# Patient Record
Sex: Female | Born: 1945 | Race: White | Hispanic: No | Smoking: Former smoker
Health system: Southern US, Community
[De-identification: ages and names within clinical notes are randomized; demographics above are authoritative.]

## PROBLEM LIST (undated history)

## (undated) DIAGNOSIS — E785 Hyperlipidemia, unspecified: Secondary | ICD-10-CM

## (undated) DIAGNOSIS — K5732 Diverticulitis of large intestine without perforation or abscess without bleeding: Secondary | ICD-10-CM

## (undated) DIAGNOSIS — G8929 Other chronic pain: Secondary | ICD-10-CM

## (undated) DIAGNOSIS — Z955 Presence of coronary angioplasty implant and graft: Secondary | ICD-10-CM

## (undated) DIAGNOSIS — Z8719 Personal history of other diseases of the digestive system: Secondary | ICD-10-CM

## (undated) DIAGNOSIS — F32A Depression, unspecified: Secondary | ICD-10-CM

## (undated) DIAGNOSIS — R9439 Abnormal result of other cardiovascular function study: Secondary | ICD-10-CM

## (undated) DIAGNOSIS — M51369 Other intervertebral disc degeneration, lumbar region without mention of lumbar back pain or lower extremity pain: Secondary | ICD-10-CM

## (undated) DIAGNOSIS — K649 Unspecified hemorrhoids: Secondary | ICD-10-CM

## (undated) DIAGNOSIS — K4090 Unilateral inguinal hernia, without obstruction or gangrene, not specified as recurrent: Secondary | ICD-10-CM

## (undated) DIAGNOSIS — F329 Major depressive disorder, single episode, unspecified: Secondary | ICD-10-CM

## (undated) DIAGNOSIS — D519 Vitamin B12 deficiency anemia, unspecified: Secondary | ICD-10-CM

## (undated) DIAGNOSIS — G4730 Sleep apnea, unspecified: Secondary | ICD-10-CM

## (undated) DIAGNOSIS — J40 Bronchitis, not specified as acute or chronic: Secondary | ICD-10-CM

## (undated) DIAGNOSIS — E039 Hypothyroidism, unspecified: Secondary | ICD-10-CM

## (undated) DIAGNOSIS — M659 Synovitis and tenosynovitis, unspecified: Secondary | ICD-10-CM

## (undated) DIAGNOSIS — M5030 Other cervical disc degeneration, unspecified cervical region: Secondary | ICD-10-CM

## (undated) DIAGNOSIS — M792 Neuralgia and neuritis, unspecified: Secondary | ICD-10-CM

## (undated) DIAGNOSIS — J42 Unspecified chronic bronchitis: Secondary | ICD-10-CM

## (undated) DIAGNOSIS — F419 Anxiety disorder, unspecified: Secondary | ICD-10-CM

## (undated) DIAGNOSIS — M542 Cervicalgia: Secondary | ICD-10-CM

## (undated) DIAGNOSIS — I10 Essential (primary) hypertension: Secondary | ICD-10-CM

## (undated) DIAGNOSIS — J189 Pneumonia, unspecified organism: Secondary | ICD-10-CM

## (undated) DIAGNOSIS — M549 Dorsalgia, unspecified: Secondary | ICD-10-CM

## (undated) DIAGNOSIS — K219 Gastro-esophageal reflux disease without esophagitis: Secondary | ICD-10-CM

## (undated) DIAGNOSIS — Z87440 Personal history of urinary (tract) infections: Secondary | ICD-10-CM

## (undated) DIAGNOSIS — M1990 Unspecified osteoarthritis, unspecified site: Secondary | ICD-10-CM

## (undated) DIAGNOSIS — M65969 Unspecified synovitis and tenosynovitis, unspecified lower leg: Secondary | ICD-10-CM

## (undated) DIAGNOSIS — M5136 Other intervertebral disc degeneration, lumbar region: Secondary | ICD-10-CM

## (undated) DIAGNOSIS — I2510 Atherosclerotic heart disease of native coronary artery without angina pectoris: Secondary | ICD-10-CM

## (undated) DIAGNOSIS — R32 Unspecified urinary incontinence: Secondary | ICD-10-CM

## (undated) HISTORY — PX: JOINT REPLACEMENT: SHX530

## (undated) HISTORY — PX: EYE SURGERY: SHX253

## (undated) HISTORY — PX: TOTAL KNEE ARTHROPLASTY: SHX125

## (undated) HISTORY — PX: COLON SURGERY: SHX602

## (undated) HISTORY — PX: KNEE ARTHROSCOPY: SUR90

## (undated) HISTORY — PX: KNEE CLOSED REDUCTION: SHX995

## (undated) HISTORY — PX: TOTAL KNEE REVISION: SHX996

---

## 1988-07-25 HISTORY — PX: OTHER SURGICAL HISTORY: SHX169

## 1991-07-26 HISTORY — PX: APPENDECTOMY: SHX54

## 2000-07-25 HISTORY — PX: HYSTEROSCOPY WITH D & C: SHX1775

## 2000-10-05 ENCOUNTER — Encounter: Admission: RE | Admit: 2000-10-05 | Discharge: 2000-10-05 | Payer: Self-pay | Admitting: Family Medicine

## 2000-10-05 ENCOUNTER — Encounter: Payer: Self-pay | Admitting: Family Medicine

## 2000-12-06 ENCOUNTER — Encounter: Admission: RE | Admit: 2000-12-06 | Discharge: 2000-12-19 | Payer: Self-pay | Admitting: Neurological Surgery

## 2001-02-13 ENCOUNTER — Ambulatory Visit (HOSPITAL_COMMUNITY): Admission: RE | Admit: 2001-02-13 | Discharge: 2001-02-13 | Payer: Self-pay | Admitting: Obstetrics and Gynecology

## 2001-02-13 ENCOUNTER — Encounter (INDEPENDENT_AMBULATORY_CARE_PROVIDER_SITE_OTHER): Payer: Self-pay | Admitting: Specialist

## 2002-07-05 ENCOUNTER — Encounter: Payer: Self-pay | Admitting: Family Medicine

## 2002-07-05 ENCOUNTER — Ambulatory Visit (HOSPITAL_COMMUNITY): Admission: RE | Admit: 2002-07-05 | Discharge: 2002-07-05 | Payer: Self-pay | Admitting: Family Medicine

## 2002-07-25 HISTORY — PX: CORONARY ANGIOPLASTY: SHX604

## 2002-08-28 ENCOUNTER — Encounter: Admission: RE | Admit: 2002-08-28 | Discharge: 2002-09-24 | Payer: Self-pay | Admitting: Orthopedic Surgery

## 2003-04-21 ENCOUNTER — Encounter: Payer: Self-pay | Admitting: Cardiology

## 2003-04-21 ENCOUNTER — Ambulatory Visit (HOSPITAL_COMMUNITY): Admission: RE | Admit: 2003-04-21 | Discharge: 2003-04-22 | Payer: Self-pay | Admitting: Cardiology

## 2003-06-11 ENCOUNTER — Ambulatory Visit (HOSPITAL_COMMUNITY): Admission: RE | Admit: 2003-06-11 | Discharge: 2003-06-11 | Payer: Self-pay | Admitting: Family Medicine

## 2003-07-26 DIAGNOSIS — I2510 Atherosclerotic heart disease of native coronary artery without angina pectoris: Secondary | ICD-10-CM

## 2003-07-26 HISTORY — PX: OTHER SURGICAL HISTORY: SHX169

## 2003-07-26 HISTORY — DX: Atherosclerotic heart disease of native coronary artery without angina pectoris: I25.10

## 2003-11-26 ENCOUNTER — Encounter: Admission: RE | Admit: 2003-11-26 | Discharge: 2003-12-23 | Payer: Self-pay | Admitting: Orthopedic Surgery

## 2004-05-25 ENCOUNTER — Inpatient Hospital Stay (HOSPITAL_COMMUNITY): Admission: RE | Admit: 2004-05-25 | Discharge: 2004-05-28 | Payer: Self-pay | Admitting: Orthopedic Surgery

## 2004-06-08 ENCOUNTER — Encounter: Admission: RE | Admit: 2004-06-08 | Discharge: 2004-08-17 | Payer: Self-pay | Admitting: Orthopedic Surgery

## 2004-11-29 ENCOUNTER — Ambulatory Visit: Payer: Self-pay | Admitting: Family Medicine

## 2004-12-06 ENCOUNTER — Encounter: Admission: RE | Admit: 2004-12-06 | Discharge: 2005-01-27 | Payer: Self-pay | Admitting: Orthopedic Surgery

## 2004-12-29 ENCOUNTER — Ambulatory Visit: Payer: Self-pay | Admitting: Family Medicine

## 2005-02-08 ENCOUNTER — Encounter: Admission: RE | Admit: 2005-02-08 | Discharge: 2005-05-09 | Payer: Self-pay | Admitting: Orthopedic Surgery

## 2005-03-23 ENCOUNTER — Ambulatory Visit: Payer: Self-pay | Admitting: Cardiology

## 2005-04-01 ENCOUNTER — Ambulatory Visit: Payer: Self-pay

## 2005-04-06 ENCOUNTER — Ambulatory Visit: Payer: Self-pay | Admitting: Family Medicine

## 2005-05-09 ENCOUNTER — Ambulatory Visit: Payer: Self-pay | Admitting: Family Medicine

## 2005-06-22 ENCOUNTER — Ambulatory Visit: Payer: Self-pay | Admitting: Family Medicine

## 2005-08-24 ENCOUNTER — Ambulatory Visit: Payer: Self-pay | Admitting: Family Medicine

## 2005-11-28 ENCOUNTER — Ambulatory Visit: Payer: Self-pay | Admitting: Family Medicine

## 2006-01-09 ENCOUNTER — Ambulatory Visit: Payer: Self-pay | Admitting: Family Medicine

## 2006-03-13 ENCOUNTER — Ambulatory Visit: Payer: Self-pay | Admitting: Family Medicine

## 2006-05-22 ENCOUNTER — Ambulatory Visit: Payer: Self-pay | Admitting: Family Medicine

## 2006-10-09 ENCOUNTER — Ambulatory Visit: Payer: Self-pay | Admitting: Family Medicine

## 2007-01-15 ENCOUNTER — Encounter: Admission: RE | Admit: 2007-01-15 | Discharge: 2007-02-16 | Payer: Self-pay | Admitting: Orthopedic Surgery

## 2007-05-09 ENCOUNTER — Ambulatory Visit: Payer: Self-pay | Admitting: Cardiology

## 2007-05-15 ENCOUNTER — Ambulatory Visit (HOSPITAL_COMMUNITY): Admission: RE | Admit: 2007-05-15 | Discharge: 2007-05-15 | Payer: Self-pay | Admitting: Surgery

## 2007-05-17 ENCOUNTER — Ambulatory Visit: Payer: Self-pay

## 2007-05-18 ENCOUNTER — Ambulatory Visit: Payer: Self-pay

## 2007-06-26 ENCOUNTER — Encounter: Admission: RE | Admit: 2007-06-26 | Discharge: 2007-06-26 | Payer: Self-pay | Admitting: Surgery

## 2007-06-28 ENCOUNTER — Ambulatory Visit (HOSPITAL_COMMUNITY): Admission: RE | Admit: 2007-06-28 | Discharge: 2007-06-28 | Payer: Self-pay | Admitting: Surgery

## 2009-01-06 DIAGNOSIS — Z8719 Personal history of other diseases of the digestive system: Secondary | ICD-10-CM | POA: Insufficient documentation

## 2009-01-06 DIAGNOSIS — E039 Hypothyroidism, unspecified: Secondary | ICD-10-CM | POA: Insufficient documentation

## 2009-01-06 DIAGNOSIS — K219 Gastro-esophageal reflux disease without esophagitis: Secondary | ICD-10-CM | POA: Insufficient documentation

## 2009-01-06 DIAGNOSIS — I2510 Atherosclerotic heart disease of native coronary artery without angina pectoris: Secondary | ICD-10-CM | POA: Insufficient documentation

## 2009-01-06 DIAGNOSIS — E785 Hyperlipidemia, unspecified: Secondary | ICD-10-CM | POA: Insufficient documentation

## 2009-01-06 DIAGNOSIS — R0602 Shortness of breath: Secondary | ICD-10-CM | POA: Insufficient documentation

## 2009-01-06 DIAGNOSIS — I10 Essential (primary) hypertension: Secondary | ICD-10-CM | POA: Insufficient documentation

## 2009-01-06 DIAGNOSIS — G4730 Sleep apnea, unspecified: Secondary | ICD-10-CM | POA: Insufficient documentation

## 2009-01-09 ENCOUNTER — Ambulatory Visit: Payer: Self-pay | Admitting: Internal Medicine

## 2009-01-09 ENCOUNTER — Encounter: Payer: Self-pay | Admitting: Nurse Practitioner

## 2009-01-09 DIAGNOSIS — M1990 Unspecified osteoarthritis, unspecified site: Secondary | ICD-10-CM | POA: Insufficient documentation

## 2009-01-21 ENCOUNTER — Telehealth (INDEPENDENT_AMBULATORY_CARE_PROVIDER_SITE_OTHER): Payer: Self-pay | Admitting: *Deleted

## 2009-01-22 ENCOUNTER — Encounter: Payer: Self-pay | Admitting: Cardiology

## 2009-01-22 ENCOUNTER — Ambulatory Visit: Payer: Self-pay

## 2009-01-22 DIAGNOSIS — R9439 Abnormal result of other cardiovascular function study: Secondary | ICD-10-CM

## 2009-01-22 HISTORY — DX: Abnormal result of other cardiovascular function study: R94.39

## 2009-01-27 ENCOUNTER — Ambulatory Visit: Payer: Self-pay

## 2009-02-04 ENCOUNTER — Telehealth: Payer: Self-pay | Admitting: Cardiology

## 2009-03-05 ENCOUNTER — Encounter: Admission: RE | Admit: 2009-03-05 | Discharge: 2009-03-05 | Payer: Self-pay | Admitting: Neurological Surgery

## 2009-03-09 ENCOUNTER — Telehealth (INDEPENDENT_AMBULATORY_CARE_PROVIDER_SITE_OTHER): Payer: Self-pay | Admitting: *Deleted

## 2009-03-10 ENCOUNTER — Telehealth (INDEPENDENT_AMBULATORY_CARE_PROVIDER_SITE_OTHER): Payer: Self-pay | Admitting: *Deleted

## 2009-03-13 ENCOUNTER — Inpatient Hospital Stay (HOSPITAL_COMMUNITY): Admission: RE | Admit: 2009-03-13 | Discharge: 2009-03-16 | Payer: Self-pay | Admitting: Orthopedic Surgery

## 2009-04-06 ENCOUNTER — Encounter: Admission: RE | Admit: 2009-04-06 | Discharge: 2009-07-05 | Payer: Self-pay | Admitting: Orthopedic Surgery

## 2009-04-28 ENCOUNTER — Encounter: Admission: RE | Admit: 2009-04-28 | Discharge: 2009-04-28 | Payer: Self-pay | Admitting: Orthopedic Surgery

## 2009-05-04 ENCOUNTER — Inpatient Hospital Stay (HOSPITAL_COMMUNITY): Admission: RE | Admit: 2009-05-04 | Discharge: 2009-05-05 | Payer: Self-pay | Admitting: Orthopedic Surgery

## 2009-05-04 HISTORY — PX: OTHER SURGICAL HISTORY: SHX169

## 2009-05-07 ENCOUNTER — Inpatient Hospital Stay (HOSPITAL_COMMUNITY): Admission: RE | Admit: 2009-05-07 | Discharge: 2009-05-12 | Payer: Self-pay | Admitting: Orthopedic Surgery

## 2009-09-30 ENCOUNTER — Inpatient Hospital Stay (HOSPITAL_COMMUNITY): Admission: RE | Admit: 2009-09-30 | Discharge: 2009-10-04 | Payer: Self-pay | Admitting: Orthopedic Surgery

## 2009-11-25 ENCOUNTER — Inpatient Hospital Stay (HOSPITAL_COMMUNITY): Admission: RE | Admit: 2009-11-25 | Discharge: 2009-11-28 | Payer: Self-pay | Admitting: Orthopedic Surgery

## 2009-12-16 ENCOUNTER — Encounter: Admission: RE | Admit: 2009-12-16 | Discharge: 2010-03-16 | Payer: Self-pay | Admitting: Orthopedic Surgery

## 2010-01-17 ENCOUNTER — Emergency Department (HOSPITAL_COMMUNITY): Admission: EM | Admit: 2010-01-17 | Discharge: 2010-01-17 | Payer: Self-pay | Admitting: Emergency Medicine

## 2010-01-22 ENCOUNTER — Inpatient Hospital Stay (HOSPITAL_COMMUNITY): Admission: AD | Admit: 2010-01-22 | Discharge: 2010-01-26 | Payer: Self-pay | Admitting: Orthopedic Surgery

## 2010-01-22 HISTORY — PX: KNEE ARTHROSCOPY W/ DEBRIDEMENT: SHX1867

## 2010-02-04 ENCOUNTER — Inpatient Hospital Stay (HOSPITAL_COMMUNITY): Admission: RE | Admit: 2010-02-04 | Discharge: 2010-02-08 | Payer: Self-pay | Admitting: Orthopedic Surgery

## 2010-02-04 HISTORY — PX: OTHER SURGICAL HISTORY: SHX169

## 2010-08-15 ENCOUNTER — Encounter: Payer: Self-pay | Admitting: Orthopedic Surgery

## 2010-10-09 LAB — CBC
HCT: 28.9 % — ABNORMAL LOW (ref 36.0–46.0)
MCH: 24.7 pg — ABNORMAL LOW (ref 26.0–34.0)
MCV: 74.7 fL — ABNORMAL LOW (ref 78.0–100.0)
Platelets: 586 10*3/uL — ABNORMAL HIGH (ref 150–400)
RDW: 16.7 % — ABNORMAL HIGH (ref 11.5–15.5)
WBC: 8.6 10*3/uL (ref 4.0–10.5)

## 2010-10-09 LAB — BASIC METABOLIC PANEL
BUN: 8 mg/dL (ref 6–23)
BUN: 9 mg/dL (ref 6–23)
Chloride: 102 mEq/L (ref 96–112)
Chloride: 103 mEq/L (ref 96–112)
Creatinine, Ser: 0.72 mg/dL (ref 0.4–1.2)
GFR calc Af Amer: >60 mL/min (ref >60)
GFR calc Af Amer: >60 mL/min (ref >60)
Glucose, Bld: 131 mg/dL — ABNORMAL HIGH (ref 70–99)
Potassium: 3.8 mEq/L (ref 3.5–5.1)
Potassium: 4.1 mEq/L (ref 3.5–5.1)
Sodium: 136 mEq/L (ref 135–145)
Sodium: 136 mEq/L (ref 135–145)

## 2010-10-10 LAB — URINALYSIS, ROUTINE W REFLEX MICROSCOPIC
Bilirubin Urine: NEGATIVE
Glucose, UA: NEGATIVE mg/dL
Hgb urine dipstick: NEGATIVE
Nitrite: NEGATIVE
Protein, ur: 30 mg/dL — AB
Specific Gravity, Urine: 1.021 (ref 1.005–1.030)
Specific Gravity, Urine: 1.041 — ABNORMAL HIGH (ref 1.005–1.030)
Urobilinogen, UA: 1 mg/dL (ref 0.0–1.0)
pH: 5.5 (ref 5.0–8.0)
pH: 6 (ref 5.0–8.0)

## 2010-10-10 LAB — BASIC METABOLIC PANEL
BUN: 13 mg/dL (ref 6–23)
BUN: 5 mg/dL — ABNORMAL LOW (ref 6–23)
CO2: 24 mEq/L (ref 19–32)
CO2: 25 mEq/L (ref 19–32)
CO2: 25 mEq/L (ref 19–32)
Calcium: 9.1 mg/dL (ref 8.4–10.5)
Calcium: 9.3 mg/dL (ref 8.4–10.5)
Chloride: 107 mEq/L (ref 96–112)
Chloride: 108 mEq/L (ref 96–112)
Creatinine, Ser: 0.73 mg/dL (ref 0.4–1.2)
GFR calc Af Amer: >60 mL/min (ref >60)
GFR calc Af Amer: >60 mL/min (ref >60)
GFR calc non Af Amer: >60 mL/min (ref >60)
Glucose, Bld: 116 mg/dL — ABNORMAL HIGH (ref 70–99)
Glucose, Bld: 119 mg/dL — ABNORMAL HIGH (ref 70–99)
Glucose, Bld: 114 mg/dL — ABNORMAL HIGH (ref 70–99)
Potassium: 3.7 mEq/L (ref 3.5–5.1)
Potassium: 4 mEq/L (ref 3.5–5.1)
Potassium: 4.1 mEq/L (ref 3.5–5.1)
Potassium: 4.2 mEq/L (ref 3.5–5.1)
Sodium: 137 mEq/L (ref 135–145)

## 2010-10-10 LAB — COMPREHENSIVE METABOLIC PANEL
ALT: 19 U/L (ref 0–35)
AST: 16 U/L (ref 0–37)
CO2: 26 mEq/L (ref 19–32)
Calcium: 9.2 mg/dL (ref 8.4–10.5)
Chloride: 105 mEq/L (ref 96–112)
GFR calc Af Amer: >60 mL/min (ref >60)
GFR calc non Af Amer: >60 mL/min (ref >60)
Glucose, Bld: 103 mg/dL — ABNORMAL HIGH (ref 70–99)
Sodium: 138 mEq/L (ref 135–145)
Total Bilirubin: 0.9 mg/dL (ref 0.3–1.2)

## 2010-10-10 LAB — DIFFERENTIAL
Basophils Absolute: 0.1 10*3/uL (ref 0.0–0.1)
Basophils Absolute: 0.1 10*3/uL (ref 0.0–0.1)
Basophils Relative: 1 % (ref 0–1)
Basophils Relative: 1 % (ref 0–1)
Lymphocytes Relative: 20 % (ref 12–46)
Monocytes Absolute: 0.9 10*3/uL (ref 0.1–1.0)
Monocytes Relative: 11 % (ref 3–12)
Neutro Abs: 6.7 10*3/uL (ref 1.7–7.7)
Neutro Abs: 7.5 10*3/uL (ref 1.7–7.7)
Neutrophils Relative %: 71 % (ref 43–77)
Neutrophils Relative %: 65 % (ref 43–77)

## 2010-10-10 LAB — GRAM STAIN

## 2010-10-10 LAB — ANAEROBIC CULTURE

## 2010-10-10 LAB — CBC
HCT: 27.5 % — ABNORMAL LOW (ref 36.0–46.0)
HCT: 31.4 % — ABNORMAL LOW (ref 36.0–46.0)
HCT: 33 % — ABNORMAL LOW (ref 36.0–46.0)
Hemoglobin: 10.3 g/dL — ABNORMAL LOW (ref 12.0–15.0)
Hemoglobin: 10.3 g/dL — ABNORMAL LOW (ref 12.0–15.0)
Hemoglobin: 9 g/dL — ABNORMAL LOW (ref 12.0–15.0)
Hemoglobin: 11 g/dL — ABNORMAL LOW (ref 12.0–15.0)
MCH: 24.5 pg — ABNORMAL LOW (ref 26.0–34.0)
MCH: 25.1 pg — ABNORMAL LOW (ref 26.0–34.0)
MCHC: 32.8 g/dL (ref 30.0–36.0)
MCHC: 33.5 g/dL (ref 30.0–36.0)
MCHC: 33.3 g/dL (ref 30.0–36.0)
MCV: 76.6 fL — ABNORMAL LOW (ref 78.0–100.0)
RBC: 3.59 MIL/uL — ABNORMAL LOW (ref 3.87–5.11)
RBC: 4.2 MIL/uL (ref 3.87–5.11)
WBC: 7.4 10*3/uL (ref 4.0–10.5)
WBC: 11.5 10*3/uL — ABNORMAL HIGH (ref 4.0–10.5)

## 2010-10-10 LAB — URINE MICROSCOPIC-ADD ON

## 2010-10-10 LAB — URINE CULTURE: Special Requests: NEGATIVE

## 2010-10-10 LAB — TYPE AND SCREEN: ABO/RH(D): O POS

## 2010-10-10 LAB — VANCOMYCIN, TROUGH: Vancomycin Tr: 18.1 ug/mL (ref 10.0–20.0)

## 2010-10-10 LAB — WOUND CULTURE: Culture: NO GROWTH

## 2010-10-10 LAB — SYNOVIAL CELL COUNT + DIFF, W/ CRYSTALS: Eosinophils-Synovial: 0 % (ref 0–1)

## 2010-10-10 LAB — PROTIME-INR
INR: 1.15 (ref 0.00–1.49)
Prothrombin Time: 14.2 seconds (ref 11.6–15.2)

## 2010-10-10 LAB — SURGICAL PCR SCREEN: MRSA, PCR: NEGATIVE

## 2010-10-10 LAB — BODY FLUID CULTURE

## 2010-10-10 LAB — MRSA PCR SCREENING: MRSA by PCR: NEGATIVE

## 2010-10-12 LAB — URINALYSIS, ROUTINE W REFLEX MICROSCOPIC
Bilirubin Urine: NEGATIVE
Glucose, UA: NEGATIVE mg/dL
Ketones, ur: NEGATIVE mg/dL
Ketones, ur: NEGATIVE mg/dL
Nitrite: NEGATIVE
Nitrite: NEGATIVE
Protein, ur: NEGATIVE mg/dL
Specific Gravity, Urine: 1.006 (ref 1.005–1.030)
pH: 6.5 (ref 5.0–8.0)
pH: 5.5 (ref 5.0–8.0)

## 2010-10-12 LAB — CBC
HCT: 29.8 % — ABNORMAL LOW (ref 36.0–46.0)
HCT: 30.5 % — ABNORMAL LOW (ref 36.0–46.0)
HCT: 37.5 % (ref 36.0–46.0)
Hemoglobin: 10.1 g/dL — ABNORMAL LOW (ref 12.0–15.0)
Hemoglobin: 12.5 g/dL (ref 12.0–15.0)
MCHC: 33 g/dL (ref 30.0–36.0)
MCHC: 34 g/dL (ref 30.0–36.0)
MCHC: 33.2 g/dL (ref 30.0–36.0)
MCV: 83.1 fL (ref 78.0–100.0)
MCV: 83.3 fL (ref 78.0–100.0)
Platelets: 224 10*3/uL (ref 150–400)
RBC: 3.25 MIL/uL — ABNORMAL LOW (ref 3.87–5.11)
RBC: 3.67 MIL/uL — ABNORMAL LOW (ref 3.87–5.11)
RBC: 4.5 MIL/uL (ref 3.87–5.11)
RDW: 15.2 % (ref 11.5–15.5)
RDW: 15.2 % (ref 11.5–15.5)
RDW: 15.4 % (ref 11.5–15.5)

## 2010-10-12 LAB — PROTIME-INR
INR: 1.11 (ref 0.00–1.49)
INR: 1.66 — ABNORMAL HIGH (ref 0.00–1.49)
Prothrombin Time: 15.9 seconds — ABNORMAL HIGH (ref 11.6–15.2)
Prothrombin Time: 19.5 seconds — ABNORMAL HIGH (ref 11.6–15.2)
Prothrombin Time: 13.2 seconds (ref 11.6–15.2)

## 2010-10-12 LAB — COMPREHENSIVE METABOLIC PANEL
AST: 21 U/L (ref 0–37)
BUN: 8 mg/dL (ref 6–23)
CO2: 25 mEq/L (ref 19–32)
Calcium: 9.1 mg/dL (ref 8.4–10.5)
Chloride: 107 mEq/L (ref 96–112)
Creatinine, Ser: 0.77 mg/dL (ref 0.4–1.2)
GFR calc non Af Amer: >60 mL/min (ref >60)
Glucose, Bld: 102 mg/dL — ABNORMAL HIGH (ref 70–99)
Total Bilirubin: 0.5 mg/dL (ref 0.3–1.2)

## 2010-10-12 LAB — BASIC METABOLIC PANEL
BUN: 4 mg/dL — ABNORMAL LOW (ref 6–23)
CO2: 27 mEq/L (ref 19–32)
CO2: 27 mEq/L (ref 19–32)
Calcium: 8.6 mg/dL (ref 8.4–10.5)
Chloride: 103 mEq/L (ref 96–112)
Creatinine, Ser: 0.7 mg/dL (ref 0.4–1.2)
Creatinine, Ser: 0.75 mg/dL (ref 0.4–1.2)
GFR calc Af Amer: >60 mL/min (ref >60)
GFR calc Af Amer: >60 mL/min (ref >60)
GFR calc non Af Amer: >60 mL/min (ref >60)
Glucose, Bld: 129 mg/dL — ABNORMAL HIGH (ref 70–99)
Glucose, Bld: 150 mg/dL — ABNORMAL HIGH (ref 70–99)
Glucose, Bld: 178 mg/dL — ABNORMAL HIGH (ref 70–99)
Potassium: 5.1 mEq/L (ref 3.5–5.1)
Sodium: 134 mEq/L — ABNORMAL LOW (ref 135–145)

## 2010-10-12 LAB — URINE MICROSCOPIC-ADD ON

## 2010-10-12 LAB — TYPE AND SCREEN
ABO/RH(D): O POS
Antibody Screen: NEGATIVE

## 2010-10-12 LAB — APTT: aPTT: 28 seconds (ref 24–37)

## 2010-10-12 LAB — URINE CULTURE

## 2010-10-18 LAB — TYPE AND SCREEN
ABO/RH(D): O POS
Antibody Screen: NEGATIVE

## 2010-10-18 LAB — CBC
HCT: 31.3 % — ABNORMAL LOW (ref 36.0–46.0)
HCT: 32.3 % — ABNORMAL LOW (ref 36.0–46.0)
HCT: 32.8 % — ABNORMAL LOW (ref 36.0–46.0)
HCT: 40.4 % (ref 36.0–46.0)
Hemoglobin: 10.6 g/dL — ABNORMAL LOW (ref 12.0–15.0)
Hemoglobin: 13.5 g/dL (ref 12.0–15.0)
MCHC: 32.8 g/dL (ref 30.0–36.0)
MCHC: 33.3 g/dL (ref 30.0–36.0)
MCHC: 33.4 g/dL (ref 30.0–36.0)
MCV: 84.4 fL (ref 78.0–100.0)
MCV: 85.1 fL (ref 78.0–100.0)
Platelets: 232 10*3/uL (ref 150–400)
Platelets: 243 10*3/uL (ref 150–400)
RBC: 4.8 MIL/uL (ref 3.87–5.11)
RDW: 15.7 % — ABNORMAL HIGH (ref 11.5–15.5)
RDW: 15.9 % — ABNORMAL HIGH (ref 11.5–15.5)
WBC: 8.8 10*3/uL (ref 4.0–10.5)

## 2010-10-18 LAB — BASIC METABOLIC PANEL
BUN: 12 mg/dL (ref 6–23)
CO2: 31 mEq/L (ref 19–32)
Calcium: 8.6 mg/dL (ref 8.4–10.5)
Chloride: 103 mEq/L (ref 96–112)
Glucose, Bld: 132 mg/dL — ABNORMAL HIGH (ref 70–99)
Glucose, Bld: 140 mg/dL — ABNORMAL HIGH (ref 70–99)
Potassium: 4.1 mEq/L (ref 3.5–5.1)
Potassium: 4.5 mEq/L (ref 3.5–5.1)
Sodium: 138 mEq/L (ref 135–145)

## 2010-10-18 LAB — COMPREHENSIVE METABOLIC PANEL
ALT: 31 U/L (ref 0–35)
Alkaline Phosphatase: 97 U/L (ref 39–117)
BUN: 12 mg/dL (ref 6–23)
CO2: 29 mEq/L (ref 19–32)
GFR calc non Af Amer: >60 mL/min (ref >60)
Glucose, Bld: 90 mg/dL (ref 70–99)
Potassium: 4.1 mEq/L (ref 3.5–5.1)
Sodium: 142 mEq/L (ref 135–145)

## 2010-10-18 LAB — URINALYSIS, ROUTINE W REFLEX MICROSCOPIC
Bilirubin Urine: NEGATIVE
Glucose, UA: NEGATIVE mg/dL
Hgb urine dipstick: NEGATIVE
Ketones, ur: NEGATIVE mg/dL
Protein, ur: NEGATIVE mg/dL
Urobilinogen, UA: 1 mg/dL (ref 0.0–1.0)

## 2010-10-18 LAB — URINE MICROSCOPIC-ADD ON

## 2010-10-18 LAB — PROTIME-INR
INR: 1 (ref 0.00–1.49)
Prothrombin Time: 13.1 seconds (ref 11.6–15.2)

## 2010-10-28 LAB — BASIC METABOLIC PANEL
BUN: 4 mg/dL — ABNORMAL LOW (ref 6–23)
BUN: 6 mg/dL (ref 6–23)
Chloride: 106 mEq/L (ref 96–112)
Chloride: 104 mEq/L (ref 96–112)
Creatinine, Ser: 0.71 mg/dL (ref 0.4–1.2)
GFR calc Af Amer: >60 mL/min (ref >60)
GFR calc non Af Amer: >60 mL/min (ref >60)
Glucose, Bld: 131 mg/dL — ABNORMAL HIGH (ref 70–99)
Potassium: 3.8 mEq/L (ref 3.5–5.1)
Potassium: 4.4 mEq/L (ref 3.5–5.1)

## 2010-10-28 LAB — COMPREHENSIVE METABOLIC PANEL
ALT: 17 U/L (ref 0–35)
ALT: 26 U/L (ref 0–35)
AST: 18 U/L (ref 0–37)
AST: 24 U/L (ref 0–37)
Alkaline Phosphatase: 87 U/L (ref 39–117)
Alkaline Phosphatase: 89 U/L (ref 39–117)
CO2: 27 mEq/L (ref 19–32)
CO2: 29 mEq/L (ref 19–32)
Calcium: 8.8 mg/dL (ref 8.4–10.5)
Chloride: 105 mEq/L (ref 96–112)
Chloride: 105 mEq/L (ref 96–112)
GFR calc Af Amer: >60 mL/min (ref >60)
GFR calc Af Amer: >60 mL/min (ref >60)
GFR calc non Af Amer: >60 mL/min (ref >60)
GFR calc non Af Amer: >60 mL/min (ref >60)
Glucose, Bld: 126 mg/dL — ABNORMAL HIGH (ref 70–99)
Potassium: 4 mEq/L (ref 3.5–5.1)
Potassium: 4.1 mEq/L (ref 3.5–5.1)
Sodium: 139 mEq/L (ref 135–145)
Sodium: 140 mEq/L (ref 135–145)

## 2010-10-28 LAB — WOUND CULTURE: Culture: NO GROWTH

## 2010-10-28 LAB — URINE CULTURE

## 2010-10-28 LAB — URINALYSIS, MICROSCOPIC ONLY
Bilirubin Urine: NEGATIVE
Glucose, UA: NEGATIVE mg/dL
Ketones, ur: NEGATIVE mg/dL
Nitrite: POSITIVE — AB
Nitrite: POSITIVE — AB
Nitrite: NEGATIVE
Specific Gravity, Urine: 1.018 (ref 1.005–1.030)
Specific Gravity, Urine: 1.019 (ref 1.005–1.030)
Urobilinogen, UA: 1 mg/dL (ref 0.0–1.0)
Urobilinogen, UA: 2 mg/dL — ABNORMAL HIGH (ref 0.0–1.0)
pH: 6.5 (ref 5.0–8.0)
pH: 7.5 (ref 5.0–8.0)

## 2010-10-28 LAB — CBC
HCT: 31.3 % — ABNORMAL LOW (ref 36.0–46.0)
HCT: 31.2 % — ABNORMAL LOW (ref 36.0–46.0)
Hemoglobin: 10.4 g/dL — ABNORMAL LOW (ref 12.0–15.0)
Hemoglobin: 13.1 g/dL (ref 12.0–15.0)
MCHC: 34.3 g/dL (ref 30.0–36.0)
MCV: 86.3 fL (ref 78.0–100.0)
MCV: 87.1 fL (ref 78.0–100.0)
Platelets: 330 10*3/uL (ref 150–400)
Platelets: 250 10*3/uL (ref 150–400)
RBC: 3.53 MIL/uL — ABNORMAL LOW (ref 3.87–5.11)
RBC: 3.59 MIL/uL — ABNORMAL LOW (ref 3.87–5.11)
RBC: 4.45 MIL/uL (ref 3.87–5.11)
RDW: 14.6 % (ref 11.5–15.5)
WBC: 6.6 10*3/uL (ref 4.0–10.5)
WBC: 7 10*3/uL (ref 4.0–10.5)
WBC: 7.9 10*3/uL (ref 4.0–10.5)

## 2010-10-28 LAB — TYPE AND SCREEN
ABO/RH(D): O POS
Antibody Screen: NEGATIVE

## 2010-10-28 LAB — PROTIME-INR: Prothrombin Time: 12.2 seconds (ref 11.6–15.2)

## 2010-10-30 LAB — BASIC METABOLIC PANEL
BUN: 12 mg/dL (ref 6–23)
CO2: 24 mEq/L (ref 19–32)
CO2: 25 mEq/L (ref 19–32)
CO2: 26 mEq/L (ref 19–32)
Calcium: 8.2 mg/dL — ABNORMAL LOW (ref 8.4–10.5)
Calcium: 8.3 mg/dL — ABNORMAL LOW (ref 8.4–10.5)
Calcium: 8.3 mg/dL — ABNORMAL LOW (ref 8.4–10.5)
Chloride: 102 mEq/L (ref 96–112)
Creatinine, Ser: 0.77 mg/dL (ref 0.4–1.2)
Creatinine, Ser: 0.82 mg/dL (ref 0.4–1.2)
GFR calc Af Amer: >60 mL/min (ref >60)
GFR calc Af Amer: >60 mL/min (ref >60)
GFR calc Af Amer: >60 mL/min (ref >60)
GFR calc non Af Amer: >60 mL/min (ref >60)
GFR calc non Af Amer: >60 mL/min (ref >60)
Glucose, Bld: 135 mg/dL — ABNORMAL HIGH (ref 70–99)
Potassium: 3.9 mEq/L (ref 3.5–5.1)
Sodium: 136 mEq/L (ref 135–145)
Sodium: 136 mEq/L (ref 135–145)

## 2010-10-30 LAB — CBC
HCT: 25.4 % — ABNORMAL LOW (ref 36.0–46.0)
HCT: 43.2 % (ref 36.0–46.0)
Hemoglobin: 8.9 g/dL — ABNORMAL LOW (ref 12.0–15.0)
Hemoglobin: 9.1 g/dL — ABNORMAL LOW (ref 12.0–15.0)
MCHC: 35 g/dL (ref 30.0–36.0)
MCHC: 35.1 g/dL (ref 30.0–36.0)
MCV: 89 fL (ref 78.0–100.0)
MCV: 90.5 fL (ref 78.0–100.0)
Platelets: 271 10*3/uL (ref 150–400)
RBC: 2.79 MIL/uL — ABNORMAL LOW (ref 3.87–5.11)
RBC: 2.8 MIL/uL — ABNORMAL LOW (ref 3.87–5.11)
RBC: 3.42 MIL/uL — ABNORMAL LOW (ref 3.87–5.11)
RBC: 4.85 MIL/uL (ref 3.87–5.11)
RDW: 14.2 % (ref 11.5–15.5)
RDW: 14.6 % (ref 11.5–15.5)
WBC: 6.9 10*3/uL (ref 4.0–10.5)

## 2010-10-30 LAB — COMPREHENSIVE METABOLIC PANEL
AST: 35 U/L (ref 0–37)
Albumin: 3.8 g/dL (ref 3.5–5.2)
Alkaline Phosphatase: 85 U/L (ref 39–117)
CO2: 29 mEq/L (ref 19–32)
Chloride: 104 mEq/L (ref 96–112)
Creatinine, Ser: 0.82 mg/dL (ref 0.4–1.2)
GFR calc Af Amer: >60 mL/min (ref >60)
GFR calc non Af Amer: >60 mL/min (ref >60)
Potassium: 4.9 mEq/L (ref 3.5–5.1)
Total Bilirubin: 1.4 mg/dL — ABNORMAL HIGH (ref 0.3–1.2)

## 2010-10-30 LAB — URINALYSIS, ROUTINE W REFLEX MICROSCOPIC
Nitrite: NEGATIVE
Specific Gravity, Urine: 1.022 (ref 1.005–1.030)
Urobilinogen, UA: 2 mg/dL — ABNORMAL HIGH (ref 0.0–1.0)
pH: 7 (ref 5.0–8.0)

## 2010-10-30 LAB — PROTIME-INR
INR: 1.1 (ref 0.00–1.49)
Prothrombin Time: 12.7 seconds (ref 11.6–15.2)

## 2010-10-30 LAB — DIFFERENTIAL
Basophils Absolute: 0.1 10*3/uL (ref 0.0–0.1)
Basophils Relative: 1 % (ref 0–1)
Eosinophils Absolute: 0.3 10*3/uL (ref 0.0–0.7)
Eosinophils Relative: 5 % (ref 0–5)
Lymphocytes Relative: 29 % (ref 12–46)
Monocytes Absolute: 0.7 10*3/uL (ref 0.1–1.0)

## 2010-10-30 LAB — URINE MICROSCOPIC-ADD ON

## 2010-10-30 LAB — URINE CULTURE: Colony Count: 60000

## 2010-10-30 LAB — ABO/RH: ABO/RH(D): O POS

## 2010-12-07 NOTE — Op Note (Signed)
NAME:  Andrea Norton, Andrea Norton NO.:  [id number]   MEDICAL RECORD NO.:  [id number]          PATIENT TYPE:  INP   LOCATION:  5006                         FACILITY:  [facility]   PHYSICIAN:  Dyke Brackett, M.D.    DATE OF BIRTH:  1946-01-19   DATE OF PROCEDURE:  03/13/2009  DATE OF DISCHARGE:                               OPERATIVE REPORT   INDICATION:  A 65 year old with morbid obesity with intractable right  knee pain, end-stage arthritis thought to be amenable to inpatient  hospitalization for total knee replacement.  No new record due to  technical difficulty, significantly increased secondary to the patient's  size and increase BMI.   PREOPERATIVE DIAGNOSIS:  Severe osteoarthritis with morbid obesity for  right knee.   POSTOPERATIVE DIAGNOSIS:  Severe osteoarthritis with morbid obesity for  right knee.   OPERATION:  Right total knee replacement (LCS standard femur, standard  patella, MBT revision tray with 20-mm bearing and size 3 tibia).   SURGEON:  Dyke Brackett, MD   ASSISTANT:  Joslyn Devon. Smith, PA   TOURNIQUET TIME:  1 hour and 30 minutes.   DESCRIPTION OF PROCEDURE:  After sterile prep and drape, exsanguination  of 400 mm.  A straight skin incision of medial parapatellar approach to  the knee made with identification of most diseased medial compartment,  cutting about 3-4 mm below that with the DePuy external guide followed  by anterior-posterior femoral cut and a distal 4 degrees valgus cut with  flexion gap, mentioned the extension gap eventually at 20 mm.  There was  resection of the excess menisci, ACL, PCL followed by the finishing  guide with the keel holes for the prosthesis.  The attention was next  directed at the tibia size to be 3 with a standard on the femur being in  the size there.  Keel hole was cut for the revision tray due to the  patient's large size.  Keel punch was used for that, trial reduction  carried out with tibia and femur with a 20-mm  bearing and the patella  cut leaving about 13 mm of native patella for a three peg patella metal  back.  The trial was carried out again with all components, excellent  stability, full extension, slightly positive drawer, good stability,  good ligament balancing.   Trial was removed.  Final components were inserted with the cement in  the doughy state, two batches tibia followed by femur, patella.  Trial  bearing was next placed and cement allowed to harden.  Trial bearing  removed when the  cement was hard, excess cement was removed, also the tourniquet was  released with the trial bearing out.  No excessive bleeding was noted  from the posterior aspect of the knee.  Final bearing was inserted and  closure was effected with interrupted Ethibond, 2-0 Vicryl, and skin  clips.  Taken to recovery room in stable condition.      Dyke Brackett, M.D.  Electronically Signed     WDC/MEDQ  D:  03/13/2009  T:  03/14/2009  Job:  [id number]

## 2010-12-07 NOTE — Assessment & Plan Note (Signed)
[facility]                            CARDIOLOGY OFFICE NOTE   NAME:Andrea Norton, Andrea Norton                         MRN:          [id number]  DATE:05/09/2007                            DOB:          August 13, 1945    REFERRING PHYSICIAN:  Thornton Park. Daphine Deutscher, MD   PRIMARY CARE PHYSICIAN:  Delaney Meigs, M.D.   REASON FOR CONSULTATION:  Preoperative evaluation of patient with  coronary disease and dyspnea.   HISTORY OF PRESENT ILLNESS:  The patient is 65 years old. It has been a  couple of years since her last visit. She is being evaluated for a Lap-  Band bariatric surgery procedure as well as ventral hernia repair. She  has a past cardiac history as described below. She has not had any new  cardiac evaluations since 2006, when she had an EF of 59% on Myoview  with inferior thinning felt to be possible bowel artifact.   The patient has quite a limited functional status. She states that she  cannot walk more than 50 years without dyspnea. She is limited by knee  pain and her weight. She thinks the shortness of breath is getting  progressive. She does not describe PND or orthopnea. She has sleep apnea  and does not breath well if she does not use her CPAP. She is not  describing chest pressure, neck or arm discomfort. She is not describing  palpitations, pre-syncope or syncope.   PAST MEDICAL HISTORY:  1. Coronary artery disease, status post TAXUS stenting of a 99% left      anterior descending artery stenosis.  2. Hypertension.  3. Morbid obesity.  4. Gastroesophageal reflux disease.  5. Hypothyroidism.  6. Dyslipidemia.  7. Diverticulitis.  8. Sleep apnea.   PAST SURGICAL HISTORY:  1. Bilateral knee arthroscopies.  2. Total knee replacement.  3. Laparotomy and colectomy for diverticulitis.   ALLERGIES:  None.   MEDICATIONS:  1. Cymbalta 60 mg daily.  2. Synthroid 0.125 mg daily.  3. Altoprev 40 mg daily.  4. Klor-Con 20 mEq daily.  5. Zetia  10 mg daily.  6. Hydrochlorothiazide 25 mg daily.  7. Toprol 25 mg daily.  8. Aspirin.  9. B-12 injections.   SOCIAL HISTORY:  The patient quit smoking in 2003. She rarely drinks  caffeine. She does not drink alcohol.   FAMILY HISTORY:  Is noncontributory for early coronary artery disease.  Her father died of lung cancer at age 80.   REVIEW OF SYSTEMS:  As stated in the HPI and otherwise positive for  diaphoresis chronically which has been a problem in the past as well.   PHYSICAL EXAMINATION:  The patient is in no distress. Blood pressure  128/88, heart rate 79 and regular, weight is 259 pounds.  HEENT: Eyelids unremarkable. Pupils equal, round, and reactive to light.  Fundi not visualized. Oral mucosa is unremarkable.  NECK: No jugular venous distention, waveform within normal limits.  Carotid upstroke brisk and symmetrical. No bruits, no thyromegaly.  LYMPHATICS: Compromised by her weight, but no obvious cervical, axillary  or inguinal adenopathy.  LUNGS:  Clear to auscultation bilaterally.  BACK: No costovertebral angle tenderness.  CHEST: Unremarkable.  HEART: PMI not displaced or sustained. S1, S2 within normal limits. No  S3. No S4. No clicks, rub or murmurs.  ABDOMEN: Obese, well-healed midline surgical scar. Positive bowel  sounds, normal in frequency and pitch. No bruits. No rebounds. No  guarding. No midline pulsatile mass. No hepatomegaly, splenomegaly.  SKIN: No rashes, no nodules.  EXTREMITIES: 2+ pulses, no edema.   EKG: Sinus rhythm, rate 79, axis within normal limits. QT prolongation,  nonspecific T-wave flattening.   ASSESSMENT/PLAN:  1. Coronary disease. The patient does have a history of coronary      disease and significant risk factors. She has a very low functional      status. She has ongoing dyspnea which could be an anginal      equivalent. Therefore, based on ACC/H.A. guidelines, the patient      should be referred for stress perfusion imaging prior  to surgery. I      will go ahead and arrange this. She would not be able to walk on a      treadmill so she will have an adenosine Myoview. Further evaluation      will be based on this. She should continue with risk reduction. I      will take this opportunity to increase her beta-blocker      preoperatively to 50 mg once a day with a target heart rate of 60      for risk reduction.  2. Obesity. She is going to have the Lap surgery and have this      managed.  3. Hypertension. Blood pressure is controlled and she will continue on      medications as listed.  4. Dyslipidemia. Per Dr.  Lysbeth Galas. Goal will be an LDL of less than 100      and HDL greater than 40.     Rollene Rotunda, MD, [facility]  Electronically Signed    JH/MedQ  DD: 05/09/2007  DT: 05/09/2007  Job #: [id number]   cc:   Delaney Meigs, M.D.  Thornton Park Daphine Deutscher, MD

## 2010-12-07 NOTE — Discharge Summary (Signed)
NAMESHULAMIT, DONOFRIO                ACCOUNT NO.:  [id number]   MEDICAL RECORD NO.:  [id number]          PATIENT TYPE:  INP   LOCATION:  5006                         FACILITY:  [facility]   PHYSICIAN:  Dyke Brackett, M.D.    DATE OF BIRTH:  08-23-1945   DATE OF ADMISSION:  03/13/2009  DATE OF DISCHARGE:  03/16/2009                               DISCHARGE SUMMARY   ADMISSION DIAGNOSIS:  Right knee osteoarthritis.   DISCHARGE FINAL DIAGNOSIS:  Status post right total knee replacement for  right knee osteoarthritis.   SURGEON:  Dyke Brackett, MD   PHYSICIAN ASSISTANT:  Sharol Given, [place]   PROCEDURAL NOTE:  The patient presented to the hospital for right knee  osteoarthritis.  Plans for right total knee replacement on March 13, 2009.  Procedure was performed in the operating room.  No complications.  The patient was transferred to PACU in stable condition.   HOSPITAL COURSE:  Ms. Wilshire is a 66 year old female transferred from the  PACU to 5000 orthopedic floor following a right total knee replacement  in stable condition.  Postoperative day #1 would have been March 14, 2009, the patient was doing well, afebrile.  Hemoglobin was at 10.8.  Starting physical therapy first postoperative day.  Postoperative day #2  would have been March 15, 2009, Foley was discontinued.  Hemoglobin was  8.95.  Labs and vitals stable.  Hemovac was pulled, 0-50 on CPM.  Incision was benign.  Plan to be discharged home on following day.  The  patient doing well with physical therapy, 95 feet performing with a  rolling walker, 0-30 on CPM; and on postoperative day #3 would have been  on March 16, 2009, the patient had some minimal abdominal pain.  No  bowel movement so far.  Hemoglobin at 9.1.  Vital signs stable,  afebrile.  Pulse was at 76, it was noted per chart.  CPM doing 0-60.  Plan was to discharge the patient home assuming acute abdominal series  was negative, which was done and negative, there were  no acute findings  on it, so the patient was discharged home on March 16, 2009.   ASSESSMENT AND PLAN:  Mr. Gunnarson is a 65 year old female status post  right total knee replacement, discharged home on March 25, 2009.  Stable condition, regular diet, 50% weightbearing, 0-90 CPM, increase 3-  5 degrees per day 6-8 hours a day.  Plan for followup in Dr. Candise Bowens  office 14 days postoperative for staple removal.   DISCHARGE MEDICATIONS:  1. Percocet 5/325 one to two tablets p.o. q.4-6 h. p.r.n. pain.  2. Robaxin 500 mg one tablet p.o. q.6-8 h. p.r.n. pain.  3. Lovenox 40 mg subcu at 8 a.m. each day for total of 15 days      postoperatively.  4. Potassium chloride 20 mEq daily as a home medication.  5. Simvastatin 40 mg daily, home medication.  6. Toprol-XL 25 mg daily, home medications.  7. Cymbalta 60 mg daily.  8. Synthroid 125 mcg as a home medication daily.  9. Furosemide 20 mg daily.  10.Aspirin 81 mg daily.  11.B12 injection each month.      Sharol Given, PA      Dyke Brackett, M.D.  Electronically Signed    JBS/MEDQ  D:  03/25/2009  T:  03/25/2009  Job:  [id number]

## 2010-12-10 NOTE — H&P (Signed)
NAMEKASMIRA, CACIOPPO                ACCOUNT NO.:  [id number]   MEDICAL RECORD NO.:  [id number]          PATIENT TYPE:  INP   LOCATION:  NA                           FACILITY:  [facility]   PHYSICIAN:  Rodney A. Mortenson, M.D.DATE OF BIRTH:  [birth year]   DATE OF ADMISSION:  05/25/2004  DATE OF DISCHARGE:                                HISTORY & PHYSICAL   CHIEF COMPLAINT:  Left knee pain.   HISTORY OF PRESENT ILLNESS:  Ms. Bartosiewicz is a 65 year old, white female with  bilateral knee pain, left greater than right.  Left knee pain has been  present for years, but has become progressively worse over the last 6  months.  The patient did undergo a left knee arthroscopy on November 04, 2003.  At that time, she had tear of the posterior horn and medial meniscus with  early arthritis.  Left knee pain is described as constant pain in the  compartment, dull, aching in nature.  There is some stabbing pain at times.  Mechanical symptoms positive for catching.  Pain worse with stairs, bending  over and walking.  She uses a cane at time to ambulate.  X-rays showed  medial compartment collapse of patella femoral component with osteophyte.  She is to undergo a left total knee on May 25, 2004, by Dr. Thereasa Distance A.  Mortenson at [facility].   ALLERGIES:  No known drug allergies.   MEDICATIONS:  1.  Estrace 5 mg one q.d.  2.  Provera 10 mg each month 16th through 25th.  3.  Toprol 50 mg q.d.  4.  Synthroid 125 mcg q.d.  5.  Lovastatin 40 mg one q.d.  6.  Aspirin 81 mg q.d.  7.  B12 injections 1 cc each month.   PAST MEDICAL HISTORY:  1.  Hypertension.  2.  Hypothyroidism.  3.  Coronary artery disease with stent placement.  4.  Vitamin B12 deficiency.  5.  History of anemia.  6.  Dyslipidemia.  7.  Degenerative disc disease of cervical spine.  8.  Postmenopausal.  9.  Diverticulitis with colon resection in February 2005.   PAST SURGICAL HISTORY:  1.  Appendectomy in 1996.  2.  Uterine  polyp removed in 2002.  3.  Right knee arthroscopy in 2003.  4.  Coronary stenting in 2004, Dr. Antoine Poche.  5.  Diverticulitis with colon resection in February 2005.  6.  Left knee arthroscopy in 2005.  7.  Left foot surgery in 1995.   SOCIAL HISTORY:  The patient stopped smoking in August 2004.  She denies any  alcohol use.  Primary care physician is Dr. Joette Catching, phone number [phone].  Cardiologist, Dr. Antoine Poche at [facility].  The patient lives  in a one-story home with four steps to usual entrance.  The patient is out  on Disability.   FAMILY HISTORY:  Mother alive at age 61 with COPD.  History of DVT.  Father  deceased at age 28 with lung cancer.  Two brothers, one age 35 with history  of stroke, the other 1 with a history  of MI.  She has two sisters one age  53 and the other age 53.  Neither have any medical problems.   REVIEW OF SYSTEMS:  CARDIOPULMONARY:  The patient denies any chest pain,  orthopnea or PND.  She does have shortness of breath with exertion and feels  that this is due to deconditioning.  HEENT:  She has neck pain with  degenerative disc disease.  She wears glasses for reading.  GASTROINTESTINAL:  Denies symptoms.  GENITOURINARY:  No symptoms.  Review of  systems is otherwise negative.  She does not have a living will or a power  of attorney.   PHYSICAL EXAMINATION:  GENERAL:  Well-developed, well-nourished female.  The  patient is in no acute distress.  The patient is appropriate and talks easy  with examiner.  VITAL SIGNS:  Temperature 98.1, respirations 16, blood pressure 122/80,  pulse 64.  CARDIAC:  Regular rate and rhythm, no murmurs, rubs or gallops noted.  LUNGS:  Clear to auscultation bilaterally.  ABDOMEN:  Round, mildly obese.  Some epigastric tenderness, otherwise the  remainder of the abdomen is nontender.  There is a well-healed surgical  incision at midline.  She has bowel sounds throughout the abdomen.  BREASTS:  Deferred.   GENITALIA:  Deferred.  RECTAL:  Deferred.  BACK:  Nontender to palpation over the thoracic lumbar column.  NECK:  Trachea is midline, no lymphadenopathy.  Carotids are 2+ without  bruits.  She has relatively good range of motion of the neck, however, she  does experience pain whenever she rotates the neck to the left.  Palpation  along the cervical spine reveals some tenderness on the left at the C5-C7  paraspinous region.  HEENT:  Head is normocephalic, atraumatic without frontal or maxillary sinus  tenderness to palpation.  Conjunctivae is pink.  Sclerae is nonicteric and  PERRLA.  EOMs intact.  TMs pearly and gray.  There is no physical ear  deformity.  Buccal mucosa is pink and moist.  Oropharynx without erythema or  exudate.  Tongue and uvula midline.  NEUROLOGIC:  Alert and oriented x3.  Cranial nerves 2-12 grossly intact.  Lower extremity strength testing reveals 5/5 strength throughout.  MUSCULOSKELETAL:  Upper extremities are equal in size and shape.  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  The patient has full range of motion of the shoulders, arms and  hands.  Does have some numbness down the left arm and somewhat decreased  sensation to light touch.  EXTREMITIES:  Hips with full range of motion without pain.  Left knee 0-100  degrees of flexion.  Palpation on joint line reveals medial and lateral  joint line tenderness, medial greater than lateral.  No effusion or edema is  noted in the left knee.  Valgus varus stressing reveals no laxity.  Mild  crepitus.  Right knee 0-95 degrees of flexion.  Palpation along the joint  line reveals medial joint line pain.  There is some crepitus noted with  passive range of motion, no effusion or edema noted.  Back stressing  negative.  Lower extremities are nonedematous bilaterally.  Dorsal pedal  pulses are 2+ bilaterally.  LABORATORY DATA AND X-RAY FINDINGS:  X-rays show medial  compartment  collapse.  Patella femoral compartment has osteophyte.   IMPRESSION:  1.  Left knee osteoarthritis with medial compartment collapse.  2.  Right knee osteoarthritis.  3.  Hypertension.  4.  Hypothyroidism.  5.  Coronary artery disease with stent placement in September 2004.  6.  Vitamin B12 deficiency.  7.  Dyslipidemia.  8.  Degenerative disc disease of the cervical spine.  9.  Postmenopausal.  10. Diverticulitis with colon resection in February 2005.   PLAN:  The patient is to be admitted to [facility] on May 25, 2004, to undergo a left total knee replacement by Dr. Lenard Galloway. Mortenson.  Prior to surgery, the patient will undergo all preoperative lab testing.  The patient did receive clearance for her arthroscopy that was performed in  April 2005.  The patient has had no cardiac symptoms since this time and has  also undergone abdominal surgery for diverticulitis with colon resection  having no adverse effects with anesthesia.  Therefore, we will proceed with  total knee replacement and will contact her cardiologist on an as needed  basis.       GC/MEDQ  D:  05/20/2004  T:  05/20/2004  Job:  [id number]

## 2010-12-10 NOTE — Discharge Summary (Signed)
Andrea Norton, Andrea Norton                ACCOUNT NO.:  [id number]   MEDICAL RECORD NO.:  [id number]          PATIENT TYPE:  INP   LOCATION:  5002                         FACILITY:  [facility]   PHYSICIAN:  Rodney A. Mortenson, M.D.DATE OF BIRTH:  04/22/1946   DATE OF ADMISSION:  05/25/2004  DATE OF DISCHARGE:  05/28/2004                                 DISCHARGE SUMMARY   ADMISSION DIAGNOSIS:  1.  Left knee osteoarthritis with medial compartment collapse.  2.  Right knee osteoarthritis.  3.  Hypertension.  4.  Hypothyroidism.  5.  Coronary artery disease with stent placement in September 2004.  6.  Vitamin B12 deficiency.  7.  Dyslipidemia.  8.  Degenerative disc disease cervical spine.  9.  Post menopausal.  10. Diverticulosis with colon resection February 2005.   DISCHARGE DIAGNOSIS:  1.  Status post left total knee replacement.  2.  Right knee osteoarthritis.  3.  Hypertension.  4.  Hypothyroidism.  5.  Coronary artery disease with stent placement in September 2004.  6.  Vitamin B12 deficiency.  7.  Dyslipidemia.  8.  Degenerative disc disease cervical spine.  9.  Post menopausal.  10. Diverticulosis with colon resection February 2005.   HISTORY OF PRESENT ILLNESS:  Andrea Norton is a 65 year old white female with  bilateral knee pain, presently left knee pain greater than right, left knee  pain present for the past four years becoming progressively worse in the  past six months.  The patient has has undergone left knee arthroscopy in the  past in April 2005.  At that time, she had a tear of the posterior horn of  the medial meniscus with early arthritis.  The left knee pain was constant,  dull, aching pain, mostly in the medial portion of her knee.  Mechanical  symptoms positive for catching.  Pain worse with stairs, bending over, or  walking.  The patient uses a cane to ambulate.  X-rays of the left knee  shows medial compartment collapse with noted patellofemoral compartment  osteophyte.  The patient is to undergo left total knee on May 25, 2004,  at [facility].   ALLERGIES:  No known drug allergies.   MEDICATIONS:  Estrace 5 mg 1 daily, Provera 10 mg each month, 16th through  25th, Toprol 50 mg daily, Synthroid 120 mcg daily, Lovastatin 40 mg daily,  aspirin 81 mg daily, B12 injections 1 mL each month.   SURGICAL PROCEDURE:  The patient was taken to the operating room on May 25, 2004, by Dr. Chaney Malling, assisted by Arnoldo Morale, P.A.-C.  The patient was  placed under general anesthesia.  A left total knee replacement was  performed.  The following components were used 2.5 cemented MBP keel tibial  tray with a standard femoral component and a standard size 20 mm poly  insert, standard size patella, and one Mitek anchor.  The patient tolerated  the procedure well and returned to the recovery room in good, stable  condition.   CONSULTATIONS:  PT, OT, case management.   HOSPITAL COURSE:  Postoperatively, the patient developed acute blood  loss  anemia secondary to surgery, however, did not require blood transfusion and  discharge hemoglobin was 10 and hematocrit 29.1.  Otherwise, the patient  progressed well with physical therapy and was discharged home on postop day  three.  At that time, T-max was 100.2, pulse 107, respiratory rate 20, blood  pressure 121/76, O2 saturation 95-99% on room air.   LABORATORY DATA:  Repeat labs on admission showed CBC with white blood cell  count 8,100, hemoglobin 13.9, hematocrit 39.8, platelets 302.  Coags with PT  12.8, INR 1, PTT 31.  Chemistries on admission was within normal limits.  Routine hepatic enzymes on admission were within normal limits.  Urinalysis  on admission was negative.  ECG performed on May 19, 2004, showed normal  sinus rhythm with heart rate 68 beats per minute, PR interval 158  milliseconds, PRT axis 23, -334.   DISCHARGE MEDICATIONS:  1.  The patient is to resume preop meds  except for aspirin until [place]      completed.  2.  Arykstra 2.5 mg injected subcutaneously at 8 p.m., the last dose Monday,      start aspirin on Tuesday.  3.  OxyContin 10 mg 1 tablet q.12h.  4.  Percocet 5/325 1-2 tablets every 4-6 hours as needed for pain.   DISCHARGE INSTRUCTIONS:  Activities:  The patient is partial weight-bearing  50% or less on the left leg with brace and walker.  Diet without  restrictions.  Wound care:  The patient may shower after two days if no  drainage from wound.  The patient is to call our office if she develops a  temperature greater than 101.5, any foul smelling drainage, or pain that is  not controlled.  The patient needs follow up with Dr.  Chaney Malling in the  office Monday after discharge.  The patient is to call [phone] for  appointment.      GC/MEDQ  D:  08/23/2004  T:  08/23/2004  Job:  [id number]

## 2010-12-10 NOTE — Discharge Summary (Signed)
NAME:  Andrea Norton                          ACCOUNT NO.:  [id number]   MEDICAL RECORD NO.:  [id number]                   PATIENT TYPE:  OIB   LOCATION:  6531                                 FACILITY:  [facility]   PHYSICIAN:  Rollene Rotunda, M.D.                DATE OF BIRTH:  01-09-1946   DATE OF ADMISSION:  04/21/2003  DATE OF DISCHARGE:  04/22/2003                           DISCHARGE SUMMARY - REFERRING   PROCEDURE:  1. Cardiac catheterization.  2. Coronary arteriogram.  3. Left ventriculogram.  4. Percutaneous transluminal coronary angioplasty and stent of one vessel.   HOSPITAL COURSE:  Andrea Norton is a 65 year old female with no known history of  coronary artery disease. She was referred to cardiology by Dr. Lysbeth Galas for  chest pain. As part of her evaluation, she had a stress test which was  positive for ischemia, and she was referred for cardiac catheterization.   The cardiac catheterization shows 25% stenosis in the distal left main, and  the LAD had a proximal 25 and then a 95% stenosis. There was no other  significant disease, and her EF was 50% with anterior and anteroapical  hypokinesis. PCI of the LAD was recommended.   Andrea Norton had PTCA and a Taxus stent to the LAD on April 21, 2003. The  vessel was closed with Angioseal. She did well. She had some ecchymosis but  no hematoma or bruit.   The next day, her hemoglobin and hematocrit had not significantly changed,  and she was ambulating without chest pain or shortness of breath. The  ecchymosis was still present, and there was a possible small hematoma but no  bruit. Andrea Norton had a beta blocker added to her medication regimen, and her  blood pressure was dropping into the 90s, so the dosage was decreased from  50 to 25 mg a day. She was considered stable for discharge on April 22, 2003 with outpatient followup arranged.   LABORATORY DATA:  Hemoglobin 13.4, hematocrit 38.3, WBCs 7.1, platelets 269.  Sodium  138, potassium 3.9, chloride 108, CO2 24, BUN 9, creatinine 0.8,  glucose 101.   Chest x-ray:  No active disease. Both lungs were clear. The heart is at the  upper limits of normal.   DISCHARGE CONDITION:  Improved.   DISCHARGE DIAGNOSES:  1. Unstable anginal pain, status post percutaneous transluminal coronary     angioplasty and Taxus stent to the LAD this admission.  2. Left ventricular ejection fraction of 50% by catheterization this     admission.  3. Hypothyroidism.  4. Gastroesophageal reflux disease symptoms.  5. History of hyperlipidemia, intolerant of Lipitor.  6. Obesity.  7. Hypertension.  8. History of uterine fibroid.  9. Status post appendectomy, uterine polyp removal, and foot and knee     surgery.  10.      Family history of premature coronary artery disease.   DISCHARGE INSTRUCTIONS:  Activity  level was to include no driving for two  days and no strenuous activity for a week. She is to stick to a low fat  diet. She is to call the office with problems with the catheterization site.  She is to follow up with Dr. Lysbeth Galas as needed. She is to follow up with Dr.  Antoine Poche and will have an appointment prior to discharge.   DISCHARGE MEDICATIONS:  1. Provera 10 mg q.d.  2. Protonix 40 mg q.d.  3. B12 shots every month.  4. Estratest q.d.  5. Synthroid 125 mg q.d.  6. Vagifem as at home.  7. Aspirin 81 mg q.d.  8. Nitroglycerin p.r.n.  9. Plavix 75 mg q.d.  10.      Toprol-XL 50 mg one half tablet q.d.  11.      Pravachol 40 mg q.d.      [name], P.A. [facility]                  Rollene Rotunda, M.D.    RG/MEDQ  D:  04/22/2003  T:  04/22/2003  Job:  [id number]   cc:   Delaney Meigs, M.D.  [address]  [place]  [place] [address]  Fax: [phone]

## 2010-12-10 NOTE — Op Note (Signed)
[facility]  Patient:    Andrea Norton, Andrea Norton                         MRN: [id number] Attending:  Esmeralda Arthur, M.D.                           Operative Report  PREOPERATIVE DIAGNOSIS:       Regular ______ hormones.  POSTOPERATIVE DIAGNOSIS:      Regular ______ hormones.  OPERATION:  SURGEON:                      Esmeralda Arthur, M.D.  ANESTHESIA:  INDICATIONS:                  An endometrial polyp found on sonogram, measuring 1.7 x 9.9 x 2 cm.  The patient had an ultrasound which showed the thickened endometrium, and at that time, had an ultrasound which showed an endometrial cavity of 10 mm with the polyp as described.  At hysteroscopy, she had no other abnormalities.  She did have a fairly thick looking endometrium and she had a large polyp ________ good picture was made.  DESCRIPTION OF PROCEDURE:     The patient was carried to the operating room and after satisfactory general anesthesia, placed in the supine position, prepped and draped in the sterile field.  The bladder was emptied with catheterization.  The weighted speculum was placed in the posterior vagina. The cervix was grasped with the tenaculum and the uterus sounded to 8 cm.  We dilated her to a #25 and inserted the observation scope, and we were able to take nice pictures of the polyp.  We then dilated her further to a #35 and then we were able to remove most of the polyp with round stone grasping forceps.  We then inserted the hysteroscope and resected the base of the polyp and some other areas of thickened endometrium.  The pressure was progressively lowered and with bleeding, these were cauterized.  The patient tolerated the procedure well.  The scope was withdrawn.  We observed the patient for two minutes.  She had no excess bleeding.  The patient was then carried to the recovery room in good condition. DD:  02/13/01 TD:  02/14/01 Job: [id number]

## 2010-12-10 NOTE — Cardiovascular Report (Signed)
   NAME:  Andrea Norton, Andrea Norton                          ACCOUNT NO.:  [id number]   MEDICAL RECORD NO.:  [id number]                   PATIENT TYPE:  OIB   LOCATION:  2899                                 FACILITY:  [facility]   PHYSICIAN:  Rollene Rotunda, M.D.                DATE OF BIRTH:  Jul 27, 1945   DATE OF PROCEDURE:  04/21/2003  DATE OF DISCHARGE:                              CARDIAC CATHETERIZATION   PRIMARY:  Delaney Meigs, M.D.   PROCEDURE:  Left heart catheterization, coronary arteriography.   INDICATIONS:  Evaluate patient with exertional chest pain and a Cardiolite  demonstrating anterior ischemia.   PROCEDURE NOTE:  Left heart catheterization was performed via the right  femoral artery.  The artery was cannulated using anterior wall puncture.  A  #6-French arterial sheath was inserted via the modified Seldinger technique.  Preformed Judkins and a pigtail catheter were utilized.  The patient  tolerated the procedure well and left the laboratory in stable condition.   RESULTS:  HEMODYNAMICS:  LV 155/24, AO 149/82.   CORONARIES:  The left main had distal 25% stenosis.   The LAD had proximal 25% stenosis.  There was 99% stenosis at the large  first septal perforator.   The circumflex essentially consisted of a large branching mid obtuse  marginal which was normal.   The right coronary artery was dominant, but somewhat slender vessel.  It had  diffuse luminal irregularities.   LEFT VENTRICULOGRAM:  The left ventriculogram was approximately 50-55% with anteroapical  hypokinesis.   CONCLUSION:  High grade left anterior descending coronary stenosis.   PLAN:  The patient will have percutaneous revascularization of the LAD by  Carole Binning, M.D. [facility].  She will have aggressive secondary risk  reduction following this.                                               Rollene Rotunda, M.D.    JH/MEDQ  D:  04/21/2003  T:  04/21/2003  Job:  [id number]

## 2010-12-10 NOTE — Op Note (Signed)
Andrea Norton, Andrea Norton                ACCOUNT NO.:  [id number]   MEDICAL RECORD NO.:  [id number]          PATIENT TYPE:  INP   LOCATION:  2550                         FACILITY:  [facility]   PHYSICIAN:  Thereasa Distance A. Mortenson, M.D.DATE OF BIRTH:  11/04/1945   DATE OF PROCEDURE:  05/25/2004  DATE OF DISCHARGE:                                 OPERATIVE REPORT   PREOPERATIVE DIAGNOSIS:  Severe osteoarthritis of the left knee.   POSTOPERATIVE DIAGNOSIS:  Severe osteoarthritis of the left knee.   OPERATION PERFORMED:  Total knee replacement on the left using a 2.5  cemented MBT keel tibial tray with a standard femoral component and a  standard size 20 mm poly insert and a standard size patella and one Mitek  anchor.   SURGEON:  Lenard Galloway. Chaney Malling, M.D.   ASSISTANT:  Legrand Pitts. Duffy, P.A.   ANESTHESIA:  General.   DESCRIPTION OF PROCEDURE:  The patient was placed on the operating table in  the supine position with a pneumatic tourniquet about the left upper thigh.  The entire left lower extremity was prepped with DuraPrep and draped out in  the usual manner.  The left was then wrapped out with an Esmarch and the  tourniquet was elevated.  Incision was started above the patella and carried  down to the tibial tubercle.  Skin edges were retracted.  Median  parapatellar incision was made up to the vastus medialis.  An oblique  incision was made through the vastus medialis.  This allowed very good  access to the knee.  Localized synovectomy was done.  Knee was flexed.  Both  medial and lateral meniscus were excised.  Z-retractor was inserted.  Tibial  guide #1 was placed over the proximal end of the tibia and put at the  appropriate cutting level.  This was locked into position and the proximal  tibia was amputated.  A very nice flush cut was achieved.  Femoral guide #1  was placed over the distal end of the femur and a drill hole was placed. The  intramedullary rod was inserted.  A C-clamp was  inserted in the femoral  guide #1 and placed on the cut surface of the tibia with the knee flexed to  90 degrees.  This set up the proper rotation for the femoral component and  the femoral component was locked in place with locking pins.  Using the  CapSure guide, anterior and posterior condyles of the distal femur were then  amputated.  A 10 mm spacer block was inserted.  This was too loose and a 15  was inserted.  The tissues around the knee were extremely soft and supple  and did not seem to have a great deal of tensile integrity.  Spacer block  was removed.  There was good balancing of the collateral ligaments in  flexion.  Femoral guide #2 was placed over the anterior cut surface of the  femur with the intramedullary rod.  This was locked in place.  The distal  end of the femur was then amputated. A 50 mm spacer block was inserted and  this had fairly good balancing of both medial and lateral collateral  ligaments.  Femoral guide #3 was placed over the distal end of the femur,  chamfer cuts were made, and drill holes were made.  The guide was removed  and all loose bone removed.  The tibia was subluxed anteriorly with a McHale  and the 2.5 tibial tray trial was put in place and locked in position with  locking clamps.  The trial was inserted and drill hole was placed with  tibial keel.  The winged keel punch was then inserted and driven down.  This  fit very nicely.  The femoral trial component was placed over the distal end  of the femur  and the 15 mm trial insert was inserted.  The knee was put  through a full range of motion and was very loose.  17.5 mm was inserted and  this also seemed to be somewhat loose in flexion and extension.  Again, the  soft tissue seemed not to have great integrity and seemed to stretch.  There  was a 20 mm poly insert.  There was good stability in flexion and extension  and there was no tendency of the poly to spit or rotate out.  This was felt  to be  the appropriate length. The trial was then removed.  Attention was  turned to the posterior aspect of the patella.  The posterior aspect of the  patella was cut using the cutting clamp.  Drill holes were placed and the  trial poly was inserted.  The knee was then articulated once again and put  through a full range of motion.  The patella tracked very nicely in the  midline.  AP drawer was negative.  The knee was put in extension.  All  components were removed.  Pulsating lavage was used.  All debris was  removed.  Glue was then mixed and each of the components was then  sequentially glued in place starting with the tibia, then the femur, then  the patella.  Again, the trial poly was inserted and held in position with  the leg extended until the glue had cured.  There was excellent stability  with varus and valgus stress and excellent flexion.  Once the glue had  cured, an osteotome was used to remove excess glue.  The trial poly was then  removed.  Tourniquet was dropped and bleeders were coagulated.  Good  hemostasis was achieved.  The knee was then irrigated with pulsating lavage  once again.  The final poly was then inserted and snap fit in place.  There  was excellent stability to flexion-extension, varus and valgus stress.  There was still laxity of the medial capsule and a Mitek was placed over the  posterior medial corner imbedded in the tibia.  The capsule was then moved  anteriorly and the long median parapatellar incision was closed in a pants  over vest fashion.  Once this was accomplished, the knee was very stable.  The Mitek suture had been brought through the collateral ligament and this  was sutured posteriorly.  The knee was quite stable at this point.  Hemovac  drain had been inserted prior to closure.  Subcutaneous tissue closed with 2-  0 Vicryls and the skin was closed with stainless steel staples and sterile  dressings were applied.  Technically, this procedure went  extremely well.   DRAINS:  Hemovac.   COMPLICATIONS:  None.       RAM/MEDQ  D:  05/25/2004  T:  05/25/2004  Job:  [id number]

## 2010-12-10 NOTE — Letter (Signed)
May 22, 2007    [place]. Daphine Deutscher, MD  [address], [address]  [place], [place] [address]   RE:  Andrea Norton  MRN:  [id number]  /  DOB:  1946/04/30   Dear Susy Frizzle:   I had the pleasure of seeing [name] for a preoperative  consultation.  The full office note is available in E-chart.  In short,  she is a patient with previous coronary artery disease and a Taxus  stenting at a low functional level.  She is being considered for a  ventral hernia repair.  Prior to this, guidelines suggested a stress  perfusion study, which I performed.  This demonstrated no evidence of  scar or ischemia.  Her ejection fraction was 57%.   Based on this, the patient is at acceptable risk for the planned  surgery.  She should continue the medications listed, including her low-  dose beta blocker.  We would prefer to continue the aspirin if possible  through surgery.   If you have any questions, please do not hesitate to give me a call.  My  beeper [phone].    Sincerely,      Rollene Rotunda, MD, [facility]  Electronically Signed    JH/MedQ  DD: 05/22/2007  DT: 05/23/2007  Job #: [id number]

## 2010-12-10 NOTE — Cardiovascular Report (Signed)
   NAME:  Andrea Norton, Andrea Norton                          ACCOUNT NO.:  [id number]   MEDICAL RECORD NO.:  [id number]                   PATIENT TYPE:  OIB   LOCATION:  2899                                 FACILITY:  [facility]   PHYSICIAN:  Carole Binning, M.D. [facility]         DATE OF BIRTH:  1945-08-09   DATE OF PROCEDURE:  04/21/2003  DATE OF DISCHARGE:                              CARDIAC CATHETERIZATION   PROCEDURE PERFORMED:  PTCA with placement of a drug eluting stent in the  proximal left anterior descending artery.   INDICATIONS:  [name] is a 65 year old woman with a recent onset of  exertional dyspnea with chest discomfort.  She was referred for a stress  Cardiolite which showed significant ischemia in the anterior apical wall.  Cardiac catheterization performed today by Rollene Rotunda, M.D. revealed a  99% stenosis in the proximal left anterior descending artery.  We therefore  opted to proceed with percutaneous coronary intervention.   PROCEDURAL NOTE:  We utilized a preexisting 6-French sheath in the right  femoral artery.  Heparin and Integrilin were administered per protocol.  We  used a 6-French JL3.5 guiding catheter.  A BMW wire was advanced under  fluoroscopic guidance into the distal LAD.  PTCA was then performed with a  3.0 x 15 mm Quantum balloon inflated to six atmospheres.  We then deployed a  2.75 x 16 mm Taxus drug eluting stent across the lesion in the proximal LAD  to a deployment pressure of 11 atmospheres.  The stent was then post dilated  with a 3.25 x 8 mm Quantum balloon inflated to 16 atmospheres in the distal  aspect of the stent and 10 atmospheres in the mid aspect of the stent.  Final angiographic images revealed patency of the LAD with 0% residual  stenosis and TIMI 3 flow.   COMPLICATIONS:  None.   RESULTS:  Successful PTCA with placement of a drug eluting stent in the  proximal left anterior descending artery.  A 99% stenosis was reduced to 0%  residual  with TIMI 3 flow.   PLAN:  Integrilin will be continued for 18 hours.  Plavix will be  administered for a recommended six months.                                                Carole Binning, M.D. [facility]    MWP/MEDQ  D:  04/21/2003  T:  04/21/2003  Job:  [id number]   cc:   Delaney Meigs, M.D.  [address]  [place]  [place] [address]  Fax: [phone]

## 2011-06-18 ENCOUNTER — Other Ambulatory Visit: Payer: Self-pay | Admitting: Orthopedic Surgery

## 2011-08-01 ENCOUNTER — Encounter (HOSPITAL_BASED_OUTPATIENT_CLINIC_OR_DEPARTMENT_OTHER): Payer: Self-pay | Admitting: *Deleted

## 2011-08-01 ENCOUNTER — Telehealth: Payer: Self-pay | Admitting: Cardiology

## 2011-08-01 NOTE — Progress Notes (Addendum)
NPO AFTER MN. ARRIVES AT 1015. NEEDS CBC, BMET, EKG, AND CXR. WILL TAKE TOPROL, SIMVASTATIN, SYNTHROID AND HYDROCODONE AM OF SURG. W/ SIPS OF WATER.  REVIEWED CHART W/ DR ROSE (INCLUDING LAST VISIT NOTE AND STRESS TEST OF 2010 OF DR HOCHREIN) , OK TO PROCEED.

## 2011-08-01 NOTE — Telephone Encounter (Signed)
Patient is scheduled for surgery. Faxed patient's office note from 01-09-2009 and Stress test from 01-22-2009 to [phone] Attn: Angelique Blonder.Marland Kitchendjc

## 2011-08-02 NOTE — H&P (Signed)
CC- Teira Arcilla is a 66 y.o. female who presents with right knee pain.  HPI- . Knee Pain: Patient presents with knee pain involving the  right knee. Onset of the symptoms was several months ago. Inciting event: none known. Current symptoms include crepitus sensation. Pain is aggravated by going up and down stairs.  Patient has had prior knee problems. Evaluation to date: plain films: normal. Treatment to date: avoidance of offending activity and rest.  Past Medical History  Diagnosis Date  . Hypertension   . Hyperlipidemia   . Status post primary angioplasty with coronary stent     DRUG-ELUTING STENT X1  TO PROXIMAL LAD  . Coronary artery disease CARDIOLOGIST- DR [facility]-  LAST VISIT  2010    REQUESTED NOTE AND STRESS TEST  . Chronic back pain   . Chronic neck pain   . Arthritis     HANDS, KNEES  . DDD (degenerative disc disease), cervical   . DDD (degenerative disc disease), lumbar   . Radicular pain in left arm OCCASIONAL PAIN SHOOTING DOWN LEFT ARM SECAONDARY TO  CERVICAL DEGENERATION  . Hemorrhoid   . Diverticulitis of colon   . B12 deficiency anemia   . Vitamin D deficiency   . Hypothyroidism   . Synovitis of knee HYPERTROPHIC RIGHT KNEE  . Abnormal stress test 01-22-2009    LOW RISK ADENOSINE NUCLEAR STUDY W/ PROBABLE MILD APICAL THINNING BUT NO ISCHEMIA    Past Surgical History  Procedure Date  . Coronary angioplasty 2004    DRUG-ELUTING STENT X1 TO PROXIMAL  LAD  . Knee closed reduction 05-07-2009  RIGHT KNEE    LEFT KNEE  10-01-2009  . Total knee arthroplasty LEFT  2005    RIGHT  03-13-2009  . Total knee revision RIGHT 09-30-2009    LEFT 11-25-2009  . Hysteroscopy w/d&c 2002  . Open patellofemoral right knee/ lateral release 05-04-2009  . Knee arthroscopy w/ debridement 01-22-2010    SEPTIC KNEE  . Right knee i & d polythylene revision 02-04-2010    KNEE INFECTED  . Left foot surg. 1990  . Appendectomy 1993  . Knee arthroscopy LEFT 1999    RIGHT 2004  .  Hemicolectomy for diverticulitis 2005    Prior to Admission medications   Medication Sig Start Date End Date Taking? Authorizing Provider  aspirin EC 81 MG tablet Take 81 mg by mouth daily.     Yes Historical Provider, MD  Cyanocobalamin (VITAMIN B-12 IJ) Inject as directed every 30 (thirty) days.     Yes Historical Provider, MD  ergocalciferol (VITAMIN D2) 50000 UNITS capsule Take 50,000 Units by mouth once a week.     Yes Historical Provider, MD  furosemide (LASIX) 20 MG tablet Take 20 mg by mouth daily.     Yes Historical Provider, MD  HYDROcodone-acetaminophen (VICODIN) 5-500 MG per tablet Take 1 tablet by mouth every 6 (six) hours as needed.     Yes Historical Provider, MD  levothyroxine (SYNTHROID, LEVOTHROID) 137 MCG tablet Take 137 mcg by mouth every morning.     Yes Historical Provider, MD  methocarbamol (ROBAXIN) 500 MG tablet Take 500 mg by mouth as needed.     Yes Historical Provider, MD  metoprolol succinate (TOPROL-XL) 25 MG 24 hr tablet Take 25 mg by mouth every morning.     Yes Historical Provider, MD  polysaccharide iron (NIFEREX) 150 MG CAPS capsule Take 150 mg by mouth daily.     Yes Historical Provider, MD  potassium chloride SA (K-DUR,KLOR-CON)  20 MEQ tablet Take 20 mEq by mouth 2 (two) times daily.     Yes Historical Provider, MD  simvastatin (ZOCOR) 40 MG tablet Take 40 mg by mouth every morning.     Yes Historical Provider, MD   KNEE EXAM Marked crepitus on ROM right knee. No tenderness or instability. Well healed scar from previous TKA  Physical Examination: General appearance - alert, well appearing, and in no distress Mental status - alert, oriented to person, place, and time Chest - clear to auscultation, no wheezes, rales or rhonchi, symmetric air entry Heart - normal rate, regular rhythm, normal S1, S2, no murmurs, rubs, clicks or gallops Abdomen - soft, nontender, nondistended, no masses or organomegaly Neurological - alert, oriented, normal speech, no focal  findings or movement disorder noted   Asessment/Plan--- Rightt knee hypertrophic synovitisr- - Plan Right knee arthroscopy with synovectomy. Procedure risks and potential comps discussed with patient who elects to proceed. Goals are decreased pain and increased function with a high likelihood of achieving both

## 2011-08-03 ENCOUNTER — Ambulatory Visit (HOSPITAL_BASED_OUTPATIENT_CLINIC_OR_DEPARTMENT_OTHER): Payer: PRIVATE HEALTH INSURANCE | Admitting: Certified Registered"

## 2011-08-03 ENCOUNTER — Encounter (HOSPITAL_BASED_OUTPATIENT_CLINIC_OR_DEPARTMENT_OTHER): Payer: Self-pay | Admitting: Orthopedic Surgery

## 2011-08-03 ENCOUNTER — Encounter (HOSPITAL_BASED_OUTPATIENT_CLINIC_OR_DEPARTMENT_OTHER): Payer: Self-pay | Admitting: Certified Registered"

## 2011-08-03 ENCOUNTER — Other Ambulatory Visit: Payer: Self-pay

## 2011-08-03 ENCOUNTER — Ambulatory Visit (HOSPITAL_COMMUNITY): Payer: PRIVATE HEALTH INSURANCE

## 2011-08-03 ENCOUNTER — Ambulatory Visit (HOSPITAL_BASED_OUTPATIENT_CLINIC_OR_DEPARTMENT_OTHER)
Admission: RE | Admit: 2011-08-03 | Discharge: 2011-08-03 | Disposition: A | Discharge status: Home / Self Care | Payer: PRIVATE HEALTH INSURANCE | Source: Ambulatory Visit | Attending: Orthopedic Surgery | Admitting: Orthopedic Surgery

## 2011-08-03 ENCOUNTER — Encounter (HOSPITAL_BASED_OUTPATIENT_CLINIC_OR_DEPARTMENT_OTHER)
Admission: RE | Disposition: A | Discharge status: Home / Self Care | Payer: Self-pay | Source: Ambulatory Visit | Attending: Orthopedic Surgery

## 2011-08-03 DIAGNOSIS — Z01818 Encounter for other preprocedural examination: Secondary | ICD-10-CM | POA: Insufficient documentation

## 2011-08-03 DIAGNOSIS — I2510 Atherosclerotic heart disease of native coronary artery without angina pectoris: Secondary | ICD-10-CM | POA: Insufficient documentation

## 2011-08-03 DIAGNOSIS — M25569 Pain in unspecified knee: Secondary | ICD-10-CM

## 2011-08-03 DIAGNOSIS — Z79899 Other long term (current) drug therapy: Secondary | ICD-10-CM | POA: Insufficient documentation

## 2011-08-03 DIAGNOSIS — M5030 Other cervical disc degeneration, unspecified cervical region: Secondary | ICD-10-CM | POA: Insufficient documentation

## 2011-08-03 DIAGNOSIS — M1710 Unilateral primary osteoarthritis, unspecified knee: Secondary | ICD-10-CM | POA: Insufficient documentation

## 2011-08-03 DIAGNOSIS — Z7982 Long term (current) use of aspirin: Secondary | ICD-10-CM | POA: Insufficient documentation

## 2011-08-03 DIAGNOSIS — R29898 Other symptoms and signs involving the musculoskeletal system: Secondary | ICD-10-CM | POA: Insufficient documentation

## 2011-08-03 DIAGNOSIS — M51379 Other intervertebral disc degeneration, lumbosacral region without mention of lumbar back pain or lower extremity pain: Secondary | ICD-10-CM | POA: Insufficient documentation

## 2011-08-03 DIAGNOSIS — M6580 Other synovitis and tenosynovitis, unspecified site: Secondary | ICD-10-CM | POA: Insufficient documentation

## 2011-08-03 DIAGNOSIS — I10 Essential (primary) hypertension: Secondary | ICD-10-CM | POA: Insufficient documentation

## 2011-08-03 DIAGNOSIS — M5137 Other intervertebral disc degeneration, lumbosacral region: Secondary | ICD-10-CM | POA: Insufficient documentation

## 2011-08-03 DIAGNOSIS — Z01812 Encounter for preprocedural laboratory examination: Secondary | ICD-10-CM | POA: Insufficient documentation

## 2011-08-03 DIAGNOSIS — E039 Hypothyroidism, unspecified: Secondary | ICD-10-CM | POA: Insufficient documentation

## 2011-08-03 DIAGNOSIS — E785 Hyperlipidemia, unspecified: Secondary | ICD-10-CM | POA: Insufficient documentation

## 2011-08-03 DIAGNOSIS — Z96659 Presence of unspecified artificial knee joint: Secondary | ICD-10-CM | POA: Insufficient documentation

## 2011-08-03 HISTORY — DX: Unspecified osteoarthritis, unspecified site: M19.90

## 2011-08-03 HISTORY — DX: Neuralgia and neuritis, unspecified: M79.2

## 2011-08-03 HISTORY — DX: Hypothyroidism, unspecified: E03.9

## 2011-08-03 HISTORY — DX: Diverticulitis of large intestine without perforation or abscess without bleeding: K57.32

## 2011-08-03 HISTORY — DX: Abnormal result of other cardiovascular function study: R94.39

## 2011-08-03 HISTORY — PX: SYNOVECTOMY: SHX5180

## 2011-08-03 HISTORY — DX: Other cervical disc degeneration, unspecified cervical region: M50.30

## 2011-08-03 HISTORY — DX: Hyperlipidemia, unspecified: E78.5

## 2011-08-03 HISTORY — DX: Other chronic pain: G89.29

## 2011-08-03 HISTORY — DX: Unspecified hemorrhoids: K64.9

## 2011-08-03 HISTORY — DX: Cervicalgia: M54.2

## 2011-08-03 HISTORY — DX: Essential (primary) hypertension: I10

## 2011-08-03 HISTORY — DX: Dorsalgia, unspecified: M54.9

## 2011-08-03 HISTORY — DX: Synovitis and tenosynovitis, unspecified: M65.9

## 2011-08-03 HISTORY — DX: Vitamin B12 deficiency anemia, unspecified: D51.9

## 2011-08-03 HISTORY — DX: Presence of coronary angioplasty implant and graft: Z95.5

## 2011-08-03 HISTORY — DX: Atherosclerotic heart disease of native coronary artery without angina pectoris: I25.10

## 2011-08-03 HISTORY — DX: Other intervertebral disc degeneration, lumbar region: M51.36

## 2011-08-03 HISTORY — DX: Other intervertebral disc degeneration, lumbar region without mention of lumbar back pain or lower extremity pain: M51.369

## 2011-08-03 HISTORY — DX: Unspecified synovitis and tenosynovitis, unspecified lower leg: M65.969

## 2011-08-03 HISTORY — PX: KNEE ARTHROSCOPY: SHX127

## 2011-08-03 LAB — CBC
MCH: 25.8 pg — ABNORMAL LOW (ref 26.0–34.0)
MCHC: 31.9 g/dL (ref 30.0–36.0)
RDW: 15.6 % — ABNORMAL HIGH (ref 11.5–15.5)

## 2011-08-03 LAB — BASIC METABOLIC PANEL
BUN: 14 mg/dL (ref 6–23)
Creatinine, Ser: 0.72 mg/dL (ref 0.50–1.10)
GFR calc Af Amer: >90 mL/min (ref >90)
GFR calc non Af Amer: 89 mL/min — ABNORMAL LOW (ref >90)

## 2011-08-03 SURGERY — ARTHROSCOPY, KNEE
Anesthesia: General | Site: Knee | Laterality: Right | Wound class: Clean

## 2011-08-03 MED ORDER — CEFAZOLIN SODIUM 1-5 GM-% IV SOLN
INTRAVENOUS | Status: DC | PRN
  Administered 2011-08-03: 2 g via INTRAVENOUS

## 2011-08-03 MED ORDER — ACETAMINOPHEN 10 MG/ML IV SOLN: 1000.0000 mg | Freq: Once | INTRAVENOUS | Status: DC

## 2011-08-03 MED ORDER — LACTATED RINGERS IV SOLN
INTRAVENOUS | Status: DC
  Administered 2011-08-03: 11:00:00 via INTRAVENOUS

## 2011-08-03 MED ORDER — FENTANYL CITRATE 0.05 MG/ML IJ SOLN
INTRAMUSCULAR | Status: DC | PRN
  Administered 2011-08-03 (×2): 100 ug via INTRAVENOUS

## 2011-08-03 MED ORDER — CEFAZOLIN SODIUM-DEXTROSE 2-3 GM-% IV SOLR: 2.0000 g | Freq: Once | INTRAVENOUS | Status: DC

## 2011-08-03 MED ORDER — METHOCARBAMOL 500 MG PO TABS: 500.0000 mg | ORAL_TABLET | Freq: Four times a day (QID) | ORAL | Status: AC

## 2011-08-03 MED ORDER — OXYCODONE-ACETAMINOPHEN 5-325 MG PO TABS: 1.0000 | ORAL_TABLET | ORAL | Status: AC | PRN

## 2011-08-03 MED ORDER — ONDANSETRON HCL 4 MG/2ML IJ SOLN
INTRAMUSCULAR | Status: DC | PRN
  Administered 2011-08-03: 4 mg via INTRAVENOUS

## 2011-08-03 MED ORDER — MIDAZOLAM HCL 5 MG/5ML IJ SOLN
INTRAMUSCULAR | Status: DC | PRN
  Administered 2011-08-03: 2 mg via INTRAVENOUS

## 2011-08-03 MED ORDER — LACTATED RINGERS IV SOLN
INTRAVENOUS | Status: DC
  Administered 2011-08-03: 14:00:00 via INTRAVENOUS

## 2011-08-03 MED ORDER — SODIUM CHLORIDE 0.9 % IR SOLN
Status: DC | PRN
  Administered 2011-08-03: 12000 mL

## 2011-08-03 MED ORDER — PROPOFOL 10 MG/ML IV EMUL
INTRAVENOUS | Status: DC | PRN
  Administered 2011-08-03: 160 mg via INTRAVENOUS

## 2011-08-03 MED ORDER — LACTATED RINGERS IV SOLN
INTRAVENOUS | Status: DC | PRN
  Administered 2011-08-03 (×2): via INTRAVENOUS

## 2011-08-03 MED ORDER — BUPIVACAINE-EPINEPHRINE 0.25% -1:200000 IJ SOLN
INTRAMUSCULAR | Status: DC | PRN
  Administered 2011-08-03: 30 mL

## 2011-08-03 MED ORDER — KETOROLAC TROMETHAMINE 30 MG/ML IJ SOLN
INTRAMUSCULAR | Status: DC | PRN
  Administered 2011-08-03: 30 mg via INTRAVENOUS

## 2011-08-03 MED ORDER — FENTANYL CITRATE 0.05 MG/ML IJ SOLN: 25.0000 ug | INTRAMUSCULAR | Status: DC | PRN

## 2011-08-03 MED ORDER — SODIUM CHLORIDE 0.9 % IV SOLN: INTRAVENOUS | Status: DC

## 2011-08-03 MED ORDER — CHLORHEXIDINE GLUCONATE 4 % EX LIQD: 60.0000 mL | Freq: Once | CUTANEOUS | Status: DC

## 2011-08-03 MED ORDER — DEXAMETHASONE SODIUM PHOSPHATE 4 MG/ML IJ SOLN
INTRAMUSCULAR | Status: DC | PRN
  Administered 2011-08-03: 4 mg via INTRAVENOUS

## 2011-08-03 MED ORDER — METHOCARBAMOL 500 MG PO TABS
500.0000 mg | ORAL_TABLET | Freq: Three times a day (TID) | ORAL | Status: DC
  Administered 2011-08-03: 500 mg via ORAL

## 2011-08-03 MED ORDER — OXYCODONE-ACETAMINOPHEN 5-325 MG PO TABS
1.0000 | ORAL_TABLET | ORAL | Status: DC | PRN
  Administered 2011-08-03: 1 via ORAL

## 2011-08-03 MED ORDER — LIDOCAINE HCL (CARDIAC) 20 MG/ML IV SOLN
INTRAVENOUS | Status: DC | PRN
  Administered 2011-08-03: 100 mg via INTRAVENOUS

## 2011-08-03 SURGICAL SUPPLY — 36 items
BANDAGE ELASTIC 6 VELCRO ST LF (GAUZE/BANDAGES/DRESSINGS) ×2 IMPLANT
BLADE 4.2CUDA (BLADE) ×2 IMPLANT
BLADE CUDA SHAVER 3.5 (BLADE) IMPLANT
BLADE CUTTER GATOR 3.5 (BLADE) IMPLANT
CANISTER SUCT LVC 12 LTR MEDI- (MISCELLANEOUS) ×2 IMPLANT
CANISTER SUCTION 2500CC (MISCELLANEOUS) IMPLANT
CLOTH BEACON ORANGE TIMEOUT ST (SAFETY) ×2 IMPLANT
DRAPE ARTHROSCOPY W/POUCH 114 (DRAPES) ×2 IMPLANT
DRSG EMULSION OIL 3X3 NADH (GAUZE/BANDAGES/DRESSINGS) ×2 IMPLANT
DRSG PAD ABDOMINAL 8X10 ST (GAUZE/BANDAGES/DRESSINGS) ×1 IMPLANT
DURAPREP 26ML APPLICATOR (WOUND CARE) ×2 IMPLANT
ELECT MENISCUS 165MM 90D (ELECTRODE) IMPLANT
ELECT REM PT RETURN 9FT ADLT (ELECTROSURGICAL)
ELECTRODE REM PT RTRN 9FT ADLT (ELECTROSURGICAL) IMPLANT
GAUZE SPONGE 4X4 12PLY STRL LF (GAUZE/BANDAGES/DRESSINGS) ×1 IMPLANT
GLOVE BIO SURGEON STRL SZ8 (GLOVE) ×2 IMPLANT
GLOVE BIOGEL PI IND STRL 6.5 (GLOVE) IMPLANT
GLOVE BIOGEL PI INDICATOR 6.5 (GLOVE) ×2
GLOVE INDICATOR 8.0 STRL GRN (GLOVE) ×3 IMPLANT
GOWN PREVENTION PLUS LG XLONG (DISPOSABLE) ×4 IMPLANT
IV NS IRRIG 3000ML ARTHROMATIC (IV SOLUTION) ×5 IMPLANT
KNEE WRAP E Z 3 GEL PACK (MISCELLANEOUS) ×2 IMPLANT
PACK ARTHROSCOPY DSU (CUSTOM PROCEDURE TRAY) ×2 IMPLANT
PACK BASIN DAY SURGERY FS (CUSTOM PROCEDURE TRAY) ×2 IMPLANT
PADDING CAST ABS 4INX4YD NS (CAST SUPPLIES)
PADDING CAST ABS COTTON 4X4 ST (CAST SUPPLIES) ×1 IMPLANT
PADDING CAST COTTON 6X4 STRL (CAST SUPPLIES) ×2 IMPLANT
PENCIL BUTTON HOLSTER BLD 10FT (ELECTRODE) IMPLANT
SET ARTHROSCOPY TUBING (MISCELLANEOUS) ×2
SET ARTHROSCOPY TUBING LN (MISCELLANEOUS) ×1 IMPLANT
SPONGE GAUZE 4X4 12PLY (GAUZE/BANDAGES/DRESSINGS) ×2 IMPLANT
SUT ETHILON 4 0 PS 2 18 (SUTURE) ×2 IMPLANT
TOWEL OR 17X24 6PK STRL BLUE (TOWEL DISPOSABLE) ×2 IMPLANT
WAND 30 DEG SABER W/CORD (SURGICAL WAND) IMPLANT
WAND 90 DEG TURBOVAC W/CORD (SURGICAL WAND) ×1 IMPLANT
WATER STERILE IRR 500ML POUR (IV SOLUTION) ×2 IMPLANT

## 2011-08-03 NOTE — Anesthesia Postprocedure Evaluation (Signed)
  Anesthesia Post-op Note  Patient: Andrea Norton  Procedure(s) Performed:  ARTHROSCOPY KNEE; SYNOVECTOMY  Patient Location: PACU  Anesthesia Type: General  Level of Consciousness: awake and alert   Airway and Oxygen Therapy: Patient Spontanous Breathing  Post-op Pain: mild  Post-op Assessment: Post-op Vital signs reviewed, Patient's Cardiovascular Status Stable, Respiratory Function Stable, Patent Airway and No signs of Nausea or vomiting  Post-op Vital Signs: stable  Complications: No apparent anesthesia complications

## 2011-08-03 NOTE — Interval H&P Note (Signed)
History and Physical Interval Note:  08/03/2011 12:42 PM  Andrea Norton  has presented today for surgery, with the diagnosis of right knee hypertrophic synovitis  The various methods of treatment have been discussed with the patient and family. After consideration of risks, benefits and other options for treatment, the patient has consented to  Procedure(s): ARTHROSCOPY KNEE as a surgical intervention .  The patients' history has been reviewed, patient examined, no change in status, stable for surgery.  I have reviewed the patients' chart and labs.  Questions were answered to the patient's satisfaction.     Loanne Drilling

## 2011-08-03 NOTE — Anesthesia Postprocedure Evaluation (Deleted)
  Anesthesia Post-op Note  Patient: Andrea Norton  Procedure(s) Performed:  ARTHROSCOPY KNEE; SYNOVECTOMY  Patient Location: PACU  Anesthesia Type: MAC and General  Level of Consciousness: awake and alert   Airway and Oxygen Therapy: Patient Spontanous Breathing  Post-op Pain: mild  Post-op Assessment: Post-op Vital signs reviewed, Patient's Cardiovascular Status Stable, Respiratory Function Stable, Patent Airway and No signs of Nausea or vomiting  Post-op Vital Signs: stable  Complications: No apparent anesthesia complications

## 2011-08-03 NOTE — Anesthesia Procedure Notes (Signed)
Procedure Name: LMA Insertion Date/Time: 08/03/2011 1:02 PM Performed by: Renella Cunas D Pre-anesthesia Checklist: Patient identified, Emergency Drugs available, Suction available and Patient being monitored Patient Re-evaluated:Patient Re-evaluated prior to inductionOxygen Delivery Method: Circle System Utilized Preoxygenation: Pre-oxygenation with 100% oxygen Intubation Type: IV induction Ventilation: Mask ventilation without difficulty LMA: LMA with gastric port inserted LMA Size: 4.0 Number of attempts: 1 Placement Confirmation: positive ETCO2 Tube secured with: Tape Dental Injury: Teeth and Oropharynx as per pre-operative assessment

## 2011-08-03 NOTE — Brief Op Note (Signed)
08/03/2011  1:35 PM  PATIENT:  Andrea Norton  66 y.o. female  PRE-OPERATIVE DIAGNOSIS:  right knee hypertrophic synovitis  POST-OPERATIVE DIAGNOSIS:  right knee hypertrophic synovitis  PROCEDURE:  Procedure(s): ARTHROSCOPY KNEE SYNOVECTOMY-Right  SURGEON:  Surgeon(s): Gus Rankin Clois Montavon  PHYSICIAN ASSISTANT:   ASSISTANTS: none   ANESTHESIA:   general  EBL:  Total I/O In: 200 [I.V.:200] Out: -   BLOOD ADMINISTERED:none  DRAINS: none   LOCAL MEDICATIONS USED:  MARCAINE 20 CC  TOURNIQUET:  * No tourniquets in log *  DICTATION: .Other Dictation: Dictation Number [id number]  PLAN OF CARE: Discharge to home after PACU  PATIENT DISPOSITION:  PACU - hemodynamically stable.  Gus Rankin Demarcus Thielke, MD    08/03/2011, 1:39 PM

## 2011-08-03 NOTE — Transfer of Care (Signed)
Immediate Anesthesia Transfer of Care Note  Patient: Andrea Norton  Procedure(s) Performed:  ARTHROSCOPY KNEE; SYNOVECTOMY  Patient Location: PACU  Anesthesia Type: General  Level of Consciousness: awake, sedated, patient cooperative and responds to stimulation  Airway & Oxygen Therapy: Patient Spontanous Breathing and Patient connected to face mask oxygen  Post-op Assessment: Report given to PACU RN, Post -op Vital signs reviewed and stable and Patient moving all extremities  Post vital signs: Reviewed and stable  Complications: No apparent anesthesia complications

## 2011-08-03 NOTE — Anesthesia Preprocedure Evaluation (Addendum)
Anesthesia Evaluation  Patient identified by MRN, date of birth, ID band Patient awake    Reviewed: Allergy & Precautions, H&P , NPO status , Patient's Chart, lab work & pertinent test results  Airway Mallampati: II TM Distance: >3 FB Neck ROM: Full    Dental No notable dental hx.    Pulmonary neg pulmonary ROS, shortness of breath, sleep apnea ,  clear to auscultation  Pulmonary exam normal       Cardiovascular hypertension, Pt. on home beta blockers + CAD and neg cardio ROS Regular Normal Denies cardiac symptoms.   Neuro/Psych Negative Neurological ROS  Negative Psych ROS   GI/Hepatic negative GI ROS, Neg liver ROS, GERD-  ,  Endo/Other  Negative Endocrine ROSHypothyroidism   Renal/GU negative Renal ROS  Genitourinary negative   Musculoskeletal negative musculoskeletal ROS (+)   Abdominal (+) obese,   Peds negative pediatric ROS (+)  Hematology negative hematology ROS (+)   Anesthesia Other Findings   Reproductive/Obstetrics negative OB ROS                          Anesthesia Physical Anesthesia Plan  ASA: III  Anesthesia Plan: General   Post-op Pain Management:    Induction: Intravenous  Airway Management Planned: LMA  Additional Equipment:   Intra-op Plan:   Post-operative Plan: Extubation in OR  Informed Consent: I have reviewed the patients History and Physical, chart, labs and discussed the procedure including the risks, benefits and alternatives for the proposed anesthesia with the patient or authorized representative who has indicated his/her understanding and acceptance.   Dental advisory given  Plan Discussed with: CRNA  Anesthesia Plan Comments:         Anesthesia Quick Evaluation

## 2011-08-04 ENCOUNTER — Encounter (HOSPITAL_BASED_OUTPATIENT_CLINIC_OR_DEPARTMENT_OTHER): Payer: Self-pay | Admitting: Orthopedic Surgery

## 2011-08-04 NOTE — Op Note (Signed)
NAMELEEZA, HEINER NO.:  [id number]  MEDICAL RECORD NO.:  [id number]  LOCATION:                               FACILITY:  [facility]  PHYSICIAN:  Ollen Gross, M.D.    DATE OF BIRTH:  Jun 14, 1946  DATE OF PROCEDURE: DATE OF DISCHARGE:                              OPERATIVE REPORT   PREOPERATIVE DIAGNOSIS:  Right knee hypertrophic synovitis.  POSTOPERATIVE DIAGNOSIS:  Right knee hypertrophic synovitis.  PROCEDURE:  Right knee scope and synovectomy.  SURGEON:  Ollen Gross, M.D.  ASSISTANT:  None.  ANESTHESIA:  General.  ESTIMATED BLOOD LOSS:  Minimal.  DRAIN:  None.  COMPLICATIONS:  None.  CONDITION:  Stable to recovery.  CLINICAL INDICATION:  Ms. Besse is a 66 year old female with a long complex history regards to her right knee.  She has undergone revision knee surgery for instability.  She has painful crepitus of the knee consistent with hypertrophic synovitis.  She presents for scope and synovectomy.  PROCEDURE IN DETAIL:  After successful administration of general anesthetic, a tourniquet was placed on the right thigh and right lower extremity was prepped and draped in the usual sterile fashion.  Standard superomedial and inferolateral incisions were made.  Inflow cannula passed superomedial, cannula passed inferolateral.  Arthroscopic visualization proceeds.  In the suprapatellar area, there were tremendous amount of hypertrophic tissue, which she is in the position working catch between the patellar component and the notch of the femoral component.  Superolateral portal was created.  This tissue was debrided with a shaver and ArthroCare, so we have recreates the suprapatellar pouch as well as the mediolateral gutters.  Inferiorly, this tissue also which could be catching in the notch and that is removed with the shaver and the ArthroCare.  Once I felt that adequate tissue was removed, I placed the knee through a range of motion.  There was  no further crepitus.  The arthroscopic equipments were removed from the lateral portals which were closed with interrupted 4-0 nylon.  20 mL of 0.25% Marcaine with epinephrine were injected through the inflow cannula, the notch removed and that portal closed with nylon.  Incisions were cleaned and dried, and a bulky sterile dressing applied.  She was awakened and transported to recovery in stable condition.     Ollen Gross, M.D.     FA/MEDQ  D:  08/03/2011  T:  08/04/2011  Job:  [id number]

## 2011-11-13 ENCOUNTER — Other Ambulatory Visit: Payer: Self-pay | Admitting: Orthopedic Surgery

## 2011-11-13 MED ORDER — BUPIVACAINE 0.25 % ON-Q PUMP SINGLE CATH 300ML: 300.0000 mL | INJECTION | Status: DC

## 2011-11-13 MED ORDER — DEXAMETHASONE SODIUM PHOSPHATE 10 MG/ML IJ SOLN: 10.0000 mg | Freq: Once | INTRAMUSCULAR | Status: DC

## 2012-01-19 ENCOUNTER — Encounter (HOSPITAL_COMMUNITY): Payer: Self-pay | Admitting: Pharmacy Technician

## 2012-01-23 ENCOUNTER — Encounter (HOSPITAL_COMMUNITY)
Admission: RE | Admit: 2012-01-23 | Discharge: 2012-01-23 | Disposition: A | Discharge status: Home / Self Care | Payer: Medicare Other | Source: Ambulatory Visit | Attending: Orthopedic Surgery | Admitting: Orthopedic Surgery

## 2012-01-23 ENCOUNTER — Inpatient Hospital Stay (HOSPITAL_COMMUNITY): Admission: RE | Admit: 2012-01-23 | Payer: Medicare Other | Source: Ambulatory Visit

## 2012-01-23 ENCOUNTER — Encounter (HOSPITAL_COMMUNITY): Payer: Self-pay

## 2012-01-23 HISTORY — DX: Sleep apnea, unspecified: G47.30

## 2012-01-23 HISTORY — DX: Unilateral inguinal hernia, without obstruction or gangrene, not specified as recurrent: K40.90

## 2012-01-23 HISTORY — DX: Major depressive disorder, single episode, unspecified: F32.9

## 2012-01-23 HISTORY — DX: Gastro-esophageal reflux disease without esophagitis: K21.9

## 2012-01-23 HISTORY — DX: Depression, unspecified: F32.A

## 2012-01-23 LAB — COMPREHENSIVE METABOLIC PANEL
ALT: 15 U/L (ref 0–35)
Albumin: 3.4 g/dL — ABNORMAL LOW (ref 3.5–5.2)
Alkaline Phosphatase: 99 U/L (ref 39–117)
BUN: 13 mg/dL (ref 6–23)
Potassium: 4.3 mEq/L (ref 3.5–5.1)
Sodium: 140 mEq/L (ref 135–145)
Total Protein: 7.9 g/dL (ref 6.0–8.3)

## 2012-01-23 LAB — SURGICAL PCR SCREEN
MRSA, PCR: NEGATIVE
Staphylococcus aureus: NEGATIVE

## 2012-01-23 LAB — CBC
MCHC: 31.8 g/dL (ref 30.0–36.0)
Platelets: 378 10*3/uL (ref 150–400)
RDW: 15.2 % (ref 11.5–15.5)

## 2012-01-23 LAB — URINALYSIS, ROUTINE W REFLEX MICROSCOPIC
Hgb urine dipstick: NEGATIVE
Specific Gravity, Urine: 1.029 (ref 1.005–1.030)
Urobilinogen, UA: 0.2 mg/dL (ref 0.0–1.0)

## 2012-01-23 LAB — APTT: aPTT: 33 seconds (ref 24–37)

## 2012-01-23 LAB — PROTIME-INR: Prothrombin Time: 13.4 seconds (ref 11.6–15.2)

## 2012-01-23 LAB — URINE MICROSCOPIC-ADD ON

## 2012-01-23 NOTE — Patient Instructions (Signed)
20 Shayanna Thatch  01/23/2012   Your procedure is scheduled on:  02/01/12  Wednesday  Surgery [id number]  Report to [facility] at   0815    AM.  Call this number if you have problems the morning of surgery: [phone]     Or PST   [id number]  Larnie Heart  EAT SNACK BEFORE BED OR MIDNIGHT Tuesday NIGHT  Remember:              DO NOT TAKE ANY BLOOD SUGAR MEDICINE MORNING OF SURGERY  Do not eat food:or drink any fluids After Midnight.   Tuesday NIGHT     Take these medicines the morning of surgery with A SIP OF WATER:      CYMBALTA, LEVOTHYROXINE, TOPROL                                VICODIN IF NEEDED   Do not wear jewelry, make-up or nail polish.  Do not wear lotions, powders, or perfumes. You may wear deodorant.  Do not shave 48 hours prior to surgery.  Do not bring valuables to the hospital.  Contacts, dentures or bridgework may not be worn into surgery.  Leave suitcase in the car. After surgery it may be brought to your room.  For patients admitted to the hospital, checkout time is 11:00 AM the day of discharge.   Patients discharged the day of surgery will not be allowed to drive home.  Name and phone number of your driver:  husband                                                                    Special Instructions: CHG Shower Use Special Wash: 1/2 bottle night before surgery and 1/2 bottle morning of surgery. REGULAR SOAP FACE AND PRIVATES              LADIES- NO SHAVING 48 HOURS BEFORE USING BETASEPT SOAP.                 Please read over the following fact sheets that you were given: MRSA Information

## 2012-01-25 NOTE — Pre-Procedure Instructions (Signed)
Received faxed confirmation that Dr Lequita Halt reviewed abnormal urine with micro- stated no action

## 2012-01-31 NOTE — Pre-Procedure Instructions (Signed)
Per patient at PST visit- had clearance Dr Lysbeth Galas completed. Spoke with Parke Poisson, schedular at [facility] who stated she will check with D Perkins pa 01/31/12 FOR THE CLEARANCE NOTE   Late entry for 01/23/12

## 2012-02-01 ENCOUNTER — Encounter (HOSPITAL_COMMUNITY): Payer: Self-pay | Admitting: Anesthesiology

## 2012-02-01 ENCOUNTER — Encounter (HOSPITAL_COMMUNITY)
Admission: RE | Disposition: A | Discharge status: Home Health | Payer: Self-pay | Source: Ambulatory Visit | Attending: Orthopedic Surgery

## 2012-02-01 ENCOUNTER — Ambulatory Visit (HOSPITAL_COMMUNITY): Payer: Medicare Other | Admitting: Anesthesiology

## 2012-02-01 ENCOUNTER — Inpatient Hospital Stay (HOSPITAL_COMMUNITY)
Admission: RE | Admit: 2012-02-01 | Discharge: 2012-02-03 | DRG: 488 | Disposition: A | Discharge status: Home Health | Payer: Medicare Other | Source: Ambulatory Visit | Attending: Orthopedic Surgery | Admitting: Orthopedic Surgery

## 2012-02-01 ENCOUNTER — Encounter (HOSPITAL_COMMUNITY): Payer: Self-pay | Admitting: *Deleted

## 2012-02-01 DIAGNOSIS — I2510 Atherosclerotic heart disease of native coronary artery without angina pectoris: Secondary | ICD-10-CM | POA: Diagnosis present

## 2012-02-01 DIAGNOSIS — Z96659 Presence of unspecified artificial knee joint: Secondary | ICD-10-CM

## 2012-02-01 DIAGNOSIS — Z9861 Coronary angioplasty status: Secondary | ICD-10-CM

## 2012-02-01 DIAGNOSIS — T84028A Dislocation of other internal joint prosthesis, initial encounter: Secondary | ICD-10-CM

## 2012-02-01 DIAGNOSIS — Y831 Surgical operation with implant of artificial internal device as the cause of abnormal reaction of the patient, or of later complication, without mention of misadventure at the time of the procedure: Secondary | ICD-10-CM | POA: Diagnosis present

## 2012-02-01 DIAGNOSIS — Z01812 Encounter for preprocedural laboratory examination: Secondary | ICD-10-CM

## 2012-02-01 DIAGNOSIS — E039 Hypothyroidism, unspecified: Secondary | ICD-10-CM | POA: Diagnosis present

## 2012-02-01 DIAGNOSIS — T84029A Dislocation of unspecified internal joint prosthesis, initial encounter: Principal | ICD-10-CM | POA: Diagnosis present

## 2012-02-01 DIAGNOSIS — M1710 Unilateral primary osteoarthritis, unspecified knee: Secondary | ICD-10-CM | POA: Diagnosis present

## 2012-02-01 DIAGNOSIS — Z6841 Body Mass Index (BMI) 40.0 and over, adult: Secondary | ICD-10-CM

## 2012-02-01 DIAGNOSIS — I10 Essential (primary) hypertension: Secondary | ICD-10-CM | POA: Diagnosis present

## 2012-02-01 HISTORY — PX: I & D KNEE WITH POLY EXCHANGE: SHX5024

## 2012-02-01 LAB — GLUCOSE, CAPILLARY
Glucose-Capillary: 112 mg/dL — ABNORMAL HIGH (ref 70–99)
Glucose-Capillary: 152 mg/dL — ABNORMAL HIGH (ref 70–99)

## 2012-02-01 SURGERY — IRRIGATION AND DEBRIDEMENT KNEE WITH POLY EXCHANGE
Anesthesia: General | Site: Knee | Laterality: Right | Wound class: Clean

## 2012-02-01 MED ORDER — CEFAZOLIN SODIUM 1-5 GM-% IV SOLN
INTRAVENOUS | Status: AC
  Filled 2012-02-01: qty 50

## 2012-02-01 MED ORDER — INSULIN ASPART 100 UNIT/ML ~~LOC~~ SOLN
0.0000 [IU] | Freq: Three times a day (TID) | SUBCUTANEOUS | Status: DC
  Administered 2012-02-01: 3 [IU] via SUBCUTANEOUS

## 2012-02-01 MED ORDER — MIDAZOLAM HCL 5 MG/5ML IJ SOLN
INTRAMUSCULAR | Status: DC | PRN
  Administered 2012-02-01: 1 mg via INTRAVENOUS

## 2012-02-01 MED ORDER — ACETAMINOPHEN 10 MG/ML IV SOLN
1000.0000 mg | Freq: Four times a day (QID) | INTRAVENOUS | Status: AC
  Administered 2012-02-01 – 2012-02-02 (×4): 1000 mg via INTRAVENOUS
  Filled 2012-02-01 (×5): qty 100

## 2012-02-01 MED ORDER — BUPIVACAINE HCL (PF) 0.25 % IJ SOLN
INTRAMUSCULAR | Status: DC | PRN
  Administered 2012-02-01: 11:00:00

## 2012-02-01 MED ORDER — FLEET ENEMA 7-19 GM/118ML RE ENEM: 1.0000 | ENEMA | Freq: Once | RECTAL | Status: AC | PRN

## 2012-02-01 MED ORDER — METOPROLOL SUCCINATE ER 25 MG PO TB24
25.0000 mg | ORAL_TABLET | Freq: Every day | ORAL | Status: DC
  Administered 2012-02-02 – 2012-02-03 (×2): 25 mg via ORAL
  Filled 2012-02-01 (×2): qty 1

## 2012-02-01 MED ORDER — CEFAZOLIN SODIUM-DEXTROSE 2-3 GM-% IV SOLR
INTRAVENOUS | Status: AC
  Filled 2012-02-01: qty 50

## 2012-02-01 MED ORDER — HYDROMORPHONE HCL PF 1 MG/ML IJ SOLN
0.2500 mg | INTRAMUSCULAR | Status: DC | PRN
  Administered 2012-02-01 (×4): 0.5 mg via INTRAVENOUS

## 2012-02-01 MED ORDER — SIMVASTATIN 40 MG PO TABS
40.0000 mg | ORAL_TABLET | Freq: Every day | ORAL | Status: DC
  Administered 2012-02-01 – 2012-02-02 (×2): 40 mg via ORAL
  Filled 2012-02-01 (×3): qty 1

## 2012-02-01 MED ORDER — SODIUM CHLORIDE 0.9 % IR SOLN
Status: DC | PRN
  Administered 2012-02-01: 1000 mL

## 2012-02-01 MED ORDER — ACETAMINOPHEN 10 MG/ML IV SOLN
INTRAVENOUS | Status: AC
  Filled 2012-02-01: qty 100

## 2012-02-01 MED ORDER — ACETAMINOPHEN 10 MG/ML IV SOLN
1000.0000 mg | Freq: Once | INTRAVENOUS | Status: DC
  Filled 2012-02-01: qty 100

## 2012-02-01 MED ORDER — HYDROMORPHONE HCL PF 1 MG/ML IJ SOLN
INTRAMUSCULAR | Status: AC
  Filled 2012-02-01: qty 2

## 2012-02-01 MED ORDER — MORPHINE SULFATE 2 MG/ML IJ SOLN
1.0000 mg | INTRAMUSCULAR | Status: DC | PRN
  Administered 2012-02-01 (×2): 2 mg via INTRAVENOUS
  Filled 2012-02-01 (×2): qty 1

## 2012-02-01 MED ORDER — FUROSEMIDE 20 MG PO TABS
20.0000 mg | ORAL_TABLET | Freq: Every day | ORAL | Status: DC
Start: 2012-02-01 — End: 2012-02-03
  Administered 2012-02-02 – 2012-02-03 (×2): 20 mg via ORAL
  Filled 2012-02-01 (×3): qty 1

## 2012-02-01 MED ORDER — LIDOCAINE HCL (CARDIAC) 20 MG/ML IV SOLN
INTRAVENOUS | Status: DC | PRN
  Administered 2012-02-01: 50 mg via INTRAVENOUS

## 2012-02-01 MED ORDER — ACETAMINOPHEN 325 MG PO TABS: 650.0000 mg | ORAL_TABLET | Freq: Four times a day (QID) | ORAL | Status: DC | PRN

## 2012-02-01 MED ORDER — SUCCINYLCHOLINE CHLORIDE 20 MG/ML IJ SOLN
INTRAMUSCULAR | Status: DC | PRN
  Administered 2012-02-01: 100 mg via INTRAVENOUS

## 2012-02-01 MED ORDER — MENTHOL 3 MG MT LOZG: 1.0000 | LOZENGE | OROMUCOSAL | Status: DC | PRN

## 2012-02-01 MED ORDER — METFORMIN HCL 500 MG PO TABS
500.0000 mg | ORAL_TABLET | Freq: Every day | ORAL | Status: DC
  Filled 2012-02-01 (×2): qty 1

## 2012-02-01 MED ORDER — ONDANSETRON HCL 4 MG PO TABS: 4.0000 mg | ORAL_TABLET | Freq: Four times a day (QID) | ORAL | Status: DC | PRN

## 2012-02-01 MED ORDER — SODIUM CHLORIDE 0.9 % IV BOLUS (SEPSIS)
500.0000 mL | Freq: Once | INTRAVENOUS | Status: AC
  Administered 2012-02-01: 500 mL via INTRAVENOUS

## 2012-02-01 MED ORDER — SODIUM CHLORIDE 0.9 % IR SOLN
Status: DC | PRN
  Administered 2012-02-01: 3000 mL

## 2012-02-01 MED ORDER — NEOSTIGMINE METHYLSULFATE 1 MG/ML IJ SOLN
INTRAMUSCULAR | Status: DC | PRN
  Administered 2012-02-01: 3 mg via INTRAVENOUS

## 2012-02-01 MED ORDER — CISATRACURIUM BESYLATE (PF) 10 MG/5ML IV SOLN
INTRAVENOUS | Status: DC | PRN
  Administered 2012-02-01: 10 mg via INTRAVENOUS

## 2012-02-01 MED ORDER — LEVOTHYROXINE SODIUM 150 MCG PO TABS
150.0000 ug | ORAL_TABLET | Freq: Every day | ORAL | Status: DC
  Administered 2012-02-02 – 2012-02-03 (×2): 150 ug via ORAL
  Filled 2012-02-01 (×3): qty 1

## 2012-02-01 MED ORDER — TRAMADOL HCL 50 MG PO TABS: 50.0000 mg | ORAL_TABLET | Freq: Four times a day (QID) | ORAL | Status: DC | PRN

## 2012-02-01 MED ORDER — LACTATED RINGERS IV SOLN
INTRAVENOUS | Status: DC | PRN
  Administered 2012-02-01: 10:00:00 via INTRAVENOUS

## 2012-02-01 MED ORDER — PHENOL 1.4 % MT LIQD: 1.0000 | OROMUCOSAL | Status: DC | PRN

## 2012-02-01 MED ORDER — CEFAZOLIN SODIUM 1 G IJ SOLR
3.0000 g | INTRAMUSCULAR | Status: AC
  Administered 2012-02-01: 3 g via INTRAVENOUS
  Filled 2012-02-01: qty 30

## 2012-02-01 MED ORDER — DOCUSATE SODIUM 100 MG PO CAPS
100.0000 mg | ORAL_CAPSULE | Freq: Two times a day (BID) | ORAL | Status: DC
  Administered 2012-02-01 – 2012-02-02 (×3): 100 mg via ORAL

## 2012-02-01 MED ORDER — OXYCODONE HCL 5 MG PO TABS
5.0000 mg | ORAL_TABLET | ORAL | Status: DC | PRN
  Administered 2012-02-01 – 2012-02-03 (×11): 10 mg via ORAL
  Filled 2012-02-01 (×11): qty 2

## 2012-02-01 MED ORDER — POLYETHYLENE GLYCOL 3350 17 G PO PACK: 17.0000 g | PACK | Freq: Every day | ORAL | Status: DC | PRN

## 2012-02-01 MED ORDER — DIPHENHYDRAMINE HCL 12.5 MG/5ML PO ELIX: 12.5000 mg | ORAL_SOLUTION | ORAL | Status: DC | PRN

## 2012-02-01 MED ORDER — BUPIVACAINE 0.25 % ON-Q PUMP SINGLE CATH 300ML
INJECTION | Status: AC
  Filled 2012-02-01: qty 300

## 2012-02-01 MED ORDER — CEFAZOLIN SODIUM-DEXTROSE 2-3 GM-% IV SOLR
2.0000 g | Freq: Four times a day (QID) | INTRAVENOUS | Status: AC
  Administered 2012-02-01 (×2): 2 g via INTRAVENOUS
  Filled 2012-02-01 (×2): qty 50

## 2012-02-01 MED ORDER — FENTANYL CITRATE 0.05 MG/ML IJ SOLN
INTRAMUSCULAR | Status: DC | PRN
  Administered 2012-02-01 (×3): 50 ug via INTRAVENOUS

## 2012-02-01 MED ORDER — METHOCARBAMOL 500 MG PO TABS
500.0000 mg | ORAL_TABLET | Freq: Four times a day (QID) | ORAL | Status: DC | PRN
  Administered 2012-02-02 – 2012-02-03 (×4): 500 mg via ORAL
  Filled 2012-02-01 (×4): qty 1

## 2012-02-01 MED ORDER — SODIUM CHLORIDE 0.9 % IV SOLN: INTRAVENOUS | Status: DC

## 2012-02-01 MED ORDER — ACETAMINOPHEN 10 MG/ML IV SOLN
INTRAVENOUS | Status: DC | PRN
  Administered 2012-02-01: 1000 mg via INTRAVENOUS

## 2012-02-01 MED ORDER — BISACODYL 10 MG RE SUPP: 10.0000 mg | Freq: Every day | RECTAL | Status: DC | PRN

## 2012-02-01 MED ORDER — ACETAMINOPHEN 650 MG RE SUPP: 650.0000 mg | Freq: Four times a day (QID) | RECTAL | Status: DC | PRN

## 2012-02-01 MED ORDER — LACTATED RINGERS IV SOLN
INTRAVENOUS | Status: DC
  Administered 2012-02-01: 1000 mL via INTRAVENOUS

## 2012-02-01 MED ORDER — METOCLOPRAMIDE HCL 10 MG PO TABS: 5.0000 mg | ORAL_TABLET | Freq: Three times a day (TID) | ORAL | Status: DC | PRN

## 2012-02-01 MED ORDER — ONDANSETRON HCL 4 MG/2ML IJ SOLN
INTRAMUSCULAR | Status: DC | PRN
  Administered 2012-02-01 (×2): 2 mg via INTRAVENOUS

## 2012-02-01 MED ORDER — POTASSIUM CHLORIDE CRYS ER 20 MEQ PO TBCR
20.0000 meq | EXTENDED_RELEASE_TABLET | Freq: Every day | ORAL | Status: DC
  Administered 2012-02-01 – 2012-02-03 (×3): 20 meq via ORAL
  Filled 2012-02-01 (×3): qty 1

## 2012-02-01 MED ORDER — BUPIVACAINE ON-Q PAIN PUMP (FOR ORDER SET NO CHG)
INJECTION | Status: DC
  Filled 2012-02-01: qty 1

## 2012-02-01 MED ORDER — RIVAROXABAN 10 MG PO TABS
10.0000 mg | ORAL_TABLET | Freq: Every day | ORAL | Status: DC
  Administered 2012-02-02 – 2012-02-03 (×2): 10 mg via ORAL
  Filled 2012-02-01 (×3): qty 1

## 2012-02-01 MED ORDER — PROMETHAZINE HCL 25 MG/ML IJ SOLN: 6.2500 mg | INTRAMUSCULAR | Status: DC | PRN

## 2012-02-01 MED ORDER — KETAMINE HCL 10 MG/ML IJ SOLN
INTRAMUSCULAR | Status: DC | PRN
  Administered 2012-02-01 (×2): 5 mg via INTRAVENOUS

## 2012-02-01 MED ORDER — DULOXETINE HCL 60 MG PO CPEP
60.0000 mg | ORAL_CAPSULE | Freq: Every day | ORAL | Status: DC
  Administered 2012-02-02 – 2012-02-03 (×2): 60 mg via ORAL
  Filled 2012-02-01 (×2): qty 1

## 2012-02-01 MED ORDER — GLYCOPYRROLATE 0.2 MG/ML IJ SOLN
INTRAMUSCULAR | Status: DC | PRN
  Administered 2012-02-01: 0.4 mg via INTRAVENOUS

## 2012-02-01 MED ORDER — ONDANSETRON HCL 4 MG/2ML IJ SOLN: 4.0000 mg | Freq: Four times a day (QID) | INTRAMUSCULAR | Status: DC | PRN

## 2012-02-01 MED ORDER — DEXAMETHASONE SODIUM PHOSPHATE 4 MG/ML IJ SOLN
INTRAMUSCULAR | Status: DC | PRN
  Administered 2012-02-01: 10 mg via INTRAVENOUS

## 2012-02-01 MED ORDER — METHOCARBAMOL 100 MG/ML IJ SOLN
500.0000 mg | Freq: Four times a day (QID) | INTRAVENOUS | Status: DC | PRN
  Administered 2012-02-01 (×2): 500 mg via INTRAVENOUS
  Filled 2012-02-01 (×2): qty 5

## 2012-02-01 MED ORDER — SODIUM CHLORIDE 0.45 % IV SOLN
INTRAVENOUS | Status: DC
  Administered 2012-02-01: 15:00:00 via INTRAVENOUS
  Administered 2012-02-02: 20 mL/h via INTRAVENOUS
  Administered 2012-02-02: 04:00:00 via INTRAVENOUS

## 2012-02-01 MED ORDER — PROPOFOL 10 MG/ML IV EMUL
INTRAVENOUS | Status: DC | PRN
  Administered 2012-02-01: 250 mg via INTRAVENOUS

## 2012-02-01 MED ORDER — METOCLOPRAMIDE HCL 5 MG/ML IJ SOLN: 5.0000 mg | Freq: Three times a day (TID) | INTRAMUSCULAR | Status: DC | PRN

## 2012-02-01 MED ORDER — EPHEDRINE SULFATE 50 MG/ML IJ SOLN
INTRAMUSCULAR | Status: DC | PRN
  Administered 2012-02-01: 10 mg via INTRAVENOUS

## 2012-02-01 SURGICAL SUPPLY — 49 items
BAG SPEC THK2 15X12 ZIP CLS (MISCELLANEOUS) ×1
BAG ZIPLOCK 12X15 (MISCELLANEOUS) ×2 IMPLANT
BANDAGE ELASTIC 6 VELCRO ST LF (GAUZE/BANDAGES/DRESSINGS) ×2 IMPLANT
BANDAGE ESMARK 6X9 LF (GAUZE/BANDAGES/DRESSINGS) ×1 IMPLANT
BNDG CMPR 9X6 STRL LF SNTH (GAUZE/BANDAGES/DRESSINGS) ×1
BNDG ESMARK 6X9 LF (GAUZE/BANDAGES/DRESSINGS) ×2
CLOTH BEACON ORANGE TIMEOUT ST (SAFETY) ×2 IMPLANT
CONT SPECI 4OZ STER CLIK (MISCELLANEOUS) ×2 IMPLANT
CUFF TOURN SGL QUICK 34 (TOURNIQUET CUFF) ×2
CUFF TRNQT CYL 34X4X40X1 (TOURNIQUET CUFF) ×1 IMPLANT
DRAPE EXTREMITY T 121X128X90 (DRAPE) ×2 IMPLANT
DRAPE POUCH INSTRU U-SHP 10X18 (DRAPES) ×2 IMPLANT
DRAPE U-SHAPE 47X51 STRL (DRAPES) ×2 IMPLANT
DRSG ADAPTIC 3X8 NADH LF (GAUZE/BANDAGES/DRESSINGS) ×2 IMPLANT
DRSG EMULSION OIL 3X16 NADH (GAUZE/BANDAGES/DRESSINGS) ×1 IMPLANT
DRSG PAD ABDOMINAL 8X10 ST (GAUZE/BANDAGES/DRESSINGS) ×1 IMPLANT
DURAPREP 26ML APPLICATOR (WOUND CARE) ×2 IMPLANT
ELECT REM PT RETURN 9FT ADLT (ELECTROSURGICAL) ×2
ELECTRODE REM PT RTRN 9FT ADLT (ELECTROSURGICAL) ×1 IMPLANT
FACESHIELD LNG OPTICON STERILE (SAFETY) ×10 IMPLANT
GLOVE BIO SURGEON STRL SZ7.5 (GLOVE) ×2 IMPLANT
GLOVE BIO SURGEON STRL SZ8 (GLOVE) ×2 IMPLANT
GOWN STRL NON-REIN LRG LVL3 (GOWN DISPOSABLE) ×2 IMPLANT
GOWN STRL REIN XL XLG (GOWN DISPOSABLE) ×2 IMPLANT
HANDPIECE INTERPULSE COAX TIP (DISPOSABLE) ×2
IMMOBILIZER KNEE 20 (SOFTGOODS) ×2
IMMOBILIZER KNEE 20 THIGH 36 (SOFTGOODS) ×1 IMPLANT
INSERT TIBIAL RC3 RP SZ 4 30.0 (Insert) ×1 IMPLANT
KIT BASIN OR (CUSTOM PROCEDURE TRAY) ×2 IMPLANT
MANIFOLD NEPTUNE II (INSTRUMENTS) ×2 IMPLANT
NS IRRIG 1000ML POUR BTL (IV SOLUTION) ×2 IMPLANT
PACK TOTAL JOINT (CUSTOM PROCEDURE TRAY) ×2 IMPLANT
PAD ABD 7.5X8 STRL (GAUZE/BANDAGES/DRESSINGS) ×4 IMPLANT
PADDING CAST COTTON 6X4 STRL (CAST SUPPLIES) ×3 IMPLANT
POSITIONER SURGICAL ARM (MISCELLANEOUS) ×2 IMPLANT
SET HNDPC FAN SPRY TIP SCT (DISPOSABLE) ×1 IMPLANT
SPONGE GAUZE 4X4 12PLY (GAUZE/BANDAGES/DRESSINGS) ×2 IMPLANT
SPONGE LAP 18X18 X RAY DECT (DISPOSABLE) ×2 IMPLANT
STAPLER VISISTAT 35W (STAPLE) ×2 IMPLANT
SUCTION FRAZIER 12FR DISP (SUCTIONS) ×2 IMPLANT
SUT PDS AB 1 CT1 27 (SUTURE) ×6 IMPLANT
SUT VIC AB 2-0 CT1 27 (SUTURE) ×6
SUT VIC AB 2-0 CT1 TAPERPNT 27 (SUTURE) ×3 IMPLANT
SWAB COLLECTION DEVICE MRSA (MISCELLANEOUS) ×2 IMPLANT
TOWEL OR 17X26 10 PK STRL BLUE (TOWEL DISPOSABLE) ×4 IMPLANT
TRAY FOLEY CATH 14FRSI W/METER (CATHETERS) ×2 IMPLANT
TUBE ANAEROBIC SPECIMEN COL (MISCELLANEOUS) ×2 IMPLANT
WATER STERILE IRR 1500ML POUR (IV SOLUTION) ×2 IMPLANT
WRAP KNEE MAXI GEL POST OP (GAUZE/BANDAGES/DRESSINGS) ×4 IMPLANT

## 2012-02-01 NOTE — H&P (Signed)
CC- Andrea Norton is a 66 y.o. female who presents with right knee pain.  HPI- . Knee Pain: Patient presents with knee pain involving the  right knee. Onset of the symptoms was several months ago. Inciting event: none known. Current symptoms include giving out and swelling. Pain is aggravated by going up and down stairs, rising after sitting and walking.  Patient has had prior knee problems. Evaluation to date: x-rays showing revision prosthesis in good position with no abnormalitis. Treatment to date: brace which is not very effective.  Past Medical History  Diagnosis Date  . Hyperlipidemia   . Status post primary angioplasty with coronary stent     DRUG-ELUTING STENT X1  TO PROXIMAL LAD  . Chronic back pain   . Chronic neck pain   . Arthritis     HANDS, KNEES  . DDD (degenerative disc disease), cervical   . DDD (degenerative disc disease), lumbar   . Radicular pain in left arm OCCASIONAL PAIN SHOOTING DOWN LEFT ARM SECAONDARY TO  CERVICAL DEGENERATION  . Hemorrhoid   . Diverticulitis of colon   . B12 deficiency anemia   . Vitamin d deficiency   . Hypothyroidism   . Synovitis of knee HYPERTROPHIC RIGHT KNEE  . Abnormal stress test 01-22-2009    LOW RISK ADENOSINE NUCLEAR STUDY W/ PROBABLE MILD APICAL THINNING BUT NO ISCHEMIA  . Depression     states well controlled  . Sleep apnea     couldnt tolerate machine- states last sleep study " many years ago"  . Diabetes mellitus   . GERD (gastroesophageal reflux disease)   . Inguinal hernia, right     with ventral hernia  . Coronary artery disease 2010    STRESS TEST /, NOte Dr Kirtland Bouchard EPIC/ no cardiologist now  . Hypertension     chest x ray, ekg 1/13 EPIC    PCP Dr Lysbeth Galas  No cardiologist- Last note  Dr Kirtland Bouchard 2010    Past Surgical History  Procedure Date  . Knee closed reduction 05-07-2009  RIGHT KNEE    LEFT KNEE  10-01-2009  . Total knee arthroplasty LEFT  2005    RIGHT  03-13-2009  . Total knee revision RIGHT 09-30-2009     LEFT 11-25-2009  . Hysteroscopy w/d&c 2002  . Open patellofemoral right knee/ lateral release 05-04-2009  . Knee arthroscopy w/ debridement 01-22-2010    SEPTIC KNEE  . Right knee i & d polythylene revision 02-04-2010    KNEE INFECTED  . Left foot surg. 1990  . Appendectomy 1993  . Knee arthroscopy LEFT 1999    RIGHT 2004  . Hemicolectomy for diverticulitis 2005  . Knee arthroscopy 08/03/2011    Procedure: ARTHROSCOPY KNEE;  Surgeon: Loanne Drilling;  Location: [facility];  Service: Orthopedics;  Laterality: Right;  . Synovectomy 08/03/2011    Procedure: SYNOVECTOMY;  Surgeon: Loanne Drilling;  Location: [facility];  Service: Orthopedics;  Laterality: Right;  . Joint replacement   . Coronary angioplasty 2004    DRUG-ELUTING STENT X1 TO PROXIMAL  LAD    Prior to Admission medications   Medication Sig Start Date End Date Taking? Authorizing Provider  aspirin EC 81 MG tablet Take 81 mg by mouth daily with breakfast.     Historical Provider, MD  Cyanocobalamin (VITAMIN B-12 IJ) Inject 1,000 mcg as directed every 30 (thirty) days.     Historical Provider, MD  DULoxetine (CYMBALTA) 60 MG capsule Take 60 mg by mouth daily with  breakfast.    Historical Provider, MD  ergocalciferol (VITAMIN D2) 50000 UNITS capsule Take 50,000 Units by mouth once a week. On mondays    Historical Provider, MD  furosemide (LASIX) 20 MG tablet Take 20 mg by mouth daily with breakfast.     Historical Provider, MD  HYDROcodone-acetaminophen (VICODIN) 5-500 MG per tablet Take 1 tablet by mouth every 6 (six) hours as needed. For pain    Historical Provider, MD  levothyroxine (SYNTHROID, LEVOTHROID) 150 MCG tablet Take 150 mcg by mouth daily before breakfast. Brand Name ONLY    Historical Provider, MD  metFORMIN (GLUCOPHAGE) 500 MG tablet Take 500 mg by mouth daily with breakfast.    Historical Provider, MD  metoprolol succinate (TOPROL-XL) 25 MG 24 hr tablet Take 25 mg by mouth daily  with breakfast.     Historical Provider, MD  potassium chloride SA (K-DUR,KLOR-CON) 20 MEQ tablet Take 20 mEq by mouth daily with breakfast.     Historical Provider, MD  simvastatin (ZOCOR) 40 MG tablet Take 40 mg by mouth every morning.     Historical Provider, MD   KNEE EXAM No effusion or warmth. ROM 0-115. Laxity to varus/valgus in extension and AP laxity in flexion. No discrete tenderness.  Physical Examination: General appearance - alert, well appearing, and in no distress Mental status - alert, oriented to person, place, and time Chest - clear to auscultation, no wheezes, rales or rhonchi, symmetric air entry Heart - normal rate, regular rhythm, normal S1, S2, no murmurs, rubs, clicks or gallops Abdomen - soft, nontender, nondistended, no masses or organomegaly Neurological - alert, oriented, normal speech, no focal findings or movement disorder noted   Asessment/Plan---Right knee TKA instability- - Plan right knee polyethylene revision.lProcedure risks and potential comps discussed with patient who elects to proceed. Goals are decreased pain and increased function with a high likelihood of achieving both

## 2012-02-01 NOTE — Anesthesia Preprocedure Evaluation (Addendum)
Anesthesia Evaluation  Patient identified by MRN, date of birth, ID band Patient awake    Reviewed: Allergy & Precautions, H&P , NPO status , Patient's Chart, lab work & pertinent test results  Airway Mallampati: II TM Distance: <3 FB Neck ROM: Full    Dental No notable dental hx.    Pulmonary neg pulmonary ROS,  breath sounds clear to auscultation  + decreased breath sounds      Cardiovascular hypertension, + CAD and + Cardiac Stents Rhythm:Regular Rate:Normal     Neuro/Psych negative neurological ROS  negative psych ROS   GI/Hepatic negative GI ROS, Neg liver ROS,   Endo/Other  Hypothyroidism Morbid obesity  Renal/GU negative Renal ROS  negative genitourinary   Musculoskeletal negative musculoskeletal ROS (+)   Abdominal   Peds negative pediatric ROS (+)  Hematology negative hematology ROS (+)   Anesthesia Other Findings   Reproductive/Obstetrics negative OB ROS                           Anesthesia Physical Anesthesia Plan  ASA: III  Anesthesia Plan: General   Post-op Pain Management:    Induction: Intravenous  Airway Management Planned: Oral ETT  Additional Equipment:   Intra-op Plan:   Post-operative Plan: Extubation in OR  Informed Consent: I have reviewed the patients History and Physical, chart, labs and discussed the procedure including the risks, benefits and alternatives for the proposed anesthesia with the patient or authorized representative who has indicated his/her understanding and acceptance.   Dental advisory given  Plan Discussed with: CRNA  Anesthesia Plan Comments:         Anesthesia Quick Evaluation

## 2012-02-01 NOTE — Interval H&P Note (Signed)
History and Physical Interval Note:  02/01/2012 10:03 AM  Andrea Norton  has presented today for surgery, with the diagnosis of right total knee instability  The various methods of treatment have been discussed with the patient and family. After consideration of risks, benefits and other options for treatment, the patient has consented to  Procedure(s) (LRB): IRRIGATION AND DEBRIDEMENT KNEE WITH POLY EXCHANGE (Right) as a surgical intervention .  The patient's history has been reviewed, patient examined, no change in status, stable for surgery.  I have reviewed the patients' chart and labs.  Questions were answered to the patient's satisfaction.     Loanne Drilling

## 2012-02-01 NOTE — Transfer of Care (Signed)
Immediate Anesthesia Transfer of Care Note  Patient: Andrea Norton  Procedure(s) Performed: Procedure(s) (LRB): IRRIGATION AND DEBRIDEMENT KNEE WITH POLY EXCHANGE (Right)  Patient Location: PACU  Anesthesia Type: General  Level of Consciousness: awake and oriented  Airway & Oxygen Therapy: Patient Spontanous Breathing and Patient connected to face mask oxygen  Post-op Assessment: Report given to PACU RN and Post -op Vital signs reviewed and stable  Post vital signs: Reviewed and stable  Complications: No apparent anesthesia complications

## 2012-02-01 NOTE — Brief Op Note (Signed)
02/01/2012  11:26 AM  PATIENT:  Andrea Norton  66 y.o. female  PRE-OPERATIVE DIAGNOSIS:  right total knee instability  POST-OPERATIVE DIAGNOSIS:  right total knee instability  PROCEDURE:  Procedure(s) (LRB): RIGHT KNEE TIBIAL POLYETHYLENE REVISION  SURGEON:  Surgeon(s) and Role:    * Loanne Drilling, MD - Primary  PHYSICIAN ASSISTANT:   ASSISTANTS: [name], PA-C   ANESTHESIA:   general  EBL:  Total I/O In: 1000 [I.V.:1000] Out: -   DRAINS: (Medium) Hemovact drain(s) in the right knee with  Suction Open   LOCAL MEDICATIONS USED:  OTHER  Marcaine On -Q pain pump  TOURNIQUET:   Total Tourniquet Time Documented: Thigh (Right) - 24 minutes  DICTATION: .Other Dictation: Dictation Number [id number]  PLAN OF CARE: Admit to inpatient   PATIENT DISPOSITION:  PACU - hemodynamically stable.   [name], MD    02/01/2012, 11:31 AM

## 2012-02-01 NOTE — Anesthesia Postprocedure Evaluation (Signed)
  Anesthesia Post-op Note  Patient: Andrea Norton  Procedure(s) Performed: Procedure(s) (LRB): IRRIGATION AND DEBRIDEMENT KNEE WITH POLY EXCHANGE (Right)  Patient Location: PACU  Anesthesia Type: General  Level of Consciousness: awake and alert   Airway and Oxygen Therapy: Patient Spontanous Breathing  Post-op Pain: mild  Post-op Assessment: Post-op Vital signs reviewed, Patient's Cardiovascular Status Stable, Respiratory Function Stable, Patent Airway and No signs of Nausea or vomiting  Post-op Vital Signs: stable  Complications: No apparent anesthesia complications

## 2012-02-02 LAB — BASIC METABOLIC PANEL
BUN: 8 mg/dL (ref 6–23)
CO2: 26 mEq/L (ref 19–32)
Chloride: 103 mEq/L (ref 96–112)
GFR calc non Af Amer: >90 mL/min (ref >90)
Glucose, Bld: 143 mg/dL — ABNORMAL HIGH (ref 70–99)
Potassium: 4.4 mEq/L (ref 3.5–5.1)
Sodium: 136 mEq/L (ref 135–145)

## 2012-02-02 LAB — GLUCOSE, CAPILLARY
Glucose-Capillary: 112 mg/dL — ABNORMAL HIGH (ref 70–99)
Glucose-Capillary: 117 mg/dL — ABNORMAL HIGH (ref 70–99)
Glucose-Capillary: 116 mg/dL — ABNORMAL HIGH (ref 70–99)
Glucose-Capillary: 117 mg/dL — ABNORMAL HIGH (ref 70–99)

## 2012-02-02 LAB — CBC
HCT: 37.5 % (ref 36.0–46.0)
Hemoglobin: 12 g/dL (ref 12.0–15.0)
MCHC: 32 g/dL (ref 30.0–36.0)
RBC: 4.51 MIL/uL (ref 3.87–5.11)

## 2012-02-02 NOTE — Progress Notes (Signed)
Physical Therapy Treatment Patient Details Name: Andrea Norton MRN: [id number] DOB: [birth year] Today's Date: 02/02/2012 Time: 4696-2952 PT Time Calculation (min): 24 min  PT Assessment / Plan / Recommendation Comments on Treatment Session  Pt progressing well with ambulation. Now able to do SLR independently with less than 10* extension lag. KI DC'ed. Expect pt will be ready for DC in morning, will do stair training tomorrow.     Follow Up Recommendations  Home health PT    Barriers to Discharge None      Equipment Recommendations  None recommended by PT    Recommendations for Other Services    Frequency 7X/week   Plan Discharge plan remains appropriate;Frequency remains appropriate    Precautions / Restrictions Precautions Precautions: Knee Required Braces or Orthoses: Knee Immobilizer - Right Restrictions Weight Bearing Restrictions: No Other Position/Activity Restrictions: WBAT   Pertinent Vitals/Pain *5/10 R knee, pt premedicated, ice applied after PT**    Mobility  Bed Mobility Bed Mobility: Sit to Supine Supine to Sit: 4: Min assist Sit to Supine: 6: Modified independent (Device/Increase time);With rail;HOB flat Details for Bed Mobility Assistance: pt 75%, assist for supporting RLE Transfers Transfers: Sit to Stand;Stand to Sit Sit to Stand: 5: Supervision;From chair/3-in-1;With upper extremity assist;With armrests Stand to Sit: 5: Supervision;With upper extremity assist;To bed Details for Transfer Assistance: VCs hand placement Ambulation/Gait Ambulation/Gait Assistance: 5: Supervision Ambulation Distance (Feet): 240 Feet Assistive device: Rolling walker Ambulation/Gait Assistance Details: steady, no LOB, VCs for flexed neck Gait Pattern: Step-through pattern    Exercises Total Joint Exercises Ankle Circles/Pumps: AROM;Both;15 reps;Supine Quad Sets: AROM;Both;10 reps;Supine Short Arc Quad: AROM;Right;20 reps;Supine Heel Slides: AAROM;Right;10  reps;Supine Hip ABduction/ADduction: AROM;Right;10 reps;Supine Straight Leg Raises: AROM;Right;15 reps;Supine   PT Diagnosis: Difficulty walking;Acute pain  PT Problem List: Decreased range of motion;Decreased strength;Decreased activity tolerance;Pain;Decreased mobility PT Treatment Interventions: Gait training;Stair training;Functional mobility training;Therapeutic exercise;Patient/family education   PT Goals Acute Rehab PT Goals PT Goal Formulation: With patient/family Time For Goal Achievement: 02/04/12 Potential to Achieve Goals: Good Pt will go Supine/Side to Sit: with modified independence;with HOB 0 degrees;with rail PT Goal: Supine/Side to Sit - Progress: Goal set today Pt will go Sit to Supine/Side: Independently PT Goal: Sit to Supine/Side - Progress: Progressing toward goal Pt will Ambulate: >150 feet;with modified independence;with rolling walker PT Goal: Ambulate - Progress: Progressing toward goal Pt will Go Up / Down Stairs: 3-5 stairs;with min assist;with rail(s) PT Goal: Up/Down Stairs - Progress: Goal set today Pt will Perform Home Exercise Program: Independently PT Goal: Perform Home Exercise Program - Progress: Progressing toward goal  Visit Information  Last PT Received On: 02/02/12 Assistance Needed: +1    Subjective Data  Subjective: My knee feels better after I do exercises.  Patient Stated Goal: go home tomorrow   Cognition  Overall Cognitive Status: Appears within functional limits for tasks assessed/performed Arousal/Alertness: Awake/alert Orientation Level: Appears intact for tasks assessed Behavior During Session: [facility] for tasks performed    Balance     End of Session PT - End of Session Equipment Utilized During Treatment: Gait belt;Right knee immobilizer Activity Tolerance: Patient tolerated treatment well Patient left: in chair;with call bell/phone within reach;with family/visitor present Nurse Communication: Mobility status CPM Right  Knee CPM Right Knee: Off   GP     Ralene Bathe Kistler 02/02/2012, 1:42 PM [phone]

## 2012-02-02 NOTE — Op Note (Signed)
NAMEYVETTA, DROTAR NO.:  [id number]  MEDICAL RECORD NO.:  [id number]  LOCATION:  1603                         FACILITY:  [facility]  PHYSICIAN:  Ollen Gross, M.D.    DATE OF BIRTH:  Jun 29, 1946  DATE OF PROCEDURE:  02/01/2012 DATE OF DISCHARGE:                              OPERATIVE REPORT   PREOPERATIVE DIAGNOSIS:  Unstable right total knee arthroplasty.  POSTOPERATIVE DIAGNOSIS:  Unstable right total knee arthroplasty.  PROCEDURE:  Right knee tibial polyethylene revision.  SURGEON:  Ollen Gross, M.D.  ASSISTANT:  [name], PA-C  ANESTHESIA:  General.  ESTIMATED BLOOD LOSS:  Minimal.  DRAINS:  Hemovac x1.  TOURNIQUET TIME:  28 minutes at 300 mmHg.  COMPLICATIONS:  None.  CONDITION:  Stable to recovery.  BRIEF CLINICAL NOTE:  Ms. Brasher is a 66 year old female, long complex history in regards to her right knee.  She had a knee revision several years ago.  This was for instability.  She did fine initially and then recently has developed some laxity in the knee.  She presents now for polyethylene versus total knee revision.  PROCEDURE IN DETAIL:  After successful administration of general anesthetic, a tourniquet was placed high on her right thigh and her right lower extremity was prepped and draped in usual sterile fashion. Extremity was wrapped in Esmarch, knee flexed, and tourniquet inflated to 300 mmHg.  Exam under anesthesia shows global laxity.  There was no effusion noted.  Knee was flexed.  Midline incision made with a 10-blade through subcutaneous tissue to the extensor mechanism.  A fresh blade was used to make a medial parapatellar arthrotomy.  Minimal fluid was encountered in the knee.  There was a lot of hypertrophic synovium and a thorough synovectomy was performed.  The tibia was then subluxed forward.  I was able to sublux this forward with the hand surgeon.  She had a 25-mm thickness TC3 size 4 insert.  This was removed and a  30-mm trial was placed.  With the 30, the knee was reduced and she was within 5 degrees of full extension.  She had excellent varus and valgus stability.  Went down in 90 degrees of flexion.  Excellent AP stability. She had excellent stability throughout her full range of motion.  This trial was then removed.  The tibia subluxed forward and a permanent 30- mm size 4 TC3 rotating platform insert was placed in tibial tray.  This was reduced with once again excellent stability.  The wound was then copiously irrigated with 3 liters of saline using pulsatile lavage.  The extensor mechanism was then closed over Hemovac drain with a running #1 V-Loc suture.  The tourniquet was then released, total time about 24 minutes.  The wound was copiously irrigated again with saline solution, and subcu closed with interrupted 2-0 Vicryl, skin closed with staples. Catheter for Marcaine pain pump was placed and pumps initiated. Incision was cleaned and dried and a bulky sterile dressing applied. The patient was awakened and transported to recovery in stable condition.  Please note that the surgical assistant was a medical necessity in order to perform this procedure in a safe and expeditious manner.  Surgical  assistant was necessary to protect the ligaments and vital neurovascular structures to prevent injury to them.  Assistant was also necessary to retract the leg to allow for placement of the revision components.     Ollen Gross, M.D.     FA/MEDQ  D:  02/01/2012  T:  02/01/2012  Job:  [id number]

## 2012-02-02 NOTE — Progress Notes (Signed)
   Subjective: 1 Day Post-Op Procedure(s) (LRB): Right knee tibial polyethylene revision Patient reports pain as mild.   Patient seen in rounds with Dr. Lequita Halt. Patient is well, and has had no acute complaints or problems We will start therapy today.  Plan is to go Home after hospital stay.  Objective: Vital signs in last 24 hours: Temp:  [97.3 F (36.3 C)-98.3 F (36.8 C)] 97.7 F (36.5 C) (07/11 0520) Pulse Rate:  [64-83] 64  (07/11 0520) Resp:  [14-21] 16  (07/11 0520) BP: (89-148)/(52-76) 131/73 mmHg (07/11 0520) SpO2:  [90 %-100 %] 99 % (07/11 0520) Weight:  [129.729 kg (286 lb)] 129.729 kg (286 lb) (07/10 1242)  Intake/Output from previous day:  Intake/Output Summary (Last 24 hours) at 02/02/12 0838 Last data filed at 02/02/12 0520  Gross per 24 hour  Intake 3251.67 ml  Output   4120 ml  Net -868.33 ml    Intake/Output this shift: UOP 1600  Labs:  Basename 02/02/12 0430  HGB 12.0    Basename 02/02/12 0430  WBC 9.1  RBC 4.51  HCT 37.5  PLT 266    Basename 02/02/12 0430  NA 136  K 4.4  CL 103  CO2 26  BUN 8  CREATININE 0.60  GLUCOSE 143*  CALCIUM 8.8   No results found for this basename: LABPT:2,INR:2 in the last 72 hours  EXAM General - Patient is Alert, Appropriate and Oriented Extremity - Neurovascular intact Sensation intact distally Dorsiflexion/Plantar flexion intact Dressing - dressing C/D/I Motor Function - intact, moving foot and toes well on exam.  Hemovac pulled without difficulty.  Past Medical History  Diagnosis Date  . Hyperlipidemia   . Status post primary angioplasty with coronary stent     DRUG-ELUTING STENT X1  TO PROXIMAL LAD  . Chronic back pain   . Chronic neck pain   . Arthritis     HANDS, KNEES  . DDD (degenerative disc disease), cervical   . DDD (degenerative disc disease), lumbar   . Radicular pain in left arm OCCASIONAL PAIN SHOOTING DOWN LEFT ARM SECAONDARY TO  CERVICAL DEGENERATION  . Hemorrhoid   .  Diverticulitis of colon   . B12 deficiency anemia   . Vitamin d deficiency   . Hypothyroidism   . Synovitis of knee HYPERTROPHIC RIGHT KNEE  . Abnormal stress test 01-22-2009    LOW RISK ADENOSINE NUCLEAR STUDY W/ PROBABLE MILD APICAL THINNING BUT NO ISCHEMIA  . Depression     states well controlled  . Sleep apnea     couldnt tolerate machine- states last sleep study " many years ago"  . Diabetes mellitus   . GERD (gastroesophageal reflux disease)   . Inguinal hernia, right     with ventral hernia  . Coronary artery disease 2010    STRESS TEST /, NOte Dr Kirtland Bouchard EPIC/ no cardiologist now  . Hypertension     chest x ray, ekg 1/13 EPIC    PCP Dr Lysbeth Galas  No cardiologist- Last note  Dr Kirtland Bouchard 2010    Assessment/Plan: 1 Day Post-Op Procedure(s) (LRB): Right knee tibial polyethylene revision Principal Problem:  *Instability of prosthetic knee   Advance diet Up with therapy Plan for discharge tomorrow Discharge home with home health  DVT Prophylaxis - Xarelto Weight-Bearing as tolerated to right leg Keep foley until tomorrow. No vaccines. D/C O2 and Pulse OX and try on Room [address]  Andrea Norton 02/02/2012, 8:38 AM

## 2012-02-02 NOTE — Evaluation (Signed)
Physical Therapy Evaluation Patient Details Name: Andrea Norton MRN: [id number] DOB: [birth year] Today's Date: 02/02/2012 Time: 4098-1191 PT Time Calculation (min): 29 min  PT Assessment / Plan / Recommendation Clinical Impression  66 y.o. female POD #1 for revision of R tibial polyethylene. Pt doing well with mobility for POD #1, expect good progress, with DC to home with HHPT. No DME needed. Pt ambulated 200' with RW and supervision today. She would benefit from acute PT for ROM, strengthening of R knee and to progress ambulation.     PT Assessment  Patient needs continued PT services    Follow Up Recommendations  Home health PT    Barriers to Discharge None      Equipment Recommendations  None recommended by PT    Recommendations for Other Services     Frequency 7X/week    Precautions / Restrictions Precautions Precautions: Knee Required Braces or Orthoses: Knee Immobilizer - Right   Pertinent Vitals/Pain *5/10 R knee with walking Pt premedicated, ice applied after PT, RLE elevated**      Mobility  Bed Mobility Bed Mobility: Supine to Sit Supine to Sit: 4: Min assist Details for Bed Mobility Assistance: pt 75%, assist for supporting RLE Transfers Transfers: Sit to Stand;Stand to Sit Sit to Stand: From bed;4: Min guard Stand to Sit: To chair/3-in-1;4: Min guard Details for Transfer Assistance: VCs hand placement Ambulation/Gait Ambulation/Gait Assistance: 5: Supervision Ambulation Distance (Feet): 200 Feet Assistive device: Rolling walker Ambulation/Gait Assistance Details: steady, no LOB, VCs for flexed neck Gait Pattern: Step-through pattern    Exercises Total Joint Exercises Ankle Circles/Pumps: AROM;Both;15 reps;Supine Quad Sets: AROM;Both;10 reps;Supine Heel Slides: AAROM;Right;10 reps;Supine   PT Diagnosis: Difficulty walking;Acute pain  PT Problem List: Decreased range of motion;Decreased strength;Decreased activity tolerance;Pain;Decreased mobility PT  Treatment Interventions: Gait training;Stair training;Functional mobility training;Therapeutic exercise;Patient/family education   PT Goals Acute Rehab PT Goals PT Goal Formulation: With patient/family Time For Goal Achievement: 02/04/12 Potential to Achieve Goals: Good Pt will go Supine/Side to Sit: with modified independence;with HOB 0 degrees;with rail PT Goal: Supine/Side to Sit - Progress: Goal set today Pt will go Sit to Supine/Side: Independently PT Goal: Sit to Supine/Side - Progress: Goal set today Pt will Ambulate: >150 feet;with modified independence;with rolling walker PT Goal: Ambulate - Progress: Goal set today Pt will Go Up / Down Stairs: 3-5 stairs;with min assist;with rail(s) PT Goal: Up/Down Stairs - Progress: Goal set today Pt will Perform Home Exercise Program: Independently PT Goal: Perform Home Exercise Program - Progress: Goal set today  Visit Information  Last PT Received On: 02/02/12 Assistance Needed: +1    Subjective Data  Subjective: It doesn't hurt that bad, I can put all my weight on it.  Patient Stated Goal: watching great grandkids, cleaning the house   Prior Functioning  Home Living Lives With: Spouse Available Help at Discharge: Family Type of Home: House Home Access: Stairs to enter Secretary/administrator of Steps: 4 Entrance Stairs-Rails: Can reach both;Left;Right Home Layout: One level Home Adaptive Equipment: Bedside commode/3-in-1;Walker - rolling Prior Function Level of Independence: Independent Able to Take Stairs?: Yes Driving: Yes Communication Communication: No difficulties    Cognition  Overall Cognitive Status: Appears within functional limits for tasks assessed/performed Arousal/Alertness: Awake/alert Orientation Level: Appears intact for tasks assessed Behavior During Session: [facility] for tasks performed    Extremity/Trunk Assessment Right Upper Extremity Assessment RUE ROM/Strength/Tone: [facility] for tasks assessed Left Upper  Extremity Assessment LUE ROM/Strength/Tone: [facility] for tasks assessed Right Lower Extremity Assessment RLE ROM/Strength/Tone: Deficits RLE  ROM/Strength/Tone Deficits: knee flexion AAROM 40*, limited by pain RLE Sensation: WFL - Light Touch RLE Coordination: WFL - gross/fine motor Left Lower Extremity Assessment LLE ROM/Strength/Tone: Within functional levels LLE Sensation: WFL - Light Touch LLE Coordination: WFL - gross/fine motor Trunk Assessment Trunk Assessment: Normal   Balance    End of Session PT - End of Session Equipment Utilized During Treatment: Gait belt;Right knee immobilizer Activity Tolerance: Patient limited by pain;Patient limited by fatigue Patient left: in chair;with call bell/phone within reach;with family/visitor present Nurse Communication: Mobility status CPM Right Knee CPM Right Knee: Off  GP     Ralene Bathe Kistler 02/02/2012, 10:10 AM [phone]

## 2012-02-02 NOTE — Progress Notes (Signed)
UR COMPLETED  

## 2012-02-02 NOTE — Clinical Documentation Improvement (Signed)
     BMI DOCUMENTATION CLARIFICATION QUERY  THIS DOCUMENT IS NOT A PERMANENT PART OF THE MEDICAL RECORD  TO RESPOND TO THE THIS QUERY, FOLLOW THE INSTRUCTIONS BELOW:  1. If needed, update documentation for the patient's encounter via the notes activity.  2. Access this query again and click edit on the In [employer].  3. After updating, or not, click F2 to complete all highlighted (required) fields concerning your review. Select "additional documentation in the medical record" OR "no additional documentation provided".  4. Click Sign note button.  5. The deficiency will fall out of your In Basket *Please let us know if you are not able to complete this workflow by phone or e-mail (listed below).         02/02/12  Dear Dr.  Lequita Halt, F/Associates  In an effort to better capture your patient's severity of illness, reflect appropriate length of stay and utilization of resources, a review of the patient medical record has revealed the following indicators.    Based on your clinical judgment, please clarify and document in a progress note and/or discharge summary the clinical condition associated with the following supporting information:  In responding to this query please exercise your independent judgment.  The fact that a query is asked, does not imply that any particular answer is desired or expected.  Pt admitted for "RIGHT KNEE TIBIAL POLYETHYLENE REVISION"  Pt's BMI= 47.59  In setting of pt c/o "Right knee TKA instability"                       Please clarify whether or not BMI can be linked to one of he diagnoses listed below and document in pn  and d/c. Thank You!  [facility]: When linking BMI to a diagnosis please document both BMI and diagnosis together in pn for accuracy of SOI and ROM.     Possible Clinical conditions  Morbid Obesity W/ BMI= 47.59  Underweight w/BMI=  Other condition___________________  Cannot Clinically determine _____________  Risk  Factors: Right knee instability, HLD, DDD, knee revision Sign & Symptoms: Weight: 286 lbs Height 5'5" BMI= 47.59   Treatment Strict I & ) Carb Modified diet   Reviewed: additional documentation in the medical record  Thank You,  Enis Slipper  RN, BSN, CCDS Clinical Documentation Specialist [facility] Dept Pager: [phone] / E-mail: [email or web address]  Health Information Management [place]

## 2012-02-02 NOTE — Progress Notes (Signed)
Occupational therapy Note Chart reviewed. Pt states she has had multiple knee surgeries in the past and she doesn't feel she needs OT. She has help at discharge. She does request a new 3in1 if covered by insurance but if not, she requests a bucket for 3in1. Explained that pt can also check with DME stores in area about getting a bucket if needed. Informed nursing who will let case management know of requests. Judithann Sauger OTR/L [phone] 02/02/2012

## 2012-02-03 ENCOUNTER — Encounter (HOSPITAL_COMMUNITY): Payer: Self-pay | Admitting: Orthopedic Surgery

## 2012-02-03 LAB — BASIC METABOLIC PANEL
Calcium: 8.6 mg/dL (ref 8.4–10.5)
GFR calc Af Amer: >90 mL/min (ref >90)
GFR calc non Af Amer: >90 mL/min (ref >90)
Glucose, Bld: 137 mg/dL — ABNORMAL HIGH (ref 70–99)
Potassium: 4.3 mEq/L (ref 3.5–5.1)
Sodium: 137 mEq/L (ref 135–145)

## 2012-02-03 LAB — CBC
Hemoglobin: 11.2 g/dL — ABNORMAL LOW (ref 12.0–15.0)
MCH: 26.3 pg (ref 26.0–34.0)
MCHC: 31.7 g/dL (ref 30.0–36.0)
RDW: 15.4 % (ref 11.5–15.5)

## 2012-02-03 MED ORDER — METHOCARBAMOL 500 MG PO TABS: 500.0000 mg | ORAL_TABLET | Freq: Four times a day (QID) | ORAL | Status: AC | PRN

## 2012-02-03 MED ORDER — OXYCODONE HCL 5 MG PO TABS: 5.0000 mg | ORAL_TABLET | ORAL | Status: AC | PRN

## 2012-02-03 MED ORDER — RIVAROXABAN 10 MG PO TABS: 10.0000 mg | ORAL_TABLET | Freq: Every day | ORAL | Status: DC

## 2012-02-03 NOTE — Progress Notes (Signed)
Subjective: 2 Days Post-Op Procedure(s) (LRB): Right knee tibial polyethylene revision Patient reports pain as mild.   Patient seen in rounds for Dr. Lequita Halt. Patient is well, and has had no acute complaints or problems.  She can already tell a difference in her knee. Patient is ready to go home today.  Objective: Vital signs in last 24 hours: Temp:  [97.8 F (36.6 C)-98.3 F (36.8 C)] 98.3 F (36.8 C) (07/12 0515) Pulse Rate:  [67-91] 91  (07/12 0515) Resp:  [14-16] 14  (07/12 0515) BP: (92-144)/(60-83) 144/83 mmHg (07/12 0515) SpO2:  [93 %-98 %] 93 % (07/12 0515)  Intake/Output from previous day:  Intake/Output Summary (Last 24 hours) at 02/03/12 0802 Last data filed at 02/03/12 0758  Gross per 24 hour  Intake   1080 ml  Output   4900 ml  Net  -3820 ml    Intake/Output this shift: Total I/O In: -  Out: 500 [Urine:500]  Labs:  [facility] 02/03/12 0415 02/02/12 0430  HGB 11.2* 12.0    Basename 02/03/12 0415 02/02/12 0430  WBC 8.1 9.1  RBC 4.26 4.51  HCT 35.3* 37.5  PLT 277 266    Basename 02/03/12 0415 02/02/12 0430  NA 137 136  K 4.3 4.4  CL 102 103  CO2 28 26  BUN 11 8  CREATININE 0.69 0.60  GLUCOSE 137* 143*  CALCIUM 8.6 8.8   No results found for this basename: LABPT:2,INR:2 in the last 72 hours  EXAM: General - Patient is Alert, Appropriate and Oriented Extremity - Neurovascular intact Sensation intact distally Dorsiflexion/Plantar flexion intact Incision - clean, dry, no drainage, healing Motor Function - intact, moving foot and toes well on exam.   Assessment/Plan: 2 Days Post-Op Procedure(s) (LRB): Right knee tibial polyethylene revision  Procedure(s) (LRB): Right knee tibial polyethylene revision Past Medical History  Diagnosis Date  . Hyperlipidemia   . Status post primary angioplasty with coronary stent     DRUG-ELUTING STENT X1  TO PROXIMAL LAD  . Chronic back pain   . Chronic neck pain   . Arthritis     HANDS, KNEES  .  DDD (degenerative disc disease), cervical   . DDD (degenerative disc disease), lumbar   . Radicular pain in left arm OCCASIONAL PAIN SHOOTING DOWN LEFT ARM SECAONDARY TO  CERVICAL DEGENERATION  . Hemorrhoid   . Diverticulitis of colon   . B12 deficiency anemia   . Vitamin d deficiency   . Hypothyroidism   . Synovitis of knee HYPERTROPHIC RIGHT KNEE  . Abnormal stress test 01-22-2009    LOW RISK ADENOSINE NUCLEAR STUDY W/ PROBABLE MILD APICAL THINNING BUT NO ISCHEMIA  . Depression     states well controlled  . Sleep apnea     couldnt tolerate machine- states last sleep study " many years ago"  . Diabetes mellitus   . GERD (gastroesophageal reflux disease)   . Inguinal hernia, right     with ventral hernia  . Coronary artery disease 2010    STRESS TEST /, NOte Dr Kirtland Bouchard EPIC/ no cardiologist now  . Hypertension     chest x ray, ekg 1/13 EPIC    PCP Dr Lysbeth Galas  No cardiologist- Last note  Dr Kirtland Bouchard 2010   Principal Problem:  *Instability of prosthetic knee   Up with therapy Discharge home with home health Diet - Cardiac diet Follow up - Tuesday 06/16/2012 Activity - WBAT Disposition - Home Condition Upon Discharge - Good D/C Meds - See DC Summary DVT  Prophylaxis - Xarelto for two weeks  Andrea Norton 02/03/2012, 8:02 AM

## 2012-02-03 NOTE — Progress Notes (Signed)
Physical Therapy Treatment Patient Details Name: Andrea Norton MRN: [id number] DOB: Jan 17, 1946 Today's Date: 02/03/2012 Time: 6962-9528 PT Time Calculation (min): 27 min  PT Assessment / Plan / Recommendation Comments on Treatment Session  Pt is at modified independence level with mobility, PT goals met. Pt stated she doesn't need to practice stairs since this is her "8th time around" with knee surgery. R knee flexion AAROM approx 75*. R knee ext AAROM -3*.  Ready to DC home from PT standpoint.     Follow Up Recommendations  Home health PT    Barriers to Discharge        Equipment Recommendations  None recommended by PT    Recommendations for Other Services    Frequency 7X/week   Plan Discharge plan remains appropriate;Frequency remains appropriate    Precautions / Restrictions Precautions Precautions: Knee Required Braces or Orthoses: Knee Immobilizer - Right Knee Immobilizer - Right: Discontinue once straight leg raise with < 10 degree lag (KI DC'ed, pt can do SLR) Restrictions Weight Bearing Restrictions: No Other Position/Activity Restrictions: WBAT   Pertinent Vitals/Pain **6/10 right knee Ice applied, pt premedicated, RLE elevated*    Mobility  Transfers Sit to Stand: 6: Modified independent (Device/Increase time);From chair/3-in-1;With armrests;With upper extremity assist Stand to Sit: 6: Modified independent (Device/Increase time);With armrests;With upper extremity assist;To chair/3-in-1 Ambulation/Gait Ambulation/Gait Assistance: 6: Modified independent (Device/Increase time) Ambulation Distance (Feet): 300 Feet Assistive device: Rolling walker Gait Pattern: Within Functional Limits    Exercises Total Joint Exercises Ankle Circles/Pumps: AROM;Both;15 reps;Supine Quad Sets: AROM;Both;10 reps;Supine Short Arc Quad: Right;Supine;AAROM;15 reps Heel Slides: AAROM;Right;Supine;15 reps Hip ABduction/ADduction: AROM;Right;10 reps;Supine Straight Leg Raises: Right;15  reps;Supine;AAROM Long Arc Quad: AAROM;Right;10 reps;Seated Knee Flexion: AROM;Right;20 reps;Seated   PT Diagnosis:    PT Problem List:   PT Treatment Interventions:     PT Goals Acute Rehab PT Goals PT Goal Formulation: With patient/family Time For Goal Achievement: 02/04/12 Potential to Achieve Goals: Good Pt will go Supine/Side to Sit: with modified independence;with HOB 0 degrees;with rail Pt will go Sit to Supine/Side: Independently Pt will Ambulate: >150 feet;with modified independence;with rolling walker PT Goal: Ambulate - Progress: Met Pt will Go Up / Down Stairs: 3-5 stairs;with min assist;with rail(s) Pt will Perform Home Exercise Program: with min assist PT Goal: Perform Home Exercise Program - Progress: Met  Visit Information  Last PT Received On: 02/03/12 Assistance Needed: +1    Subjective Data  Subjective: I feel like I've been beat up! My arms hurt, and my knee is sore. Patient Stated Goal: go home and care for great grandkids   Cognition  Overall Cognitive Status: Appears within functional limits for tasks assessed/performed Arousal/Alertness: Awake/alert Orientation Level: Appears intact for tasks assessed Behavior During Session: [facility] for tasks performed    Balance     End of Session PT - End of Session Activity Tolerance: Patient tolerated treatment well Patient left: in chair;with call bell/phone within reach;with family/visitor present Nurse Communication: Mobility status   GP     Ralene Bathe Kistler 02/03/2012, 9:32 AM [phone]

## 2012-02-03 NOTE — Discharge Summary (Signed)
Physician Discharge Summary   Patient ID: Andrea Norton MRN: [id number] DOB/AGE: 1946/03/14 66 y.o.  Admit date: 02/01/2012 Discharge date: 02/03/2012  Primary Diagnosis: Unstable right total knee arthroplasty.  Admission Diagnoses:  Past Medical History  Diagnosis Date  . Hyperlipidemia   . Status post primary angioplasty with coronary stent     DRUG-ELUTING STENT X1  TO PROXIMAL LAD  . Chronic back pain   . Chronic neck pain   . Arthritis     HANDS, KNEES  . DDD (degenerative disc disease), cervical   . DDD (degenerative disc disease), lumbar   . Radicular pain in left arm OCCASIONAL PAIN SHOOTING DOWN LEFT ARM SECAONDARY TO  CERVICAL DEGENERATION  . Hemorrhoid   . Diverticulitis of colon   . B12 deficiency anemia   . Vitamin d deficiency   . Hypothyroidism   . Synovitis of knee HYPERTROPHIC RIGHT KNEE  . Abnormal stress test 01-22-2009    LOW RISK ADENOSINE NUCLEAR STUDY W/ PROBABLE MILD APICAL THINNING BUT NO ISCHEMIA  . Depression     states well controlled  . Sleep apnea     couldnt tolerate machine- states last sleep study " many years ago"  . Diabetes mellitus   . GERD (gastroesophageal reflux disease)   . Inguinal hernia, right     with ventral hernia  . Coronary artery disease 2010    STRESS TEST /, NOte Dr Kirtland Bouchard EPIC/ no cardiologist now  . Hypertension     chest x ray, ekg 1/13 EPIC    PCP Dr Lysbeth Galas  No cardiologist- Last note  Dr Kirtland Bouchard 2010   Discharge Diagnoses:   Principal Problem:  *Instability of prosthetic knee  Procedure:  Procedure(s) (LRB): Right knee tibial polyethylene revision  Consults: None  HPI: Andrea Norton is a 66 year old female, long complex history in regards to her right knee. She had a knee revision several years ago. This was for instability. She did fine initially and then recently has developed some laxity in the knee. She presents now for polyethylene versus total knee revision.  Laboratory Data: Hospital Outpatient Visit  on 01/23/2012  Component Date Value Range Status  . aPTT 01/23/2012 33  24 - 37 seconds Final  . WBC 01/23/2012 8.6  4.0 - 10.5 K/uL Final  . RBC 01/23/2012 4.81  3.87 - 5.11 MIL/uL Final  . Hemoglobin 01/23/2012 12.7  12.0 - 15.0 g/dL Final  . HCT 25/36/6440 39.9  36.0 - 46.0 % Final  . MCV 01/23/2012 83.0  78.0 - 100.0 fL Final  . MCH 01/23/2012 26.4  26.0 - 34.0 pg Final  . MCHC 01/23/2012 31.8  30.0 - 36.0 g/dL Final  . RDW 34/74/2595 15.2  11.5 - 15.5 % Final  . Platelets 01/23/2012 378  150 - 400 K/uL Final  . Sodium 01/23/2012 140  135 - 145 mEq/L Final  . Potassium 01/23/2012 4.3  3.5 - 5.1 mEq/L Final  . Chloride 01/23/2012 103  96 - 112 mEq/L Final  . CO2 01/23/2012 26  19 - 32 mEq/L Final  . Glucose, Bld 01/23/2012 100* 70 - 99 mg/dL Final  . BUN 63/87/5643 13  6 - 23 mg/dL Final  . Creatinine, Ser 01/23/2012 0.68  0.50 - 1.10 mg/dL Final  . Calcium 32/95/1884 9.4  8.4 - 10.5 mg/dL Final  . Total Protein 01/23/2012 7.9  6.0 - 8.3 g/dL Final  . Albumin 16/60/6301 3.4* 3.5 - 5.2 g/dL Final  . AST 60/04/9322 18  0 - 37 U/L Final  .  ALT 01/23/2012 15  0 - 35 U/L Final  . Alkaline Phosphatase 01/23/2012 99  39 - 117 U/L Final  . Total Bilirubin 01/23/2012 0.5  0.3 - 1.2 mg/dL Final  . GFR calc non Af Amer 01/23/2012 >90  >90 mL/min Final  . GFR calc Af Amer 01/23/2012 >90  >90 mL/min Final   Comment:                                 The eGFR has been calculated                          using the CKD EPI equation.                          This calculation has not been                          validated in all clinical                          situations.                          eGFR's persistently                          <90 mL/min signify                          possible Chronic Kidney Disease.  Marland Kitchen Prothrombin Time 01/23/2012 13.4  11.6 - 15.2 seconds Final  . INR 01/23/2012 1.00  0.00 - 1.49 Final  . Color, Urine 01/23/2012 YELLOW  YELLOW Final  . APPearance 01/23/2012  CLOUDY* CLEAR Final  . Specific Gravity, Urine 01/23/2012 1.029  1.005 - 1.030 Final  . pH 01/23/2012 5.5  5.0 - 8.0 Final  . Glucose, UA 01/23/2012 NEGATIVE  NEGATIVE mg/dL Final  . Hgb urine dipstick 01/23/2012 NEGATIVE  NEGATIVE Final  . Bilirubin Urine 01/23/2012 SMALL* NEGATIVE Final  . Ketones, ur 01/23/2012 NEGATIVE  NEGATIVE mg/dL Final  . Protein, ur 16/04/9603 NEGATIVE  NEGATIVE mg/dL Final  . Urobilinogen, UA 01/23/2012 0.2  0.0 - 1.0 mg/dL Final  . Nitrite 54/03/8118 NEGATIVE  NEGATIVE Final  . Leukocytes, UA 01/23/2012 TRACE* NEGATIVE Final  . MRSA, PCR 01/23/2012 NEGATIVE  NEGATIVE Final  . Staphylococcus aureus 01/23/2012 NEGATIVE  NEGATIVE Final   Comment:                                 The Xpert SA Assay (FDA                          approved for NASAL specimens                          only), is one component of                          a comprehensive surveillance  program.  It is not intended                          to diagnose infection nor to                          guide or monitor treatment.  . Squamous Epithelial / LPF 01/23/2012 FEW* RARE Final  . WBC, UA 01/23/2012 0-2  <3 WBC/hpf Final  . Crystals 01/23/2012 CA OXALATE CRYSTALS* NEGATIVE Final    Basename 02/03/12 0415 02/02/12 0430  HGB 11.2* 12.0    Basename 02/03/12 0415 02/02/12 0430  WBC 8.1 9.1  RBC 4.26 4.51  HCT 35.3* 37.5  PLT 277 266    Basename 02/03/12 0415 02/02/12 0430  NA 137 136  K 4.3 4.4  CL 102 103  CO2 28 26  BUN 11 8  CREATININE 0.69 0.60  GLUCOSE 137* 143*  CALCIUM 8.6 8.8   No results found for this basename: LABPT:2,INR:2 in the last 72 hours  X-Rays:No results found.  EKG: Orders placed in visit on 08/03/11  . EKG 12-LEAD     Hospital Course: Patient was admitted to [facility] and taken to the OR and underwent the above state procedure without complications.  Patient tolerated the procedure well and was later transferred  to the recovery room and then to the orthopaedic floor for postoperative care.  They were given PO and IV analgesics for pain control following their surgery.  They were given 24 hours of postoperative antibiotics and started on DVT prophylaxis in the form of Xarelto.   PT and OT were ordered for total joint protocol.  Discharge planning consulted to help with postop disposition and equipment needs.  Patient had a good night on the evening of surgery and started to get up OOB with therapy on day one and walked over 200 feet.  Hemovac drain was pulled without difficulty.  Continued to work with therapy into day two.  Dressing was changed on day two and the incision was healing well.  Patient was seen in rounds and was ready to go home.  Discharge Medications: Prior to Admission medications   Medication Sig Start Date End Date Taking? Authorizing Provider  DULoxetine (CYMBALTA) 60 MG capsule Take 60 mg by mouth daily with breakfast.   Yes Historical Provider, MD  furosemide (LASIX) 20 MG tablet Take 20 mg by mouth daily with breakfast.    Yes Historical Provider, MD  levothyroxine (SYNTHROID, LEVOTHROID) 150 MCG tablet Take 150 mcg by mouth daily before breakfast. Brand Name ONLY   Yes Historical Provider, MD  metFORMIN (GLUCOPHAGE) 500 MG tablet Take 500 mg by mouth daily with breakfast.   Yes Historical Provider, MD  metoprolol succinate (TOPROL-XL) 25 MG 24 hr tablet Take 25 mg by mouth daily with breakfast.    Yes Historical Provider, MD  potassium chloride SA (K-DUR,KLOR-CON) 20 MEQ tablet Take 20 mEq by mouth daily with breakfast.    Yes Historical Provider, MD  simvastatin (ZOCOR) 40 MG tablet Take 40 mg by mouth every morning.    Yes Historical Provider, MD  methocarbamol (ROBAXIN) 500 MG tablet Take 1 tablet (500 mg total) by mouth every 6 (six) hours as needed. 02/03/12 02/13/12  Zen Cedillos, PA  oxyCODONE (OXY IR/ROXICODONE) 5 MG immediate release tablet Take 1-2 tablets (5-10 mg total)  by mouth every 3 (three) hours as needed. 02/03/12 02/13/12  Jannet Calip Julien Girt, PA  rivaroxaban (XARELTO) 10 MG TABS tablet Take 1 tablet (10 mg total) by mouth daily with breakfast. Take Xarelto for two weeks, then discontinue Xarelto. Once the patient has completed the Xarelto, they may resume the 81 mg Aspirin. 02/03/12   Patrica Duel, PA    Diet: Cardiac diet Activity:WBAT Follow-up: Tuesday 02/14/2012 Disposition - Home Discharged Condition: good   Discharge Orders    Future Orders Please Complete By Expires   Diet - low sodium heart healthy      Call MD / Call 911      Comments:   If you experience chest pain or shortness of breath, CALL 911 and be transported to the hospital emergency room.  If you develope a fever above 101 F, pus (white drainage) or increased drainage or redness at the wound, or calf pain, call your surgeon's office.   Discharge instructions      Comments:   Pick up stool softner and laxative for home. Do not submerge incision under water. May shower. Continue to use ice for pain and swelling from surgery.  Take Xarelto for two weeks, then discontinue Xarelto. Once the patient has completed the Xarelto, they may resume the 81 mg Aspirin.   Constipation Prevention      Comments:   Drink plenty of fluids.  Prune juice may be helpful.  You may use a stool softener, such as Colace (over the counter) 100 mg twice a day.  Use MiraLax (over the counter) for constipation as needed.   Increase activity slowly as tolerated      Patient may shower      Comments:   You may shower without a dressing once there is no drainage.  Do not wash over the wound.  If drainage remains, do not shower until drainage stops.   Driving restrictions      Comments:   No driving until released by the physician.   Lifting restrictions      Comments:   No lifting until released by the physician.   TED hose      Comments:   Use stockings (TED hose) for 3 weeks on both leg(s).   You may remove them at night for sleeping.   Change dressing      Comments:   Change dressing daily with sterile 4 x 4 inch gauze dressing and apply TED hose. Do not submerge the incision under water.   Do not put a pillow under the knee. Place it under the heel.      Do not sit on low chairs, stoools or toilet seats, as it may be difficult to get up from low surfaces        Medication List  As of 02/03/2012  8:22 AM   STOP taking these medications         aspirin EC 81 MG tablet      ergocalciferol 50000 UNITS capsule      HYDROcodone-acetaminophen 5-500 MG per tablet      VITAMIN B-12 IJ         TAKE these medications         DULoxetine 60 MG capsule   Commonly known as: CYMBALTA   Take 60 mg by mouth daily with breakfast.      furosemide 20 MG tablet   Commonly known as: LASIX   Take 20 mg by mouth daily with breakfast.      levothyroxine 150 MCG tablet   Commonly known as: SYNTHROID, LEVOTHROID   Take 150 mcg  by mouth daily before breakfast. Brand Name ONLY      metFORMIN 500 MG tablet   Commonly known as: GLUCOPHAGE   Take 500 mg by mouth daily with breakfast.      methocarbamol 500 MG tablet   Commonly known as: ROBAXIN   Take 1 tablet (500 mg total) by mouth every 6 (six) hours as needed.      metoprolol succinate 25 MG 24 hr tablet   Commonly known as: TOPROL-XL   Take 25 mg by mouth daily with breakfast.      oxyCODONE 5 MG immediate release tablet   Commonly known as: Oxy IR/ROXICODONE   Take 1-2 tablets (5-10 mg total) by mouth every 3 (three) hours as needed.      potassium chloride SA 20 MEQ tablet   Commonly known as: K-DUR,KLOR-CON   Take 20 mEq by mouth daily with breakfast.      rivaroxaban 10 MG Tabs tablet   Commonly known as: XARELTO   Take 1 tablet (10 mg total) by mouth daily with breakfast. Take Xarelto for two weeks, then discontinue Xarelto.  Once the patient has completed the Xarelto, they may resume the 81 mg Aspirin.       simvastatin 40 MG tablet   Commonly known as: ZOCOR   Take 40 mg by mouth every morning.           Follow-up Information    Follow up with Loanne Drilling, MD. Schedule an appointment as soon as possible for a visit on 02/14/2012. (Tuesday)    Contact information:   [facility] [address] [place] [address] [phone]          Signed: Patrica Duel 02/03/2012, 8:22 AM

## 2012-02-03 NOTE — Discharge Instructions (Signed)
Dr. Ollen Gross Total Joint Specialist [facility] [address], [address] [place], [place] [address] [phone]  TOTAL KNEE REPLACEMENT POSTOPERATIVE DIRECTIONS (Polyethylene Revision Total Knee)    Knee Rehabilitation, Guidelines Following Surgery  Results after knee surgery are often greatly improved when you follow the exercise, range of motion and muscle strengthening exercises prescribed by your doctor. Safety measures are also important to protect the knee from further injury. Any time any of these exercises cause you to have increased pain or swelling in your knee joint, decrease the amount until you are comfortable again and slowly increase them. If you have problems or questions, call your caregiver or physical therapist for advice.   HOME CARE INSTRUCTIONS  Remove items at home which could result in a fall. This includes throw rugs or furniture in walking pathways.  Continue medications as instructed at time of discharge. You may have some home medications which will be placed on hold until you complete the course of blood thinner medication.  You may start showering once you are discharged home but do not submerge the incision under water. Just pat the incision dry and apply a dry gauze dressing on daily. Walk with walker as instructed.   Use walker as long as suggested by your caregivers.  Avoid periods of inactivity such as sitting longer than an hour when not asleep. This helps prevent blood clots.  You may put full weight on your legs and walk as much as is comfortable.  You may resume a sexual relationship in one month or when given the OK by your doctor.  You may return to work once you are cleared by your doctor.  Do not drive a car for 6 weeks or until released by you surgeon.   Do not drive while taking narcotics.  Wear the elastic stockings for three weeks following surgery during the day but you may remove then at night. Make sure you keep  all of your appointments after your operation with all of your doctors and caregivers. You should call the office at the above phone number and make an appointment for approximately two weeks after the date of your surgery. Change the dressing daily and reapply a dry dressing each time. Please pick up a stool softener and laxative for home use as long as you are requiring pain medications.  Continue to use ice on the knee for pain and swelling from surgery. You may notice swelling that will progress down to the foot and ankle.  This is normal after surgery.  Elevate the leg when you are not up walking on it.   It is important for you to complete the blood thinner medication as prescribed by your doctor.  Continue to use the breathing machine which will help keep your temperature down.  It is common for your temperature to cycle up and down following surgery, especially at night when you are not up moving around and exerting yourself.  The breathing machine keeps your lungs expanded and your temperature down.  RANGE OF MOTION AND STRENGTHENING EXERCISES  Rehabilitation of the knee is important following a knee injury or an operation. After just a few days of immobilization, the muscles of the thigh which control the knee become weakened and shrink (atrophy). Knee exercises are designed to build up the tone and strength of the thigh muscles and to improve knee motion. Often times heat used for twenty to thirty minutes before working out will loosen up your tissues and help  with improving the range of motion but do not use heat for the first two weeks following surgery. These exercises can be done on a training (exercise) mat, on the floor, on a table or on a bed. Use what ever works the best and is most comfortable for you Knee exercises include:  Leg Lifts - While your knee is still immobilized in a splint or cast, you can do straight leg raises. Lift the leg to 60 degrees, hold for 3 sec, and slowly lower  the leg. Repeat 10-20 times 2-3 times daily. Perform this exercise against resistance later as your knee gets better.  Quad and Hamstring Sets - Tighten up the muscle on the front of the thigh (Quad) and hold for 5-10 sec. Repeat this 10-20 times hourly. Hamstring sets are done by pushing the foot backward against an object and holding for 5-10 sec. Repeat as with quad sets.  A rehabilitation program following serious knee injuries can speed recovery and prevent re-injury in the future due to weakened muscles. Contact your doctor or a physical therapist for more information on knee rehabilitation.   SKILLED REHAB INSTRUCTIONS: If the patient is transferred to a skilled rehab facility following release from the hospital, a list of the current medications will be sent to the facility for the patient to continue.  When discharged from the skilled rehab facility, please have the facility set up the patient's Home Health Physical Therapy prior to being released. Also, the skilled facility will be responsible for providing the patient with their medications at time of release from the facility to include their pain medication, the muscle relaxants, and their blood thinner medication. If the patient is still at the rehab facility at time of the two week follow up appointment, the skilled rehab facility will also need to assist the patient in arranging follow up appointment in our office and any transportation needs.  MAKE SURE YOU:  Understand these instructions.  Will watch your condition.  Will get help right away if you are not doing well or get worse.  Document Released: 07/11/2005 Document Revised: 06/30/2011 Document Reviewed: 12/29/2006  [facility] Patient Information 2012 [place], [place].   Take Xarelto for two weeks, then discontinue Xarelto. Once the patient has completed the Xarelto, they may resume the 81 mg Aspirin.

## 2012-02-03 NOTE — Care Management Note (Signed)
    Page 1 of 2   02/03/2012     5:08:34 PM   CARE MANAGEMENT NOTE 02/03/2012  Patient:  Andrea Norton, Andrea Norton   Account Number:  [id number]  Date Initiated:  02/03/2012  Documentation initiated by:  Colleen Can  Subjective/Objective Assessment:   dx right toal knee instability; polyethylene revision     Action/Plan:   Home with [facility] services /prior set up with Genev[facility]lander before admission/services will start tomorrow 02/04/2012.has dme   Anticipated DC Date:  02/03/2012   Anticipated DC Plan:  HOME W HOME HEALTH SERVICES  In-house referral  NA      DC Planning Services  NA      [facility] Choice  HOME HEALTH   Choice offered to / List presented to:     DME arranged  NA      DME agency  NA     HH arranged  HH-2 PT      [facility] agency  [facility]   Status of service:  Completed, signed off Medicare Important Message given?   (If response is "NO", the following Medicare IM given date fields will be blank) Date Medicare IM given:   Date Additional Medicare IM given:    Discharge Disposition:  HOME W HOME HEALTH SERVICES  Per UR Regulation:    If discussed at Long Length of Stay Meetings, dates discussed:    Comments:

## 2012-02-20 ENCOUNTER — Ambulatory Visit: Payer: Medicare Other | Admitting: Physical Therapy

## 2012-02-21 ENCOUNTER — Ambulatory Visit: Payer: Medicare Other | Admitting: Physical Therapy

## 2012-02-22 ENCOUNTER — Encounter: Payer: Medicare Other | Admitting: Physical Therapy

## 2012-02-24 ENCOUNTER — Ambulatory Visit: Payer: Medicare Other | Admitting: Physical Therapy

## 2012-05-15 ENCOUNTER — Encounter (INDEPENDENT_AMBULATORY_CARE_PROVIDER_SITE_OTHER): Payer: Self-pay | Admitting: General Surgery

## 2012-07-05 ENCOUNTER — Ambulatory Visit (INDEPENDENT_AMBULATORY_CARE_PROVIDER_SITE_OTHER): Payer: Medicare Other | Admitting: Surgery

## 2012-07-05 ENCOUNTER — Encounter (INDEPENDENT_AMBULATORY_CARE_PROVIDER_SITE_OTHER): Payer: Self-pay | Admitting: Surgery

## 2012-07-05 VITALS — BP 142/80 | HR 64 | Temp 97.9°F | Resp 18 | Ht 65.0 in | Wt 271.4 lb

## 2012-07-05 DIAGNOSIS — Z8719 Personal history of other diseases of the digestive system: Secondary | ICD-10-CM

## 2012-07-05 NOTE — Progress Notes (Signed)
Chief Complaint:  Morbid obesity and 2 large ventral hernias  History of Present Illness:  Andrea Norton is an 66 y.o. female who comes in with her husband today to discuss 2 ventral hernias that have been getting bigger and causing some pain. In the past she has been referred down to [facility] to consider gastric bypass surgery and went to the workup fair and decided against that. She's also spoke with me about having a lap band. Her main problem has been multiple surgeries on her right leg including a recent correctional knee replacement by Dr. Berton Lan.      This hernia would pose significant risk not only in the perioperative period but also with her current weight with probably fail.  I think that her best advice would be to have a gastric bypass to help her glucose management and also to help her lose weight so that ultimately her hernias can be repaired. I will turn her chart over to Leandrew Koyanagi for insurance verification.    Past Medical History  Diagnosis Date  . Hyperlipidemia   . Status post primary angioplasty with coronary stent     DRUG-ELUTING STENT X1  TO PROXIMAL LAD  . Chronic back pain   . Chronic neck pain   . Arthritis     HANDS, KNEES  . DDD (degenerative disc disease), cervical   . DDD (degenerative disc disease), lumbar   . Radicular pain in left arm OCCASIONAL PAIN SHOOTING DOWN LEFT ARM SECAONDARY TO  CERVICAL DEGENERATION  . Hemorrhoid   . Diverticulitis of colon   . B12 deficiency anemia   . Vitamin D deficiency   . Hypothyroidism   . Synovitis of knee HYPERTROPHIC RIGHT KNEE  . Abnormal stress test 01-22-2009    LOW RISK ADENOSINE NUCLEAR STUDY W/ PROBABLE MILD APICAL THINNING BUT NO ISCHEMIA  . Depression     states well controlled  . Sleep apnea     couldnt tolerate machine- states last sleep study " many years ago"  . Diabetes mellitus   . GERD (gastroesophageal reflux disease)   . Inguinal hernia, right     with ventral hernia  . Coronary artery  disease 2010    STRESS TEST /, NOte Dr Kirtland Bouchard EPIC/ no cardiologist now  . Hypertension     chest x ray, ekg 1/13 EPIC    PCP Dr Lysbeth Galas  No cardiologist- Last note  Dr Kirtland Bouchard 2010    Past Surgical History  Procedure Date  . Knee closed reduction 05-07-2009  RIGHT KNEE    LEFT KNEE  10-01-2009  . Total knee arthroplasty LEFT  2005    RIGHT  03-13-2009  . Total knee revision RIGHT 09-30-2009    LEFT 11-25-2009  . Hysteroscopy w/d&c 2002  . Open patellofemoral right knee/ lateral release 05-04-2009  . Knee arthroscopy w/ debridement 01-22-2010    SEPTIC KNEE  . Right knee i & d polythylene revision 02-04-2010    KNEE INFECTED  . Left foot surg. 1990  . Appendectomy 1993  . Knee arthroscopy LEFT 1999    RIGHT 2004  . Hemicolectomy for diverticulitis 2005  . Knee arthroscopy 08/03/2011    Procedure: ARTHROSCOPY KNEE;  Surgeon: Loanne Drilling;  Location: [facility];  Service: Orthopedics;  Laterality: Right;  . Synovectomy 08/03/2011    Procedure: SYNOVECTOMY;  Surgeon: Loanne Drilling;  Location: [facility];  Service: Orthopedics;  Laterality: Right;  . Joint replacement   . Coronary angioplasty 2004  DRUG-ELUTING STENT X1 TO PROXIMAL  LAD  . I&d knee with poly exchange 02/01/2012    Procedure: IRRIGATION AND DEBRIDEMENT KNEE WITH POLY EXCHANGE;  Surgeon: Loanne Drilling, MD;  Location: [facility];  Service: Orthopedics;  Laterality: Right;  Right Knee Polyethlene Revision     Current Outpatient Prescriptions  Medication Sig Dispense Refill  . aspirin 81 MG tablet Take 81 mg by mouth daily.      Marland Kitchen escitalopram (LEXAPRO) 10 MG tablet Take 10 mg by mouth daily.      . furosemide (LASIX) 20 MG tablet Take 20 mg by mouth daily with breakfast.       . ketoconazole (NIZORAL) 2 % cream       . levothyroxine (SYNTHROID, LEVOTHROID) 150 MCG tablet Take 150 mcg by mouth daily before breakfast. Brand Name ONLY      . metFORMIN (GLUCOPHAGE) 500 MG tablet Take  500 mg by mouth daily with breakfast.      . metoprolol succinate (TOPROL-XL) 25 MG 24 hr tablet Take 25 mg by mouth daily with breakfast.       . oxyCODONE (OXY IR/ROXICODONE) 5 MG immediate release tablet Ad lib.      . pantoprazole (PROTONIX) 40 MG tablet Take 40 mg by mouth daily.      . potassium chloride SA (K-DUR,KLOR-CON) 20 MEQ tablet Take 20 mEq by mouth daily with breakfast.       . silver sulfADIAZINE (SILVADENE) 1 % cream       . simvastatin (ZOCOR) 40 MG tablet Take 40 mg by mouth every morning.       . triamcinolone cream (KENALOG) 0.1 %       . vitamin B-12 (CYANOCOBALAMIN) 500 MCG tablet Take 500 mcg by mouth daily.       Review of patient's allergies indicates no known allergies. Family History  Problem Relation Age of Onset  . COPD Mother   . Cancer Father     lung   Social History:   reports that she quit smoking about 9 years ago. Her smoking use included Cigarettes. She quit after 39 years of use. She has never used smokeless tobacco. She reports that she does not drink alcohol or use illicit drugs.   REVIEW OF SYSTEMS - PERTINENT POSITIVES ONLY: Noncontributory at the present  Physical Exam:   Blood pressure 142/80, pulse 64, temperature 97.9 F (36.6 C), temperature source Temporal, resp. rate 18, height 5\' 5"  (1.651 m), weight 271 lb 6.4 oz (123.106 kg). Body mass index is 45.16 kg/(m^2).  Gen:  WDWN white female NAD  Neurological: Alert and oriented to person, place, and time. Motor and sensory function is grossly intact  Head: Normocephalic and atraumatic.  Eyes: Conjunctivae are normal. Pupils are equal, round, and reactive to light. No scleral icterus.  Neck: Normal range of motion. Neck supple. No tracheal deviation or thyromegaly present.  Cardiovascular:  SR without murmurs or gallops.  No carotid bruits Respiratory: Effort normal.  No respiratory distress. No chest wall tenderness. Breath sounds normal.  No wheezes, rales or rhonchi.  Abdomen:  Long  midline incision from previous colon surgery with a large left of midline ventral hernia in the upper abdomen and a lower midline on the right side. These are easily reducible. GU: Musculoskeletal: Normal range of motion. Extremities are nontender. No cyanosis, edema or clubbing noted Lymphadenopathy: No cervical, preauricular, postauricular or axillary adenopathy is present Skin: Skin is warm and dry. No rash noted. No diaphoresis. No erythema.  No pallor. Pscyh: Normal mood and affect. Behavior is normal. Judgment and thought content normal.   LABORATORY RESULTS: No results found for this or any previous visit (from the past 48 hour(s)).  RADIOLOGY RESULTS: No results found.  Problem List: Patient Active Problem List  Diagnosis  . HYPOTHYROIDISM  . DYSLIPIDEMIA  . OBESITY, MORBID  . HYPERTENSION  . CAD  . GASTROESOPHAGEAL REFLUX DISEASE  . DEGENERATIVE JOINT DISEASE  . SLEEP APNEA  . DYSPNEA  . DIVERTICULITIS, HX OF  . Instability of prosthetic knee    Assessment & Plan: Morbid obesity and ventral hernias. Will see if we can't address her obesity issues which would in turn help her knee problems as well as her ventral hernia issues. We'll try to get her certify for a Roux-en-Y gastric bypass which she is Rh were and understands well.    Matt B. Daphine Deutscher, MD, [facility] [phone] beeper [phone]  07/05/2012 11:21 AM

## 2012-07-05 NOTE — Patient Instructions (Addendum)
Andrea Norton will look into your insurance benefits and call you

## 2012-10-02 ENCOUNTER — Other Ambulatory Visit (HOSPITAL_COMMUNITY): Payer: Self-pay | Admitting: Orthopedic Surgery

## 2012-10-02 DIAGNOSIS — M25561 Pain in right knee: Secondary | ICD-10-CM

## 2012-10-03 ENCOUNTER — Encounter (HOSPITAL_COMMUNITY)
Admission: RE | Admit: 2012-10-03 | Discharge: 2012-10-03 | Disposition: A | Discharge status: Home / Self Care | Payer: Medicare Other | Source: Ambulatory Visit | Attending: Orthopedic Surgery | Admitting: Orthopedic Surgery

## 2012-10-03 ENCOUNTER — Encounter (HOSPITAL_COMMUNITY): Payer: Self-pay

## 2012-10-03 DIAGNOSIS — M25561 Pain in right knee: Secondary | ICD-10-CM

## 2012-10-03 DIAGNOSIS — R937 Abnormal findings on diagnostic imaging of other parts of musculoskeletal system: Secondary | ICD-10-CM | POA: Insufficient documentation

## 2012-10-03 DIAGNOSIS — M25569 Pain in unspecified knee: Secondary | ICD-10-CM | POA: Insufficient documentation

## 2012-10-03 DIAGNOSIS — Z96659 Presence of unspecified artificial knee joint: Secondary | ICD-10-CM | POA: Insufficient documentation

## 2012-10-03 MED ORDER — TECHNETIUM TC 99M MEDRONATE IV KIT
25.0000 | PACK | Freq: Once | INTRAVENOUS | Status: AC | PRN
  Administered 2012-10-03: 24 via INTRAVENOUS

## 2012-10-04 ENCOUNTER — Telehealth (INDEPENDENT_AMBULATORY_CARE_PROVIDER_SITE_OTHER): Payer: Self-pay | Admitting: Surgery

## 2012-10-04 NOTE — Telephone Encounter (Signed)
I emailed the patient to determine if she has current insurance coverage. I called [employer] per Dr. Ermalene Searing instructions to see if RNY gastric bypass surgery was covered and the representative advised that the policy termed on 08/24/12. cef 10/04/12

## 2013-02-08 ENCOUNTER — Encounter (HOSPITAL_COMMUNITY): Payer: Self-pay | Admitting: Pharmacy Technician

## 2013-02-10 ENCOUNTER — Other Ambulatory Visit: Payer: Self-pay | Admitting: Orthopedic Surgery

## 2013-02-10 MED ORDER — DEXAMETHASONE SODIUM PHOSPHATE 10 MG/ML IJ SOLN: 10.0000 mg | Freq: Once | INTRAMUSCULAR | Status: DC

## 2013-02-10 MED ORDER — BUPIVACAINE LIPOSOME 1.3 % IJ SUSP: 20.0000 mL | Freq: Once | INTRAMUSCULAR | Status: DC

## 2013-02-10 NOTE — Progress Notes (Signed)
Preoperative surgical orders have been place into the Epic hospital system for Andrea Norton on 02/10/2013, 10:14 AM  by Patrica Duel for surgery on 02/20/13.  Preop Revision Total Knee orders including Experal, IV Tylenol, and IV Decadron as long as there are no contraindications to the above medications. [name], PA-C

## 2013-02-12 ENCOUNTER — Ambulatory Visit (HOSPITAL_COMMUNITY)
Admission: RE | Admit: 2013-02-12 | Discharge: 2013-02-12 | Disposition: A | Discharge status: Home / Self Care | Payer: Medicare Other | Source: Ambulatory Visit | Attending: Orthopedic Surgery | Admitting: Orthopedic Surgery

## 2013-02-12 ENCOUNTER — Encounter (HOSPITAL_COMMUNITY): Payer: Self-pay

## 2013-02-12 ENCOUNTER — Encounter (HOSPITAL_COMMUNITY)
Admission: RE | Admit: 2013-02-12 | Discharge: 2013-02-12 | Disposition: A | Discharge status: Home / Self Care | Payer: Medicare Other | Source: Ambulatory Visit | Attending: Orthopedic Surgery | Admitting: Orthopedic Surgery

## 2013-02-12 DIAGNOSIS — Z01812 Encounter for preprocedural laboratory examination: Secondary | ICD-10-CM | POA: Insufficient documentation

## 2013-02-12 DIAGNOSIS — T84099A Other mechanical complication of unspecified internal joint prosthesis, initial encounter: Secondary | ICD-10-CM | POA: Insufficient documentation

## 2013-02-12 DIAGNOSIS — E65 Localized adiposity: Secondary | ICD-10-CM | POA: Insufficient documentation

## 2013-02-12 DIAGNOSIS — Z96659 Presence of unspecified artificial knee joint: Secondary | ICD-10-CM | POA: Insufficient documentation

## 2013-02-12 DIAGNOSIS — Y831 Surgical operation with implant of artificial internal device as the cause of abnormal reaction of the patient, or of later complication, without mention of misadventure at the time of the procedure: Secondary | ICD-10-CM | POA: Insufficient documentation

## 2013-02-12 DIAGNOSIS — I10 Essential (primary) hypertension: Secondary | ICD-10-CM | POA: Insufficient documentation

## 2013-02-12 DIAGNOSIS — E119 Type 2 diabetes mellitus without complications: Secondary | ICD-10-CM | POA: Insufficient documentation

## 2013-02-12 DIAGNOSIS — Z01818 Encounter for other preprocedural examination: Secondary | ICD-10-CM | POA: Insufficient documentation

## 2013-02-12 HISTORY — DX: Unspecified hemorrhoids: K64.9

## 2013-02-12 LAB — CBC
Platelets: 310 10*3/uL (ref 150–400)
RBC: 4.89 MIL/uL (ref 3.87–5.11)
RDW: 14.4 % (ref 11.5–15.5)
WBC: 7.5 10*3/uL (ref 4.0–10.5)

## 2013-02-12 LAB — COMPREHENSIVE METABOLIC PANEL
ALT: 14 U/L (ref 0–35)
AST: 16 U/L (ref 0–37)
Albumin: 3.2 g/dL — ABNORMAL LOW (ref 3.5–5.2)
CO2: 29 mEq/L (ref 19–32)
Chloride: 103 mEq/L (ref 96–112)
GFR calc non Af Amer: 89 mL/min — ABNORMAL LOW (ref >90)
Potassium: 4.6 mEq/L (ref 3.5–5.1)
Sodium: 139 mEq/L (ref 135–145)
Total Bilirubin: 0.6 mg/dL (ref 0.3–1.2)

## 2013-02-12 LAB — URINALYSIS, ROUTINE W REFLEX MICROSCOPIC
Glucose, UA: NEGATIVE mg/dL
Ketones, ur: NEGATIVE mg/dL
Nitrite: POSITIVE — AB
Protein, ur: NEGATIVE mg/dL
pH: 7.5 (ref 5.0–8.0)

## 2013-02-12 LAB — SURGICAL PCR SCREEN: MRSA, PCR: NEGATIVE

## 2013-02-12 LAB — APTT: aPTT: 31 seconds (ref 24–37)

## 2013-02-12 LAB — URINE MICROSCOPIC-ADD ON

## 2013-02-12 NOTE — Patient Instructions (Signed)
YOUR SURGERY IS SCHEDULED AT [facility]  ON:  Wednesday  7/30  REPORT TO [facility] AT:  5:15 AM      PHONE # FOR SHORT STAY IS [phone]  DO NOT EAT OR DRINK ANYTHING AFTER MIDNIGHT THE NIGHT BEFORE YOUR SURGERY.  YOU MAY BRUSH YOUR TEETH, RINSE OUT YOUR MOUTH--BUT NO WATER, NO FOOD, NO CHEWING GUM, NO MINTS, NO CANDIES, NO CHEWING TOBACCO.  PLEASE TAKE THE FOLLOWING MEDICATIONS THE AM OF YOUR SURGERY WITH A FEW SIPS OF WATER:  GABAPENTIN, LEVOTHYROXINE, METOPROLOL, SIMVASTATIN    IF YOU ARE DIABETIC:  DO NOT TAKE ANY DIABETIC MEDICATIONS THE AM OF YOUR SURGERY.  IF YOU TAKE INSULIN IN THE EVENINGS--PLEASE ONLY TAKE 1/2 NORMAL EVENING DOSE THE NIGHT BEFORE YOUR SURGERY.  NO INSULIN THE AM OF YOUR SURGERY.    DO NOT BRING VALUABLES, MONEY, CREDIT CARDS.  DO NOT WEAR JEWELRY, MAKE-UP, NAIL POLISH AND NO METAL PINS OR CLIPS IN YOUR HAIR. CONTACT LENS, DENTURES / PARTIALS, GLASSES SHOULD NOT BE WORN TO SURGERY AND IN MOST CASES-HEARING AIDS WILL NEED TO BE REMOVED.  BRING YOUR GLASSES CASE, ANY EQUIPMENT NEEDED FOR YOUR CONTACT LENS. FOR PATIENTS ADMITTED TO THE HOSPITAL--CHECK OUT TIME THE DAY OF DISCHARGE IS 11:00 AM.  ALL INPATIENT ROOMS ARE PRIVATE - WITH BATHROOM, TELEPHONE, TELEVISION AND WIFI INTERNET.                              PLEASE READ OVER ANY  FACT SHEETS THAT YOU WERE GIVEN: MRSA INFORMATION, BLOOD TRANSFUSION INFORMATION, INCENTIVE SPIROMETER INFORMATION. FAILURE TO FOLLOW THESE INSTRUCTIONS MAY RESULT IN THE CANCELLATION OF YOUR SURGERY.   PATIENT SIGNATURE_________________________________

## 2013-02-12 NOTE — Pre-Procedure Instructions (Signed)
EKG AND CXR WERE DONE TODAY PREOP AT [facility] AS PER ANESTHESIOLOGIST'S GUIDELINES. 

## 2013-02-13 NOTE — Pre-Procedure Instructions (Signed)
PT'S PREOP URINALYSIS, URINE MICROSCOPIC AND URINE CULTURE IN PROCESS REPORTS FAXED TO DR. ALUISIO'S OFFICE.

## 2013-02-14 LAB — URINE CULTURE: Colony Count: >=100000

## 2013-02-14 NOTE — Pre-Procedure Instructions (Signed)
URINE CULTURE REPORT FAXED TO DR. ALUISIO'S OFFICE.

## 2013-02-15 ENCOUNTER — Other Ambulatory Visit: Payer: Self-pay | Admitting: Orthopedic Surgery

## 2013-02-15 NOTE — H&P (Signed)
Andrea Norton  DOB: 04/20/1946 Married / Language: [place] / Race: White Female  Date of Admission:  02/20/2013  Chief Complaint:  Failed right total knee with pain  History of Present Illness(Andrea Norton, III PA-C; 02/05/2013 10:40 AM) The patient is a 67 year old female who comes in for a preoperative History and Physical. The patient is scheduled for a right revision right total knee versus knee resection and placement of antibotic spacer to be performed by Dr. Gus Rankin. Aluisio, MD at [facility] on 02/20/2013. The patient is a 67 year old female presenting for a post-operative visit. The patient comes in today 1 year out from right total knee arthroplasty (revision). The patient states that she is getting worse at this time. The pain is under poor control at this time and describe their pain as severe and she has been taking Aleve for pain (she states pain meds don't help at all). The patient feels that they are progressing poorly at this time. Note for "Post TKA": She had a bone scan done 10/03/12. She is scheduled to have a revision of her total knee. It is going to be revision versus resection arthroplasty. She has a remote history of infection but has not had any signs or symptoms of infection for several years. Blood work had been unremarkable. She states she has had increased pain in the lower leg recently. It is mainly pretibial pain. On her bone scan the tibial component was what was showing more activity. She is ready to proceed with surgery. They have been treated conservatively in the past for the above stated problem and despite conservative measures, they continue to have progressive pain and severe functional limitations and dysfunction. They have failed non-operative management including home exercise, medications. It is felt that they would benefit from undergoing total joint replacement. Risks and benefits of the procedure have been discussed with the  patient and they elect to proceed with surgery. There are no active contraindications to surgery such as ongoing infection or rapidly progressive neurological disease.   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 03/18/2010   Allergies Morphine Sulfate (Concentrate) *ANALGESICS - OPIOID*. Did not help with her pain   Family History Cancer. father Chronic Obstructive Lung Disease. mother Cerebrovascular Accident. brother Heart Disease. brother Osteoarthritis. mother Hypertension. mother and sister Osteoporosis. mother and sister   Social History Tobacco use. Former smoker. former smoker; smoke(d) 1 pack(s) per day Tobacco / smoke exposure. yes Number of flights of stairs before winded. greater than 5 Drug/Alcohol Rehab (Previously). no Drug/Alcohol Rehab (Currently). no Illicit drug use. no Marital status. married Living situation. live with spouse Alcohol use. Never consumed alcohol. former drinker Current work status. disabled Children. 1 Post-Surgical Plans. Home   Medication History Metoprolol Succinate (50MG  Tablet ER 24HR, Oral) Active. Potassium Chloride ( Tablet ER, Oral) Active. Simvastatin (40MG  Tablet, Oral) Active. Synthroid ( Tablet, Oral) Active. Furosemide (20MG  Tablet, Oral) Active. Pantoprazole Sodium (40MG  Tablet DR, Oral) Active. Vitamin D (Ergocalciferol) (50000UNIT Capsule, Oral) Active. Gabapentin (100MG  Capsule, Oral) Active. Cyanocobalamin (100MCG/ML Solution, Injection monthly) Active. Escitalopram Oxalate (10MG  Tablet, Oral) Active. Aspirin EC (81MG  Tablet DR, Oral) Active. Metoprolol Tartrate (25MG  Tablet, Oral) Active. MetFORMIN HCl ER (500MG  Tablet ER 24HR, Oral) Active. Protonix ( Oral) Specific dose unknown - Active. B12 Liquid Health  Booster (1000MCG/15ML Liquid, Oral monthly) Active.  Past Surgical History  Colectomy. Date: 2005. partial Total Knee Replacement. bilateral Foot Surgery. left Arthroscopic Knee Surgery - Right Knee Replacement, Total. right X2 as well as left History of cardiac catheterization (V45.89). Angioplasty with stenting (1 stent) Arthroscopy of Knee. bilateral Appendectomy   Medical History Menopause Diabetes Mellitus, Type II Coronary Artery Disease/Heart Disease Hiatal Hernia Urinary Tract Infection. History of Hypothyroidism Diverticulosis Gastroesophageal Reflux Disease Hemorrhoids. Recurrent bleeding Sleep Apnea Hypercholesterolemia High blood pressure Depression   Review of Systems General:Present- Night Sweats and Fatigue. Not Present- Chills, Fever, Appetite Loss, Feeling sick, Weight Gain and Weight Loss. Skin:Present- Itching. Not Present- Rash, Skin Color Changes, Ulcer, Psoriasis and Change in Hair or Nails. HEENT:Not Present- Sensitivity to light, Hearing problems, Nose Bleed and Ringing in the Ears. Neck:Not Present- Swollen Glands and Neck Mass. Respiratory:Not Present- Snoring, Chronic Cough, Bloody sputum and Dyspnea. Cardiovascular:Present- Swelling of Extremities and Leg Cramps. Not Present- Shortness of Breath, Chest Pain and Palpitations. Gastrointestinal:Present- Abdominal Pain and Nausea. Not Present- Bloody Stool, Heartburn, Vomiting and Incontinence of Stool. Female Genitourinary:Present- Frequency. Not Present- Blood in Urine, Menstrual Irregularities, Incontinence and Nocturia. Musculoskeletal:Present- Joint Stiffness, Joint Pain and Back Pain. Not Present- Muscle Weakness, Muscle Pain and Joint Swelling. Neurological:Present- Tingling, Numbness and Tremor. Not Present- Burning, Headaches and Dizziness. Psychiatric:Present- Depression. Not Present- Anxiety and Memory Loss. Endocrine:Present- Heat Intolerance and Excessive Thirst. Not  Present- Cold Intolerance and Excessive hunger. Hematology:Present- Anemia. Not Present- Abnormal Bleeding, Blood Clots and Easy Bruising.   Vitals Weight: 280 lb Height: 63 in Body Surface Area: 2.38 m Body Mass Index: 49.6 kg/m Pulse: 88 (Regular) Resp.: 14 (Unlabored) BP: 132/78 (Sitting, Right Arm, Standard)   Physical Exam The physical exam findings are as follows:  Note: Patient is a 67 year old female with continued instability of the right knee. Patient is accompanied today by her husband Rayna Sexton.   General Mental Status - Alert, cooperative and good historian. General Appearance- pleasant. Not in acute distress. Orientation- Oriented X3. Build & Nutrition- Well nourished and Well developed.   Head and Neck Head- normocephalic, atraumatic . Neck Global Assessment- supple. no bruit auscultated on the right and no bruit auscultated on the left.   Eye Pupil- Bilateral- Regular and Round. Motion- Bilateral- EOMI. wears contacts  Chest and Lung Exam Auscultation: Breath sounds:- clear at anterior chest wall and - clear at posterior chest wall. Adventitious sounds:- No Adventitious sounds.   Cardiovascular Auscultation:Rhythm- Regular rate and rhythm. Heart Sounds- S1 WNL and S2 WNL. Murmurs & Other Heart Sounds:Auscultation of the heart reveals - No Murmurs.   Abdomen Palpation/Percussion:Tenderness- Abdomen is non-tender to palpation. Rigidity (guarding)- Abdomen is soft. Auscultation:Auscultation of the abdomen reveals - Bowel sounds normal.   Female Genitourinary Not done, not pertinent to present illness  Musculoskeletal On exam she is alert and oriented in no apparent distress. On exam she is in no distress. Her right knee shows no effusion. She is tender over the proximal tibia. Her range of motion is about 0 to 120. She does not have any gross instability. There is very mild AP laxity.   RADIOGRAPHS: AP  and lateral of the knee show revision prosthesis with no evidence of any periprosthetic fractures. She does appear to have some tibial loosening but no migration of the tibial component.  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 Lequita Halt.  Plan is to go home.  The patient  will not receive TXA (tranexamic acid) due to: Coronary Arterial Disease  PCP - Dr. Joette Catching - Patient has been seen preoperatively and felt to be stable for surgery. She was given a verbal okay by their office.  Signed electronically by Lauraine Rinne, III PA-C

## 2013-02-15 NOTE — Pre-Procedure Instructions (Signed)
FAXED NOTE RECEIVED BACK FROM DR. ALUISIO'S OFFICE - PT BEING TREATED WITH CIPRO FOR UTI

## 2013-02-20 ENCOUNTER — Encounter (HOSPITAL_COMMUNITY)
Admission: RE | Disposition: A | Discharge status: Home Health | Payer: Self-pay | Source: Ambulatory Visit | Attending: Orthopedic Surgery

## 2013-02-20 ENCOUNTER — Encounter (HOSPITAL_COMMUNITY): Payer: Self-pay | Admitting: Anesthesiology

## 2013-02-20 ENCOUNTER — Inpatient Hospital Stay (HOSPITAL_COMMUNITY): Payer: Medicare Other | Admitting: Anesthesiology

## 2013-02-20 ENCOUNTER — Inpatient Hospital Stay (HOSPITAL_COMMUNITY)
Admission: RE | Admit: 2013-02-20 | Discharge: 2013-02-22 | DRG: 467 | Disposition: A | Discharge status: Home Health | Payer: Medicare Other | Source: Ambulatory Visit | Attending: Orthopedic Surgery | Admitting: Orthopedic Surgery

## 2013-02-20 ENCOUNTER — Encounter (HOSPITAL_COMMUNITY): Payer: Self-pay | Admitting: *Deleted

## 2013-02-20 DIAGNOSIS — E039 Hypothyroidism, unspecified: Secondary | ICD-10-CM | POA: Diagnosis present

## 2013-02-20 DIAGNOSIS — Y831 Surgical operation with implant of artificial internal device as the cause of abnormal reaction of the patient, or of later complication, without mention of misadventure at the time of the procedure: Secondary | ICD-10-CM | POA: Diagnosis present

## 2013-02-20 DIAGNOSIS — T84028A Dislocation of other internal joint prosthesis, initial encounter: Secondary | ICD-10-CM

## 2013-02-20 DIAGNOSIS — T84099A Other mechanical complication of unspecified internal joint prosthesis, initial encounter: Principal | ICD-10-CM | POA: Diagnosis present

## 2013-02-20 DIAGNOSIS — I2510 Atherosclerotic heart disease of native coronary artery without angina pectoris: Secondary | ICD-10-CM | POA: Diagnosis present

## 2013-02-20 DIAGNOSIS — Z6841 Body Mass Index (BMI) 40.0 and over, adult: Secondary | ICD-10-CM

## 2013-02-20 DIAGNOSIS — Z87891 Personal history of nicotine dependence: Secondary | ICD-10-CM

## 2013-02-20 DIAGNOSIS — I10 Essential (primary) hypertension: Secondary | ICD-10-CM | POA: Diagnosis present

## 2013-02-20 DIAGNOSIS — Z96651 Presence of right artificial knee joint: Secondary | ICD-10-CM

## 2013-02-20 DIAGNOSIS — D62 Acute posthemorrhagic anemia: Secondary | ICD-10-CM

## 2013-02-20 DIAGNOSIS — K219 Gastro-esophageal reflux disease without esophagitis: Secondary | ICD-10-CM | POA: Diagnosis present

## 2013-02-20 DIAGNOSIS — E785 Hyperlipidemia, unspecified: Secondary | ICD-10-CM | POA: Diagnosis present

## 2013-02-20 DIAGNOSIS — T84018A Broken internal joint prosthesis, other site, initial encounter: Secondary | ICD-10-CM

## 2013-02-20 DIAGNOSIS — Z96659 Presence of unspecified artificial knee joint: Secondary | ICD-10-CM

## 2013-02-20 DIAGNOSIS — E119 Type 2 diabetes mellitus without complications: Secondary | ICD-10-CM | POA: Diagnosis present

## 2013-02-20 DIAGNOSIS — G4730 Sleep apnea, unspecified: Secondary | ICD-10-CM | POA: Diagnosis present

## 2013-02-20 DIAGNOSIS — E669 Obesity, unspecified: Secondary | ICD-10-CM | POA: Diagnosis present

## 2013-02-20 DIAGNOSIS — K449 Diaphragmatic hernia without obstruction or gangrene: Secondary | ICD-10-CM | POA: Diagnosis present

## 2013-02-20 HISTORY — PX: TOTAL KNEE REVISION: SHX996

## 2013-02-20 LAB — GLUCOSE, CAPILLARY: Glucose-Capillary: 106 mg/dL — ABNORMAL HIGH (ref 70–99)

## 2013-02-20 LAB — TYPE AND SCREEN

## 2013-02-20 LAB — GRAM STAIN

## 2013-02-20 SURGERY — TOTAL KNEE REVISION
Anesthesia: General | Site: Knee | Laterality: Right | Wound class: Clean

## 2013-02-20 MED ORDER — BUPIVACAINE LIPOSOME 1.3 % IJ SUSP
20.0000 mL | Freq: Once | INTRAMUSCULAR | Status: DC
  Filled 2013-02-20: qty 20

## 2013-02-20 MED ORDER — ACETAMINOPHEN 500 MG PO TABS
1000.0000 mg | ORAL_TABLET | Freq: Four times a day (QID) | ORAL | Status: AC
  Administered 2013-02-20 – 2013-02-21 (×4): 1000 mg via ORAL
  Filled 2013-02-20 (×4): qty 2

## 2013-02-20 MED ORDER — ONDANSETRON HCL 4 MG/2ML IJ SOLN: 4.0000 mg | Freq: Four times a day (QID) | INTRAMUSCULAR | Status: DC | PRN

## 2013-02-20 MED ORDER — LACTATED RINGERS IV SOLN: INTRAVENOUS | Status: DC

## 2013-02-20 MED ORDER — METOCLOPRAMIDE HCL 10 MG PO TABS: 5.0000 mg | ORAL_TABLET | Freq: Three times a day (TID) | ORAL | Status: DC | PRN

## 2013-02-20 MED ORDER — SODIUM CHLORIDE 0.9 % IR SOLN
Status: DC | PRN
  Administered 2013-02-20: 3000 mL

## 2013-02-20 MED ORDER — OXYCODONE HCL 5 MG PO TABS
5.0000 mg | ORAL_TABLET | ORAL | Status: DC | PRN
  Administered 2013-02-20 (×2): 10 mg via ORAL
  Administered 2013-02-21: 15 mg via ORAL
  Administered 2013-02-21: 5 mg via ORAL
  Administered 2013-02-21 (×2): 10 mg via ORAL
  Administered 2013-02-21: 5 mg via ORAL
  Administered 2013-02-21 (×2): 10 mg via ORAL
  Administered 2013-02-21: 15 mg via ORAL
  Administered 2013-02-22 (×4): 10 mg via ORAL
  Filled 2013-02-20: qty 1
  Filled 2013-02-20 (×6): qty 2
  Filled 2013-02-20 (×2): qty 3
  Filled 2013-02-20 (×2): qty 2
  Filled 2013-02-20: qty 1
  Filled 2013-02-20 (×2): qty 2

## 2013-02-20 MED ORDER — STERILE WATER FOR IRRIGATION IR SOLN
Status: DC | PRN
  Administered 2013-02-20 (×2): 1500 mL

## 2013-02-20 MED ORDER — ESCITALOPRAM OXALATE 10 MG PO TABS
10.0000 mg | ORAL_TABLET | Freq: Every evening | ORAL | Status: DC
  Administered 2013-02-20 – 2013-02-21 (×2): 10 mg via ORAL
  Filled 2013-02-20 (×3): qty 1

## 2013-02-20 MED ORDER — MIDAZOLAM HCL 5 MG/5ML IJ SOLN
INTRAMUSCULAR | Status: DC | PRN
  Administered 2013-02-20: 2 mg via INTRAVENOUS

## 2013-02-20 MED ORDER — METOCLOPRAMIDE HCL 5 MG/ML IJ SOLN: 5.0000 mg | Freq: Three times a day (TID) | INTRAMUSCULAR | Status: DC | PRN

## 2013-02-20 MED ORDER — ACETAMINOPHEN 10 MG/ML IV SOLN
INTRAVENOUS | Status: DC | PRN
  Administered 2013-02-20: 1000 mg via INTRAVENOUS

## 2013-02-20 MED ORDER — ACETAMINOPHEN 10 MG/ML IV SOLN
1000.0000 mg | Freq: Once | INTRAVENOUS | Status: DC
  Filled 2013-02-20: qty 100

## 2013-02-20 MED ORDER — HYDROMORPHONE HCL PF 1 MG/ML IJ SOLN
0.5000 mg | INTRAMUSCULAR | Status: DC | PRN
  Administered 2013-02-20 (×2): 1 mg via INTRAVENOUS
  Filled 2013-02-20 (×2): qty 1

## 2013-02-20 MED ORDER — BUPIVACAINE LIPOSOME 1.3 % IJ SUSP
INTRAMUSCULAR | Status: DC | PRN
  Administered 2013-02-20: 20 mL

## 2013-02-20 MED ORDER — KETAMINE HCL 10 MG/ML IJ SOLN
INTRAMUSCULAR | Status: DC | PRN
  Administered 2013-02-20: 20 mg via INTRAVENOUS
  Administered 2013-02-20 (×3): 10 mg via INTRAVENOUS

## 2013-02-20 MED ORDER — FLEET ENEMA 7-19 GM/118ML RE ENEM: 1.0000 | ENEMA | Freq: Once | RECTAL | Status: AC | PRN

## 2013-02-20 MED ORDER — POTASSIUM CHLORIDE CRYS ER 20 MEQ PO TBCR
20.0000 meq | EXTENDED_RELEASE_TABLET | Freq: Every day | ORAL | Status: DC
  Administered 2013-02-22: 20 meq via ORAL
  Filled 2013-02-20 (×3): qty 1

## 2013-02-20 MED ORDER — SUFENTANIL CITRATE 50 MCG/ML IV SOLN
INTRAVENOUS | Status: DC | PRN
  Administered 2013-02-20: 10 ug via INTRAVENOUS
  Administered 2013-02-20: 20 ug via INTRAVENOUS
  Administered 2013-02-20 (×2): 10 ug via INTRAVENOUS

## 2013-02-20 MED ORDER — POLYETHYLENE GLYCOL 3350 17 G PO PACK
17.0000 g | PACK | Freq: Every day | ORAL | Status: DC | PRN
  Administered 2013-02-21: 17 g via ORAL

## 2013-02-20 MED ORDER — GABAPENTIN 100 MG PO CAPS
200.0000 mg | ORAL_CAPSULE | Freq: Two times a day (BID) | ORAL | Status: DC
  Administered 2013-02-20 – 2013-02-22 (×5): 200 mg via ORAL
  Filled 2013-02-20 (×6): qty 2

## 2013-02-20 MED ORDER — PHENOL 1.4 % MT LIQD
1.0000 | OROMUCOSAL | Status: DC | PRN
  Filled 2013-02-20: qty 177

## 2013-02-20 MED ORDER — DIPHENHYDRAMINE HCL 12.5 MG/5ML PO ELIX: 12.5000 mg | ORAL_SOLUTION | ORAL | Status: DC | PRN

## 2013-02-20 MED ORDER — DEXTROSE 5 % IV SOLN
3.0000 g | INTRAVENOUS | Status: AC
  Administered 2013-02-20: 3 g via INTRAVENOUS
  Filled 2013-02-20: qty 3000

## 2013-02-20 MED ORDER — MENTHOL 3 MG MT LOZG
1.0000 | LOZENGE | OROMUCOSAL | Status: DC | PRN
  Filled 2013-02-20: qty 9

## 2013-02-20 MED ORDER — BUPIVACAINE HCL 0.25 % IJ SOLN
INTRAMUSCULAR | Status: DC | PRN
  Administered 2013-02-20: 20 mL

## 2013-02-20 MED ORDER — METOPROLOL SUCCINATE ER 25 MG PO TB24
25.0000 mg | ORAL_TABLET | Freq: Every day | ORAL | Status: DC
  Administered 2013-02-21 – 2013-02-22 (×2): 25 mg via ORAL
  Filled 2013-02-20 (×3): qty 1

## 2013-02-20 MED ORDER — PANTOPRAZOLE SODIUM 40 MG PO TBEC
40.0000 mg | DELAYED_RELEASE_TABLET | Freq: Every day | ORAL | Status: DC
  Administered 2013-02-20 – 2013-02-21 (×2): 40 mg via ORAL
  Filled 2013-02-20 (×3): qty 1

## 2013-02-20 MED ORDER — LACTATED RINGERS IV SOLN
INTRAVENOUS | Status: DC | PRN
  Administered 2013-02-20 (×3): via INTRAVENOUS

## 2013-02-20 MED ORDER — ONDANSETRON HCL 4 MG PO TABS: 4.0000 mg | ORAL_TABLET | Freq: Four times a day (QID) | ORAL | Status: DC | PRN

## 2013-02-20 MED ORDER — PROPOFOL 10 MG/ML IV BOLUS
INTRAVENOUS | Status: DC | PRN
  Administered 2013-02-20: 150 mg via INTRAVENOUS

## 2013-02-20 MED ORDER — SODIUM CHLORIDE 0.9 % IV SOLN: INTRAVENOUS | Status: DC

## 2013-02-20 MED ORDER — ONDANSETRON HCL 4 MG/2ML IJ SOLN
INTRAMUSCULAR | Status: DC | PRN
  Administered 2013-02-20: 4 mg via INTRAVENOUS

## 2013-02-20 MED ORDER — LIDOCAINE HCL (CARDIAC) 20 MG/ML IV SOLN
INTRAVENOUS | Status: DC | PRN
  Administered 2013-02-20: 100 mg via INTRAVENOUS

## 2013-02-20 MED ORDER — DEXAMETHASONE SODIUM PHOSPHATE 10 MG/ML IJ SOLN
10.0000 mg | Freq: Once | INTRAMUSCULAR | Status: AC
  Administered 2013-02-20: 10 mg via INTRAVENOUS

## 2013-02-20 MED ORDER — SIMVASTATIN 40 MG PO TABS
40.0000 mg | ORAL_TABLET | ORAL | Status: DC
  Administered 2013-02-20 – 2013-02-22 (×3): 40 mg via ORAL
  Filled 2013-02-20 (×4): qty 1

## 2013-02-20 MED ORDER — BISACODYL 10 MG RE SUPP: 10.0000 mg | Freq: Every day | RECTAL | Status: DC | PRN

## 2013-02-20 MED ORDER — RIVAROXABAN 10 MG PO TABS
10.0000 mg | ORAL_TABLET | Freq: Every day | ORAL | Status: DC
  Administered 2013-02-21 – 2013-02-22 (×2): 10 mg via ORAL
  Filled 2013-02-20 (×3): qty 1

## 2013-02-20 MED ORDER — CEFAZOLIN SODIUM-DEXTROSE 2-3 GM-% IV SOLR
2.0000 g | Freq: Four times a day (QID) | INTRAVENOUS | Status: AC
  Administered 2013-02-20 (×2): 2 g via INTRAVENOUS
  Filled 2013-02-20 (×2): qty 50

## 2013-02-20 MED ORDER — FUROSEMIDE 20 MG PO TABS
20.0000 mg | ORAL_TABLET | Freq: Every day | ORAL | Status: DC
  Administered 2013-02-20 – 2013-02-22 (×2): 20 mg via ORAL
  Filled 2013-02-20 (×4): qty 1

## 2013-02-20 MED ORDER — INSULIN ASPART 100 UNIT/ML ~~LOC~~ SOLN
0.0000 [IU] | Freq: Three times a day (TID) | SUBCUTANEOUS | Status: DC
  Administered 2013-02-20: 3 [IU] via SUBCUTANEOUS
  Administered 2013-02-21 – 2013-02-22 (×3): 2 [IU] via SUBCUTANEOUS

## 2013-02-20 MED ORDER — DEXAMETHASONE 6 MG PO TABS
10.0000 mg | ORAL_TABLET | Freq: Every day | ORAL | Status: AC
  Administered 2013-02-21: 10 mg via ORAL
  Filled 2013-02-20: qty 1

## 2013-02-20 MED ORDER — SODIUM CHLORIDE 0.9 % IV SOLN
INTRAVENOUS | Status: DC
  Administered 2013-02-20: 21:00:00 via INTRAVENOUS

## 2013-02-20 MED ORDER — LEVOTHYROXINE SODIUM 112 MCG PO TABS
112.0000 ug | ORAL_TABLET | Freq: Every day | ORAL | Status: DC
  Administered 2013-02-21 – 2013-02-22 (×2): 112 ug via ORAL
  Filled 2013-02-20 (×3): qty 1

## 2013-02-20 MED ORDER — 0.9 % SODIUM CHLORIDE (POUR BTL) OPTIME
TOPICAL | Status: DC | PRN
  Administered 2013-02-20: 1000 mL

## 2013-02-20 MED ORDER — HYDROMORPHONE HCL PF 1 MG/ML IJ SOLN
0.2500 mg | INTRAMUSCULAR | Status: DC | PRN
  Administered 2013-02-20 (×4): 0.5 mg via INTRAVENOUS

## 2013-02-20 MED ORDER — DOCUSATE SODIUM 100 MG PO CAPS
100.0000 mg | ORAL_CAPSULE | Freq: Two times a day (BID) | ORAL | Status: DC
  Administered 2013-02-20 – 2013-02-22 (×5): 100 mg via ORAL

## 2013-02-20 MED ORDER — DEXAMETHASONE SODIUM PHOSPHATE 10 MG/ML IJ SOLN
10.0000 mg | Freq: Every day | INTRAMUSCULAR | Status: AC
  Filled 2013-02-20: qty 1

## 2013-02-20 MED ORDER — METFORMIN HCL 500 MG PO TABS
500.0000 mg | ORAL_TABLET | Freq: Every day | ORAL | Status: DC
  Administered 2013-02-21: 500 mg via ORAL
  Filled 2013-02-20 (×2): qty 1

## 2013-02-20 MED ORDER — METHOCARBAMOL 500 MG PO TABS
500.0000 mg | ORAL_TABLET | Freq: Four times a day (QID) | ORAL | Status: DC | PRN
  Administered 2013-02-20 – 2013-02-22 (×6): 500 mg via ORAL
  Filled 2013-02-20 (×6): qty 1

## 2013-02-20 MED ORDER — KETOROLAC TROMETHAMINE 15 MG/ML IJ SOLN
7.5000 mg | Freq: Four times a day (QID) | INTRAMUSCULAR | Status: AC | PRN
  Administered 2013-02-20 – 2013-02-21 (×2): 7.5 mg via INTRAVENOUS
  Filled 2013-02-20: qty 1

## 2013-02-20 MED ORDER — TRAMADOL HCL 50 MG PO TABS
50.0000 mg | ORAL_TABLET | Freq: Four times a day (QID) | ORAL | Status: DC | PRN
  Administered 2013-02-21: 100 mg via ORAL
  Filled 2013-02-20: qty 2

## 2013-02-20 MED ORDER — EPHEDRINE SULFATE 50 MG/ML IJ SOLN
INTRAMUSCULAR | Status: DC | PRN
  Administered 2013-02-20 (×2): 10 mg via INTRAVENOUS

## 2013-02-20 MED ORDER — SODIUM CHLORIDE 0.9 % IJ SOLN
INTRAMUSCULAR | Status: DC | PRN
  Administered 2013-02-20: 30 mL

## 2013-02-20 MED ORDER — CHLORHEXIDINE GLUCONATE 4 % EX LIQD
60.0000 mL | Freq: Once | CUTANEOUS | Status: DC
  Filled 2013-02-20: qty 60

## 2013-02-20 MED ORDER — ACETAMINOPHEN 650 MG RE SUPP: 650.0000 mg | Freq: Four times a day (QID) | RECTAL | Status: AC

## 2013-02-20 MED ORDER — CISATRACURIUM BESYLATE (PF) 10 MG/5ML IV SOLN
INTRAVENOUS | Status: DC | PRN
  Administered 2013-02-20: 8 mg via INTRAVENOUS

## 2013-02-20 MED ORDER — SUCCINYLCHOLINE CHLORIDE 20 MG/ML IJ SOLN
INTRAMUSCULAR | Status: DC | PRN
  Administered 2013-02-20: 100 mg via INTRAVENOUS

## 2013-02-20 MED ORDER — DEXTROSE 5 % IV SOLN
500.0000 mg | Freq: Four times a day (QID) | INTRAVENOUS | Status: DC | PRN
  Administered 2013-02-20: 500 mg via INTRAVENOUS
  Filled 2013-02-20: qty 5

## 2013-02-20 MED ORDER — HYDROMORPHONE HCL PF 1 MG/ML IJ SOLN
INTRAMUSCULAR | Status: DC | PRN
  Administered 2013-02-20 (×2): .4 mg via INTRAVENOUS
  Administered 2013-02-20 (×2): .6 mg via INTRAVENOUS

## 2013-02-20 MED ORDER — PROMETHAZINE HCL 25 MG/ML IJ SOLN: 6.2500 mg | INTRAMUSCULAR | Status: DC | PRN

## 2013-02-20 MED ORDER — FENTANYL CITRATE 0.05 MG/ML IJ SOLN
25.0000 ug | INTRAMUSCULAR | Status: DC | PRN
  Administered 2013-02-20 (×3): 25 ug via INTRAVENOUS

## 2013-02-20 SURGICAL SUPPLY — 76 items
ADAPTER BOLT FEMORAL +2/-2 (Knees) ×1 IMPLANT
ADPR FEM +2/-2 OFST BOLT (Knees) ×1 IMPLANT
ADPR FEM 5D STRL KN PFC SGM (Orthopedic Implant) ×1 IMPLANT
AUG FEM DIST PFC 4 8 RT (Knees) ×1 IMPLANT
AUG FEM SZ4 8 CMB POST STRL LF (Orthopedic Implant) ×2 IMPLANT
AUG FEM SZ4 8 STRL LF KN RT TI (Knees) ×1 IMPLANT
AUGMENT FEM DIST PFC 4 8 RT (Knees) IMPLANT
AUGMENT FMRL PST PFC SIG SZ4 8 (Orthopedic Implant) IMPLANT
BAG SPEC THK2 15X12 ZIP CLS (MISCELLANEOUS) ×1
BAG ZIPLOCK 12X15 (MISCELLANEOUS) ×2 IMPLANT
BANDAGE ELASTIC 6 VELCRO ST LF (GAUZE/BANDAGES/DRESSINGS) ×2 IMPLANT
BANDAGE ESMARK 6X9 LF (GAUZE/BANDAGES/DRESSINGS) ×1 IMPLANT
BLADE SAG 18X100X1.27 (BLADE) ×2 IMPLANT
BLADE SAW SGTL 11.0X1.19X90.0M (BLADE) ×2 IMPLANT
BNDG CMPR 9X6 STRL LF SNTH (GAUZE/BANDAGES/DRESSINGS) ×1
BNDG ESMARK 6X9 LF (GAUZE/BANDAGES/DRESSINGS) ×2
BONE CEMENT GENTAMICIN (Cement) ×6 IMPLANT
CEMENT BONE GENTAMICIN 40 (Cement) IMPLANT
CEMENT RESTRICTOR DEPUY SZ 3 (Cement) ×1 IMPLANT
CLOTH BEACON ORANGE TIMEOUT ST (SAFETY) ×2 IMPLANT
COMP FEM CEM RT SZ4 (Orthopedic Implant) ×2 IMPLANT
COMPONENT FEM CEM RT SZ4 (Orthopedic Implant) IMPLANT
CONT SPECI 4OZ STER CLIK (MISCELLANEOUS) ×2 IMPLANT
CUFF TOURN SGL QUICK 34 (TOURNIQUET CUFF) ×2
CUFF TRNQT CYL 34X4X40X1 (TOURNIQUET CUFF) ×1 IMPLANT
DISTAL AUGMENT (Knees) ×1 IMPLANT
DISTAL RT FEMUR PFC SIGMA 12MM ×1 IMPLANT
DRAPE EXTREMITY T 121X128X90 (DRAPE) ×2 IMPLANT
DRAPE POUCH INSTRU U-SHP 10X18 (DRAPES) ×2 IMPLANT
DRAPE U-SHAPE 47X51 STRL (DRAPES) ×2 IMPLANT
DRSG ADAPTIC 3X8 NADH LF (GAUZE/BANDAGES/DRESSINGS) ×2 IMPLANT
DRSG EMULSION OIL 3X16 NADH (GAUZE/BANDAGES/DRESSINGS) ×1 IMPLANT
DRSG PAD ABDOMINAL 8X10 ST (GAUZE/BANDAGES/DRESSINGS) ×2 IMPLANT
DURAPREP 26ML APPLICATOR (WOUND CARE) ×2 IMPLANT
ELECT REM PT RETURN 9FT ADLT (ELECTROSURGICAL) ×2
ELECTRODE REM PT RTRN 9FT ADLT (ELECTROSURGICAL) ×1 IMPLANT
EVACUATOR 1/8 PVC DRAIN (DRAIN) ×2 IMPLANT
FACESHIELD LNG OPTICON STERILE (SAFETY) ×10 IMPLANT
FEM POST AUG PFC SIGMA SZ4 8MM (Orthopedic Implant) ×4 IMPLANT
FEMORAL ADAPTER (Orthopedic Implant) ×1 IMPLANT
GLOVE BIO SURGEON STRL SZ7.5 (GLOVE) ×2 IMPLANT
GLOVE BIO SURGEON STRL SZ8 (GLOVE) ×2 IMPLANT
GLOVE BIOGEL PI IND STRL 8 (GLOVE) ×2 IMPLANT
GLOVE BIOGEL PI INDICATOR 8 (GLOVE) ×2
GLOVE SURG SS PI 6.5 STRL IVOR (GLOVE) ×4 IMPLANT
GOWN STRL NON-REIN LRG LVL3 (GOWN DISPOSABLE) ×4 IMPLANT
GOWN STRL REIN XL XLG (GOWN DISPOSABLE) ×2 IMPLANT
HANDPIECE INTERPULSE COAX TIP (DISPOSABLE) ×2
IMMOBILIZER KNEE 20 (SOFTGOODS) ×2
IMMOBILIZER KNEE 20 THIGH 36 (SOFTGOODS) ×1 IMPLANT
INSERT TIBAIL RP SZ4 22.5 KNEE (Insert) ×1 IMPLANT
KIT BASIN OR (CUSTOM PROCEDURE TRAY) ×2 IMPLANT
MANIFOLD NEPTUNE II (INSTRUMENTS) ×2 IMPLANT
NS IRRIG 1000ML POUR BTL (IV SOLUTION) ×2 IMPLANT
PACK TOTAL JOINT (CUSTOM PROCEDURE TRAY) ×2 IMPLANT
PAD ABD 7.5X8 STRL (GAUZE/BANDAGES/DRESSINGS) ×2 IMPLANT
PADDING CAST COTTON 6X4 STRL (CAST SUPPLIES) ×5 IMPLANT
POSITIONER SURGICAL ARM (MISCELLANEOUS) ×2 IMPLANT
SET HNDPC FAN SPRY TIP SCT (DISPOSABLE) ×1 IMPLANT
SLEEVE UNIV FEM DIST PRO SZ 31 (Sleeve) ×1 IMPLANT
SPONGE GAUZE 4X4 12PLY (GAUZE/BANDAGES/DRESSINGS) ×2 IMPLANT
STAPLER VISISTAT 35W (STAPLE) ×2 IMPLANT
STEM TIBIA PFC 13X30MM (Stem) ×1 IMPLANT
STEM UNIVERSAL REVISION 75X16 (Stem) ×1 IMPLANT
SUCTION FRAZIER 12FR DISP (SUCTIONS) ×2 IMPLANT
SUT VIC AB 2-0 CT1 27 (SUTURE) ×8
SUT VIC AB 2-0 CT1 TAPERPNT 27 (SUTURE) ×3 IMPLANT
SWAB COLLECTION DEVICE MRSA (MISCELLANEOUS) ×2 IMPLANT
TOWEL OR 17X26 10 PK STRL BLUE (TOWEL DISPOSABLE) ×4 IMPLANT
TOWER CARTRIDGE SMART MIX (DISPOSABLE) ×2 IMPLANT
TRAY FOLEY CATH 14FRSI W/METER (CATHETERS) ×2 IMPLANT
TRAY SLEEVE CEM ML (Knees) ×1 IMPLANT
TRAY TIBIAL MBT REVISION (Knees) ×1 IMPLANT
TUBE ANAEROBIC SPECIMEN COL (MISCELLANEOUS) ×2 IMPLANT
WATER STERILE IRR 1500ML POUR (IV SOLUTION) ×2 IMPLANT
WRAP KNEE MAXI GEL POST OP (GAUZE/BANDAGES/DRESSINGS) ×4 IMPLANT

## 2013-02-20 NOTE — Anesthesia Procedure Notes (Signed)
Procedure Name: Intubation Date/Time: 02/20/2013 7:23 AM Performed by: Leroy Libman L Patient Re-evaluated:Patient Re-evaluated prior to inductionOxygen Delivery Method: Circle system utilized Preoxygenation: Pre-oxygenation with 100% oxygen Intubation Type: IV induction Ventilation: Mask ventilation without difficulty and Oral airway inserted - appropriate to patient size Laryngoscope Size: Miller and 3 Grade View: Grade I Tube type: Oral Tube size: 8.0 mm Number of attempts: 1 Airway Equipment and Method: Stylet Secured at: 20 cm Tube secured with: Tape Dental Injury: Teeth and Oropharynx as per pre-operative assessment

## 2013-02-20 NOTE — Progress Notes (Signed)
Patient states she finished taking Cipro for UTI 

## 2013-02-20 NOTE — Progress Notes (Signed)
Utilization review completed.  

## 2013-02-20 NOTE — Interval H&P Note (Signed)
History and Physical Interval Note:  02/20/2013 6:53 AM  Andrea Norton  has presented today for surgery, with the diagnosis of FAILED RIGHT TOTAL KNEE REPLACEMENT  The various methods of treatment have been discussed with the patient and family. After consideration of risks, benefits and other options for treatment, the patient has consented to  Procedure(s): RIGHT TOTAL KNEE ARTHROPLASTY REVISION VERSUS RESECTION ARTHROPLASTY (Right) as a surgical intervention .  The patient's history has been reviewed, patient examined, no change in status, stable for surgery.  I have reviewed the patient's chart and labs.  Questions were answered to the patient's satisfaction.     Loanne Drilling

## 2013-02-20 NOTE — Anesthesia Preprocedure Evaluation (Addendum)
Anesthesia Evaluation  Patient identified by MRN, date of birth, ID band Patient awake    Reviewed: Allergy & Precautions, H&P , NPO status , Patient's Chart, lab work & pertinent test results  Airway Mallampati: II TM Distance: >3 FB Neck ROM: Full    Dental no notable dental hx.    Pulmonary shortness of breath, sleep apnea ,  breath sounds clear to auscultation  Pulmonary exam normal       Cardiovascular hypertension, Pt. on medications and Pt. on home beta blockers + CAD negative cardio ROS  Rhythm:Regular Rate:Normal     Neuro/Psych PSYCHIATRIC DISORDERS Depression negative neurological ROS     GI/Hepatic Neg liver ROS, GERD-  ,  Endo/Other  diabetes, Type 2, Oral Hypoglycemic AgentsHypothyroidism   Renal/GU negative Renal ROS  negative genitourinary   Musculoskeletal negative musculoskeletal ROS (+)   Abdominal (+) + obese,   Peds negative pediatric ROS (+)  Hematology negative hematology ROS (+)   Anesthesia Other Findings   Reproductive/Obstetrics negative OB ROS                           Anesthesia Physical Anesthesia Plan  ASA: III  Anesthesia Plan: General   Post-op Pain Management:    Induction: Intravenous  Airway Management Planned: Oral ETT  Additional Equipment:   Intra-op Plan:   Post-operative Plan: Extubation in OR  Informed Consent: I have reviewed the patients History and Physical, chart, labs and discussed the procedure including the risks, benefits and alternatives for the proposed anesthesia with the patient or authorized representative who has indicated his/her understanding and acceptance.   Dental advisory given  Plan Discussed with: CRNA  Anesthesia Plan Comments: (Discussed general versus spinal. Patient prefers general.)       Anesthesia Quick Evaluation

## 2013-02-20 NOTE — Transfer of Care (Signed)
Immediate Anesthesia Transfer of Care Note  Patient: Andrea Norton  Procedure(s) Performed: Procedure(s): RIGHT TOTAL KNEE ARTHROPLASTY REVISION VERSUS RESECTION ARTHROPLASTY (Right)  Patient Location: PACU  Anesthesia Type:General  Level of Consciousness: awake, alert  and oriented  Airway & Oxygen Therapy: Patient Spontanous Breathing and Patient connected to face mask oxygen  Post-op Assessment: Report given to PACU RN and Post -op Vital signs reviewed and stable  Post vital signs: Reviewed and stable  Complications: No apparent anesthesia complications

## 2013-02-20 NOTE — Brief Op Note (Signed)
02/20/2013  9:36 AM  PATIENT:  Andrea Norton  67 y.o. female  PRE-OPERATIVE DIAGNOSIS:  FAILED RIGHT TOTAL KNEE REPLACEMENT  POST-OPERATIVE DIAGNOSIS:  FAILED RIGHT TOTAL KNEE REPLACEMENT  PROCEDURE:  Right Total Knee Arthroplasty Revision  SURGEON:  Surgeon(s) and Role:    Loanne Drilling, MD - Primary  PHYSICIAN ASSISTANT:   ASSISTANTS: [name], PA-C   ANESTHESIA:   general  EBL:  Total I/O In: 1000 [I.V.:1000] Out: 350 [Urine:200; Blood:150]  BLOOD ADMINISTERED:none  DRAINS: (Medium) Hemovact drain(s) in the right knee with  Suction Open   LOCAL MEDICATIONS USED:  OTHER Exparel  SPECIMEN:  No Specimen  DISPOSITION OF SPECIMEN:  N/A  COUNTS:  YES  TOURNIQUET:  Up 62 minutes @ 300 mm Hg; down 8 minutes; up additional 25 minutes @ 300 mm Hg  DICTATION: .Other Dictation: Dictation Number [id number]  PLAN OF CARE: Admit to inpatient   PATIENT DISPOSITION:  PACU - hemodynamically stable.

## 2013-02-20 NOTE — H&P (View-Only) (Signed)
Andrea Norton  DOB: 10/21/1945 Married / Language: English / Race: White Female  Date of Admission:  02/20/2013  Chief Complaint:  Failed right total knee with pain  History of Present Illness(Alexzandrew L Perkins, III PA-C; 02/05/2013 10:40 AM) The patient is a 67 year old female who comes in for a preoperative History and Physical. The patient is scheduled for a right revision right total knee versus knee resection and placement of antibotic spacer to be performed by Dr. Frank V. Aluisio, MD at [facility] on 02/20/2013. The patient is a 67 year old female presenting for a post-operative visit. The patient comes in today 1 year out from right total knee arthroplasty (revision). The patient states that she is getting worse at this time. The pain is under poor control at this time and describe their pain as severe and she has been taking Aleve for pain (she states pain meds don't help at all). The patient feels that they are progressing poorly at this time. Note for "Post TKA": She had a bone scan done 10/03/12. She is scheduled to have a revision of her total knee. It is going to be revision versus resection arthroplasty. She has a remote history of infection but has not had any signs or symptoms of infection for several years. Blood work had been unremarkable. She states she has had increased pain in the lower leg recently. It is mainly pretibial pain. On her bone scan the tibial component was what was showing more activity. She is ready to proceed with surgery. They have been treated conservatively in the past for the above stated problem and despite conservative measures, they continue to have progressive pain and severe functional limitations and dysfunction. They have failed non-operative management including home exercise, medications. It is felt that they would benefit from undergoing total joint replacement. Risks and benefits of the procedure have been discussed with the  patient and they elect to proceed with surgery. There are no active contraindications to surgery such as ongoing infection or rapidly progressive neurological disease.   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 03/18/2010   Allergies Morphine Sulfate (Concentrate) *ANALGESICS - OPIOID*. Did not help with her pain   Family History Cancer. father Chronic Obstructive Lung Disease. mother Cerebrovascular Accident. brother Heart Disease. brother Osteoarthritis. mother Hypertension. mother and sister Osteoporosis. mother and sister   Social History Tobacco use. Former smoker. former smoker; smoke(d) 1 pack(s) per day Tobacco / smoke exposure. yes Number of flights of stairs before winded. greater than 5 Drug/Alcohol Rehab (Previously). no Drug/Alcohol Rehab (Currently). no Illicit drug use. no Marital status. married Living situation. live with spouse Alcohol use. Never consumed alcohol. former drinker Current work status. disabled Children. 1 Post-Surgical Plans. Home   Medication History Metoprolol Succinate (50MG Tablet ER 24HR, Oral) Active. Potassium Chloride (20MEQ Tablet ER, Oral) Active. Simvastatin (40MG Tablet, Oral) Active. Synthroid (125MCG Tablet, Oral) Active. Furosemide (20MG Tablet, Oral) Active. Pantoprazole Sodium (40MG Tablet DR, Oral) Active. Vitamin D (Ergocalciferol) (50000UNIT Capsule, Oral) Active. Gabapentin (100MG Capsule, Oral) Active. Cyanocobalamin (100MCG/ML Solution, Injection monthly) Active. Escitalopram Oxalate (10MG Tablet, Oral) Active. Aspirin EC (81MG Tablet DR, Oral) Active. Metoprolol Tartrate (25MG Tablet, Oral) Active. MetFORMIN HCl ER (500MG Tablet ER 24HR, Oral) Active. Protonix ( Oral) Specific dose unknown - Active. B12 Liquid Health  Booster (1000MCG/15ML Liquid, Oral monthly) Active.  Past Surgical History   Colectomy. Date: 2005. partial Total Knee Replacement. bilateral Foot Surgery. left Arthroscopic Knee Surgery - Right Knee Replacement, Total. right X2 as well as left History of cardiac catheterization (V45.89). Angioplasty with stenting (1 stent) Arthroscopy of Knee. bilateral Appendectomy   Medical History Menopause Diabetes Mellitus, Type II Coronary Artery Disease/Heart Disease Hiatal Hernia Urinary Tract Infection. History of Hypothyroidism Diverticulosis Gastroesophageal Reflux Disease Hemorrhoids. Recurrent bleeding Sleep Apnea Hypercholesterolemia High blood pressure Depression   Review of Systems General:Present- Night Sweats and Fatigue. Not Present- Chills, Fever, Appetite Loss, Feeling sick, Weight Gain and Weight Loss. Skin:Present- Itching. Not Present- Rash, Skin Color Changes, Ulcer, Psoriasis and Change in Hair or Nails. HEENT:Not Present- Sensitivity to light, Hearing problems, Nose Bleed and Ringing in the Ears. Neck:Not Present- Swollen Glands and Neck Mass. Respiratory:Not Present- Snoring, Chronic Cough, Bloody sputum and Dyspnea. Cardiovascular:Present- Swelling of Extremities and Leg Cramps. Not Present- Shortness of Breath, Chest Pain and Palpitations. Gastrointestinal:Present- Abdominal Pain and Nausea. Not Present- Bloody Stool, Heartburn, Vomiting and Incontinence of Stool. Female Genitourinary:Present- Frequency. Not Present- Blood in Urine, Menstrual Irregularities, Incontinence and Nocturia. Musculoskeletal:Present- Joint Stiffness, Joint Pain and Back Pain. Not Present- Muscle Weakness, Muscle Pain and Joint Swelling. Neurological:Present- Tingling, Numbness and Tremor. Not Present- Burning, Headaches and Dizziness. Psychiatric:Present- Depression. Not Present- Anxiety and Memory Loss. Endocrine:Present- Heat Intolerance and Excessive Thirst. Not  Present- Cold Intolerance and Excessive hunger. Hematology:Present- Anemia. Not Present- Abnormal Bleeding, Blood Clots and Easy Bruising.   Vitals Weight: 280 lb Height: 63 in Body Surface Area: 2.38 m Body Mass Index: 49.6 kg/m Pulse: 88 (Regular) Resp.: 14 (Unlabored) BP: 132/78 (Sitting, Right Arm, Standard)   Physical Exam The physical exam findings are as follows:  Note: Patient is a 67 year old female with continued instability of the right knee. Patient is accompanied today by her husband Ralph.   General Mental Status - Alert, cooperative and good historian. General Appearance- pleasant. Not in acute distress. Orientation- Oriented X3. Build & Nutrition- Well nourished and Well developed.   Head and Neck Head- normocephalic, atraumatic . Neck Global Assessment- supple. no bruit auscultated on the right and no bruit auscultated on the left.   Eye Pupil- Bilateral- Regular and Round. Motion- Bilateral- EOMI. wears contacts  Chest and Lung Exam Auscultation: Breath sounds:- clear at anterior chest wall and - clear at posterior chest wall. Adventitious sounds:- No Adventitious sounds.   Cardiovascular Auscultation:Rhythm- Regular rate and rhythm. Heart Sounds- S1 WNL and S2 WNL. Murmurs & Other Heart Sounds:Auscultation of the heart reveals - No Murmurs.   Abdomen Palpation/Percussion:Tenderness- Abdomen is non-tender to palpation. Rigidity (guarding)- Abdomen is soft. Auscultation:Auscultation of the abdomen reveals - Bowel sounds normal.   Female Genitourinary Not done, not pertinent to present illness  Musculoskeletal On exam she is alert and oriented in no apparent distress. On exam she is in no distress. Her right knee shows no effusion. She is tender over the proximal tibia. Her range of motion is about 0 to 120. She does not have any gross instability. There is very mild AP laxity.   RADIOGRAPHS: AP  and lateral of the knee show revision prosthesis with no evidence of any periprosthetic fractures. She does appear to have some tibial loosening but no migration of the tibial component.  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 Aluisio.  Plan is to go home.  The patient   will not receive TXA (tranexamic acid) due to: Coronary Arterial Disease  PCP - Dr. Leonard Nyland - Patient has been seen preoperatively and felt to be stable for surgery. She was given a verbal okay by their office.  Signed electronically by Alexzandrew L Perkins, III PA-C  

## 2013-02-20 NOTE — Preoperative (Signed)
Beta Blockers   Reason not to administer Beta Blockers:Not Applicable 

## 2013-02-20 NOTE — Anesthesia Postprocedure Evaluation (Signed)
  Anesthesia Post-op Note  Patient: Andrea Norton  Procedure(s) Performed: Procedure(s) (LRB): RIGHT TOTAL KNEE ARTHROPLASTY REVISION VERSUS RESECTION ARTHROPLASTY (Right)  Patient Location: PACU  Anesthesia Type: General  Level of Consciousness: awake and alert   Airway and Oxygen Therapy: Patient Spontanous Breathing  Post-op Pain: mild  Post-op Assessment: Post-op Vital signs reviewed, Patient's Cardiovascular Status Stable, Respiratory Function Stable, Patent Airway and No signs of Nausea or vomiting  Last Vitals:  Filed Vitals:   02/20/13 1129  BP: 105/55  Pulse: 83  Temp: 36.7 C  Resp: 16    Post-op Vital Signs: stable   Complications: No apparent anesthesia complications

## 2013-02-20 NOTE — Evaluation (Signed)
Physical Therapy Evaluation Patient Details Name: Andrea Norton MRN: [id number] DOB: 1945-09-15 Today's Date: 02/20/2013 Time: 2130-8657 PT Time Calculation (min): 32 min  PT Assessment / Plan / Recommendation History of Present Illness     Clinical Impression  Pt s/p R TKR revision presents with decreased R LE strength/ROM and post op pain limiting functional mobility    PT Assessment  Patient needs continued PT services    Follow Up Recommendations  Home health PT    Does the patient have the potential to tolerate intense rehabilitation      Barriers to Discharge        Equipment Recommendations  None recommended by PT    Recommendations for Other Services OT consult   Frequency 7X/week    Precautions / Restrictions Precautions Precautions: Knee;Fall Required Braces or Orthoses: Knee Immobilizer - Right Knee Immobilizer - Right: Discontinue once straight leg raise with < 10 degree lag Restrictions Weight Bearing Restrictions: No Other Position/Activity Restrictions: WBAT   Pertinent Vitals/Pain 4/10; RN aware      Mobility  Bed Mobility Bed Mobility: Supine to Sit Supine to Sit: 4: Min assist Details for Bed Mobility Assistance: min cues for sequencing` Transfers Transfers: Sit to Stand;Stand to Sit Sit to Stand: 4: Min assist;3: Mod assist Stand to Sit: 4: Min assist Details for Transfer Assistance: cues for LE management and use of UEs to self assist Ambulation/Gait Ambulation/Gait Assistance: 4: Min assist Ambulation Distance (Feet): 28 Feet Assistive device: Rolling walker Ambulation/Gait Assistance Details: min cues for posture, sequence and position from RW Gait Pattern: Step-to pattern;Antalgic    Exercises Total Joint Exercises Ankle Circles/Pumps: AROM;Both;15 reps;Supine Quad Sets: AROM;Both;10 reps;Supine Heel Slides: AAROM;10 reps;Supine;Right Straight Leg Raises: AAROM;Right;10 reps;Supine   PT Diagnosis: Difficulty walking  PT Problem  List: Decreased strength;Decreased range of motion;Decreased activity tolerance;Decreased mobility;Decreased knowledge of use of DME;Obesity;Pain PT Treatment Interventions: DME instruction;Gait training;Stair training;Functional mobility training;Therapeutic activities;Therapeutic exercise;Patient/family education     PT Goals(Current goals can be found in the care plan section) Acute Rehab PT Goals Patient Stated Goal: Walk without pain PT Goal Formulation: With patient Time For Goal Achievement: 02/27/13 Potential to Achieve Goals: Good  Visit Information  Last PT Received On: 02/20/13 Assistance Needed: +1       Prior Functioning  Home Living Family/patient expects to be discharged to:: Private residence Living Arrangements: Spouse/significant other Available Help at Discharge: Family Type of Home: House Home Access: Stairs to enter Secretary/administrator of Steps: 4 Entrance Stairs-Rails: Right;Left Home Layout: One level Home Equipment: Environmental consultant - 2 wheels Prior Function Level of Independence: Independent Communication Communication: No difficulties Dominant Hand: Right    Cognition  Cognition Arousal/Alertness: Awake/alert Behavior During Therapy: WFL for tasks assessed/performed Overall Cognitive Status: Within Functional Limits for tasks assessed    Extremity/Trunk Assessment Upper Extremity Assessment Upper Extremity Assessment: Overall WFL for tasks assessed Lower Extremity Assessment Lower Extremity Assessment: RLE deficits/detail RLE Deficits / Details: Quads 2/5 with AAROM at knee -10 - 60 Cervical / Trunk Assessment Cervical / Trunk Assessment: Normal   Balance    End of Session PT - End of Session Equipment Utilized During Treatment: Right knee immobilizer Activity Tolerance: Patient tolerated treatment well Patient left: in chair;with call bell/phone within reach;with family/visitor present Nurse Communication: Mobility status CPM Right Knee CPM  Right Knee: On  GP     Andrea Norton 02/20/2013, 4:38 PM

## 2013-02-21 DIAGNOSIS — D62 Acute posthemorrhagic anemia: Secondary | ICD-10-CM

## 2013-02-21 LAB — BASIC METABOLIC PANEL
BUN: 16 mg/dL (ref 6–23)
CO2: 26 mEq/L (ref 19–32)
Calcium: 8.5 mg/dL (ref 8.4–10.5)
Creatinine, Ser: 0.72 mg/dL (ref 0.50–1.10)
Glucose, Bld: 149 mg/dL — ABNORMAL HIGH (ref 70–99)

## 2013-02-21 LAB — GLUCOSE, CAPILLARY
Glucose-Capillary: 140 mg/dL — ABNORMAL HIGH (ref 70–99)
Glucose-Capillary: 125 mg/dL — ABNORMAL HIGH (ref 70–99)

## 2013-02-21 LAB — CBC
Hemoglobin: 10.2 g/dL — ABNORMAL LOW (ref 12.0–15.0)
MCH: 27.1 pg (ref 26.0–34.0)
MCV: 84.6 fL (ref 78.0–100.0)
Platelets: 221 10*3/uL (ref 150–400)
RBC: 3.76 MIL/uL — ABNORMAL LOW (ref 3.87–5.11)

## 2013-02-21 MED ORDER — SODIUM CHLORIDE 0.9 % IV BOLUS (SEPSIS)
500.0000 mL | Freq: Once | INTRAVENOUS | Status: AC
  Administered 2013-02-21: 500 mL via INTRAVENOUS

## 2013-02-21 NOTE — Progress Notes (Signed)
Physical Therapy Treatment Patient Details Name: Andrea Norton MRN: [id number] DOB: [birth year] Today's Date: 02/21/2013 Time: 4098-1191 PT Time Calculation (min): 20 min  PT Assessment / Plan / Recommendation  History of Present Illness     PT Comments   OOB deferred 2* decreased BP  Follow Up Recommendations  Home health PT     Does the patient have the potential to tolerate intense rehabilitation     Barriers to Discharge        Equipment Recommendations  None recommended by PT    Recommendations for Other Services OT consult  Frequency 7X/week   Progress towards PT Goals    Plan      Precautions / Restrictions Precautions Precautions: Knee;Fall Required Braces or Orthoses: Knee Immobilizer - Right Knee Immobilizer - Right: Discontinue once straight leg raise with < 10 degree lag Restrictions Weight Bearing Restrictions: No Other Position/Activity Restrictions: WBAT   Pertinent Vitals/Pain 7/10; cold packs provided, RN aware    Mobility       Exercises Total Joint Exercises Ankle Circles/Pumps: AROM;Both;15 reps;Supine Quad Sets: AROM;Both;Supine;10 reps Heel Slides: AAROM;Supine;Right;20 reps Straight Leg Raises: AAROM;Right;Supine;15 reps   PT Diagnosis:    PT Problem List:   PT Treatment Interventions:     PT Goals (current goals can now be found in the care plan section) Acute Rehab PT Goals Patient Stated Goal: Walk without pain PT Goal Formulation: With patient Time For Goal Achievement: 02/27/13 Potential to Achieve Goals: Good  Visit Information  Last PT Received On: 02/21/13 Assistance Needed: +1    Subjective Data  Patient Stated Goal: Walk without pain   Cognition  Cognition Arousal/Alertness: Awake/alert Behavior During Therapy: WFL for tasks assessed/performed Overall Cognitive Status: Within Functional Limits for tasks assessed    Balance     End of Session PT - End of Session Equipment Utilized During Treatment: Right knee  immobilizer Activity Tolerance: Patient tolerated treatment well Patient left: in bed;with call bell/phone within reach;with family/visitor present Nurse Communication: Mobility status CPM Right Knee CPM Right Knee: On   GP     Andrea Norton 02/21/2013, 4:31 PM

## 2013-02-21 NOTE — Progress Notes (Signed)
Physical Therapy Treatment Patient Details Name: Andrea Norton MRN: [id number] DOB: 02-13-1946 Today's Date: 02/21/2013 Time: 4098-1191 PT Time Calculation (min): 23 min  PT Assessment / Plan / Recommendation  History of Present Illness     PT Comments     Follow Up Recommendations  Home health PT     Does the patient have the potential to tolerate intense rehabilitation     Barriers to Discharge        Equipment Recommendations  None recommended by PT    Recommendations for Other Services OT consult  Frequency 7X/week   Progress towards PT Goals Progress towards PT goals: Progressing toward goals  Plan Current plan remains appropriate    Precautions / Restrictions Precautions Precautions: Knee;Fall Required Braces or Orthoses: Knee Immobilizer - Right Knee Immobilizer - Right: Discontinue once straight leg raise with < 10 degree lag Restrictions Weight Bearing Restrictions: No Other Position/Activity Restrictions: WBAT   Pertinent Vitals/Pain 5/10; premed, ice pack provided    Mobility  Bed Mobility Bed Mobility: Supine to Sit Supine to Sit: 4: Min assist Details for Bed Mobility Assistance: min cues for sequencing` Transfers Transfers: Sit to Stand;Stand to Sit Sit to Stand: 4: Min assist Stand to Sit: 4: Min assist Details for Transfer Assistance: cues for LE management and use of UEs to self assist Ambulation/Gait Ambulation/Gait Assistance: 4: Min assist Ambulation Distance (Feet): 123 Feet Assistive device: Rolling walker Ambulation/Gait Assistance Details: cues for posture, sequence and position from RW Gait Pattern: Step-to pattern;Trunk flexed Stairs: No    Exercises Total Joint Exercises Ankle Circles/Pumps: AROM;Both;15 reps;Supine Quad Sets: AROM;Both;Supine;10 reps Heel Slides: AAROM;Supine;Right;20 reps Straight Leg Raises: AAROM;Right;Supine;15 reps   PT Diagnosis:    PT Problem List:   PT Treatment Interventions:     PT Goals (current  goals can now be found in the care plan section) Acute Rehab PT Goals Patient Stated Goal: Walk without pain PT Goal Formulation: With patient Time For Goal Achievement: 02/27/13 Potential to Achieve Goals: Good  Visit Information  Last PT Received On: 02/21/13 Assistance Needed: +1    Subjective Data  Subjective: Denies dizziness Patient Stated Goal: Walk without pain   Cognition  Cognition Arousal/Alertness: Awake/alert Behavior During Therapy: WFL for tasks assessed/performed Overall Cognitive Status: Within Functional Limits for tasks assessed    Balance     End of Session PT - End of Session Equipment Utilized During Treatment: Right knee immobilizer Activity Tolerance: Patient tolerated treatment well Patient left: in chair;with call bell/phone within reach Nurse Communication: Mobility status CPM Right Knee CPM Right Knee: On   GP     Yehuda Printup 02/21/2013, 4:54 PM

## 2013-02-21 NOTE — Op Note (Signed)
[name], [name] NO.:  [id number]  MEDICAL RECORD NO.:  [id number]  LOCATION:  1614                         FACILITY:  [facility]  PHYSICIAN:  Ollen Gross, M.D.    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  PROCEDURE:  Right total knee arthroplasty revision.  SURGEON:  Ollen Gross, M.D.  ASSISTANT:  Alexzandrew L. Perkins, PA-C.  ANESTHESIA:  General.  ESTIMATED BLOOD LOSS:  Minimal.  DRAINS:  Hemovac x1 with 1 limb in the knee and 1 limb in the subcutaneous tissue.  TOURNIQUET TIME:  Up 62 minutes at 300 mmHg, down 8 minutes, then up additional 25 minutes at 300 mmHg.  COMPLICATIONS:  None.  CONDITION:  Stable to recovery.  BRIEF CLINICAL NOTE:  Norton Norton is a 67 year old female, long complex history in regards to her right knee.  Currently, she has a revision total knee arthroplasty, tibial component has loosened.  She has had a remote history of infection, so she presents now for total knee arthroplasty revision versus resection arthroplasty based on intraoperative findings.  Preop lab findings do not show evidence of infection.  PROCEDURE IN DETAIL:  After successful administration of general anesthetic, a tourniquet was placed high on her right thigh and her right lower extremity was prepped and draped in the usual sterile fashion.  Extremities wrapped in Esmarch, knee flexed, and tourniquet inflated to 300 mmHg.  A midline incision was made with a 10-blade through subcutaneous tissue to the level of the extensor mechanism. Fresh blade was used to make a medial parapatellar arthrotomy.  Soft tissue of the proximal medial tibia was subperiosteally elevated to the joint line with a  knife and into the semimembranosus bursa with a Cobb elevator.  Soft tissue laterally was elevated with attention being paid to avoiding the patellar tendon on the tibial tubercle.  Patella was everted, knee flexed to 90 degrees.  There was a small amount of clear synovial fluid, which was sent for stat Gram-stain, which showed no organisms.  Also showed rare white blood cells.  We sent 2 separate specimens of synovium for frozen section and neither showed any acute inflammation.  We then subluxed the tibia forward and removed the tibial polyethylene.  We then subluxed the tibia forward, and placed retractors to protect the soft tissues and neurovascular structures.  An oscillating saw was used to disrupt the interface between the tibial component bone and revision tibial component was easily removed consistent with it being loose.  The tibial cement was also removed and tibial canal restored.  We then thoroughly irrigated the canal and began reaming up to 13 mm for eventual placement of a 13 mm stem.  We then disrupted the interface between the femoral component and bone. There was no evidence of any loosening of femoral component.  Components were removed with minimal if any bone loss.  We then accessed the femoral canal, irrigated, and reamed up to 16 mm for  eventual placement of a 16 mm stem.  The tibia was then addressed.  The retractors were again placed, and the extramedullary tibial cutting guide placed to resect a mm or so off the cut tibial surface.  Resection was made with an oscillating saw.  She already had the 30 mm sheath inserted previously so we really need to build up the joint line.  I wanted to place the 25 mm thickness tray, which is a size 2 at the base and extended to size 4 at the joint line.  We prepared a 29 mm sleeve.  We then prepared with the proximal reaming and then distal reaming for the stem.  The trial was a size 4 tibial tray, which had the 25 mm  buildup down to a size 2 at the level of bone with a 29 mm sleeve and a 13 x 30 stem extension.  The femur was then addressed.  The 16 reamer was placed to serve as our intramedullary cutting guide.  The distal femoral resection block was placed in 5 degrees of valgus.  The resection was set to take 2 mm off the bone.  We needed to build down the joint off the femur to get to the appropriate joint level.  We thus used an 8 mm augment medially and needed to go to a 12 mm augment laterally to get down to the appropriate joint line.  The size 4 was the most appropriate femoral component.  The size 4 cutting block was placed, rotation marked off the epicondylar axis.  We did not resect any bone anterior, posterior, or chamfer.  She previously had 8 mm augments posteriorly and we decided to go with those again to help close the flexion space.  We then prepared for a sleeve, which was the small sleeve.  We did reaming and broaching for that.  The femoral component trial was then assembled, which was a size 3 TC3 femur with 4 mm distal medial augment, 12 mm distal lateral augment, 8 mm posterior augments, a small sleeve, and a 16 x 75 stem extension.  The stem was in the +2 position 5 degrees valgus.  The trial was placed as well as a trial tibia.  We placed a 20 mm insert, and full extension was achieved with excellent varus-valgus and anterior-posterior balance throughout full range of motion.  Patella was everted again and it was covered with soft tissue.  Patelloplasty was performed to expose the patellar component.  The patellar component was well fixed, not worn and tracks normally.  We then let the tourniquet down for 8 minutes.  It was up for a total of 62 minutes. Down for 8 minutes.  Minor bleeding stopped with electrocautery and the final components were assembled on the back table.  After 8 minutes, we wrapped the leg in Esmarch, reinflated the tourniquet to 300 mmHg.  The trials  were removed and the surfaces of bone were irrigated with pulsatile lavage.  Cement restrictor for the tibia was trialed and size 3 was most appropriate.  Size 3 cement restrictor was placed.  Three batches of gentamicin impregnated cement were then mixed and once ready for implantation, the cement was injected in tibial canal, and on the cut tibial surface.  Tibial tray was then cemented into position, which is the same size as the trial.  It was impacted and had a very stable fit.  The extruded cement was removed.  On the femoral side, we cemented distally with a press-fit  stem.  The femur was impacted with excellent fit.  All extruded cement removed.  Trial 20 insert was placed, and there was a tiny bit of play with that, so we went to 22.5.  She lacked about 5 degrees from full extension, but had excellent stability throughout full range of motion.  The wound was then copiously irrigated with saline solution.  Once the cement was fully hardened, then the permanent 22.5 mm TC3 rotating platform insert was placed in tibial tray.  Then a total of 20 mL of Exparel with 30 mL of saline were injected into the extensor mechanism, and the periosteum of the femur as well as ligaments.  20 mL of 0.25% bupivacaine was also injected into the subcu in the extensor mechanism.  The arthrotomy was then closed over Hemovac drain with a running #1 V-Loc suture.  Flexion against gravity was 120 degrees.  Tourniquet was released.  Second tourniquet time of 25 minutes.  Subcu was then closed over Hemovac drain with interrupted 2-0 Vicryl.  The drains were hooked to suction.  Skin was closed with staples.  Incision was cleaned and dried and a bulky sterile dressing applied.  She was awakened and transported to recovery in stable condition.  Note that a surgical assistant was a medical necessity for this procedure in order to perform it in a safe and expeditious manner. Assistant was necessary for  retraction of vital neurovascular structures and for appropriate positioning of the limb to gain normal alignment of the prosthesis.     Ollen Gross, M.D.     FA/MEDQ  D:  02/20/2013  T:  02/21/2013  Job:  [id number]

## 2013-02-21 NOTE — Progress Notes (Signed)
Subjective: 1 Day Post-Op Procedure(s) (LRB): RIGHT TOTAL KNEE ARTHROPLASTY REVISION VERSUS RESECTION ARTHROPLASTY (Right) Patient reports pain as moderate.   Patient seen in rounds with Dr. Lequita Halt.  She had a rough night last night. Patient is having problems with pain in the knee, requiring pain medications We will start therapy today.  Plan is to go Home after hospital stay.  Objective: Vital signs in last 24 hours: Temp:  [97.5 F (36.4 C)-98.3 F (36.8 C)] 97.7 F (36.5 C) (07/31 1000) Pulse Rate:  [72-88] 78 (07/31 1000) Resp:  [12-21] 19 (07/31 1000) BP: (87-121)/(48-72) 94/64 mmHg (07/31 1000) SpO2:  [93 %-98 %] 93 % (07/31 1000) Weight:  [123.832 kg (273 lb)] 123.832 kg (273 lb) (07/30 1141)  Intake/Output from previous day:  Intake/Output Summary (Last 24 hours) at 02/21/13 1059 Last data filed at 02/21/13 0810  Gross per 24 hour  Intake 1878.75 ml  Output   1890 ml  Net -11.25 ml    Intake/Output this shift: Total I/O In: 240 [P.O.:240] Out: -   Labs:  Recent Labs  02/21/13 0415  HGB 10.2*    Recent Labs  02/21/13 0415  WBC 10.1  RBC 3.76*  HCT 31.8*  PLT 221    Recent Labs  02/21/13 0415  NA 136  K 4.7  CL 103  CO2 26  BUN 16  CREATININE 0.72  GLUCOSE 149*  CALCIUM 8.5   No results found for this basename: LABPT, INR,  in the last 72 hours  EXAM General - Patient is Alert, Appropriate and Oriented Extremity - Neurovascular intact Sensation intact distally Dorsiflexion/Plantar flexion intact Dressing - dressing C/D/I Motor Function - intact, moving foot and toes well on exam.  Hemovac pulled without difficulty.  Past Medical History  Diagnosis Date  . Hyperlipidemia   . Status post primary angioplasty with coronary stent     DRUG-ELUTING STENT X1  TO PROXIMAL LAD  . Chronic back pain   . Chronic neck pain   . Arthritis     HANDS, KNEES  . DDD (degenerative disc disease), cervical   . DDD (degenerative disc disease),  lumbar   . Radicular pain in left arm OCCASIONAL PAIN SHOOTING DOWN LEFT ARM SECAONDARY TO  CERVICAL DEGENERATION  . Hemorrhoid   . Diverticulitis of colon   . B12 deficiency anemia   . Vitamin D deficiency   . Hypothyroidism   . Synovitis of knee HYPERTROPHIC RIGHT KNEE  . Abnormal stress test 01-22-2009    LOW RISK ADENOSINE NUCLEAR STUDY W/ PROBABLE MILD APICAL THINNING BUT NO ISCHEMIA  . Depression     states well controlled  . Sleep apnea     couldnt tolerate machine- states last sleep study " many years ago"  . GERD (gastroesophageal reflux disease)   . Inguinal hernia, right     with ventral hernia  . Coronary artery disease 2010    STRESS TEST /, NOte Dr Kirtland Bouchard EPIC/ no cardiologist now--STATES HEART STENT PLACED 2010  . Hypertension       PCP Dr Lysbeth Galas    . Diabetes mellitus     ORAL MED - NO INSULIN  . Hemorrhoids     Assessment/Plan: 1 Day Post-Op Procedure(s) (LRB): RIGHT TOTAL KNEE ARTHROPLASTY REVISION VERSUS RESECTION ARTHROPLASTY (Right) Principal Problem:   Failed total knee arthroplasty Active Problems:   HYPOTHYROIDISM - resumed the levothyroxine   DYSLIPIDEMIA - resumed simvastatin   HYPERTENSION - resumed Toprol   GASTROESOPHAGEAL REFLUX DISEASE - resumed her pantoprazole  SLEEP APNEA - resumed her CPAP at night   Instability of prosthetic knee - admitted for revision   Postoperative anemia due to acute blood loss  Estimated body mass index is 46.15 kg/(m^2) as calculated from the following:   Height as of this encounter: 5' 4.5" (1.638 m).   Weight as of this encounter: 123.832 kg (273 lb). Advance diet Up with therapy Discharge home with home health when ready  DVT Prophylaxis - Xarelto Weight-Bearing as tolerated to right leg No vaccines. D/C O2 and Pulse OX and try on Room Air  PERKINS, Marlowe Sax 02/21/2013, 10:59 AM

## 2013-02-22 ENCOUNTER — Encounter (HOSPITAL_COMMUNITY): Payer: Self-pay | Admitting: Orthopedic Surgery

## 2013-02-22 LAB — CBC
HCT: 32.1 % — ABNORMAL LOW (ref 36.0–46.0)
Hemoglobin: 10.3 g/dL — ABNORMAL LOW (ref 12.0–15.0)
MCH: 27.3 pg (ref 26.0–34.0)
MCHC: 32.1 g/dL (ref 30.0–36.0)
MCV: 85.1 fL (ref 78.0–100.0)

## 2013-02-22 LAB — BASIC METABOLIC PANEL
BUN: 15 mg/dL (ref 6–23)
Calcium: 8.8 mg/dL (ref 8.4–10.5)
GFR calc non Af Amer: 89 mL/min — ABNORMAL LOW (ref >90)
Glucose, Bld: 144 mg/dL — ABNORMAL HIGH (ref 70–99)

## 2013-02-22 LAB — GLUCOSE, CAPILLARY: Glucose-Capillary: 121 mg/dL — ABNORMAL HIGH (ref 70–99)

## 2013-02-22 MED ORDER — RIVAROXABAN 10 MG PO TABS: 10.0000 mg | ORAL_TABLET | Freq: Every day | ORAL | Status: DC

## 2013-02-22 MED ORDER — METHOCARBAMOL 500 MG PO TABS: 500.0000 mg | ORAL_TABLET | Freq: Four times a day (QID) | ORAL | Status: DC | PRN

## 2013-02-22 MED ORDER — TRAMADOL HCL 50 MG PO TABS: 50.0000 mg | ORAL_TABLET | Freq: Four times a day (QID) | ORAL | Status: DC | PRN

## 2013-02-22 MED ORDER — OXYCODONE HCL 5 MG PO TABS: 5.0000 mg | ORAL_TABLET | ORAL | Status: DC | PRN

## 2013-02-22 NOTE — Progress Notes (Signed)
Inpatient Diabetes Program Recommendations  AACE/ADA: New Consensus Statement on Inpatient Glycemic Control (2013)  Target Ranges:  Prepandial:   less than 140 mg/dL      Peak postprandial:   less than 180 mg/dL (1-2 hours)      Critically ill patients:  140 - 180 mg/dL   Reason for Visit: Patient asked to see Diabetes Coordinator related to diabetes meal planning. Briefly discussed carbohydrate counting and reading labels.  Gave patient a Daily Meal Planning Guide.  Patient expressed interest in nutrition class at [facility].  Patient was given information for dietitian at [facility] who teaches class. Encouraged patient to monitor CBGs at least daily at home and record for MD. No further questions/concerns at this time. Thank you  Piedad Climes BSN, RN,CDE Inpatient Diabetes Coordinator [phone] (team pager)

## 2013-02-22 NOTE — Discharge Summary (Signed)
Physician Discharge Summary   Patient ID: Andrea Norton MRN: [id number] DOB/AGE: 09-07-1945 67 y.o.  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  Admission Diagnoses:  Past Medical History  Diagnosis Date  . Hyperlipidemia   . Status post primary angioplasty with coronary stent     DRUG-ELUTING STENT X1  TO PROXIMAL LAD  . Chronic back pain   . Chronic neck pain   . Arthritis     HANDS, KNEES  . DDD (degenerative disc disease), cervical   . DDD (degenerative disc disease), lumbar   . Radicular pain in left arm OCCASIONAL PAIN SHOOTING DOWN LEFT ARM SECAONDARY TO  CERVICAL DEGENERATION  . Hemorrhoid   . Diverticulitis of colon   . B12 deficiency anemia   . Vitamin D deficiency   . Hypothyroidism   . Synovitis of knee HYPERTROPHIC RIGHT KNEE  . Abnormal stress test 01-22-2009    LOW RISK ADENOSINE NUCLEAR STUDY W/ PROBABLE MILD APICAL THINNING BUT NO ISCHEMIA  . Depression     states well controlled  . Sleep apnea     couldnt tolerate machine- states last sleep study " many years ago"  . GERD (gastroesophageal reflux disease)   . Inguinal hernia, right     with ventral hernia  . Coronary artery disease 2010    STRESS TEST /, NOte Dr Kirtland Bouchard EPIC/ no cardiologist now--STATES HEART STENT PLACED 2010  . Hypertension       PCP Dr Lysbeth Galas    . Diabetes mellitus     ORAL MED - NO INSULIN  . Hemorrhoids    Discharge Diagnoses:   Principal Problem:   Failed total knee arthroplasty Active Problems:   HYPOTHYROIDISM   DYSLIPIDEMIA   HYPERTENSION   GASTROESOPHAGEAL REFLUX DISEASE   SLEEP APNEA   Instability of prosthetic knee   Postoperative anemia due to acute blood loss  Estimated body mass index is 46.15 kg/(m^2) as calculated from the following:   Height as of this encounter: 5' 4.5" (1.638 m).   Weight as of this encounter: 123.832 kg (273 lb).  Procedure:  Procedure(s)  (LRB): RIGHT TOTAL KNEE ARTHROPLASTY REVISION VERSUS RESECTION ARTHROPLASTY (Right)   Consults: None  HPI: Ms. Despain is a 67 year old female, long complex  history in regards to her right knee. Currently, she has a revision  total knee arthroplasty, tibial component has loosened. She has had a  remote history of infection, so she presents now for total knee  arthroplasty revision versus resection arthroplasty based on  intraoperative findings. Preop lab findings do not show evidence of  infection.  Laboratory Data: Admission on 02/20/2013, Discharged on 02/22/2013  Component Date Value Range Status  . Glucose-Capillary 02/20/2013 106* 70 - 99 mg/dL Final  . Comment 1 40/98/1191 Documented in Chart   Final  . Specimen Description 02/20/2013 SYNOVIAL RT KNEE   Final  . Special Requests 02/20/2013 PATIENT ON FOLLOWING ANCEF 3GRAMS   Final  . Gram Stain 02/20/2013    Final                   Value:NO ORGANISMS SEEN                         RARE WBC PRESENT, PREDOMINANTLY PMN                         Gram Stain Report Called  to,Read Back By and Verified With: T SHYSHKO AT 0839 ON 07.30.2014 BY NBROOKS  . Report Status 02/20/2013 02/20/2013 FINAL   Final  . Specimen Description 02/20/2013 SYNOVIAL RT KNEE   Final  . Special Requests 02/20/2013 PATIENT ON FOLLOWING ANCEF 3GRAMS   Final  . Gram Stain 02/20/2013    Final                   Value:RARE WBC PRESENT, PREDOMINANTLY PMN                         NO ORGANISMS SEEN                         Gram Stain Report Called to,Read Back By and Verified With: Gram Stain Report Called to,Read Back By and Verified With: T Lona Kettle [id number] 02/20/13 BY NBROOKS Performed by [facility]  . Culture 02/20/2013 NO GROWTH 3 DAYS   Final  . Report Status 02/20/2013 02/23/2013 FINAL   Final  . Specimen Description 02/20/2013 SYNOVIAL RT KNEE   Final  . Special Requests 02/20/2013 PATIENT ON FOLLOWING ANCEF 3GRAMS   Final  . Gram Stain 02/20/2013    Final                    Value:RARE WBC NO ORGANISMS SEEN                         Performed at [facility]  . Culture 02/20/2013    Final                   Value:NO ANAEROBES ISOLATED                         Performed at [facility]  . Report Status 02/20/2013 02/25/2013 FINAL   Final  . Glucose-Capillary 02/20/2013 139* 70 - 99 mg/dL Final  . Comment 1 04/54/0981 Documented in Chart   Final  . Comment 2 02/20/2013 Notify RN   Final  . Glucose-Capillary 02/20/2013 151* 70 - 99 mg/dL Final  . WBC 19/14/7829 10.1  4.0 - 10.5 K/uL Final  . RBC 02/21/2013 3.76* 3.87 - 5.11 MIL/uL Final  . Hemoglobin 02/21/2013 10.2* 12.0 - 15.0 g/dL Final  . HCT 56/21/3086 31.8* 36.0 - 46.0 % Final  . MCV 02/21/2013 84.6  78.0 - 100.0 fL Final  . MCH 02/21/2013 27.1  26.0 - 34.0 pg Final  . MCHC 02/21/2013 32.1  30.0 - 36.0 g/dL Final  . RDW 57/84/6962 14.3  11.5 - 15.5 % Final  . Platelets 02/21/2013 221  150 - 400 K/uL Final  . Sodium 02/21/2013 136  135 - 145 mEq/L Final  . Potassium 02/21/2013 4.7  3.5 - 5.1 mEq/L Final   Comment: SLIGHT HEMOLYSIS                          HEMOLYSIS AT THIS LEVEL MAY AFFECT RESULT  . Chloride 02/21/2013 103  96 - 112 mEq/L Final  . CO2 02/21/2013 26  19 - 32 mEq/L Final  . Glucose, Bld 02/21/2013 149* 70 - 99 mg/dL Final  . BUN 95/28/4132 16  6 - 23 mg/dL Final  . Creatinine, Ser 02/21/2013 0.72  0.50 - 1.10 mg/dL Final  . Calcium 44/07/270 8.5  8.4 - 10.5 mg/dL Final  .  GFR calc non Af Amer 02/21/2013 88* >90 mL/min Final  . GFR calc Af Amer 02/21/2013 >90  >90 mL/min Final   Comment:                                 The eGFR has been calculated                          using the CKD EPI equation.                          This calculation has not been                          validated in all clinical                          situations.                          eGFR's persistently                          <90 mL/min signify                           possible Chronic Kidney Disease.  . Glucose-Capillary 02/20/2013 181* 70 - 99 mg/dL Final  . Glucose-Capillary 02/20/2013 128* 70 - 99 mg/dL Final  . WBC 16/04/9603 9.6  4.0 - 10.5 K/uL Final  . RBC 02/22/2013 3.77* 3.87 - 5.11 MIL/uL Final  . Hemoglobin 02/22/2013 10.3* 12.0 - 15.0 g/dL Final  . HCT 54/03/8118 32.1* 36.0 - 46.0 % Final  . MCV 02/22/2013 85.1  78.0 - 100.0 fL Final  . MCH 02/22/2013 27.3  26.0 - 34.0 pg Final  . MCHC 02/22/2013 32.1  30.0 - 36.0 g/dL Final  . RDW 14/78/2956 14.4  11.5 - 15.5 % Final  . Platelets 02/22/2013 223  150 - 400 K/uL Final  . Sodium 02/22/2013 137  135 - 145 mEq/L Final  . Potassium 02/22/2013 5.2* 3.5 - 5.1 mEq/L Final  . Chloride 02/22/2013 103  96 - 112 mEq/L Final  . CO2 02/22/2013 30  19 - 32 mEq/L Final  . Glucose, Bld 02/22/2013 144* 70 - 99 mg/dL Final  . BUN 21/30/8657 15  6 - 23 mg/dL Final  . Creatinine, Ser 02/22/2013 0.69  0.50 - 1.10 mg/dL Final  . Calcium 84/69/6295 8.8  8.4 - 10.5 mg/dL Final  . GFR calc non Af Amer 02/22/2013 89* >90 mL/min Final  . GFR calc Af Amer 02/22/2013 >90  >90 mL/min Final   Comment:                                 The eGFR has been calculated                          using the CKD EPI equation.                          This calculation has not been  validated in all clinical                          situations.                          eGFR's persistently                          <90 mL/min signify                          possible Chronic Kidney Disease.  . Glucose-Capillary 02/21/2013 127* 70 - 99 mg/dL Final  . Comment 1 40/98/1191 Documented in Chart   Final  . Comment 2 02/21/2013 Notify RN   Final  . Glucose-Capillary 02/21/2013 105* 70 - 99 mg/dL Final  . Comment 1 47/82/9562 Documented in Chart   Final  . Comment 2 02/21/2013 Notify RN   Final  . Glucose-Capillary 02/21/2013 140* 70 - 99 mg/dL Final  . Glucose-Capillary 02/21/2013 125* 70 - 99 mg/dL Final  .  Glucose-Capillary 02/22/2013 121* 70 - 99 mg/dL Final  . Glucose-Capillary 02/22/2013 109* 70 - 99 mg/dL Final  Hospital Outpatient Visit on 02/12/2013  Component Date Value Range Status  . MRSA, PCR 02/12/2013 NEGATIVE  NEGATIVE Final  . Staphylococcus aureus 02/12/2013 NEGATIVE  NEGATIVE Final   Comment:                                 The Xpert SA Assay (FDA                          approved for NASAL specimens                          in patients over 37 years of age),                          is one component of                          a comprehensive surveillance                          program.  Test performance has                          been validated by [employer] for patients greater                          than or equal to 34 year old.                          It is not intended                          to diagnose infection nor to  guide or monitor treatment.  Marland Kitchen aPTT 02/12/2013 31  24 - 37 seconds Final  . WBC 02/12/2013 7.5  4.0 - 10.5 K/uL Final  . RBC 02/12/2013 4.89  3.87 - 5.11 MIL/uL Final  . Hemoglobin 02/12/2013 13.4  12.0 - 15.0 g/dL Final  . HCT 16/04/9603 41.4  36.0 - 46.0 % Final  . MCV 02/12/2013 84.7  78.0 - 100.0 fL Final  . MCH 02/12/2013 27.4  26.0 - 34.0 pg Final  . MCHC 02/12/2013 32.4  30.0 - 36.0 g/dL Final  . RDW 54/03/8118 14.4  11.5 - 15.5 % Final  . Platelets 02/12/2013 310  150 - 400 K/uL Final  . Sodium 02/12/2013 139  135 - 145 mEq/L Final  . Potassium 02/12/2013 4.6  3.5 - 5.1 mEq/L Final  . Chloride 02/12/2013 103  96 - 112 mEq/L Final  . CO2 02/12/2013 29  19 - 32 mEq/L Final  . Glucose, Bld 02/12/2013 86  70 - 99 mg/dL Final  . BUN 14/78/2956 13  6 - 23 mg/dL Final  . Creatinine, Ser 02/12/2013 0.70  0.50 - 1.10 mg/dL Final  . Calcium 21/30/8657 9.8  8.4 - 10.5 mg/dL Final  . Total Protein 02/12/2013 7.4  6.0 - 8.3 g/dL Final  . Albumin 84/69/6295 3.2* 3.5 - 5.2 g/dL Final  . AST  28/41/3244 16  0 - 37 U/L Final  . ALT 02/12/2013 14  0 - 35 U/L Final  . Alkaline Phosphatase 02/12/2013 79  39 - 117 U/L Final  . Total Bilirubin 02/12/2013 0.6  0.3 - 1.2 mg/dL Final  . GFR calc non Af Amer 02/12/2013 89* >90 mL/min Final  . GFR calc Af Amer 02/12/2013 >90  >90 mL/min Final   Comment:                                 The eGFR has been calculated                          using the CKD EPI equation.                          This calculation has not been                          validated in all clinical                          situations.                          eGFR's persistently                          <90 mL/min signify                          possible Chronic Kidney Disease.  Marland Kitchen Prothrombin Time 02/12/2013 12.6  11.6 - 15.2 seconds Final  . INR 02/12/2013 0.96  0.00 - 1.49 Final  . ABO/RH(D) 02/12/2013 O POS   Final  . Antibody Screen 02/12/2013 NEG   Final  . Sample Expiration 02/12/2013 02/23/2013   Final  . Color, Urine 02/12/2013 YELLOW  YELLOW Final  . APPearance 02/12/2013 CLOUDY* CLEAR  Final  . Specific Gravity, Urine 02/12/2013 1.024  1.005 - 1.030 Final  . pH 02/12/2013 7.5  5.0 - 8.0 Final  . Glucose, UA 02/12/2013 NEGATIVE  NEGATIVE mg/dL Final  . Hgb urine dipstick 02/12/2013 NEGATIVE  NEGATIVE Final  . Bilirubin Urine 02/12/2013 NEGATIVE  NEGATIVE Final  . Ketones, ur 02/12/2013 NEGATIVE  NEGATIVE mg/dL Final  . Protein, ur 40/98/1191 NEGATIVE  NEGATIVE mg/dL Final  . Urobilinogen, UA 02/12/2013 1.0  0.0 - 1.0 mg/dL Final  . Nitrite 47/82/9562 POSITIVE* NEGATIVE Final  . Leukocytes, UA 02/12/2013 SMALL* NEGATIVE Final  . Squamous Epithelial / LPF 02/12/2013 RARE  RARE Final  . WBC, UA 02/12/2013 3-6  <3 WBC/hpf Final  . Bacteria, UA 02/12/2013 MANY* RARE Final  . Specimen Description 02/12/2013 URINE, RANDOM   Final  . Special Requests 02/12/2013 NONE   Final  . Culture  Setup Time 02/12/2013 02/12/2013 22:02   Final  . Colony Count  02/12/2013 >=100,000 COLONIES/ML   Final  . Culture 02/12/2013 KLEBSIELLA PNEUMONIAE   Final  . Report Status 02/12/2013 02/14/2013 FINAL   Final  . Organism ID, Bacteria 02/12/2013 KLEBSIELLA PNEUMONIAE   Final     X-Rays:Dg Chest 2 View  02/12/2013   *RADIOLOGY REPORT*  Clinical Data: Preoperative evaluation for right total knee arthroplasty.  Hypertension and diabetes  CHEST - 2 VIEW  Comparison: 08/03/2011  Findings: Heart and mediastinal contours are within normal limits. A prominent fat pad is again noted in the right cardiophrenic angle.  The lung fields appear clear with no signs of focal infiltrate or congestive failure.  No pleural fluid or significant peribronchial cuffing is noted.  Bony structures appear intact.  IMPRESSION: Stable cardiopulmonary appearance with no new focal or acute abnormality identified.   Original Report Authenticated By: Rhodia Albright, M.D.    EKG: Orders placed during the hospital encounter of 02/20/13  . EKG     Hospital Course: ANELIS HRIVNAK is a 67 y.o. who was admitted to [facility]. They were brought to the operating room on 02/20/2013 and underwent Procedure(s): RIGHT TOTAL KNEE ARTHROPLASTY REVISION VERSUS RESECTION ARTHROPLASTY.  Patient tolerated the procedure well and was later transferred to the recovery room and then to the orthopaedic floor for postoperative care.  They were given PO and IV analgesics for pain control following their surgery.  They were given 24 hours of postoperative antibiotics of  Anti-infectives   Start     Dose/Rate Route Frequency Ordered Stop   02/20/13 1400  ceFAZolin (ANCEF) IVPB 2 g/50 mL premix     2 g 100 mL/hr over 30 Minutes Intravenous Every 6 hours 02/20/13 1139 02/20/13 2117   02/20/13 0513  ceFAZolin (ANCEF) 3 g in dextrose 5 % 50 mL IVPB     3 g 160 mL/hr over 30 Minutes Intravenous On call to O.R. 02/20/13 1308 02/20/13 0730     and started on DVT prophylaxis in the form of Xarelto.   PT and  OT were ordered for total joint protocol.  Discharge planning consulted to help with postop disposition and equipment needs.  Patient had a rough night on the evening of surgery.  They started to get up OOB with therapy on day one. Hemovac drain was pulled without difficulty.  Continued to work with therapy into day two.  Dressing was changed on day two and the incision was healing well.  Patient was seen in rounds and was ready to go home later on POD 2.  Discharge Medications: Prior to Admission medications   Medication Sig Start Date End Date Taking? Authorizing Provider  escitalopram (LEXAPRO) 10 MG tablet Take 10 mg by mouth every evening.    Yes Historical Provider, MD  furosemide (LASIX) 20 MG tablet Take 20 mg by mouth daily with breakfast.    Yes Historical Provider, MD  gabapentin (NEURONTIN) 100 MG capsule Take 200 mg by mouth 2 (two) times daily.   Yes Historical Provider, MD  levothyroxine (SYNTHROID, LEVOTHROID) 125 MCG tablet Take 112 mcg by mouth daily before breakfast.    Yes Historical Provider, MD  metFORMIN (GLUCOPHAGE) 500 MG tablet Take 500 mg by mouth daily with breakfast.   Yes Historical Provider, MD  metoprolol succinate (TOPROL-XL) 25 MG 24 hr tablet Take 25 mg by mouth daily with breakfast.    Yes Historical Provider, MD  pantoprazole (PROTONIX) 40 MG tablet Take 40 mg by mouth at bedtime.    Yes Historical Provider, MD  potassium chloride SA (K-DUR,KLOR-CON) 20 MEQ tablet Take 20 mEq by mouth daily with breakfast.    Yes Historical Provider, MD  simvastatin (ZOCOR) 40 MG tablet Take 40 mg by mouth every morning.    Yes Historical Provider, MD  ketoconazole (NIZORAL) 2 % cream Apply 1 application topically daily.  05/01/12   Historical Provider, MD  methocarbamol (ROBAXIN) 500 MG tablet Take 1 tablet (500 mg total) by mouth every 6 (six) hours as needed. 02/22/13   Alexzandrew Perkins, PA-C  oxyCODONE (OXY IR/ROXICODONE) 5 MG immediate release tablet Take 1-4 tablets (5-20  mg total) by mouth every 3 (three) hours as needed. 02/22/13   Alexzandrew Julien Girt, PA-C  rivaroxaban (XARELTO) 10 MG TABS tablet Take 1 tablet (10 mg total) by mouth daily with breakfast. Take Xarelto for two and a half more weeks, then discontinue Xarelto. Once the patient has completed the Xarelto, they may resume the 81 mg Aspirin. 02/22/13   Alexzandrew Perkins, PA-C  traMADol (ULTRAM) 50 MG tablet Take 1-2 tablets (50-100 mg total) by mouth every 6 (six) hours as needed (mild pain). 02/22/13   Alexzandrew Julien Girt, PA-C    Discharge home with home health  Diet - Cardiac diet and Diabetic diet  Follow up - in 2 weeks  Activity - WBAT  Disposition - Home  Condition Upon Discharge - Good  D/C Meds - See DC Summary  DVT Prophylaxis - Xarelto       Discharge Orders   Future Orders Complete By Expires     Call MD / Call 911  As directed     Comments:      If you experience chest pain or shortness of breath, CALL 911 and be transported to the hospital emergency room.  If you develope a fever above 101 F, pus (white drainage) or increased drainage or redness at the wound, or calf pain, call your surgeon's office.    Change dressing  As directed     Comments:      Change dressing daily with sterile 4 x 4 inch gauze dressing and apply TED hose. Do not submerge the incision under water.    Constipation Prevention  As directed     Comments:      Drink plenty of fluids.  Prune juice may be helpful.  You may use a stool softener, such as Colace (over the counter) 100 mg twice a day.  Use MiraLax (over the counter) for constipation as needed.    Diet - low sodium heart healthy  As directed  Discharge instructions  As directed     Comments:      Pick up stool softner and laxative for home. Do not submerge incision under water. May shower. Continue to use ice for pain and swelling from surgery.  Take Xarelto for two and a half more weeks, then discontinue Xarelto. Once the patient has  completed the Xarelto, they may resume the 81 mg Aspirin.    Do not put a pillow under the knee. Place it under the heel.  As directed     Do not sit on low chairs, stoools or toilet seats, as it may be difficult to get up from low surfaces  As directed     Driving restrictions  As directed     Comments:      No driving until released by the physician.    Increase activity slowly as tolerated  As directed     Lifting restrictions  As directed     Comments:      No lifting until released by the physician.    Patient may shower  As directed     Comments:      You may shower without a dressing once there is no drainage.  Do not wash over the wound.  If drainage remains, do not shower until drainage stops.    TED hose  As directed     Comments:      Use stockings (TED hose) for 3 weeks on both leg(s).  You may remove them at night for sleeping.    Weight bearing as tolerated  As directed         Medication List    STOP taking these medications       aspirin 81 MG tablet     B Complex Vitamins Inj     Vitamin D (Ergocalciferol) 50000 UNITS Caps capsule  Commonly known as:  DRISDOL      TAKE these medications       escitalopram 10 MG tablet  Commonly known as:  LEXAPRO  Take 10 mg by mouth every evening.     furosemide 20 MG tablet  Commonly known as:  LASIX  Take 20 mg by mouth daily with breakfast.     gabapentin 100 MG capsule  Commonly known as:  NEURONTIN  Take 200 mg by mouth 2 (two) times daily.     ketoconazole 2 % cream  Commonly known as:  NIZORAL  Apply 1 application topically daily.     levothyroxine 125 MCG tablet  Commonly known as:  SYNTHROID, LEVOTHROID  Take 112 mcg by mouth daily before breakfast.     metFORMIN 500 MG tablet  Commonly known as:  GLUCOPHAGE  Take 500 mg by mouth daily with breakfast.     methocarbamol 500 MG tablet  Commonly known as:  ROBAXIN  Take 1 tablet (500 mg total) by mouth every 6 (six) hours as needed.     metoprolol  succinate 25 MG 24 hr tablet  Commonly known as:  TOPROL-XL  Take 25 mg by mouth daily with breakfast.     oxyCODONE 5 MG immediate release tablet  Commonly known as:  Oxy IR/ROXICODONE  Take 1-4 tablets (5-20 mg total) by mouth every 3 (three) hours as needed.     pantoprazole 40 MG tablet  Commonly known as:  PROTONIX  Take 40 mg by mouth at bedtime.     potassium chloride SA 20 MEQ tablet  Commonly known as:  K-DUR,KLOR-CON  Take 20  mEq by mouth daily with breakfast.     rivaroxaban 10 MG Tabs tablet  Commonly known as:  XARELTO  - Take 1 tablet (10 mg total) by mouth daily with breakfast. Take Xarelto for two and a half more weeks, then discontinue Xarelto.  - Once the patient has completed the Xarelto, they may resume the 81 mg Aspirin.     simvastatin 40 MG tablet  Commonly known as:  ZOCOR  Take 40 mg by mouth every morning.     traMADol 50 MG tablet  Commonly known as:  ULTRAM  Take 1-2 tablets (50-100 mg total) by mouth every 6 (six) hours as needed (mild pain).       Follow-up Information   Follow up with Marguarite Arbour, CRTT. Schedule an appointment as soon as possible for a visit in 2 weeks.   Contact information:   - - - - [place] [address]       Signed: Patrica Duel 02/27/2013, 6:45 PM

## 2013-02-22 NOTE — Progress Notes (Signed)
OT Cancellation Note  Patient Details Name: Andrea Norton MRN: [id number] DOB: 02-May-1946   Cancelled Treatment:    Reason Eval/Treat Not Completed: OT screened, no needs identified, will sign off  Paylin Hailu 02/22/2013, 10:27 AM Marica Otter, OTR/L [phone] 02/22/2013

## 2013-02-22 NOTE — Progress Notes (Signed)
Physical Therapy Treatment Patient Details Name: Andrea Norton MRN: [id number] DOB: 28-Feb-1946 Today's Date: 02/22/2013 Time: 4098-1191 PT Time Calculation (min): 40 min  PT Assessment / Plan / Recommendation  History of Present Illness     PT Comments     Follow Up Recommendations  Home health PT     Does the patient have the potential to tolerate intense rehabilitation     Barriers to Discharge        Equipment Recommendations  None recommended by PT    Recommendations for Other Services OT consult  Frequency 7X/week   Progress towards PT Goals Progress towards PT goals: Progressing toward goals  Plan Current plan remains appropriate    Precautions / Restrictions Precautions Precautions: Knee;Fall Required Braces or Orthoses: Knee Immobilizer - Right Knee Immobilizer - Right: Discontinue once straight leg raise with < 10 degree lag Restrictions Weight Bearing Restrictions: No Other Position/Activity Restrictions: WBAT   Pertinent Vitals/Pain 5/10; premed, cold packs provided    Mobility  Bed Mobility Bed Mobility: Supine to Sit Supine to Sit: 4: Min assist Details for Bed Mobility Assistance: min cues for sequencing` Transfers Transfers: Sit to Stand;Stand to Sit Sit to Stand: 5: Supervision Stand to Sit: 5: Supervision Transfer via Scientist, water quality Details for Transfer Assistance: cues for LE management and use of UEs to self assist Ambulation/Gait Ambulation/Gait Assistance: 5: Supervision Ambulation Distance (Feet): 123 Feet Assistive device: Rolling walker Ambulation/Gait Assistance Details: min cues for posture and position from RW Gait Pattern: Step-to pattern;Trunk flexed Stairs: Yes Stairs Assistance: 4: Min assist Stairs Assistance Details (indicate cue type and reason): cues for foot/RW placement and sequence Stair Management Technique: No rails;Step to pattern;Backwards;Forwards;With walker Number of Stairs: 2 (1 fwd, 1 bkwd)     Exercises Total Joint Exercises Ankle Circles/Pumps: AROM;Both;15 reps;Supine Quad Sets: AROM;Both;Supine;20 reps Heel Slides: AAROM;Supine;Right;20 reps Straight Leg Raises: AAROM;Right;Supine;20 reps   PT Diagnosis:    PT Problem List:   PT Treatment Interventions:     PT Goals (current goals can now be found in the care plan section) Acute Rehab PT Goals Patient Stated Goal: Walk without pain PT Goal Formulation: With patient Time For Goal Achievement: 02/27/13 Potential to Achieve Goals: Good  Visit Information  Last PT Received On: 02/22/13 Assistance Needed: +1    Subjective Data  Patient Stated Goal: Walk without pain   Cognition  Cognition Arousal/Alertness: Awake/alert Behavior During Therapy: WFL for tasks assessed/performed Overall Cognitive Status: Within Functional Limits for tasks assessed    Balance     End of Session PT - End of Session Equipment Utilized During Treatment: Right knee immobilizer Activity Tolerance: Patient tolerated treatment well Patient left: in chair;with call bell/phone within reach Nurse Communication: Mobility status   GP     Andrea Norton 02/22/2013, 1:01 PM

## 2013-02-22 NOTE — Care Management Note (Signed)
    Page 1 of 1   02/22/2013     1:45:36 PM   CARE MANAGEMENT NOTE 02/22/2013  Patient:  ZAAKIRAH, KISTNER   Account Number:  [id number]  Date Initiated:  02/21/2013  Documentation initiated by:  Colleen Can  Subjective/Objective Assessment:   dx failed rt total knee replacemnt; Rt total knee revision.     Action/Plan:   CM spoke with patient. Plans are for her to return to her home in [place] where she will have caregiver. Already has RW.   Anticipated DC Date:  02/22/2013   Anticipated DC Plan:  HOME W HOME HEALTH SERVICES      DC Planning Services  CM consult      [facility] Choice  HOME HEALTH   Choice offered to / List presented to:  C-1 Patient        HH arranged  HH-2 PT      [facility] agency  [facility]   Status of service:  Completed, signed off Medicare Important Message given?   (If response is "NO", the following Medicare IM given date fields will be blank) Date Medicare IM given:   Date Additional Medicare IM given:    Discharge Disposition:  HOME W HOME HEALTH SERVICES  Per UR Regulation:    If discussed at Long Length of Stay Meetings, dates discussed:    Comments:  02/22/2013 Colleen Can BSN RN CCM [phone] [facility] will provide HHpt services with 48hrs of discharge.

## 2013-02-22 NOTE — Progress Notes (Signed)
Subjective: 2 Days Post-Op Procedure(s) (LRB): RIGHT TOTAL KNEE ARTHROPLASTY REVISION VERSUS RESECTION ARTHROPLASTY (Right) Patient reports pain as 10 on 0-10 scale.   Patient seen in rounds with Dr. Lequita Halt. Patient is well, and has had no acute complaints or problems Patient is ready to go home today.  Objective: Vital signs in last 24 hours: Temp:  [97.7 F (36.5 C)-99 F (37.2 C)] 98.1 F (36.7 C) (08/01 0453) Pulse Rate:  [69-83] 69 (08/01 0453) Resp:  [18-20] 18 (08/01 0453) BP: (87-134)/(55-82) 125/79 mmHg (08/01 0453) SpO2:  [91 %-94 %] 94 % (08/01 0453)  Intake/Output from previous day:  Intake/Output Summary (Last 24 hours) at 02/22/13 0719 Last data filed at 02/22/13 0454  Gross per 24 hour  Intake   1280 ml  Output   2300 ml  Net  -1020 ml    Intake/Output this shift:    Labs:  Recent Labs  02/21/13 0415 02/22/13 0411  HGB 10.2* 10.3*    Recent Labs  02/21/13 0415 02/22/13 0411  WBC 10.1 9.6  RBC 3.76* 3.77*  HCT 31.8* 32.1*  PLT 221 223    Recent Labs  02/21/13 0415 02/22/13 0411  NA 136 137  K 4.7 5.2*  CL 103 103  CO2 26 30  BUN 16 15  CREATININE 0.72 0.69  GLUCOSE 149* 144*  CALCIUM 8.5 8.8   No results found for this basename: LABPT, INR,  in the last 72 hours  EXAM: General - Patient is Alert, Appropriate and Oriented Extremity - Neurovascular intact Sensation intact distally Dorsiflexion/Plantar flexion intact No cellulitis present Incision - clean, dry, no drainage, healing Motor Function - intact, moving foot and toes well on exam.   Assessment/Plan: 2 Days Post-Op Procedure(s) (LRB): RIGHT TOTAL KNEE ARTHROPLASTY REVISION VERSUS RESECTION ARTHROPLASTY (Right) Procedure(s) (LRB): RIGHT TOTAL KNEE ARTHROPLASTY REVISION VERSUS RESECTION ARTHROPLASTY (Right) Past Medical History  Diagnosis Date  . Hyperlipidemia   . Status post primary angioplasty with coronary stent     DRUG-ELUTING STENT X1  TO PROXIMAL LAD  .  Chronic back pain   . Chronic neck pain   . Arthritis     HANDS, KNEES  . DDD (degenerative disc disease), cervical   . DDD (degenerative disc disease), lumbar   . Radicular pain in left arm OCCASIONAL PAIN SHOOTING DOWN LEFT ARM SECAONDARY TO  CERVICAL DEGENERATION  . Hemorrhoid   . Diverticulitis of colon   . B12 deficiency anemia   . Vitamin D deficiency   . Hypothyroidism   . Synovitis of knee HYPERTROPHIC RIGHT KNEE  . Abnormal stress test 01-22-2009    LOW RISK ADENOSINE NUCLEAR STUDY W/ PROBABLE MILD APICAL THINNING BUT NO ISCHEMIA  . Depression     states well controlled  . Sleep apnea     couldnt tolerate machine- states last sleep study " many years ago"  . GERD (gastroesophageal reflux disease)   . Inguinal hernia, right     with ventral hernia  . Coronary artery disease 2010    STRESS TEST /, NOte Dr Kirtland Bouchard EPIC/ no cardiologist now--STATES HEART STENT PLACED 2010  . Hypertension       PCP Dr Lysbeth Galas    . Diabetes mellitus     ORAL MED - NO INSULIN  . Hemorrhoids    Principal Problem:   Failed total knee arthroplasty Active Problems:   HYPOTHYROIDISM   DYSLIPIDEMIA   HYPERTENSION   GASTROESOPHAGEAL REFLUX DISEASE   SLEEP APNEA   Instability of prosthetic knee  Postoperative anemia due to acute blood loss  Estimated body mass index is 46.15 kg/(m^2) as calculated from the following:   Height as of this encounter: 5' 4.5" (1.638 m).   Weight as of this encounter: 123.832 kg (273 lb). Up with therapy Discharge home with home health Diet - Cardiac diet and Diabetic diet Follow up - in 2 weeks Activity - WBAT Disposition - Home Condition Upon Discharge - Good D/C Meds - See DC Summary DVT Prophylaxis - Xarelto  Marcha Licklider 02/22/2013, 7:19 AM

## 2013-02-23 LAB — BODY FLUID CULTURE: Culture: NO GROWTH

## 2013-02-25 LAB — ANAEROBIC CULTURE

## 2013-03-26 ENCOUNTER — Ambulatory Visit: Payer: Medicare Other | Attending: Orthopedic Surgery | Admitting: Physical Therapy

## 2013-03-26 DIAGNOSIS — R5381 Other malaise: Secondary | ICD-10-CM | POA: Insufficient documentation

## 2013-03-26 DIAGNOSIS — M25569 Pain in unspecified knee: Secondary | ICD-10-CM | POA: Insufficient documentation

## 2013-03-26 DIAGNOSIS — Z96659 Presence of unspecified artificial knee joint: Secondary | ICD-10-CM | POA: Insufficient documentation

## 2013-03-26 DIAGNOSIS — IMO0001 Reserved for inherently not codable concepts without codable children: Secondary | ICD-10-CM | POA: Insufficient documentation

## 2013-03-26 DIAGNOSIS — R269 Unspecified abnormalities of gait and mobility: Secondary | ICD-10-CM | POA: Insufficient documentation

## 2013-03-28 ENCOUNTER — Ambulatory Visit: Payer: Medicare Other | Admitting: Physical Therapy

## 2013-04-01 ENCOUNTER — Ambulatory Visit: Payer: Medicare Other | Admitting: Physical Therapy

## 2013-04-03 ENCOUNTER — Ambulatory Visit: Payer: Medicare Other | Admitting: Physical Therapy

## 2013-04-05 ENCOUNTER — Ambulatory Visit: Payer: Medicare Other

## 2013-04-08 ENCOUNTER — Ambulatory Visit: Payer: Medicare Other | Admitting: Physical Therapy

## 2013-04-10 ENCOUNTER — Ambulatory Visit: Payer: Medicare Other | Admitting: Physical Therapy

## 2013-04-12 ENCOUNTER — Ambulatory Visit: Payer: Medicare Other | Admitting: *Deleted

## 2013-04-15 ENCOUNTER — Ambulatory Visit: Payer: Medicare Other | Admitting: Physical Therapy

## 2013-04-17 ENCOUNTER — Ambulatory Visit: Payer: Medicare Other | Admitting: Physical Therapy

## 2013-04-19 ENCOUNTER — Ambulatory Visit: Payer: Medicare Other | Admitting: *Deleted

## 2013-04-22 ENCOUNTER — Ambulatory Visit: Payer: Medicare Other | Admitting: Physical Therapy

## 2013-04-24 ENCOUNTER — Ambulatory Visit: Payer: Medicare Other | Attending: Orthopedic Surgery | Admitting: Physical Therapy

## 2013-04-24 DIAGNOSIS — Z96659 Presence of unspecified artificial knee joint: Secondary | ICD-10-CM | POA: Insufficient documentation

## 2013-04-24 DIAGNOSIS — R5381 Other malaise: Secondary | ICD-10-CM | POA: Insufficient documentation

## 2013-04-24 DIAGNOSIS — IMO0001 Reserved for inherently not codable concepts without codable children: Secondary | ICD-10-CM | POA: Insufficient documentation

## 2013-04-24 DIAGNOSIS — R269 Unspecified abnormalities of gait and mobility: Secondary | ICD-10-CM | POA: Insufficient documentation

## 2013-04-24 DIAGNOSIS — M25569 Pain in unspecified knee: Secondary | ICD-10-CM | POA: Insufficient documentation

## 2013-04-26 ENCOUNTER — Ambulatory Visit: Payer: Medicare Other | Admitting: Physical Therapy

## 2013-04-29 ENCOUNTER — Ambulatory Visit: Payer: Medicare Other | Admitting: Physical Therapy

## 2013-05-01 ENCOUNTER — Ambulatory Visit: Payer: Medicare Other | Admitting: Physical Therapy

## 2013-05-03 ENCOUNTER — Ambulatory Visit: Payer: Medicare Other | Admitting: *Deleted

## 2013-05-06 ENCOUNTER — Ambulatory Visit: Payer: Medicare Other | Admitting: *Deleted

## 2013-05-13 ENCOUNTER — Encounter: Payer: Medicare Other | Admitting: Physical Therapy

## 2013-05-15 ENCOUNTER — Ambulatory Visit: Payer: Medicare Other | Admitting: Physical Therapy

## 2013-05-17 ENCOUNTER — Ambulatory Visit: Payer: Medicare Other | Admitting: Physical Therapy

## 2013-05-22 ENCOUNTER — Encounter: Payer: Medicare Other | Admitting: Physical Therapy

## 2013-05-23 ENCOUNTER — Encounter: Payer: Medicare Other | Admitting: *Deleted

## 2013-11-04 ENCOUNTER — Other Ambulatory Visit (HOSPITAL_COMMUNITY): Payer: Self-pay | Admitting: Orthopedic Surgery

## 2013-11-04 DIAGNOSIS — M25561 Pain in right knee: Secondary | ICD-10-CM

## 2013-11-07 ENCOUNTER — Encounter (HOSPITAL_COMMUNITY)
Admission: RE | Admit: 2013-11-07 | Discharge: 2013-11-07 | Disposition: A | Discharge status: Home / Self Care | Payer: Medicare Other | Source: Ambulatory Visit | Attending: Orthopedic Surgery | Admitting: Orthopedic Surgery

## 2013-11-07 ENCOUNTER — Encounter (HOSPITAL_COMMUNITY): Payer: Self-pay

## 2013-11-07 DIAGNOSIS — M25569 Pain in unspecified knee: Secondary | ICD-10-CM | POA: Insufficient documentation

## 2013-11-07 DIAGNOSIS — M25561 Pain in right knee: Secondary | ICD-10-CM

## 2013-11-07 MED ORDER — TECHNETIUM TC 99M MEDRONATE IV KIT: 25.0000 | PACK | Freq: Once | INTRAVENOUS | Status: AC | PRN

## 2013-11-20 ENCOUNTER — Encounter (INDEPENDENT_AMBULATORY_CARE_PROVIDER_SITE_OTHER): Payer: PRIVATE HEALTH INSURANCE | Admitting: Surgery

## 2013-11-27 ENCOUNTER — Ambulatory Visit (INDEPENDENT_AMBULATORY_CARE_PROVIDER_SITE_OTHER): Payer: Medicare Other | Admitting: Surgery

## 2013-11-27 VITALS — BP 132/86 | HR 80 | Temp 98.2°F | Resp 18 | Ht 64.5 in | Wt 289.6 lb

## 2013-11-27 DIAGNOSIS — Z6841 Body Mass Index (BMI) 40.0 and over, adult: Secondary | ICD-10-CM

## 2013-11-27 DIAGNOSIS — E119 Type 2 diabetes mellitus without complications: Secondary | ICD-10-CM

## 2013-11-27 DIAGNOSIS — K439 Ventral hernia without obstruction or gangrene: Secondary | ICD-10-CM

## 2013-11-27 NOTE — Progress Notes (Signed)
Chief Complaint:  Morbid obesity, diabetes mellitus and large ventral hernias.  History of Present Illness:  Andrea Norton is an 68 y.o. female referred by Dr. Lysbeth Galas who follows her closely medically both for diabetes in for her weight and who referred her for consideration of repair of her ventral herniae.  She underwent a 1 stage colectomy by Dr. Josephine Cables for diverticulitis several years ago and developed an epigastric and also a lower abdominal hernia. She's also had problems with knee issues and failed arthroplasties and this probably has to do with her weight as well. Her current BMI is 48.  I discussed sleeve gastrectomy with her and her husband. She is interested in following along and having such surgery. All were for her to the web and are. We'll give her some more information on this. I think to do a laparoscopic sleeve gastrectomy and possibly repair these hernias at the same time might be an option or delayed repair the hernias until she's lost weight. Certainly this would have an impact on her knees as well.  Past Medical History  Diagnosis Date  . Hyperlipidemia   . Status post primary angioplasty with coronary stent     DRUG-ELUTING STENT X1  TO PROXIMAL LAD  . Chronic back pain   . Chronic neck pain   . Arthritis     HANDS, KNEES  . DDD (degenerative disc disease), cervical   . DDD (degenerative disc disease), lumbar   . Radicular pain in left arm OCCASIONAL PAIN SHOOTING DOWN LEFT ARM SECAONDARY TO  CERVICAL DEGENERATION  . Hemorrhoid   . Diverticulitis of colon   . B12 deficiency anemia   . Vitamin D deficiency   . Hypothyroidism   . Synovitis of knee HYPERTROPHIC RIGHT KNEE  . Abnormal stress test 01-22-2009    LOW RISK ADENOSINE NUCLEAR STUDY W/ PROBABLE MILD APICAL THINNING BUT NO ISCHEMIA  . Depression     states well controlled  . Sleep apnea     couldnt tolerate machine- states last sleep study " many years ago"  . GERD (gastroesophageal reflux disease)    . Inguinal hernia, right     with ventral hernia  . Coronary artery disease 2010    STRESS TEST /, NOte Dr Kirtland Bouchard EPIC/ no cardiologist now--STATES HEART STENT PLACED 2010  . Hypertension       PCP Dr Lysbeth Galas    . Diabetes mellitus     ORAL MED - NO INSULIN  . Hemorrhoids     Past Surgical History  Procedure Laterality Date  . Knee closed reduction  05-07-2009  RIGHT KNEE    LEFT KNEE  10-01-2009  . Total knee arthroplasty  LEFT  2005    RIGHT  03-13-2009  . Total knee revision  RIGHT 09-30-2009    LEFT 11-25-2009  . Hysteroscopy w/d&c  2002  . Open patellofemoral right knee/ lateral release  05-04-2009  . Knee arthroscopy w/ debridement  01-22-2010    SEPTIC KNEE  . Right knee i & d polythylene revision  02-04-2010    KNEE INFECTED  . Left foot surg.  1990  . Appendectomy  1993  . Knee arthroscopy  LEFT 1999    RIGHT 2004  . Hemicolectomy for diverticulitis  2005  . Knee arthroscopy  08/03/2011    Procedure: ARTHROSCOPY KNEE;  Surgeon: Loanne Drilling;  Location: [facility];  Service: Orthopedics;  Laterality: Right;  . Synovectomy  08/03/2011    Procedure: SYNOVECTOMY;  Surgeon:  Gus RankinFrank V Aluisio;  Location: [facility];  Service: Orthopedics;  Laterality: Right;  . Joint replacement    . Coronary angioplasty  2004    DRUG-ELUTING STENT X1 TO PROXIMAL  LAD  . I&d knee with poly exchange  02/01/2012    Procedure: IRRIGATION AND DEBRIDEMENT KNEE WITH POLY EXCHANGE;  Surgeon: Loanne DrillingFrank V Aluisio, MD;  Location: [facility];  Service: Orthopedics;  Laterality: Right;  Right Knee Polyethlene Revision   . Colon surgery    . Total knee revision Right 02/20/2013    Procedure: RIGHT TOTAL KNEE ARTHROPLASTY REVISION VERSUS RESECTION ARTHROPLASTY;  Surgeon: Loanne DrillingFrank V Aluisio, MD;  Location: [facility];  Service: Orthopedics;  Laterality: Right;    Current Outpatient Prescriptions  Medication Sig Dispense Refill  . escitalopram (LEXAPRO) 10 MG tablet Take 10 mg by  mouth every evening.       . furosemide (LASIX) 20 MG tablet Take 20 mg by mouth daily with breakfast.       . gabapentin (NEURONTIN) 100 MG capsule Take 200 mg by mouth 2 (two) times daily.      Marland Kitchen. ketoconazole (NIZORAL) 2 % cream Apply 1 application topically daily.       Marland Kitchen. levothyroxine (SYNTHROID, LEVOTHROID) 125 MCG tablet Take 112 mcg by mouth daily before breakfast.       . metFORMIN (GLUCOPHAGE) 500 MG tablet Take 500 mg by mouth daily with breakfast.      . methocarbamol (ROBAXIN) 500 MG tablet Take 1 tablet (500 mg total) by mouth every 6 (six) hours as needed.  80 tablet  0  . metoprolol succinate (TOPROL-XL) 25 MG 24 hr tablet Take 25 mg by mouth daily with breakfast.       . oxyCODONE (OXY IR/ROXICODONE) 5 MG immediate release tablet Take 1-4 tablets (5-20 mg total) by mouth every 3 (three) hours as needed.  90 tablet  0  . pantoprazole (PROTONIX) 40 MG tablet Take 40 mg by mouth at bedtime.       . potassium chloride SA (K-DUR,KLOR-CON) 20 MEQ tablet Take 20 mEq by mouth daily with breakfast.       . rivaroxaban (XARELTO) 10 MG TABS tablet Take 1 tablet (10 mg total) by mouth daily with breakfast. Take Xarelto for two and a half more weeks, then discontinue Xarelto. Once the patient has completed the Xarelto, they may resume the 81 mg Aspirin.  18 tablet  0  . simvastatin (ZOCOR) 40 MG tablet Take 40 mg by mouth every morning.       . traMADol (ULTRAM) 50 MG tablet Take 1-2 tablets (50-100 mg total) by mouth every 6 (six) hours as needed (mild pain).  80 tablet  0   No current facility-administered medications for this visit.   Morphine and related Family History  Problem Relation Age of Onset  . COPD Mother   . Cancer Father     lung   Social History:   reports that she quit smoking about 11 years ago. Her smoking use included Cigarettes. She smoked 0.00 packs per day for 39 years. She has never used smokeless tobacco. She reports that she does not drink alcohol or use illicit  drugs.   REVIEW OF SYSTEMS - PERTINENT POSITIVES ONLY: See details above.  Physical Exam:   Blood pressure 132/86, pulse 80, temperature 98.2 F (36.8 C), temperature source Temporal, resp. rate 18, height 5' 4.5" (1.638 m), weight 289 lb 9.6 oz (131.362 kg). Body mass index is 48.96 kg/(m^2).  Gen:  WDWN white female NAD  Neurological: Alert and oriented to person, place, and time. Motor and sensory function is grossly intact  Head: Normocephalic and atraumatic.  Eyes: Conjunctivae are normal. Pupils are equal, round, and reactive to light. No scleral icterus.  Neck: Normal range of motion. Neck supple. No tracheal deviation or thyromegaly present.  Cardiovascular:  SR without murmurs or gallops.  No carotid bruits Respiratory: Effort normal.  No respiratory distress. No chest wall tenderness. Breath sounds normal.  No wheezes, rales or rhonchi.  Abdomen:  Midline incisions with large upper ventral hernia that is tender at times and a lower ventral incisional hernia which she says does not bother her. GU: Musculoskeletal: Normal range of motion. Extremities are nontender. No cyanosis, edema or clubbing noted Lymphadenopathy: No cervical, preauricular, postauricular or axillary adenopathy is present Skin: Skin is warm and dry. No rash noted. No diaphoresis. No erythema. No pallor. Pscyh: Normal mood and affect. Behavior is normal. Judgment and thought content normal.   LABORATORY RESULTS: No results found for this or any previous visit (from the past 48 hour(s)).  RADIOLOGY RESULTS: No results found.  Problem List: Patient Active Problem List   Diagnosis Date Noted  . Postoperative anemia due to acute blood loss 02/21/2013  . 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 DEGENERATIVE JOINT DISEASE 01/09/2009  . HYPOTHYROIDISM 01/06/2009  . DYSLIPIDEMIA 01/06/2009  . OBESITY, MORBID 01/06/2009  . HYPERTENSION 01/06/2009  . CAD  01/06/2009  . GASTROESOPHAGEAL REFLUX DISEASE 01/06/2009  . SLEEP APNEA 01/06/2009  . DYSPNEA 01/06/2009  . DIVERTICULITIS, HX OF 01/06/2009    Assessment & Plan: Morbid obesity, type 2 diabetes and ventral hernias. Plan sleeve gastrectomy and ventral hernia repair.    Matt B. Daphine DeutscherMartin, MD, [facility]  [facility] [phone] beeper [phone]  11/27/2013 10:44 AM

## 2013-11-27 NOTE — Patient Instructions (Signed)
Sleeve Gastrectomy A sleeve gastrectomy is a surgery in which a large portion of the stomach is removed. After the surgery, the stomach will be a narrow tube about the size of a banana. This surgery is performed to help a person lose weight. The person loses weight because the reduced size of the stomach restricts the amount of food that the person can eat. The stomach will hold much less food than before the surgery. Also, the part of the stomach that is removed produces a hormone that causes hunger.  This surgery is done for people who have morbid obesity, defined as a body mass index (BMI) greater than 40. BMI is an estimate of body fat and is calculated from the height and weight of a person. This surgery may also be done for people with a BMI between 35 and 40 if they have other diseases, such as type 2 diabetes mellitus, obstructive sleep apnea, or heart and lung disorders (cardiopulmonary diseases).  LET YOUR HEALTH CARE PROVIDER KNOW ABOUT:  Any allergies you have.   All medicines you are taking, including vitamins, herbs, eyedrops, creams, and over-the-counter medicines.   Use of steroids (by mouth or creams).   Previous problems you or members of your family have had with the use of anesthetics.   Any blood disorders you have.   Previous surgeries you have had.   Possibility of pregnancy, if this applies.   Other health problems you have. RISKS AND COMPLICATIONS Generally, sleeve gastrectomy is a safe procedure. However, as with any procedure, complications can occur. Possible complications include:  Infection.  Bleeding.  Blood clots.  Damage to other organs or tissue.  Leakage of fluid from the stomach into the abdominal cavity (rare). BEFORE THE PROCEDURE  You may need to have blood tests and imaging tests (such as X-rays or ultrasonography) done before the day of surgery. A test to evaluate your esophagus and how it moves (esophageal manometry) may also be  done.  You may be placed on a liquid diet 2 3 weeks before the surgery.  Ask your health care provider about changing or stopping your regular medicines.  Do not eat or drink anything for at least 8 hours before the procedure.   Make plans to have someone drive you home after your hospital stay. Also arrange for someone to help you with activities during recovery. PROCEDURE  A laparoscopic technique is usually used for this surgery:  You will be given medicine to make you sleep through the procedure (general anesthetic). This medicine will be given through an intravenous (IV) access tube that is put into one of your veins.  Once you are asleep, your abdomen will be cleaned and sterilized.  Several small incisions will be made in your abdomen.  Your abdomen will be filled with air so that it expands. This gives the surgeon more room to operate and makes your organs easier to see.  A thin, lighted tube with a tiny camera on the end (laparoscope) is put through a small incision in your abdomen. The camera on the laparoscope sends a picture to a TV screen in the operating room. This gives the surgeon a good view inside the abdomen.  Hollow tubes are put through the other small incisions in your abdomen. The tools needed for the procedure are put through these tubes.  The surgeon uses staples to divide part of the stomach and then removes it through one of the incisions.  The remaining stomach may be reinforced using   stitches or surgical glue or both to prevent leakage of the stomach contents. A small tube (drain) may be placed through one of the incisions to allow extra fluid to flow from the area.  The incisions are closed with stitches, staples, or glue. AFTER THE PROCEDURE  You will be monitored closely in a recovery area. Once the anesthetic has worn off, you will likely be moved to a regular hospital room.  You will be given medicine for pain and nausea.   You may have a drain  from one of the incisions in your abdomen. If a drain is used, it may stay in place after you go home from the hospital and be removed at a follow-up appointment.   You will be encouraged to walk around several times a day. This helps prevent blood clots.  You will be started on a liquid diet the first day after your surgery. Sometimes a test is done to check for leaking before you can eat.  You will be urged to cough and do deep breathing exercises. This helps prevent a lung infection after a surgery.  You will likely need to stay in the hospital for a few days.  Document Released: 05/08/2009 Document Revised: 03/13/2013 Document Reviewed: 11/23/2012 Exit[employer] Patient Information 2014 [employer].  

## 2013-12-28 ENCOUNTER — Encounter: Payer: Medicare Other | Attending: Surgery | Admitting: Dietician

## 2013-12-28 DIAGNOSIS — Z713 Dietary counseling and surveillance: Secondary | ICD-10-CM | POA: Insufficient documentation

## 2013-12-28 DIAGNOSIS — Z6841 Body Mass Index (BMI) 40.0 and over, adult: Secondary | ICD-10-CM | POA: Diagnosis not present

## 2013-12-28 DIAGNOSIS — E119 Type 2 diabetes mellitus without complications: Secondary | ICD-10-CM | POA: Diagnosis not present

## 2013-12-28 NOTE — Progress Notes (Signed)
  Pre-Op Assessment Visit:  Pre-Operative Gastric sleeve Surgery  Medical Nutrition Therapy:  Appt start time: 0900   End time:  0930.  Patient was seen on 12/28/2013 for Pre-Operative gastric sleeve Nutrition Assessment. Assessment and letter of approval faxed to [facility] Bariatric Surgery Program coordinator on 12/28/2013.   Preferred Learning Style:   No preference indicated   Learning Readiness:   Ready  Handouts given during visit include:  Pre-Op Goals Bariatric Surgery Protein Shakes  Teaching Method Utilized:  Visual Auditory Hands on  Barriers to learning/adherence to lifestyle change: physical disability  Demonstrated degree of understanding via:  Teach Back   Patient to call the [facility] to enroll in Pre-Op and Post-Op Nutrition Education when surgery date is scheduled.

## 2014-01-13 ENCOUNTER — Inpatient Hospital Stay (HOSPITAL_COMMUNITY): Admission: RE | Admit: 2014-01-13 | Payer: Medicare Other | Source: Ambulatory Visit

## 2014-01-13 ENCOUNTER — Ambulatory Visit (HOSPITAL_COMMUNITY): Payer: Medicare Other

## 2014-01-14 ENCOUNTER — Other Ambulatory Visit: Payer: Self-pay

## 2014-01-14 ENCOUNTER — Ambulatory Visit (HOSPITAL_COMMUNITY)
Admission: RE | Admit: 2014-01-14 | Discharge: 2014-01-14 | Disposition: A | Discharge status: Home / Self Care | Payer: Medicare Other | Source: Ambulatory Visit | Attending: Surgery | Admitting: Surgery

## 2014-01-14 DIAGNOSIS — I10 Essential (primary) hypertension: Secondary | ICD-10-CM | POA: Insufficient documentation

## 2014-01-14 DIAGNOSIS — F329 Major depressive disorder, single episode, unspecified: Secondary | ICD-10-CM | POA: Insufficient documentation

## 2014-01-14 DIAGNOSIS — IMO0002 Reserved for concepts with insufficient information to code with codable children: Secondary | ICD-10-CM | POA: Insufficient documentation

## 2014-01-14 DIAGNOSIS — K219 Gastro-esophageal reflux disease without esophagitis: Secondary | ICD-10-CM | POA: Insufficient documentation

## 2014-01-14 DIAGNOSIS — I2510 Atherosclerotic heart disease of native coronary artery without angina pectoris: Secondary | ICD-10-CM | POA: Insufficient documentation

## 2014-01-14 DIAGNOSIS — E039 Hypothyroidism, unspecified: Secondary | ICD-10-CM | POA: Insufficient documentation

## 2014-01-14 DIAGNOSIS — E119 Type 2 diabetes mellitus without complications: Secondary | ICD-10-CM

## 2014-01-14 DIAGNOSIS — Z6841 Body Mass Index (BMI) 40.0 and over, adult: Secondary | ICD-10-CM

## 2014-01-14 DIAGNOSIS — M542 Cervicalgia: Secondary | ICD-10-CM | POA: Insufficient documentation

## 2014-01-14 DIAGNOSIS — K649 Unspecified hemorrhoids: Secondary | ICD-10-CM | POA: Insufficient documentation

## 2014-01-14 DIAGNOSIS — G4730 Sleep apnea, unspecified: Secondary | ICD-10-CM | POA: Insufficient documentation

## 2014-01-14 DIAGNOSIS — K5732 Diverticulitis of large intestine without perforation or abscess without bleeding: Secondary | ICD-10-CM | POA: Insufficient documentation

## 2014-01-14 DIAGNOSIS — E559 Vitamin D deficiency, unspecified: Secondary | ICD-10-CM | POA: Insufficient documentation

## 2014-01-14 DIAGNOSIS — K449 Diaphragmatic hernia without obstruction or gangrene: Secondary | ICD-10-CM | POA: Insufficient documentation

## 2014-01-14 DIAGNOSIS — M549 Dorsalgia, unspecified: Secondary | ICD-10-CM | POA: Insufficient documentation

## 2014-01-14 DIAGNOSIS — K439 Ventral hernia without obstruction or gangrene: Secondary | ICD-10-CM | POA: Insufficient documentation

## 2014-01-14 DIAGNOSIS — E785 Hyperlipidemia, unspecified: Secondary | ICD-10-CM | POA: Insufficient documentation

## 2014-01-14 DIAGNOSIS — F3289 Other specified depressive episodes: Secondary | ICD-10-CM | POA: Insufficient documentation

## 2014-01-17 ENCOUNTER — Encounter: Payer: Medicare Other | Admitting: Dietician

## 2014-01-17 LAB — COMPREHENSIVE METABOLIC PANEL
ALBUMIN: 3.9 g/dL (ref 3.5–5.2)
ALT: 12 U/L (ref 0–35)
AST: 13 U/L (ref 0–37)
Alkaline Phosphatase: 80 U/L (ref 39–117)
BUN: 14 mg/dL (ref 6–23)
CALCIUM: 9.1 mg/dL (ref 8.4–10.5)
CHLORIDE: 103 meq/L (ref 96–112)
CO2: 22 meq/L (ref 19–32)
CREATININE: 0.95 mg/dL (ref 0.50–1.10)
Glucose, Bld: 104 mg/dL — ABNORMAL HIGH (ref 70–99)
POTASSIUM: 4.9 meq/L (ref 3.5–5.3)
Sodium: 140 mEq/L (ref 135–145)
Total Bilirubin: 1.2 mg/dL (ref 0.2–1.2)
Total Protein: 7.4 g/dL (ref 6.0–8.3)

## 2014-01-17 LAB — HEMOGLOBIN A1C
Hgb A1c MFr Bld: 5.9 % — ABNORMAL HIGH (ref <5.7)
MEAN PLASMA GLUCOSE: 123 mg/dL — AB (ref <117)

## 2014-01-17 LAB — CBC WITH DIFFERENTIAL/PLATELET
Basophils Absolute: 0.1 10*3/uL (ref 0.0–0.1)
Basophils Relative: 1 % (ref 0–1)
EOS PCT: 5 % (ref 0–5)
Eosinophils Absolute: 0.4 10*3/uL (ref 0.0–0.7)
HEMATOCRIT: 38.4 % (ref 36.0–46.0)
Hemoglobin: 12.7 g/dL (ref 12.0–15.0)
LYMPHS ABS: 1.9 10*3/uL (ref 0.7–4.0)
LYMPHS PCT: 24 % (ref 12–46)
MCH: 25.7 pg — ABNORMAL LOW (ref 26.0–34.0)
MCHC: 33.1 g/dL (ref 30.0–36.0)
MCV: 77.7 fL — AB (ref 78.0–100.0)
MONO ABS: 0.8 10*3/uL (ref 0.1–1.0)
Monocytes Relative: 10 % (ref 3–12)
NEUTROS ABS: 4.7 10*3/uL (ref 1.7–7.7)
Neutrophils Relative %: 60 % (ref 43–77)
PLATELETS: 334 10*3/uL (ref 150–400)
RBC: 4.94 MIL/uL (ref 3.87–5.11)
RDW: 16 % — ABNORMAL HIGH (ref 11.5–15.5)
WBC: 7.9 10*3/uL (ref 4.0–10.5)

## 2014-01-17 LAB — T4: T4, Total: 11.3 ug/dL (ref 5.0–12.5)

## 2014-01-17 LAB — TSH: TSH: 2.977 u[IU]/mL (ref 0.350–4.500)

## 2014-01-17 NOTE — Patient Instructions (Addendum)
-  Keep walking in the pool! -Eat plenty of vegetables

## 2014-01-17 NOTE — Progress Notes (Signed)
  Medical Nutrition Therapy:  Appt start time: 1030 end time:  1045.  SWL visit #1:  Primary concerns today:  Malachi BondsGloria is here today for her first SWL appointment in preparation for gastric sleeve surgery. She states that she has been "cutting back" on starchy foods like potatoes. Her husband does the cooking. Malachi BondsGloria reports that she has also been walking in the pool. She is working on chewing her foods really well and drinking plenty of water and water with [place] [employer]. She has not tried any protein shakes but has ordered supplements. Not checking blood sugars.  MEDICATIONS: see list   Recent physical activity: not able to do any physical activity; walks in the pool some  Estimated energy needs: 1600 calories  Progress Towards Goal(s):  In progress.   Nutritional Diagnosis:  [place]-3.3 Overweight/obesity related to past poor dietary habits and physical inactivity as evidenced by patient w/ pending gastric sleeve surgery following dietary guidelines for continued weight loss.     Intervention:  Nutrition counseling provided.  Handouts given during visit include:  Phase 3A lean protein foods   Monitoring/Evaluation:  Dietary intake, exercise, and body weight in 4 week(s).

## 2014-02-17 ENCOUNTER — Encounter: Payer: Medicare Other | Attending: Surgery | Admitting: Dietician

## 2014-02-17 DIAGNOSIS — E119 Type 2 diabetes mellitus without complications: Secondary | ICD-10-CM | POA: Insufficient documentation

## 2014-02-17 DIAGNOSIS — Z6841 Body Mass Index (BMI) 40.0 and over, adult: Secondary | ICD-10-CM | POA: Diagnosis not present

## 2014-02-17 DIAGNOSIS — Z713 Dietary counseling and surveillance: Secondary | ICD-10-CM | POA: Insufficient documentation

## 2014-02-17 NOTE — Patient Instructions (Signed)
-  Keep walking in the pool! -Eat plenty of vegetables 

## 2014-02-17 NOTE — Progress Notes (Signed)
  Medical Nutrition Therapy:  Appt start time: 1140 end time:  1155.  SWL visit #2:  Primary concerns today:  Malachi BondsGloria is here today for her second SWL appointment in preparation for gastric sleeve surgery. She is having knee surgery in September. Malachi BondsGloria states she has been walking in the pool and avoiding starches. She hasn't tried any supplements or protein shakes.   MEDICATIONS: see list  Recent physical activity: not able to do any physical activity; walks in the pool some  Estimated energy needs: 1600 calories  Progress Towards Goal(s):  In progress.   Nutritional Diagnosis:  [place]-3.3 Overweight/obesity related to past poor dietary habits and physical inactivity as evidenced by patient w/ pending gastric sleeve surgery following dietary guidelines for continued weight loss.     Intervention:  Nutrition counseling provided. Discussed 2 week post op liquid phase.  Handouts given during visit include:  Protein shakes  Monitoring/Evaluation:  Dietary intake, exercise, and body weight in 4 week(s).

## 2014-02-27 ENCOUNTER — Other Ambulatory Visit: Payer: Self-pay | Admitting: Orthopedic Surgery

## 2014-02-27 NOTE — Progress Notes (Signed)
Preoperative surgical orders have been place into the Epic hospital system for Andrea Norton on 02/27/2014, 9:56 AM  by Patrica DuelPERKINS, Lateshia Schmoker for surgery on 04/02/2014.  Preop Total Knee orders including Experal, IV Tylenol, and IV Decadron as long as there are no contraindications to the above medications. Andrea Peacerew Lathaniel Legate, PA-C

## 2014-03-20 ENCOUNTER — Encounter: Payer: Medicare Other | Attending: Surgery | Admitting: Dietician

## 2014-03-20 DIAGNOSIS — Z713 Dietary counseling and surveillance: Secondary | ICD-10-CM | POA: Diagnosis not present

## 2014-03-20 DIAGNOSIS — E119 Type 2 diabetes mellitus without complications: Secondary | ICD-10-CM | POA: Diagnosis not present

## 2014-03-20 DIAGNOSIS — Z6841 Body Mass Index (BMI) 40.0 and over, adult: Secondary | ICD-10-CM | POA: Diagnosis not present

## 2014-03-20 NOTE — Patient Instructions (Addendum)
-  Try some protein shakes

## 2014-03-20 NOTE — Progress Notes (Signed)
  Medical Nutrition Therapy:  Appt start time: 1200 end time:  1215.  SWL visit #3:  Primary concerns today:  Andrea Norton returns today for her 3rd SWL weight loss visit in preparation for gastric sleeve. She is scheduled to have knee surgery on September 9th. Hasn't been able to walk in the pool lately. She and her husband care for their 2 young great-grandchildren.     MEDICATIONS: see list  Recent physical activity: not able to do any physical activity; walks in the pool some  Estimated energy needs: 1600 calories  Progress Towards Goal(s):  In progress.   Nutritional Diagnosis:  -3.3 Overweight/obesity related to past poor dietary habits and physical inactivity as evidenced by patient w/ pending gastric sleeve surgery following dietary guidelines for continued weight loss.     Intervention:  Nutrition counseling provided. Discussed 2 week post op liquid phase.  Handouts given during visit include:  Protein shakes  Monitoring/Evaluation:  Dietary intake, exercise, and body weight in 4 week(s).

## 2014-03-24 ENCOUNTER — Encounter (HOSPITAL_COMMUNITY): Payer: Self-pay | Admitting: Pharmacy Technician

## 2014-03-26 NOTE — Patient Instructions (Addendum)
22 Andrea Norton  03/26/2014   Your procedure is scheduled on: 04/02/2014  Report to [facility] Main Entrance and follow signs to  Short Stay Center at 11:15 AM.  Call this number if you have problems the morning of surgery [phone]   Remember:  Do not eat food after midnight. May have clear liquids until 06:30 am,then nothing by mouth. Eat a healthy snack prior to bedtime,night before surgery.    Take these medicines the morning of surgery with A SIP OF WATER:  Synthroid, Toprol, gabapentin                               You may not have any metal on your body including hair pins and piercings  Do not wear jewelry, make-up, lotions, powders, or deodorant.  Do not bring valuables to the hospital. [facility] IS NOT RESPONSIBLE FOR VALUABLES.  Contacts, dentures or bridgework may not be worn into surgery.  Leave suitcase in the car. After surgery it may be brought to your room.  For patients admitted to the hospital, checkout time is 11:00 AM the day of discharge.    Special Instructions: coughing and deep breathing exercises. ________________________________________________________________________     CLEAR LIQUID DIET   Foods Allowed                                                                     Foods Excluded  Coffee and tea, regular and decaf                             liquids that you cannot  Plain Jell-O in any flavor                                             see through such as: Fruit ices (not with fruit pulp)                                     milk, soups, orange juice  Iced Popsicles                                    All solid food Carbonated beverages, regular and diet                                    Cranberry, grape and apple juices Sports drinks like Gatorade Lightly seasoned clear broth or consume(fat free) Sugar, honey syrup  Sample Menu Breakfast                                Lunch  Supper Cranberry  juice                    Beef broth                            Chicken broth Jell-O                                     Grape juice                           Apple juice Coffee or tea                        Jell-O                                      Popsicle                                                Coffee or tea                        Coffee or tea  _____________________________________________________________________    Incentive Spirometer  An incentive spirometer is a tool that can help keep your lungs clear and active. This tool measures how well you are filling your lungs with each breath. Taking long deep breaths may help reverse or decrease the chance of developing breathing (pulmonary) problems (especially infection) following:  A long period of time when you are unable to move or be active. BEFORE THE PROCEDURE   If the spirometer includes an indicator to show your best effort, your nurse or respiratory therapist will set it to a desired goal.  If possible, sit up straight or lean slightly forward. Try not to slouch.  Hold the incentive spirometer in an upright position. INSTRUCTIONS FOR USE  1. Sit on the edge of your bed if possible, or sit up as far as you can in bed or on a chair. 2. Hold the incentive spirometer in an upright position. 3. Breathe out normally. 4. Place the mouthpiece in your mouth and seal your lips tightly around it. 5. Breathe in slowly and as deeply as possible, raising the piston or the ball toward the top of the column. 6. Hold your breath for 3-5 seconds or for as long as possible. Allow the piston or ball to fall to the bottom of the column. 7. Remove the mouthpiece from your mouth and breathe out normally. 8. Rest for a few seconds and repeat Steps 1 through 7 at least 10 times every 1-2 hours when you are awake. Take your time and take a few normal breaths between deep breaths. 9. The spirometer may include an indicator to show your best  effort. Use the indicator as a goal to work toward during each repetition. 10. After each set of 10 deep breaths, practice coughing to be sure your lungs are clear. If you have an incision (the cut made at the time of surgery), support your incision when coughing by placing a pillow or rolled up towels firmly  against it. Once you are able to get out of bed, walk around indoors and cough well. You may stop using the incentive spirometer when instructed by your caregiver.  RISKS AND COMPLICATIONS  Take your time so you do not get dizzy or light-headed.  If you are in pain, you may need to take or ask for pain medication before doing incentive spirometry. It is harder to take a deep breath if you are having pain. AFTER USE  Rest and breathe slowly and easily.  It can be helpful to keep track of a log of your progress. Your caregiver can provide you with a simple table to help with this. If you are using the spirometer at home, follow these instructions: SEEK MEDICAL CARE IF:   You are having difficultly using the spirometer.  You have trouble using the spirometer as often as instructed.  Your pain medication is not giving enough relief while using the spirometer.  You develop fever of 100.5 F (38.1 C) or higher. SEEK IMMEDIATE MEDICAL CARE IF:   You cough up bloody sputum that had not been present before.  You develop fever of 102 F (38.9 C) or greater.  You develop worsening pain at or near the incision site. MAKE SURE YOU:   Understand these instructions.  Will watch your condition.  Will get help right away if you are not doing well or get worse. Document Released: 11/21/2006 Document Revised: 10/03/2011 Document Reviewed: 01/22/2007 ExitCare Patient Information 2014 ExitCare, [place].   ________________________________________________________________________  WHAT IS A BLOOD TRANSFUSION? Blood Transfusion Information  A transfusion is the replacement of blood or some  of its parts. Blood is made up of multiple cells which provide different functions.  Red blood cells carry oxygen and are used for blood loss replacement.  White blood cells fight against infection.  Platelets control bleeding.  Plasma helps clot blood.  Other blood products are available for specialized needs, such as hemophilia or other clotting disorders. BEFORE THE TRANSFUSION  Who gives blood for transfusions?   Healthy volunteers who are fully evaluated to make sure their blood is safe. This is blood bank blood. Transfusion therapy is the safest it has ever been in the practice of medicine. Before blood is taken from a donor, a complete history is taken to make sure that person has no history of diseases nor engages in risky social behavior (examples are intravenous drug use or sexual activity with multiple partners). The donor's travel history is screened to minimize risk of transmitting infections, such as malaria. The donated blood is tested for signs of infectious diseases, such as HIV and hepatitis. The blood is then tested to be sure it is compatible with you in order to minimize the chance of a transfusion reaction. If you or a relative donates blood, this is often done in anticipation of surgery and is not appropriate for emergency situations. It takes many days to process the donated blood. RISKS AND COMPLICATIONS Although transfusion therapy is very safe and saves many lives, the main dangers of transfusion include:   Getting an infectious disease.  Developing a transfusion reaction. This is an allergic reaction to something in the blood you were given. Every precaution is taken to prevent this. The decision to have a blood transfusion has been considered carefully by your caregiver before blood is given. Blood is not given unless the benefits outweigh the risks. AFTER THE TRANSFUSION  Right after receiving a blood transfusion, you will usually feel much better and more  energetic. This is especially true if your red blood cells have gotten low (anemic). The transfusion raises the level of the red blood cells which carry oxygen, and this usually causes an energy increase.  The nurse administering the transfusion will monitor you carefully for complications. HOME CARE INSTRUCTIONS  No special instructions are needed after a transfusion. You may find your energy is better. Speak with your caregiver about any limitations on activity for underlying diseases you may have. SEEK MEDICAL CARE IF:   Your condition is not improving after your transfusion.  You develop redness or irritation at the intravenous (IV) site. SEEK IMMEDIATE MEDICAL CARE IF:  Any of the following symptoms occur over the next 12 hours:  Shaking chills.  You have a temperature by mouth above 102 F (38.9 C), not controlled by medicine.  Chest, back, or muscle pain.  People around you feel you are not acting correctly or are confused.  Shortness of breath or difficulty breathing.  Dizziness and fainting.  You get a rash or develop hives.  You have a decrease in urine output.  Your urine turns a dark color or changes to pink, red, or brown. Any of the following symptoms occur over the next 10 days:  You have a temperature by mouth above 102 F (38.9 C), not controlled by medicine.  Shortness of breath.  Weakness after normal activity.  The white part of the eye turns yellow (jaundice).  You have a decrease in the amount of urine or are urinating less often.  Your urine turns a dark color or changes to pink, red, or brown. Document Released: 07/08/2000 Document Revised: 10/03/2011 Document Reviewed: 02/25/2008 ExitCare Patient Information 2014 [place].  _______________________________________________________________________[facility] - Preparing for Surgery Before surgery, you can play an important role.  Because skin is not sterile, your skin needs to be as free  of germs as possible.  You can reduce the number of germs on your skin by washing with CHG (chlorahexidine gluconate) soap before surgery.  CHG is an antiseptic cleaner which kills germs and bonds with the skin to continue killing germs even after washing. Please DO NOT use if you have an allergy to CHG or antibacterial soaps.  If your skin becomes reddened/irritated stop using the CHG and inform your nurse when you arrive at Short Stay. Do not shave (including legs and underarms) for at least 48 hours prior to the first CHG shower.  You may shave your face/neck. Please follow these instructions carefully:  1.  Shower with CHG Soap the night before surgery and the  morning of Surgery.  2.  If you choose to wash your hair, wash your hair first as usual with your  normal  shampoo.  3.  After you shampoo, rinse your hair and body thoroughly to remove the  shampoo.                           4.  Use CHG as you would any other liquid soap.  You can apply chg directly  to the skin and wash                       Gently with a scrungie or clean washcloth.  5.  Apply the CHG Soap to your body ONLY FROM THE NECK DOWN.   Do not use on face/ open  Wound or open sores. Avoid contact with eyes, ears mouth and genitals (private parts).                       Wash face,  Genitals (private parts) with your normal soap.             6.  Wash thoroughly, paying special attention to the area where your surgery  will be performed.  7.  Thoroughly rinse your body with warm water from the neck down.  8.  DO NOT shower/wash with your normal soap after using and rinsing off  the CHG Soap.                9.  Pat yourself dry with a clean towel.            10.  Wear clean pajamas.            11.  Place clean sheets on your bed the night of your first shower and do not  sleep with pets. Day of Surgery : Do not apply any lotions/deodorants the morning of surgery.  Please wear clean clothes to the  hospital/surgery center.  FAILURE TO FOLLOW THESE INSTRUCTIONS MAY RESULT IN THE CANCELLATION OF YOUR SURGERY PATIENT SIGNATURE_________________________________  NURSE SIGNATURE__________________________________  ________________________________________________________________________

## 2014-03-27 ENCOUNTER — Encounter (HOSPITAL_COMMUNITY)
Admission: RE | Admit: 2014-03-27 | Discharge: 2014-03-27 | Disposition: A | Discharge status: Home / Self Care | Payer: Medicare Other | Source: Ambulatory Visit | Attending: Orthopedic Surgery | Admitting: Orthopedic Surgery

## 2014-03-27 ENCOUNTER — Encounter (HOSPITAL_COMMUNITY): Payer: Self-pay

## 2014-03-27 DIAGNOSIS — M25569 Pain in unspecified knee: Secondary | ICD-10-CM | POA: Insufficient documentation

## 2014-03-27 DIAGNOSIS — Z01818 Encounter for other preprocedural examination: Secondary | ICD-10-CM | POA: Diagnosis present

## 2014-03-27 DIAGNOSIS — T8489XA Other specified complication of internal orthopedic prosthetic devices, implants and grafts, initial encounter: Secondary | ICD-10-CM | POA: Diagnosis not present

## 2014-03-27 DIAGNOSIS — Z96659 Presence of unspecified artificial knee joint: Secondary | ICD-10-CM | POA: Diagnosis not present

## 2014-03-27 HISTORY — DX: Personal history of other diseases of the digestive system: Z87.19

## 2014-03-27 HISTORY — DX: Bronchitis, not specified as acute or chronic: J40

## 2014-03-27 LAB — URINALYSIS, ROUTINE W REFLEX MICROSCOPIC
Bilirubin Urine: NEGATIVE
Glucose, UA: NEGATIVE mg/dL
Hgb urine dipstick: NEGATIVE
Ketones, ur: NEGATIVE mg/dL
NITRITE: NEGATIVE
PROTEIN: NEGATIVE mg/dL
SPECIFIC GRAVITY, URINE: 1.02 (ref 1.005–1.030)
UROBILINOGEN UA: 1 mg/dL (ref 0.0–1.0)
pH: 6 (ref 5.0–8.0)

## 2014-03-27 LAB — COMPREHENSIVE METABOLIC PANEL
ALT: 17 U/L (ref 0–35)
ANION GAP: 13 (ref 5–15)
AST: 20 U/L (ref 0–37)
Albumin: 3.3 g/dL — ABNORMAL LOW (ref 3.5–5.2)
Alkaline Phosphatase: 84 U/L (ref 39–117)
BILIRUBIN TOTAL: 0.6 mg/dL (ref 0.3–1.2)
BUN: 11 mg/dL (ref 6–23)
CHLORIDE: 99 meq/L (ref 96–112)
CO2: 25 mEq/L (ref 19–32)
Calcium: 9.3 mg/dL (ref 8.4–10.5)
Creatinine, Ser: 0.87 mg/dL (ref 0.50–1.10)
GFR calc Af Amer: 79 mL/min — ABNORMAL LOW (ref >90)
GFR calc non Af Amer: 68 mL/min — ABNORMAL LOW (ref >90)
Glucose, Bld: 99 mg/dL (ref 70–99)
POTASSIUM: 4.3 meq/L (ref 3.7–5.3)
Sodium: 137 mEq/L (ref 137–147)
Total Protein: 7.7 g/dL (ref 6.0–8.3)

## 2014-03-27 LAB — CBC
HEMATOCRIT: 39.7 % (ref 36.0–46.0)
Hemoglobin: 12.7 g/dL (ref 12.0–15.0)
MCH: 26.7 pg (ref 26.0–34.0)
MCHC: 32 g/dL (ref 30.0–36.0)
MCV: 83.4 fL (ref 78.0–100.0)
Platelets: 317 10*3/uL (ref 150–400)
RBC: 4.76 MIL/uL (ref 3.87–5.11)
RDW: 15.5 % (ref 11.5–15.5)
WBC: 7.7 10*3/uL (ref 4.0–10.5)

## 2014-03-27 LAB — SURGICAL PCR SCREEN
MRSA, PCR: INVALID — AB
STAPHYLOCOCCUS AUREUS: INVALID — AB

## 2014-03-27 LAB — APTT: APTT: 33 s (ref 24–37)

## 2014-03-27 LAB — PROTIME-INR
INR: 0.95 (ref 0.00–1.49)
Prothrombin Time: 12.7 seconds (ref 11.6–15.2)

## 2014-03-27 LAB — URINE MICROSCOPIC-ADD ON

## 2014-03-27 NOTE — Progress Notes (Signed)
LOV note from Dr Lysbeth Galas 01/15/14 on chart

## 2014-03-27 NOTE — Progress Notes (Signed)
Patient has appt with Dr Lysbeth Galas on 04/01/2014.  Andrea Norton is aware.

## 2014-03-27 NOTE — Progress Notes (Signed)
Called [name] and informed her that patient had a coronary stent in 2004.  Patient has not been seen by a cardiologist per [facility] and per patient since ? 2010 or before.  Requested LOV note from PCP ( Dr Lysbeth Galas to be faxed).  Requested clearance from [facility] if has one.

## 2014-03-27 NOTE — Progress Notes (Signed)
[name] called back and stated clearance had been sent to Dr Lysbeth Galas ( PCP) but has not been faxed to them yet.  I told her I had requested LOV note from Dr Lysbeth Galas and will place on chart.  Office stated LOV was 01/15/14 from Dr Lysbeth Galas.

## 2014-03-28 NOTE — Progress Notes (Signed)
Fax received and placed on pt chart cipro 500 mg 1 tab bid x 3 days called in for patient by dr Lequita Halt

## 2014-03-30 LAB — MRSA CULTURE

## 2014-04-01 ENCOUNTER — Other Ambulatory Visit: Payer: Self-pay | Admitting: Surgical

## 2014-04-01 MED ORDER — TRANEXAMIC ACID 100 MG/ML IV SOLN: 2000.0000 mg | Freq: Once | INTRAVENOUS | Status: AC

## 2014-04-01 MED ORDER — DEXTROSE 5 % IV SOLN
3.0000 g | INTRAVENOUS | Status: AC
  Administered 2014-04-02: 3 g via INTRAVENOUS
  Filled 2014-04-01: qty 3000

## 2014-04-01 NOTE — H&P (Signed)
TOTAL KNEE REVISION ADMISSION H&P  Patient is being admitted for right revision total knee arthroplasty.  Subjective:  Chief Complaint:right knee pain.  HPI: Andrea Norton, 68 y.o. female, has a history of pain and functional disability in the right knee(s) due to arthritis and failed previous arthroplasty and patient has failed non-surgical conservative treatments for greater than 12 weeks to include NSAID's and/or analgesics, flexibility and strengthening excercises, supervised PT with diminished ADL's post treatment, use of assistive devices, weight reduction as appropriate and activity modification. The indications for the revision of the total knee arthroplasty are fracture or mechanical failure of one or components. Onset of symptoms was gradual starting 5 years ago with gradually worsening course since that time.  Prior procedures on the right knee(s) include arthroplasty and revision.  Patient currently rates pain in the right knee(s) at 7 out of 10 with activity. There is night pain, worsening of pain with activity and weight bearing, pain that interferes with activities of daily living, pain with passive range of motion and joint swelling.  AP and lateral show the revision prosthesis in excellent position with no periprosthetic abnormality with no signs of loosening on bone scan. This condition presents safety issues increasing the risk of falls.  There is no current active infection.  Patient Active Problem List   Diagnosis Date Noted  . Postoperative anemia due to acute blood loss 02/21/2013  . 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 DEGENERATIVE JOINT DISEASE 01/09/2009  . HYPOTHYROIDISM 01/06/2009  . DYSLIPIDEMIA 01/06/2009  . OBESITY, MORBID 01/06/2009  . HYPERTENSION 01/06/2009  . CAD 01/06/2009  . GASTROESOPHAGEAL REFLUX DISEASE 01/06/2009  . SLEEP APNEA 01/06/2009  . DYSPNEA 01/06/2009  . DIVERTICULITIS, HX OF 01/06/2009    Past Medical History  Diagnosis Date  . Hyperlipidemia   . Status post primary angioplasty with coronary stent     DRUG-ELUTING STENT X1  TO PROXIMAL LAD  . Chronic back pain   . Chronic neck pain   . Arthritis     HANDS, KNEES  . DDD (degenerative disc disease), cervical   . DDD (degenerative disc disease), lumbar   . Radicular pain in left arm OCCASIONAL PAIN SHOOTING DOWN LEFT ARM SECAONDARY TO  CERVICAL DEGENERATION  . Hemorrhoid   . Diverticulitis of colon   . B12 deficiency anemia   . Vitamin D deficiency   . Hypothyroidism   . Synovitis of knee HYPERTROPHIC RIGHT KNEE  . Abnormal stress test 01-22-2009    LOW RISK ADENOSINE NUCLEAR STUDY W/ PROBABLE MILD APICAL THINNING BUT NO ISCHEMIA  . Depression     states well controlled  . GERD (gastroesophageal reflux disease)   . Inguinal hernia, right     with ventral hernia  . Hypertension       PCP Dr Lysbeth Galas    . Diabetes mellitus     ORAL MED - NO INSULIN  . Hemorrhoids   . Coronary artery disease 2005    STRESS TEST /, NOte Dr Kirtland Bouchard EPIC/ no cardiologist now--STATES HEART STENT PLACED 2010  . Sleep apnea     couldnt tolerate machine- states last sleep study " many years ago"  . Bronchitis     hx of   . H/O hiatal hernia     Past Surgical History  Procedure Laterality Date  . Knee closed reduction  05-07-2009  RIGHT KNEE    LEFT KNEE  10-01-2009  . Total knee arthroplasty  LEFT  2005  RIGHT  03-13-2009  . Total knee revision  RIGHT 09-30-2009    LEFT 11-25-2009  . Hysteroscopy w/d&c  2002  . Open patellofemoral right knee/ lateral release  05-04-2009  . Knee arthroscopy w/ debridement  01-22-2010    SEPTIC KNEE  . Right knee i & d polythylene revision  02-04-2010    KNEE INFECTED  . Left foot surg.  1990  . Appendectomy  1993  . Knee arthroscopy  LEFT 1999    RIGHT 2004  . Hemicolectomy for diverticulitis  2005  . Knee arthroscopy  08/03/2011    Procedure: ARTHROSCOPY KNEE;  Surgeon: Loanne Drilling;   Location: [facility];  Service: Orthopedics;  Laterality: Right;  . Synovectomy  08/03/2011    Procedure: SYNOVECTOMY;  Surgeon: Loanne Drilling;  Location: [facility];  Service: Orthopedics;  Laterality: Right;  . Coronary angioplasty  2004    DRUG-ELUTING STENT X1 TO PROXIMAL  LAD  . I&d knee with poly exchange  02/01/2012    Procedure: IRRIGATION AND DEBRIDEMENT KNEE WITH POLY EXCHANGE;  Surgeon: Loanne Drilling, MD;  Location: [facility];  Service: Orthopedics;  Laterality: Right;  Right Knee Polyethlene Revision   . Colon surgery    . Total knee revision Right 02/20/2013    Procedure: RIGHT TOTAL KNEE ARTHROPLASTY REVISION VERSUS RESECTION ARTHROPLASTY;  Surgeon: Loanne Drilling, MD;  Location: [facility];  Service: Orthopedics;  Laterality: Right;  . Joint replacement      right knee      Current outpatient prescriptions: aspirin EC 81 MG tablet, Take 81 mg by mouth daily. Patient takes at nite, Disp: , Rfl: ;   cyanocobalamin (,VITAMIN B-12,) 1000 MCG/ML injection, Inject 1,000 mcg into the muscle once. Husband gives it to her every 30 days per patient.  B12 injection will be due on 04/02/2014.,  escitalopram (LEXAPRO) 10 MG tablet, Take 10 mg by mouth every evening. , Disp: , Rfl:  furosemide (LASIX) 20 MG tablet, Take 20 mg by mouth at bedtime. , Disp: , Rfl: ;   gabapentin (NEURONTIN) 100 MG capsule, Take 300 mg by mouth 2 (two) times daily. , Disp: , Rfl: ;   levothyroxine (SYNTHROID, LEVOTHROID) 125 MCG tablet, Take 125 mcg by mouth daily before breakfast. , Disp: , Rfl: ;   lisinopril (PRINIVIL,ZESTRIL) 10 MG tablet, Take 10 mg by mouth daily with lunch., Disp: , Rfl:  metFORMIN (GLUMETZA) 500 MG (MOD) 24 hr tablet, Take 500 mg by mouth daily with breakfast., Disp: , Rfl: ;   metoprolol succinate (TOPROL-XL) 25 MG 24 hr tablet, Take 25 mg by mouth daily with lunch. , Disp: , Rfl: ;   pantoprazole (PROTONIX) 40 MG tablet, Take 40 mg by mouth at bedtime. , Disp:  , Rfl: ;   potassium chloride SA (K-DUR,KLOR-CON) 20 MEQ tablet, Take 20 mEq by mouth daily with breakfast. , Disp: , Rfl:  simvastatin (ZOCOR) 40 MG tablet, Take 40 mg by mouth every morning. , Disp: , Rfl: ;   Vitamin D, Ergocalciferol, (DRISDOL) 50000 UNITS CAPS capsule, Take 50,000 Units by mouth every 7 (seven) days., Disp: , Rfl:   Allergies  Allergen Reactions  . Morphine And Related     NOT ALLERGIC BUT IT DOES NOT HELP PAIN    History  Substance Use Topics  . Smoking status: Former Smoker -- 39 years    Types: Cigarettes    Quit date: 07/31/2002  . Smokeless tobacco: Never Used  . Alcohol Use:  No    Family History  Problem Relation Age of Onset  . COPD Mother   . Cancer Father     lung      Review of Systems  Constitutional: Negative for fever, chills, weight loss, malaise/fatigue and diaphoresis.  HENT: Negative for congestion, ear discharge, ear pain, hearing loss, nosebleeds, sore throat and tinnitus.   Eyes: Positive for blurred vision. Negative for double vision, photophobia, pain and redness.  Respiratory: Positive for shortness of breath and wheezing. Negative for cough, hemoptysis, sputum production and stridor.        SOB on exertion  Cardiovascular: Negative.   Gastrointestinal: Positive for abdominal pain. Negative for heartburn, nausea, vomiting, diarrhea, constipation, blood in stool and melena.  Genitourinary: Positive for frequency. Negative for dysuria, urgency, hematuria and flank pain.  Musculoskeletal: Positive for back pain, joint pain and myalgias. Negative for falls and neck pain.       Right knee pain  Skin: Negative.   Neurological: Positive for dizziness, weakness and headaches. Negative for tingling, tremors, sensory change, speech change, focal weakness, seizures and loss of consciousness.  Endo/Heme/Allergies: Negative.   Psychiatric/Behavioral: Negative.      Objective:  Physical Exam  Constitutional: She is oriented to person, place,  and time. She appears well-developed. No distress.  Morbidly obese  HENT:  Head: Normocephalic and atraumatic.  Right Ear: External ear normal.  Left Ear: External ear normal.  Nose: Nose normal.  Mouth/Throat: Oropharynx is clear and moist.  Eyes: Conjunctivae and EOM are normal.  Neck: Normal range of motion.  Cardiovascular: Normal rate, regular rhythm, normal heart sounds and intact distal pulses.   No murmur heard. Respiratory: Effort normal. No respiratory distress. She has decreased breath sounds. She has no wheezes.  GI: Soft. Bowel sounds are normal. She exhibits no distension. There is no tenderness.  Musculoskeletal:       Right hip: Normal.       Left hip: Normal.       Right knee: She exhibits decreased range of motion, swelling and effusion. She exhibits no erythema. Tenderness found. Medial joint line and lateral joint line tenderness noted.       Left knee: Normal.       Right lower leg: She exhibits no tenderness and no swelling.       Left lower leg: She exhibits no tenderness and no swelling.  She does have a small effusion in that knee. She is not significantly tender about the knee. Range of motion is about 20-125 degrees with no instability noted.  Neurological: She is alert and oriented to person, place, and time. She has normal strength and normal reflexes. No sensory deficit.  Skin: No rash noted. She is not diaphoretic. No erythema.  Psychiatric: She has a normal mood and affect. Her behavior is normal.   Vitals Weight: 289 lb Height: 60in Body Surface Area: 2.36 m Body Mass Index: 56.44 kg/m Pulse: 76 (Regular)  BP: 116/68 (Sitting, Left Arm, Standard)  Imaging Review Plain radiographs demonstrate mild degenerative joint disease of the right knee(s). The overall alignment is neutral.There is no evidence of loosening of the femoral, tibial and patellar components. The bone quality appears to be good for age and reported activity level.   Assessment/Plan:  End stage arthritis, right knee(s) with failed previous arthroplasty.   The patient history, physical examination, clinical judgment of the provider and imaging studies are consistent with end stage degenerative joint disease of the right knee(s), previous total knee arthroplasty.  Revision total knee arthroplasty is deemed medically necessary. The treatment options including medical management, injection therapy, arthroscopy and revision arthroplasty were discussed at length. The risks and benefits of revision total knee arthroplasty were presented and reviewed. The risks due to aseptic loosening, infection, stiffness, patella tracking problems, thromboembolic complications and other imponderables were discussed. The patient acknowledged the explanation, agreed to proceed with the plan and consent was signed. Patient is being admitted for inpatient treatment for surgery, pain control, PT, OT, prophylactic antibiotics, VTE prophylaxis, progressive ambulation and ADL's and discharge planning.The patient is planning to be discharged home with home health services   Topical TXA only  PCP: Dr. Brayton Caves, PA-C

## 2014-04-01 NOTE — Progress Notes (Signed)
No clearance note received from dr Lequita Halt office for 04-02-14 surgery.

## 2014-04-02 ENCOUNTER — Encounter (HOSPITAL_COMMUNITY): Payer: Self-pay | Admitting: *Deleted

## 2014-04-02 ENCOUNTER — Encounter (HOSPITAL_COMMUNITY): Payer: Medicare Other | Admitting: Certified Registered Nurse Anesthetist

## 2014-04-02 ENCOUNTER — Encounter (HOSPITAL_COMMUNITY)
Admission: RE | Disposition: A | Discharge status: Home Health | Payer: Self-pay | Source: Ambulatory Visit | Attending: Orthopedic Surgery

## 2014-04-02 ENCOUNTER — Inpatient Hospital Stay (HOSPITAL_COMMUNITY): Payer: Medicare Other | Admitting: Certified Registered Nurse Anesthetist

## 2014-04-02 ENCOUNTER — Inpatient Hospital Stay (HOSPITAL_COMMUNITY)
Admission: RE | Admit: 2014-04-02 | Discharge: 2014-04-04 | DRG: 467 | Disposition: A | Discharge status: Home Health | Payer: Medicare Other | Source: Ambulatory Visit | Attending: Orthopedic Surgery | Admitting: Orthopedic Surgery

## 2014-04-02 DIAGNOSIS — E119 Type 2 diabetes mellitus without complications: Secondary | ICD-10-CM | POA: Diagnosis present

## 2014-04-02 DIAGNOSIS — Z9861 Coronary angioplasty status: Secondary | ICD-10-CM

## 2014-04-02 DIAGNOSIS — Y831 Surgical operation with implant of artificial internal device as the cause of abnormal reaction of the patient, or of later complication, without mention of misadventure at the time of the procedure: Secondary | ICD-10-CM | POA: Diagnosis present

## 2014-04-02 DIAGNOSIS — Z836 Family history of other diseases of the respiratory system: Secondary | ICD-10-CM

## 2014-04-02 DIAGNOSIS — Z801 Family history of malignant neoplasm of trachea, bronchus and lung: Secondary | ICD-10-CM

## 2014-04-02 DIAGNOSIS — M24569 Contracture, unspecified knee: Secondary | ICD-10-CM | POA: Diagnosis present

## 2014-04-02 DIAGNOSIS — Z96651 Presence of right artificial knee joint: Secondary | ICD-10-CM

## 2014-04-02 DIAGNOSIS — E559 Vitamin D deficiency, unspecified: Secondary | ICD-10-CM | POA: Diagnosis present

## 2014-04-02 DIAGNOSIS — I10 Essential (primary) hypertension: Secondary | ICD-10-CM | POA: Diagnosis present

## 2014-04-02 DIAGNOSIS — E785 Hyperlipidemia, unspecified: Secondary | ICD-10-CM | POA: Diagnosis present

## 2014-04-02 DIAGNOSIS — Z79899 Other long term (current) drug therapy: Secondary | ICD-10-CM | POA: Diagnosis not present

## 2014-04-02 DIAGNOSIS — G8929 Other chronic pain: Secondary | ICD-10-CM | POA: Diagnosis present

## 2014-04-02 DIAGNOSIS — M1710 Unilateral primary osteoarthritis, unspecified knee: Secondary | ICD-10-CM | POA: Diagnosis present

## 2014-04-02 DIAGNOSIS — T84099A Other mechanical complication of unspecified internal joint prosthesis, initial encounter: Principal | ICD-10-CM | POA: Diagnosis present

## 2014-04-02 DIAGNOSIS — M542 Cervicalgia: Secondary | ICD-10-CM | POA: Diagnosis present

## 2014-04-02 DIAGNOSIS — K219 Gastro-esophageal reflux disease without esophagitis: Secondary | ICD-10-CM | POA: Diagnosis present

## 2014-04-02 DIAGNOSIS — Z87891 Personal history of nicotine dependence: Secondary | ICD-10-CM

## 2014-04-02 DIAGNOSIS — M549 Dorsalgia, unspecified: Secondary | ICD-10-CM | POA: Diagnosis present

## 2014-04-02 DIAGNOSIS — G4730 Sleep apnea, unspecified: Secondary | ICD-10-CM | POA: Diagnosis present

## 2014-04-02 DIAGNOSIS — Z885 Allergy status to narcotic agent status: Secondary | ICD-10-CM

## 2014-04-02 DIAGNOSIS — D518 Other vitamin B12 deficiency anemias: Secondary | ICD-10-CM | POA: Diagnosis present

## 2014-04-02 DIAGNOSIS — T84018A Broken internal joint prosthesis, other site, initial encounter: Secondary | ICD-10-CM

## 2014-04-02 DIAGNOSIS — M25569 Pain in unspecified knee: Secondary | ICD-10-CM | POA: Diagnosis present

## 2014-04-02 DIAGNOSIS — Z6841 Body Mass Index (BMI) 40.0 and over, adult: Secondary | ICD-10-CM

## 2014-04-02 DIAGNOSIS — Z96659 Presence of unspecified artificial knee joint: Secondary | ICD-10-CM | POA: Diagnosis not present

## 2014-04-02 DIAGNOSIS — M25469 Effusion, unspecified knee: Secondary | ICD-10-CM | POA: Diagnosis present

## 2014-04-02 DIAGNOSIS — E039 Hypothyroidism, unspecified: Secondary | ICD-10-CM | POA: Diagnosis present

## 2014-04-02 DIAGNOSIS — Z01812 Encounter for preprocedural laboratory examination: Secondary | ICD-10-CM

## 2014-04-02 DIAGNOSIS — I2510 Atherosclerotic heart disease of native coronary artery without angina pectoris: Secondary | ICD-10-CM | POA: Diagnosis present

## 2014-04-02 DIAGNOSIS — Z7982 Long term (current) use of aspirin: Secondary | ICD-10-CM | POA: Diagnosis not present

## 2014-04-02 HISTORY — PX: TOTAL KNEE REVISION: SHX996

## 2014-04-02 LAB — GLUCOSE, CAPILLARY
GLUCOSE-CAPILLARY: 111 mg/dL — AB (ref 70–99)
GLUCOSE-CAPILLARY: 185 mg/dL — AB (ref 70–99)
Glucose-Capillary: 110 mg/dL — ABNORMAL HIGH (ref 70–99)
Glucose-Capillary: 144 mg/dL — ABNORMAL HIGH (ref 70–99)

## 2014-04-02 LAB — TYPE AND SCREEN
ABO/RH(D): O POS
ANTIBODY SCREEN: NEGATIVE

## 2014-04-02 SURGERY — TOTAL KNEE REVISION
Anesthesia: General | Site: Knee | Laterality: Right

## 2014-04-02 MED ORDER — SODIUM CHLORIDE 0.9 % IR SOLN
Status: DC | PRN
  Administered 2014-04-02: 1000 mL

## 2014-04-02 MED ORDER — ACETAMINOPHEN 650 MG RE SUPP: 650.0000 mg | Freq: Four times a day (QID) | RECTAL | Status: DC | PRN

## 2014-04-02 MED ORDER — FUROSEMIDE 20 MG PO TABS
20.0000 mg | ORAL_TABLET | Freq: Every day | ORAL | Status: DC
  Administered 2014-04-02: 20 mg via ORAL
  Filled 2014-04-02 (×2): qty 1

## 2014-04-02 MED ORDER — BISACODYL 10 MG RE SUPP
10.0000 mg | Freq: Every day | RECTAL | Status: DC | PRN
Start: 2014-04-02 — End: 2014-04-04

## 2014-04-02 MED ORDER — ACETAMINOPHEN 325 MG PO TABS: 650.0000 mg | ORAL_TABLET | Freq: Four times a day (QID) | ORAL | Status: DC | PRN

## 2014-04-02 MED ORDER — MIDAZOLAM HCL 5 MG/5ML IJ SOLN
INTRAMUSCULAR | Status: DC | PRN
  Administered 2014-04-02: 2 mg via INTRAVENOUS

## 2014-04-02 MED ORDER — MIDAZOLAM HCL 2 MG/2ML IJ SOLN
INTRAMUSCULAR | Status: AC
  Filled 2014-04-02: qty 2

## 2014-04-02 MED ORDER — LIDOCAINE HCL (CARDIAC) 20 MG/ML IV SOLN
INTRAVENOUS | Status: AC
  Filled 2014-04-02: qty 5

## 2014-04-02 MED ORDER — HYDROMORPHONE HCL PF 1 MG/ML IJ SOLN
0.5000 mg | INTRAMUSCULAR | Status: DC | PRN
  Administered 2014-04-03: 1 mg via INTRAVENOUS
  Filled 2014-04-02 (×2): qty 1

## 2014-04-02 MED ORDER — CEFAZOLIN SODIUM-DEXTROSE 2-3 GM-% IV SOLR
2.0000 g | Freq: Four times a day (QID) | INTRAVENOUS | Status: AC
  Administered 2014-04-02 – 2014-04-03 (×2): 2 g via INTRAVENOUS
  Filled 2014-04-02 (×2): qty 50

## 2014-04-02 MED ORDER — DEXAMETHASONE SODIUM PHOSPHATE 10 MG/ML IJ SOLN
INTRAMUSCULAR | Status: DC | PRN
  Administered 2014-04-02: 10 mg via INTRAVENOUS

## 2014-04-02 MED ORDER — ONDANSETRON HCL 4 MG/2ML IJ SOLN
INTRAMUSCULAR | Status: AC
  Filled 2014-04-02: qty 2

## 2014-04-02 MED ORDER — FENTANYL CITRATE 0.05 MG/ML IJ SOLN
INTRAMUSCULAR | Status: AC
  Filled 2014-04-02: qty 5

## 2014-04-02 MED ORDER — ROCURONIUM BROMIDE 100 MG/10ML IV SOLN
INTRAVENOUS | Status: AC
  Filled 2014-04-02: qty 1

## 2014-04-02 MED ORDER — EPHEDRINE SULFATE 50 MG/ML IJ SOLN
INTRAMUSCULAR | Status: DC | PRN
  Administered 2014-04-02 (×2): 5 mg via INTRAVENOUS

## 2014-04-02 MED ORDER — ONDANSETRON HCL 4 MG PO TABS: 4.0000 mg | ORAL_TABLET | Freq: Four times a day (QID) | ORAL | Status: DC | PRN

## 2014-04-02 MED ORDER — LACTATED RINGERS IV SOLN: INTRAVENOUS | Status: DC

## 2014-04-02 MED ORDER — ACETAMINOPHEN 500 MG PO TABS
1000.0000 mg | ORAL_TABLET | Freq: Four times a day (QID) | ORAL | Status: AC
  Administered 2014-04-02 – 2014-04-03 (×4): 1000 mg via ORAL
  Filled 2014-04-02 (×4): qty 2

## 2014-04-02 MED ORDER — CHLORHEXIDINE GLUCONATE 4 % EX LIQD: 60.0000 mL | Freq: Once | CUTANEOUS | Status: DC

## 2014-04-02 MED ORDER — SUCCINYLCHOLINE CHLORIDE 20 MG/ML IJ SOLN
INTRAMUSCULAR | Status: DC | PRN
  Administered 2014-04-02: 140 mg via INTRAVENOUS

## 2014-04-02 MED ORDER — DIPHENHYDRAMINE HCL 12.5 MG/5ML PO ELIX: 12.5000 mg | ORAL_SOLUTION | ORAL | Status: DC | PRN

## 2014-04-02 MED ORDER — LIDOCAINE HCL (CARDIAC) 20 MG/ML IV SOLN
INTRAVENOUS | Status: DC | PRN
  Administered 2014-04-02: 100 mg via INTRAVENOUS

## 2014-04-02 MED ORDER — POTASSIUM CHLORIDE CRYS ER 20 MEQ PO TBCR
20.0000 meq | EXTENDED_RELEASE_TABLET | Freq: Every day | ORAL | Status: DC
  Filled 2014-04-02 (×2): qty 1

## 2014-04-02 MED ORDER — ONDANSETRON HCL 4 MG/2ML IJ SOLN: 4.0000 mg | Freq: Four times a day (QID) | INTRAMUSCULAR | Status: DC | PRN

## 2014-04-02 MED ORDER — SODIUM CHLORIDE 0.9 % IV SOLN: INTRAVENOUS | Status: DC

## 2014-04-02 MED ORDER — BUPIVACAINE LIPOSOME 1.3 % IJ SUSP
20.0000 mL | Freq: Once | INTRAMUSCULAR | Status: DC
  Filled 2014-04-02: qty 20

## 2014-04-02 MED ORDER — BUPIVACAINE-EPINEPHRINE (PF) 0.25% -1:200000 IJ SOLN
INTRAMUSCULAR | Status: DC | PRN
  Administered 2014-04-02: 30 mL

## 2014-04-02 MED ORDER — METOCLOPRAMIDE HCL 5 MG/ML IJ SOLN: 5.0000 mg | Freq: Three times a day (TID) | INTRAMUSCULAR | Status: DC | PRN

## 2014-04-02 MED ORDER — ESCITALOPRAM OXALATE 10 MG PO TABS
10.0000 mg | ORAL_TABLET | Freq: Every evening | ORAL | Status: DC
  Administered 2014-04-02 – 2014-04-03 (×2): 10 mg via ORAL
  Filled 2014-04-02 (×3): qty 1

## 2014-04-02 MED ORDER — FENTANYL CITRATE 0.05 MG/ML IJ SOLN
INTRAMUSCULAR | Status: DC | PRN
  Administered 2014-04-02 (×5): 50 ug via INTRAVENOUS

## 2014-04-02 MED ORDER — NEOSTIGMINE METHYLSULFATE 10 MG/10ML IV SOLN
INTRAVENOUS | Status: DC | PRN
  Administered 2014-04-02: 4 mg via INTRAVENOUS

## 2014-04-02 MED ORDER — PHENOL 1.4 % MT LIQD: 1.0000 | OROMUCOSAL | Status: DC | PRN

## 2014-04-02 MED ORDER — HYDROMORPHONE HCL PF 1 MG/ML IJ SOLN
INTRAMUSCULAR | Status: AC
  Administered 2014-04-02: 1 mg
  Filled 2014-04-02: qty 1

## 2014-04-02 MED ORDER — LACTATED RINGERS IV SOLN
INTRAVENOUS | Status: DC
  Administered 2014-04-02 (×2): via INTRAVENOUS
  Administered 2014-04-02: 1000 mL via INTRAVENOUS

## 2014-04-02 MED ORDER — PROPOFOL 10 MG/ML IV BOLUS
INTRAVENOUS | Status: AC
  Filled 2014-04-02: qty 20

## 2014-04-02 MED ORDER — INSULIN ASPART 100 UNIT/ML ~~LOC~~ SOLN
0.0000 [IU] | Freq: Three times a day (TID) | SUBCUTANEOUS | Status: DC
  Administered 2014-04-03 – 2014-04-04 (×4): 2 [IU] via SUBCUTANEOUS

## 2014-04-02 MED ORDER — HYDROMORPHONE HCL PF 1 MG/ML IJ SOLN
INTRAMUSCULAR | Status: AC
Start: 2014-04-02 — End: 2014-04-03
  Filled 2014-04-02: qty 1

## 2014-04-02 MED ORDER — POLYETHYLENE GLYCOL 3350 17 G PO PACK: 17.0000 g | PACK | Freq: Every day | ORAL | Status: DC | PRN

## 2014-04-02 MED ORDER — DEXAMETHASONE 6 MG PO TABS
10.0000 mg | ORAL_TABLET | Freq: Every day | ORAL | Status: AC
  Administered 2014-04-03: 10 mg via ORAL
  Filled 2014-04-02: qty 1

## 2014-04-02 MED ORDER — GLYCOPYRROLATE 0.2 MG/ML IJ SOLN
INTRAMUSCULAR | Status: AC
  Filled 2014-04-02: qty 3

## 2014-04-02 MED ORDER — SODIUM CHLORIDE 0.9 % IJ SOLN
INTRAMUSCULAR | Status: DC | PRN
  Administered 2014-04-02: 30 mL via INTRAVENOUS

## 2014-04-02 MED ORDER — PHENYLEPHRINE HCL 10 MG/ML IJ SOLN
INTRAMUSCULAR | Status: AC
  Filled 2014-04-02: qty 1

## 2014-04-02 MED ORDER — EPHEDRINE SULFATE 50 MG/ML IJ SOLN
INTRAMUSCULAR | Status: AC
  Filled 2014-04-02: qty 1

## 2014-04-02 MED ORDER — METHOCARBAMOL 1000 MG/10ML IJ SOLN
500.0000 mg | Freq: Four times a day (QID) | INTRAMUSCULAR | Status: DC | PRN
  Administered 2014-04-02: 500 mg via INTRAVENOUS
  Filled 2014-04-02: qty 5

## 2014-04-02 MED ORDER — 0.9 % SODIUM CHLORIDE (POUR BTL) OPTIME
TOPICAL | Status: DC | PRN
  Administered 2014-04-02: 1000 mL

## 2014-04-02 MED ORDER — METFORMIN HCL ER 500 MG PO TB24
500.0000 mg | ORAL_TABLET | Freq: Every day | ORAL | Status: DC
  Administered 2014-04-03: 500 mg via ORAL
  Filled 2014-04-02 (×2): qty 1

## 2014-04-02 MED ORDER — OXYCODONE HCL 5 MG PO TABS
5.0000 mg | ORAL_TABLET | ORAL | Status: DC | PRN
  Administered 2014-04-02: 5 mg via ORAL
  Administered 2014-04-03 – 2014-04-04 (×9): 10 mg via ORAL
  Filled 2014-04-02 (×5): qty 2
  Filled 2014-04-02: qty 1
  Filled 2014-04-02 (×4): qty 2

## 2014-04-02 MED ORDER — METOCLOPRAMIDE HCL 5 MG PO TABS
5.0000 mg | ORAL_TABLET | Freq: Three times a day (TID) | ORAL | Status: DC | PRN
  Filled 2014-04-02: qty 2

## 2014-04-02 MED ORDER — SODIUM CHLORIDE 0.9 % IJ SOLN
INTRAMUSCULAR | Status: AC
  Filled 2014-04-02: qty 50

## 2014-04-02 MED ORDER — PROMETHAZINE HCL 25 MG/ML IJ SOLN: 6.2500 mg | INTRAMUSCULAR | Status: DC | PRN

## 2014-04-02 MED ORDER — BUPIVACAINE HCL (PF) 0.25 % IJ SOLN
INTRAMUSCULAR | Status: AC
  Filled 2014-04-02: qty 30

## 2014-04-02 MED ORDER — DEXAMETHASONE SODIUM PHOSPHATE 10 MG/ML IJ SOLN: 10.0000 mg | Freq: Once | INTRAMUSCULAR | Status: DC

## 2014-04-02 MED ORDER — DOCUSATE SODIUM 100 MG PO CAPS
100.0000 mg | ORAL_CAPSULE | Freq: Two times a day (BID) | ORAL | Status: DC
  Administered 2014-04-02 – 2014-04-04 (×4): 100 mg via ORAL

## 2014-04-02 MED ORDER — BUPIVACAINE LIPOSOME 1.3 % IJ SUSP
INTRAMUSCULAR | Status: DC | PRN
  Administered 2014-04-02: 20 mL

## 2014-04-02 MED ORDER — FENTANYL CITRATE 0.05 MG/ML IJ SOLN
INTRAMUSCULAR | Status: AC
  Filled 2014-04-02: qty 2

## 2014-04-02 MED ORDER — LEVOTHYROXINE SODIUM 125 MCG PO TABS
125.0000 ug | ORAL_TABLET | Freq: Every day | ORAL | Status: DC
  Administered 2014-04-03 – 2014-04-04 (×2): 125 ug via ORAL
  Filled 2014-04-02 (×3): qty 1

## 2014-04-02 MED ORDER — PROPOFOL 10 MG/ML IV BOLUS
INTRAVENOUS | Status: DC | PRN
  Administered 2014-04-02: 200 mg via INTRAVENOUS

## 2014-04-02 MED ORDER — HYDROMORPHONE HCL PF 1 MG/ML IJ SOLN
0.2500 mg | INTRAMUSCULAR | Status: DC | PRN
  Administered 2014-04-02 (×4): 0.5 mg via INTRAVENOUS

## 2014-04-02 MED ORDER — MEPERIDINE HCL 50 MG/ML IJ SOLN: 6.2500 mg | INTRAMUSCULAR | Status: DC | PRN

## 2014-04-02 MED ORDER — GLYCOPYRROLATE 0.2 MG/ML IJ SOLN
INTRAMUSCULAR | Status: DC | PRN
  Administered 2014-04-02: 0.6 mg via INTRAVENOUS

## 2014-04-02 MED ORDER — PANTOPRAZOLE SODIUM 40 MG PO TBEC
40.0000 mg | DELAYED_RELEASE_TABLET | Freq: Every day | ORAL | Status: DC
  Administered 2014-04-02 – 2014-04-03 (×2): 40 mg via ORAL
  Filled 2014-04-02 (×3): qty 1

## 2014-04-02 MED ORDER — PHENYLEPHRINE HCL 10 MG/ML IJ SOLN
INTRAMUSCULAR | Status: DC | PRN
  Administered 2014-04-02 (×2): 40 ug via INTRAVENOUS

## 2014-04-02 MED ORDER — FENTANYL CITRATE 0.05 MG/ML IJ SOLN
25.0000 ug | INTRAMUSCULAR | Status: DC | PRN
  Administered 2014-04-02 (×2): 50 ug via INTRAVENOUS

## 2014-04-02 MED ORDER — RIVAROXABAN 10 MG PO TABS
10.0000 mg | ORAL_TABLET | Freq: Every day | ORAL | Status: DC
  Administered 2014-04-03 – 2014-04-04 (×2): 10 mg via ORAL
  Filled 2014-04-02 (×3): qty 1

## 2014-04-02 MED ORDER — METHOCARBAMOL 500 MG PO TABS
500.0000 mg | ORAL_TABLET | Freq: Four times a day (QID) | ORAL | Status: DC | PRN
  Administered 2014-04-03 – 2014-04-04 (×4): 500 mg via ORAL
  Filled 2014-04-02 (×4): qty 1

## 2014-04-02 MED ORDER — GABAPENTIN 300 MG PO CAPS
300.0000 mg | ORAL_CAPSULE | Freq: Two times a day (BID) | ORAL | Status: DC
  Administered 2014-04-02 – 2014-04-04 (×4): 300 mg via ORAL
  Filled 2014-04-02 (×5): qty 1

## 2014-04-02 MED ORDER — ACETAMINOPHEN 10 MG/ML IV SOLN
1000.0000 mg | Freq: Once | INTRAVENOUS | Status: AC
  Administered 2014-04-02: 1000 mg via INTRAVENOUS
  Filled 2014-04-02 (×2): qty 100

## 2014-04-02 MED ORDER — ONDANSETRON HCL 4 MG/2ML IJ SOLN
INTRAMUSCULAR | Status: DC | PRN
  Administered 2014-04-02: 4 mg via INTRAVENOUS

## 2014-04-02 MED ORDER — METOPROLOL SUCCINATE ER 25 MG PO TB24
25.0000 mg | ORAL_TABLET | Freq: Every day | ORAL | Status: DC
  Administered 2014-04-04: 25 mg via ORAL
  Filled 2014-04-02 (×2): qty 1

## 2014-04-02 MED ORDER — DEXTROSE-NACL 5-0.9 % IV SOLN
INTRAVENOUS | Status: AC
  Administered 2014-04-02: 21:00:00 via INTRAVENOUS

## 2014-04-02 MED ORDER — NEOSTIGMINE METHYLSULFATE 10 MG/10ML IV SOLN
INTRAVENOUS | Status: AC
  Filled 2014-04-02: qty 1

## 2014-04-02 MED ORDER — SIMVASTATIN 40 MG PO TABS
40.0000 mg | ORAL_TABLET | Freq: Every day | ORAL | Status: DC
  Administered 2014-04-02 – 2014-04-03 (×2): 40 mg via ORAL
  Filled 2014-04-02 (×3): qty 1

## 2014-04-02 MED ORDER — MENTHOL 3 MG MT LOZG
1.0000 | LOZENGE | OROMUCOSAL | Status: DC | PRN
  Filled 2014-04-02: qty 9

## 2014-04-02 MED ORDER — DEXAMETHASONE SODIUM PHOSPHATE 10 MG/ML IJ SOLN
10.0000 mg | Freq: Every day | INTRAMUSCULAR | Status: AC
  Filled 2014-04-02: qty 1

## 2014-04-02 MED ORDER — ROCURONIUM BROMIDE 100 MG/10ML IV SOLN
INTRAVENOUS | Status: DC | PRN
  Administered 2014-04-02: 40 mg via INTRAVENOUS

## 2014-04-02 MED ORDER — FLEET ENEMA 7-19 GM/118ML RE ENEM: 1.0000 | ENEMA | Freq: Once | RECTAL | Status: AC | PRN

## 2014-04-02 SURGICAL SUPPLY — 66 items
BAG SPEC THK2 15X12 ZIP CLS (MISCELLANEOUS)
BAG ZIPLOCK 12X15 (MISCELLANEOUS) IMPLANT
BANDAGE ELASTIC 6 VELCRO ST LF (GAUZE/BANDAGES/DRESSINGS) ×2 IMPLANT
BANDAGE ESMARK 6X9 LF (GAUZE/BANDAGES/DRESSINGS) ×1 IMPLANT
BLADE SAG 18X100X1.27 (BLADE) ×2 IMPLANT
BLADE SAW SGTL 11.0X1.19X90.0M (BLADE) ×2 IMPLANT
BNDG CMPR 9X6 STRL LF SNTH (GAUZE/BANDAGES/DRESSINGS) ×1
BNDG ESMARK 6X9 LF (GAUZE/BANDAGES/DRESSINGS) ×2
BONE CEMENT GENTAMICIN (Cement) ×4 IMPLANT
CEMENT BONE GENTAMICIN 40 (Cement) ×3 IMPLANT
CUFF TOURN SGL QUICK 34 (TOURNIQUET CUFF) ×2
CUFF TRNQT CYL 34X4X40X1 (TOURNIQUET CUFF) ×1 IMPLANT
DRAPE EXTREMITY T 121X128X90 (DRAPE) ×2 IMPLANT
DRAPE LG THREE QUARTER DISP (DRAPES) ×2 IMPLANT
DRAPE POUCH INSTRU U-SHP 10X18 (DRAPES) ×2 IMPLANT
DRAPE U-SHAPE 47X51 STRL (DRAPES) ×2 IMPLANT
DRSG ADAPTIC 3X8 NADH LF (GAUZE/BANDAGES/DRESSINGS) ×2 IMPLANT
DRSG PAD ABDOMINAL 8X10 ST (GAUZE/BANDAGES/DRESSINGS) ×2 IMPLANT
DURAPREP 26ML APPLICATOR (WOUND CARE) ×2 IMPLANT
ELECT REM PT RETURN 9FT ADLT (ELECTROSURGICAL) ×2
ELECTRODE REM PT RTRN 9FT ADLT (ELECTROSURGICAL) ×1 IMPLANT
EVACUATOR 1/8 PVC DRAIN (DRAIN) ×2 IMPLANT
FACESHIELD WRAPAROUND (MASK) ×10 IMPLANT
FACESHIELD WRAPAROUND OR TEAM (MASK) ×5 IMPLANT
FEMUR  HINGE RT X SMALL (Orthopedic Implant) ×1 IMPLANT
FEMUR HINGE RT X SMALL (Orthopedic Implant) ×1 IMPLANT
FEMUR HINGED RT X SMALL (Orthopedic Implant) IMPLANT
GAUZE SPONGE 4X4 12PLY STRL (GAUZE/BANDAGES/DRESSINGS) ×2 IMPLANT
GLOVE BIO SURGEON STRL SZ7.5 (GLOVE) IMPLANT
GLOVE BIO SURGEON STRL SZ8 (GLOVE) ×2 IMPLANT
GLOVE BIOGEL PI IND STRL 8 (GLOVE) ×1 IMPLANT
GLOVE BIOGEL PI INDICATOR 8 (GLOVE) ×1
GLOVE SURG SS PI 6.5 STRL IVOR (GLOVE) IMPLANT
GOWN STRL REUS W/TWL LRG LVL3 (GOWN DISPOSABLE) ×2 IMPLANT
GOWN STRL REUS W/TWL XL LVL3 (GOWN DISPOSABLE) IMPLANT
HANDPIECE INTERPULSE COAX TIP (DISPOSABLE) ×2
IMMOBILIZER KNEE 20 (SOFTGOODS) ×3 IMPLANT
IMMOBILIZER KNEE 20 THIGH 36 (SOFTGOODS) ×1 IMPLANT
INSERT HINGE UNIV XSM KNEE (Insert) ×1 IMPLANT
KIT BASIN OR (CUSTOM PROCEDURE TRAY) ×2 IMPLANT
MANIFOLD NEPTUNE II (INSTRUMENTS) ×2 IMPLANT
NDL SAFETY ECLIPSE 18X1.5 (NEEDLE) ×2 IMPLANT
NEEDLE HYPO 18GX1.5 SHARP (NEEDLE) ×4
NS IRRIG 1000ML POUR BTL (IV SOLUTION) ×2 IMPLANT
PACK TOTAL JOINT (CUSTOM PROCEDURE TRAY) ×2 IMPLANT
PAD ABD 8X10 STRL (GAUZE/BANDAGES/DRESSINGS) ×1 IMPLANT
PADDING CAST COTTON 6X4 STRL (CAST SUPPLIES) ×3 IMPLANT
POSITIONER SURGICAL ARM (MISCELLANEOUS) ×2 IMPLANT
SET HNDPC FAN SPRY TIP SCT (DISPOSABLE) ×1 IMPLANT
SLEEVE FEM UNIV FULL PRO SZ34 (Sleeve) ×1 IMPLANT
STAPLER VISISTAT 35W (STAPLE) ×3 IMPLANT
STEM UNIVERSAL REVISION 75X16 (Stem) ×1 IMPLANT
SUCTION FRAZIER 12FR DISP (SUCTIONS) ×2 IMPLANT
SUT VIC AB 2-0 CT1 27 (SUTURE) ×6
SUT VIC AB 2-0 CT1 TAPERPNT 27 (SUTURE) ×3 IMPLANT
SUT VLOC 180 0 24IN GS25 (SUTURE) ×2 IMPLANT
SWAB COLLECTION DEVICE MRSA (MISCELLANEOUS) IMPLANT
SYR 20CC LL (SYRINGE) ×2 IMPLANT
SYR 50ML LL SCALE MARK (SYRINGE) ×2 IMPLANT
TOWEL OR 17X26 10 PK STRL BLUE (TOWEL DISPOSABLE) ×2 IMPLANT
TOWEL OR NON WOVEN STRL DISP B (DISPOSABLE) IMPLANT
TOWER CARTRIDGE SMART MIX (DISPOSABLE) ×2 IMPLANT
TRAY FOLEY CATH 14FRSI W/METER (CATHETERS) ×2 IMPLANT
TUBE ANAEROBIC SPECIMEN COL (MISCELLANEOUS) IMPLANT
WATER STERILE IRR 1500ML POUR (IV SOLUTION) ×2 IMPLANT
WRAP KNEE MAXI GEL POST OP (GAUZE/BANDAGES/DRESSINGS) ×1 IMPLANT

## 2014-04-02 NOTE — Transfer of Care (Signed)
Immediate Anesthesia Transfer of Care Note  Patient: Andrea Norton  Procedure(s) Performed: Procedure(s): RIGHT KNEE FEMORAL ARTHROPLASTY REVISION (Right)  Patient Location: PACU  Anesthesia Type:General  Level of Consciousness: awake, alert  and oriented  Airway & Oxygen Therapy: Patient Spontanous Breathing and Patient connected to face mask oxygen  Post-op Assessment: Report given to PACU RN and Post -op Vital signs reviewed and stable  Post vital signs: Reviewed and stable  Complications: No apparent anesthesia complications

## 2014-04-02 NOTE — Interval H&P Note (Signed)
History and Physical Interval Note:  04/02/2014 1:24 PM  Andrea Norton  has presented today for surgery, with the diagnosis of PAINFUL TOTAL KNEE ARTHROPLASTY  The various methods of treatment have been discussed with the patient and family. After consideration of risks, benefits and other options for treatment, the patient has consented to  Procedure(s): RIGHT KNEE FEMORAL VS TOTAL KNEE ARTHROPLASTY REVISION (Right) as a surgical intervention .  The patient's history has been reviewed, patient examined, no change in status, stable for surgery.  I have reviewed the patient's chart and labs.  Questions were answered to the patient's satisfaction.     Loanne Drilling

## 2014-04-02 NOTE — Anesthesia Preprocedure Evaluation (Signed)
Anesthesia Evaluation  Patient identified by MRN, date of birth, ID band Patient awake    Reviewed: Allergy & Precautions, H&P , NPO status , Patient's Chart, lab work & pertinent test results  Airway Mallampati: II TM Distance: >3 FB Neck ROM: Full    Dental no notable dental hx.    Pulmonary shortness of breath, sleep apnea , former smoker,  breath sounds clear to auscultation  Pulmonary exam normal       Cardiovascular hypertension, Pt. on medications and Pt. on home beta blockers + CAD negative cardio ROS  Rhythm:Regular Rate:Normal     Neuro/Psych PSYCHIATRIC DISORDERS Depression negative neurological ROS     GI/Hepatic Neg liver ROS, GERD-  ,  Endo/Other  diabetes, Type 2, Oral Hypoglycemic AgentsHypothyroidism Morbid obesity  Renal/GU negative Renal ROS  negative genitourinary   Musculoskeletal negative musculoskeletal ROS (+)   Abdominal (+) + obese,   Peds negative pediatric ROS (+)  Hematology negative hematology ROS (+)   Anesthesia Other Findings   Reproductive/Obstetrics negative OB ROS                           Anesthesia Physical  Anesthesia Plan  ASA: III  Anesthesia Plan: General   Post-op Pain Management:    Induction: Intravenous  Airway Management Planned: Oral ETT  Additional Equipment:   Intra-op Plan:   Post-operative Plan: Extubation in OR  Informed Consent: I have reviewed the patients History and Physical, chart, labs and discussed the procedure including the risks, benefits and alternatives for the proposed anesthesia with the patient or authorized representative who has indicated his/her understanding and acceptance.   Dental advisory given  Plan Discussed with: CRNA  Anesthesia Plan Comments: (Discussed general versus spinal. Patient prefers general.)        Anesthesia Quick Evaluation

## 2014-04-02 NOTE — Anesthesia Postprocedure Evaluation (Signed)
  Anesthesia Post-op Note  Patient: Andrea Norton  Procedure(s) Performed: Procedure(s) (LRB): RIGHT KNEE FEMORAL ARTHROPLASTY REVISION (Right)  Patient Location: PACU  Anesthesia Type: General  Level of Consciousness: awake and alert   Airway and Oxygen Therapy: Patient Spontanous Breathing  Post-op Pain: mild  Post-op Assessment: Post-op Vital signs reviewed, Patient's Cardiovascular Status Stable, Respiratory Function Stable, Patent Airway and No signs of Nausea or vomiting  Last Vitals:  Filed Vitals:   04/02/14 1730  BP: 115/47  Pulse: 86  Temp:   Resp: 16    Post-op Vital Signs: stable   Complications: No apparent anesthesia complications

## 2014-04-02 NOTE — Brief Op Note (Signed)
04/02/2014  4:36 PM  PATIENT:  Andrea Norton  68 y.o. female  PRE-OPERATIVE DIAGNOSIS:  PAINFUL RIGHT TOTAL KNEE ARTHROPLASTY  POST-OPERATIVE DIAGNOSIS:  PAINFUL RIGHT TOTAL KNEE ARTHROPLASTY  PROCEDURE:  Procedure(s): RIGHT KNEE FEMORAL ARTHROPLASTY REVISION (Right)  SURGEON:  Surgeon(s) and Role:    Loanne Drilling, MD - Primary  PHYSICIAN ASSISTANT:   ASSISTANTS: [name], PA-C   ANESTHESIA:   general  EBL:  Total I/O In: 2000 [I.V.:2000] Out: 400 [Urine:250; Blood:150]  BLOOD ADMINISTERED:none  DRAINS: (Medium) Hemovact drain(s) in the right knee with  Suction Open   LOCAL MEDICATIONS USED:  OTHER Exparel  COUNTS:  YES  TOURNIQUET:   Total Tourniquet Time Documented: Thigh (Right) - 67 minutes Thigh (Right) - 14 minutes Total: Thigh (Right) - 81 minutes   DICTATION: .Other Dictation: Dictation Number [id number]  PLAN OF CARE: Admit to inpatient   PATIENT DISPOSITION:  PACU - hemodynamically stable.

## 2014-04-02 NOTE — Progress Notes (Signed)
Patient states she finished taking her Cipro for UTI 

## 2014-04-02 NOTE — Op Note (Signed)
Andrea Norton, Andrea Norton NO.:  [id number]  MEDICAL RECORD NO.:  [id number]  LOCATION:  1621                         FACILITY:  [facility]  PHYSICIAN:  Ollen Gross, M.D.    DATE OF BIRTH:  [birth year]  DATE OF PROCEDURE:  04/02/2014 DATE OF DISCHARGE:                              OPERATIVE REPORT   PREOPERATIVE DIAGNOSIS:  Failed painful right total knee arthroplasty.  POSTOPERATIVE DIAGNOSIS:  Failed painful right total knee arthroplasty.  PROCEDURE:  Right femoral revision.  SURGEON:  Ollen Gross, M.D.  ASSISTANT:  Alexzandrew L. Perkins, P.A.C.  ANESTHESIA:  General.  ESTIMATED BLOOD LOSS:  Minimal.  DRAINS:  Hemovac x1.  TOURNIQUET TIME:  Up 66 minutes at 300 mmHg, down 8 minutes, up additional 15 minutes at 300 mmHg.  COMPLICATIONS:  None.  CONDITION:  Stable to recovery.  BRIEF CLINICAL NOTE:  Ms. Boeder is a 68 year old female, long complex history in regard to her right knee.  She had a revision last year, was left with a significant flexion contracture and has pain and significant dysfunction regard to that.  She has tried extensive nonoperative management.  It was felt that she potentially needs to undergo femoral revision in order to elevate the joint line enough to improve her extension.  PROCEDURE IN DETAIL:  After successful administration of a general anesthetic, a tourniquet was placed high on her right thigh and her right lower extremity was prepped and draped in the usual sterile fashion.  Extremities wrapped in Esmarch, tourniquet inflated to 300 mmHg.  A midline incision was made with a 10 blade through subcutaneous tissue to the extensor mechanism.  A fresh blade was used to make a medial parapatellar arthrotomy.  Soft tissue on the proximal medial tibia was subperiosteally elevated to the joint line with a knife into the semimembranosus bursa with a Cobb elevator.  We did not identify any abnormal appearing fluid or tissue.  The  tibia essentially dislocated anteriorly from the femur.  Tibial polyethylene was removed.  Tibial component was well fixed in an excellent position.  It is felt that given the flexion-extension mismatch with the only way to improve the balance and allow for full extension is going to be to revise the femoral component.  We thus disrupted the interface between the femoral component and bone and eventually get the component out.  The component came out, but the sleeve and stem remained.  I had to use flexible osteotomes to disrupt the interface between the sleeve and bone eventually, we were able to remove it fortunately with no bone loss. Given her significant history of laxity, I decided to pursue a femur for a hinged knee.  Fortunately, we would not have to change out the tibial component to accommodate the hinged knee.  We then reamed the femoral canal to 16 mm for 16 mm stem.  The last reamer is used as our extramedullary cutting guide and we placed the distal femoral cutting block to remove 4 mm up to the medial and lateral side.  Resection was made with an oscillating saw.  I felt that moving joint line up further with the help with our extension gap.  Indeed,  we had equal flexion- extension gaps after this was done.  The sizing is an extra-small for the hinged knee.  The extra-small cutting block was placed and the anterior-posterior and chamfer cuts made.  The intercondylar blocks placed and that cuts made.  Trial then set up with the extra-small femur with a 34 sleeve.  We had prepared for a 34 sleeve as that broach had the best stability.  We had the 16 x 75 stem to that.  Trial was placed with the 31 insert which was the maximum thickness.  We achieved full extension with excellent balance throughout full range of motion.  We did not even have to put hinged pin in to have a very stable knee.  We then removed the trial.  I released the tourniquet for initial tourniquet time of 66  minutes.  We did not encounter any significant bleeding.  Tourniquet was down 8 minutes while all the components assembled on the back table.  We then rewrapped the leg in Esmarch and reinflated to 300 mmHg.  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  The component was assembled on the back table which is the extra-small Noiles hinged femur with the 34 sleeve and the 16 x 75 stem.  Once the tourniquet was reinflated, we thoroughly irrigated the bone with saline using pulsatile lavage and mixed the cement which was 2 batches of gentamicin impregnated cement.  The component was then cemented in with cement distal, but press-fit stem and press-fit sleeve. We had excellent stability with this.  We placed the 31 mm insert after I removed all of the extruded cement.  We then placed the locking pin for the hinge.  It was locked into position.  Full extensions achieved with excellent balance throughout full range of motion.  Wound was then copiously irrigated with saline solution.  The arthrotomy closed over Hemovac drain with a running #1 V-Loc suture.  Tourniquet released second time of 15 minutes.  Flexion against gravity about 125 degrees, patella tracks normally.  Subcu was closed with interrupted 2-0 Vicryl and skin with staples.  We did place a second limb of the Hemovac in the subcu tissues prior to closing that layer.  Drains hooked to suction. Incision cleaned and dried and a bulky sterile dressing applied.  Please note that, we also injected 20 mL of Exparel mixed with 40 mL of saline into the extensor mechanism and subcu tissues.  Additional 20 mL of 0.25% Marcaine was injected into the same tissues.  The patient was then awakened and transported to recovery in stable condition.  Note that the surgical assistant was medical necessity for this procedure to perform it in a safe and expeditious  manner.  Surgical assistant was necessary for the safe removal of the old prosthesis and for proper limb positioning and reduction of the new prosthesis.  This allowed for safe placement of the new prosthesis.     Ollen Gross, M.D.     FA/MEDQ  D:  04/02/2014  T:  04/02/2014  Job:  [id number]

## 2014-04-02 NOTE — Progress Notes (Signed)
Do not give Metopolol today in short stay per Dr. Acey Lav.

## 2014-04-03 LAB — BASIC METABOLIC PANEL
Anion gap: 8 (ref 5–15)
Anion gap: 10 (ref 5–15)
BUN: 12 mg/dL (ref 6–23)
BUN: 11 mg/dL (ref 6–23)
CHLORIDE: 101 meq/L (ref 96–112)
CO2: 26 meq/L (ref 19–32)
CO2: 25 mEq/L (ref 19–32)
Calcium: 8.7 mg/dL (ref 8.4–10.5)
Calcium: 8.6 mg/dL (ref 8.4–10.5)
Chloride: 105 mEq/L (ref 96–112)
Creatinine, Ser: 0.72 mg/dL (ref 0.50–1.10)
Creatinine, Ser: 0.68 mg/dL (ref 0.50–1.10)
GFR calc Af Amer: >90 mL/min (ref >90)
GFR calc non Af Amer: 88 mL/min — ABNORMAL LOW (ref >90)
GFR, EST NON AFRICAN AMERICAN: 89 mL/min — AB (ref >90)
Glucose, Bld: 179 mg/dL — ABNORMAL HIGH (ref 70–99)
Glucose, Bld: 138 mg/dL — ABNORMAL HIGH (ref 70–99)
POTASSIUM: 5.4 meq/L — AB (ref 3.7–5.3)
Potassium: 4.3 mEq/L (ref 3.7–5.3)
SODIUM: 139 meq/L (ref 137–147)
SODIUM: 136 meq/L — AB (ref 137–147)

## 2014-04-03 LAB — GLUCOSE, CAPILLARY
GLUCOSE-CAPILLARY: 170 mg/dL — AB (ref 70–99)
Glucose-Capillary: 131 mg/dL — ABNORMAL HIGH (ref 70–99)
Glucose-Capillary: 148 mg/dL — ABNORMAL HIGH (ref 70–99)
Glucose-Capillary: 138 mg/dL — ABNORMAL HIGH (ref 70–99)

## 2014-04-03 LAB — CBC
HCT: 34.3 % — ABNORMAL LOW (ref 36.0–46.0)
Hemoglobin: 10.8 g/dL — ABNORMAL LOW (ref 12.0–15.0)
MCH: 26.4 pg (ref 26.0–34.0)
MCHC: 31.5 g/dL (ref 30.0–36.0)
MCV: 83.9 fL (ref 78.0–100.0)
PLATELETS: 245 10*3/uL (ref 150–400)
RBC: 4.09 MIL/uL (ref 3.87–5.11)
RDW: 14.8 % (ref 11.5–15.5)
WBC: 10.1 10*3/uL (ref 4.0–10.5)

## 2014-04-03 MED ORDER — SODIUM CHLORIDE 0.9 % IV BOLUS (SEPSIS)
250.0000 mL | Freq: Once | INTRAVENOUS | Status: AC
  Administered 2014-04-03: 250 mL via INTRAVENOUS

## 2014-04-03 NOTE — Progress Notes (Signed)
Subjective: 1 Day Post-Op Procedure(s) (LRB): RIGHT KNEE FEMORAL ARTHROPLASTY REVISION (Right) Patient reports pain as mild.   Patient seen in rounds for Dr. Lequita Halt.  She had a decent night but no sleep. Patient is well, but has had some minor complaints of pain in the knee, requiring pain medications We will start therapy today.  Plan is to go Home after hospital stay.  Objective: Vital signs in last 24 hours: Temp:  [97.6 F (36.4 C)-98.8 F (37.1 C)] 97.6 F (36.4 C) (09/10 0605) Pulse Rate:  [70-88] 70 (09/10 0605) Resp:  [11-20] 20 (09/10 0605) BP: (98-135)/(46-82) 103/55 mmHg (09/10 0605) SpO2:  [93 %-99 %] 99 % (09/10 0605) Weight:  [134.718 kg (297 lb)] 134.718 kg (297 lb) (09/09 1125)  Intake/Output from previous day:  Intake/Output Summary (Last 24 hours) at 04/03/14 0817 Last data filed at 04/03/14 0605  Gross per 24 hour  Intake   3615 ml  Output   3735 ml  Net   -120 ml    Intake/Output this shift: UOP 1750 since MN -120 on totals showing negative.  Labs:  Recent Labs  04/03/14 0500  HGB 10.8*    Recent Labs  04/03/14 0500  WBC 10.1  RBC 4.09  HCT 34.3*  PLT 245    Recent Labs  04/03/14 0500  NA 139  K 5.4*  CL 105  CO2 26  BUN 12  CREATININE 0.72  GLUCOSE 179*  CALCIUM 8.7   No results found for this basename: LABPT, INR,  in the last 72 hours  EXAM General - Patient is Alert, Appropriate and Oriented Extremity - Neurovascular intact Sensation intact distally Dressing - dressing C/D/I Motor Function - intact, moving foot and toes well on exam.  Hemovac still with output so will leave until tomorrow.  Past Medical History  Diagnosis Date  . Hyperlipidemia   . Status post primary angioplasty with coronary stent     DRUG-ELUTING STENT X1  TO PROXIMAL LAD  . Chronic back pain   . Chronic neck pain   . Arthritis     HANDS, KNEES  . DDD (degenerative disc disease), cervical   . DDD (degenerative disc disease), lumbar   .  Radicular pain in left arm OCCASIONAL PAIN SHOOTING DOWN LEFT ARM SECAONDARY TO  CERVICAL DEGENERATION  . Hemorrhoid   . Diverticulitis of colon   . B12 deficiency anemia   . Vitamin D deficiency   . Hypothyroidism   . Synovitis of knee HYPERTROPHIC RIGHT KNEE  . Abnormal stress test 01-22-2009    LOW RISK ADENOSINE NUCLEAR STUDY W/ PROBABLE MILD APICAL THINNING BUT NO ISCHEMIA  . Depression     states well controlled  . GERD (gastroesophageal reflux disease)   . Inguinal hernia, right     with ventral hernia  . Hypertension       PCP Dr Lysbeth Galas    . Diabetes mellitus     ORAL MED - NO INSULIN  . Hemorrhoids   . Coronary artery disease 2005    STRESS TEST /, NOte Dr Kirtland Bouchard EPIC/ no cardiologist now--STATES HEART STENT PLACED 2010  . Sleep apnea     couldnt tolerate machine- states last sleep study " many years ago"  . Bronchitis     hx of   . H/O hiatal hernia     Assessment/Plan: 1 Day Post-Op Procedure(s) (LRB): RIGHT KNEE FEMORAL ARTHROPLASTY REVISION (Right) Active Problems:   Failed total knee arthroplasty  Estimated body mass index is 54.31  kg/(m^2) as calculated from the following:   Height as of this encounter:  (1.575 m).   Weight as of this encounter: 134.718 kg (297 lb). Advance diet Up with therapy Discharge home with home health  DVT Prophylaxis - Xarelto Weight-Bearing as tolerated to right leg D/C O2 and Pulse OX and try on Room Air  K+ was noted to be 5.4.  Likely hemolysis.  Will draw again later this morning and recheck labs.  [name], PA-C Orthopaedic Surgery 04/03/2014, 8:17 AM

## 2014-04-03 NOTE — Progress Notes (Signed)
Utilization review completed.  

## 2014-04-03 NOTE — Progress Notes (Signed)
Physical Therapy Treatment Patient Details Name: Andrea Norton MRN: [id number] DOB: [birth year] Today's Date: 04/03/2014    History of Present Illness R TKR rev    PT Comments      Follow Up Recommendations  Home health PT     Equipment Recommendations  None recommended by PT    Recommendations for Other Services OT consult     Precautions / Restrictions Precautions Precautions: Knee;Fall Required Braces or Orthoses: Knee Immobilizer - Right Knee Immobilizer - Right: Discontinue once straight leg raise with < 10 degree lag Restrictions Weight Bearing Restrictions: No Other Position/Activity Restrictions: WBAT    Mobility  Bed Mobility Overal bed mobility: Needs Assistance Bed Mobility: Sit to Supine       Sit to supine: Min assist   General bed mobility comments: min assist for R LE; min cues for sequence  Transfers Overall transfer level: Needs assistance Equipment used: Rolling walker (2 wheeled) Transfers: Sit to/from Stand Sit to Stand: Min assist;From elevated surface         General transfer comment: cues for LE management and use of UEs to self assist  Ambulation/Gait Ambulation/Gait assistance: Min assist;Min guard Ambulation Distance (Feet): 147 Feet Assistive device: Rolling walker (2 wheeled) Gait Pattern/deviations: Step-to pattern;Decreased step length - right;Decreased step length - left;Shuffle;Trunk flexed     General Gait Details: cues for posture, position from RW and initial sequence   Stairs            Wheelchair Mobility    Modified Rankin (Stroke Patients Only)       Balance                                    Cognition Arousal/Alertness: Awake/alert Behavior During Therapy: WFL for tasks assessed/performed Overall Cognitive Status: Within Functional Limits for tasks assessed                      Exercises Total Joint Exercises Ankle Circles/Pumps: AROM;Both;15 reps;Supine Quad Sets:  AROM;Both;Supine;15 reps Heel Slides: AAROM;15 reps;Right;Supine Straight Leg Raises: AAROM;Right;15 reps;Supine    General Comments        Pertinent Vitals/Pain Pain Assessment: 0-10 Pain Score: 2  Pain Location: R knee Pain Intervention(s): Limited activity within patient's tolerance;Monitored during session;Premedicated before session;Ice applied    Home Living                      Prior Function            PT Goals (current goals can now be found in the care plan section) Acute Rehab PT Goals Patient Stated Goal: Be able to straighten my knee all the way PT Goal Formulation: With patient Time For Goal Achievement: 04/10/14 Potential to Achieve Goals: Good Progress towards PT goals: Progressing toward goals    Frequency  7X/week    PT Plan Current plan remains appropriate    Co-evaluation             End of Session Equipment Utilized During Treatment: Gait belt;Right knee immobilizer Activity Tolerance: Patient tolerated treatment well Patient left: in bed;with call bell/phone within reach;with family/visitor present     Time: 1353-1426 PT Time Calculation (min): 33 min  Charges:  $Gait Training: 8-22 mins $Therapeutic Exercise: 8-22 mins                    G Codes:  Ricke Kimoto 04/03/2014, 4:18 PM

## 2014-04-03 NOTE — Discharge Instructions (Addendum)
° °Dr. Frank Aluisio °Total Joint Specialist °[facility] °[address] °[place], [place] [address] °[phone] ° °TOTAL KNEE REPLACEMENT POSTOPERATIVE DIRECTIONS ° ° ° °Knee Rehabilitation, Guidelines Following Surgery  °Results after knee surgery are often greatly improved when you follow the exercise, range of motion and muscle strengthening exercises prescribed by your doctor. Safety measures are also important to protect the knee from further injury. Any time any of these exercises cause you to have increased pain or swelling in your knee joint, decrease the amount until you are comfortable again and slowly increase them. If you have problems or questions, call your caregiver or physical therapist for advice.  ° °HOME CARE INSTRUCTIONS  °Remove items at home which could result in a fall. This includes throw rugs or furniture in walking pathways.  °Continue medications as instructed at time of discharge. °You may have some home medications which will be placed on hold until you complete the course of blood thinner medication.  °You may start showering once you are discharged home but do not submerge the incision under water. Just pat the incision dry and apply a dry gauze dressing on daily. °Walk with walker as instructed.  °You may resume a sexual relationship in one month or when given the OK by  your doctor.  °· Use walker as long as suggested by your caregivers. °· Avoid periods of inactivity such as sitting longer than an hour when not asleep. This helps prevent blood clots.  °You may put full weight on your legs and walk as much as is comfortable.  °You may return to work once you are cleared by your doctor.  °Do not drive a car for 6 weeks or until released by you surgeon.  °· Do not drive while taking narcotics.  °Wear the elastic stockings for three weeks following surgery during the day but you may remove then at night. °Make sure you keep all of your appointments after your  operation with all of your doctors and caregivers. You should call the office at the above phone number and make an appointment for approximately two weeks after the date of your surgery. °Change the dressing daily and reapply a dry dressing each time. °Please pick up a stool softener and laxative for home use as long as you are requiring pain medications. °· Continue to use ice on the knee for pain and swelling from surgery. You may notice swelling that will progress down to the foot and ankle.  This is normal after surgery.  Elevate the leg when you are not up walking on it.   °It is important for you to complete the blood thinner medication as prescribed by your doctor. °· Continue to use the breathing machine which will help keep your temperature down.  It is common for your temperature to cycle up and down following surgery, especially at night when you are not up moving around and exerting yourself.  The breathing machine keeps your lungs expanded and your temperature down. ° °RANGE OF MOTION AND STRENGTHENING EXERCISES  °Rehabilitation of the knee is important following a knee injury or an operation. After just a few days of immobilization, the muscles of the thigh which control the knee become weakened and shrink (atrophy). Knee exercises are designed to build up the tone and strength of the thigh muscles and to improve knee motion. Often times heat used for twenty to thirty minutes before working out will loosen up your tissues and help with improving the   range of motion but do not use heat for the first two weeks following surgery. These exercises can be done on a training (exercise) mat, on the floor, on a table or on a bed. Use what ever works the best and is most comfortable for you Knee exercises include:  °Leg Lifts - While your knee is still immobilized in a splint or cast, you can do straight leg raises. Lift the leg to 60 degrees, hold for 3 sec, and slowly lower the leg. Repeat 10-20 times 2-3  times daily. Perform this exercise against resistance later as your knee gets better.  °Quad and Hamstring Sets - Tighten up the muscle on the front of the thigh (Quad) and hold for 5-10 sec. Repeat this 10-20 times hourly. Hamstring sets are done by pushing the foot backward against an object and holding for 5-10 sec. Repeat as with quad sets.  °A rehabilitation program following serious knee injuries can speed recovery and prevent re-injury in the future due to weakened muscles. Contact your doctor or a physical therapist for more information on knee rehabilitation.  ° °SKILLED REHAB INSTRUCTIONS: °If the patient is transferred to a skilled rehab facility following release from the hospital, a list of the current medications will be sent to the facility for the patient to continue.  When discharged from the skilled rehab facility, please have the facility set up the patient's Home Health Physical Therapy prior to being released. Also, the skilled facility will be responsible for providing the patient with their medications at time of release from the facility to include their pain medication, the muscle relaxants, and their blood thinner medication. If the patient is still at the rehab facility at time of the two week follow up appointment, the skilled rehab facility will also need to assist the patient in arranging follow up appointment in our office and any transportation needs. ° °MAKE SURE YOU:  °Understand these instructions.  °Will watch your condition.  °Will get help right away if you are not doing well or get worse.  ° ° °Pick up stool softner and laxative for home. °Do not submerge incision under water. °May shower. °Continue to use ice for pain and swelling from surgery. ° °Take Xarelto for two and a half more weeks, then discontinue Xarelto. °Once the patient has completed the Xarelto, they may resume the 81 mg Aspirin. ° °Information on my medicine - XARELTO® (Rivaroxaban) ° °This medication education  was reviewed with me or my healthcare representative as part of my discharge preparation.  The pharmacist that spoke with me during my hospital stay was:  Zeigler, Dustin George, RPH ° °Why was Xarelto® prescribed for you? °Xarelto® was prescribed for you to reduce the risk of blood clots forming after orthopedic surgery. The medical term for these abnormal blood clots is venous thromboembolism (VTE). ° °What do you need to know about xarelto® ? °Take your Xarelto® ONCE DAILY at the same time every day. °You may take it either with or without food. ° °If you have difficulty swallowing the tablet whole, you may crush it and mix in applesauce just prior to taking your dose. ° °Take Xarelto® exactly as prescribed by your doctor and DO NOT stop taking Xarelto® without talking to the doctor who prescribed the medication.  Stopping without other VTE prevention medication to take the place of Xarelto® may increase your risk of developing a clot. ° °After discharge, you should have regular check-up appointments with your healthcare provider that is prescribing   your Xarelto®.   ° °What do you do if you miss a dose? °If you miss a dose, take it as soon as you remember on the same day then continue your regularly scheduled once daily regimen the next day. Do not take two doses of Xarelto® on the same day.  ° °Important Safety Information °A possible side effect of Xarelto® is bleeding. You should call your healthcare provider right away if you experience any of the following: °  Bleeding from an injury or your nose that does not stop. °  Unusual colored urine (red or dark brown) or unusual colored stools (red or black). °  Unusual bruising for unknown reasons. °  A serious fall or if you hit your head (even if there is no bleeding). ° °Some medicines may interact with Xarelto® and might increase your risk of bleeding while on Xarelto®. To help avoid this, consult your healthcare provider or pharmacist prior to using any new  prescription or non-prescription medications, including herbals, vitamins, non-steroidal anti-inflammatory drugs (NSAIDs) and supplements. ° °This website has more information on Xarelto®: www.xarelto.com. ° ° °

## 2014-04-03 NOTE — Evaluation (Signed)
Physical Therapy Evaluation Patient Details Name: Andrea Norton MRN: [id number] DOB: 11-18-1945 Today's Date: 04/03/2014   History of Present Illness  R TKR rev  Clinical Impression  Pt s/p R TKR revision presents with decreased R LE strength/ROM and post op pain limiting functional mobility.  Pt should progress to d/c home with family assist and HHPT follow up.    Follow Up Recommendations Home health PT    Equipment Recommendations  None recommended by PT    Recommendations for Other Services OT consult     Precautions / Restrictions Precautions Precautions: Knee;Fall Required Braces or Orthoses: Knee Immobilizer - Right Knee Immobilizer - Right: Discontinue once straight leg raise with < 10 degree lag Restrictions Weight Bearing Restrictions: No Other Position/Activity Restrictions: WBAT      Mobility  Bed Mobility Overal bed mobility: Needs Assistance Bed Mobility: Supine to Sit     Supine to sit: Min assist     General bed mobility comments: min assist for R LE; min cues for sequence  Transfers Overall transfer level: Needs assistance Equipment used: Rolling walker (2 wheeled) Transfers: Sit to/from Stand Sit to Stand: Min assist;From elevated surface         General transfer comment: cues for LE management and use of UEs to self assist  Ambulation/Gait Ambulation/Gait assistance: Min assist Ambulation Distance (Feet): 97 Feet Assistive device: Rolling walker (2 wheeled) Gait Pattern/deviations: Step-to pattern;Decreased step length - right;Decreased step length - left;Shuffle;Trunk flexed     General Gait Details: cues for posture, position from RW and initial sequence  Stairs            Wheelchair Mobility    Modified Rankin (Stroke Patients Only)       Balance                                             Pertinent Vitals/Pain Pain Assessment: 0-10 Pain Score: 2  Pain Location: R knee Pain Descriptors /  Indicators: Sore Pain Intervention(s): Limited activity within patient's tolerance;Monitored during session;Premedicated before session;Ice applied    Home Living Family/patient expects to be discharged to:: Private residence Living Arrangements: Spouse/significant other Available Help at Discharge: Family Type of Home: House Home Access: Stairs to enter Entrance Stairs-Rails: Right;Left;Can reach both Secretary/administrator of Steps: 4 Home Layout: One level Home Equipment: Walker - 2 wheels      Prior Function Level of Independence: Independent;Independent with assistive device(s)               Hand Dominance        Extremity/Trunk Assessment   Upper Extremity Assessment: Overall WFL for tasks assessed           Lower Extremity Assessment: RLE deficits/detail RLE Deficits / Details: 3-/5 quads with AAROM at knee -10 - 90    Cervical / Trunk Assessment: Normal  Communication   Communication: No difficulties  Cognition Arousal/Alertness: Awake/alert Behavior During Therapy: WFL for tasks assessed/performed Overall Cognitive Status: Within Functional Limits for tasks assessed                      General Comments      Exercises Total Joint Exercises Ankle Circles/Pumps: AROM;Both;15 reps;Supine Quad Sets: AROM;Both;10 reps;Supine Heel Slides: AAROM;15 reps;Right;Supine Straight Leg Raises: AAROM;Right;15 reps;Supine      Assessment/Plan    PT Assessment Patient needs continued PT  services  PT Diagnosis Difficulty walking   PT Problem List Decreased strength;Decreased range of motion;Decreased activity tolerance;Decreased mobility;Decreased knowledge of use of DME;Obesity;Pain  PT Treatment Interventions DME instruction;Gait training;Stair training;Functional mobility training;Therapeutic activities;Therapeutic exercise;Patient/family education   PT Goals (Current goals can be found in the Care Plan section) Acute Rehab PT Goals Patient  Stated Goal: Be able to straighten my knee all the way PT Goal Formulation: With patient Time For Goal Achievement: 04/10/14 Potential to Achieve Goals: Good    Frequency 7X/week   Barriers to discharge        Co-evaluation               End of Session Equipment Utilized During Treatment: Gait belt;Right knee immobilizer Activity Tolerance: Patient tolerated treatment well Patient left: in chair;with call bell/phone within reach;with family/visitor present Nurse Communication: Mobility status         Time: 1004-1035 PT Time Calculation (min): 31 min   Charges:   PT Evaluation $Initial PT Evaluation Tier I: 1 Procedure PT Treatments $Gait Training: 8-22 mins $Therapeutic Exercise: 8-22 mins   PT G Codes:          Evolett Somarriba 04/03/2014, 1:14 PM

## 2014-04-03 NOTE — Progress Notes (Signed)
OT Cancellation Note  Patient Details Name: CAYLEA FORONDA MRN: [id number] DOB: 10-Jan-1946   Cancelled Treatment:    Reason Eval/Treat Not Completed: Other (comment).  Pt has had multiple knee surgeries.  No OT needs at this time.  Will sign off.  Reta Norgren 04/03/2014, 12:07 PM Marica Otter, OTR/L [phone] 04/03/2014

## 2014-04-03 NOTE — Progress Notes (Signed)
[facility]  [facility] is providing the following services: RW and Commode  If patient discharges after hours, please call [phone].   Renard Hamper 04/03/2014, 10:23 AM

## 2014-04-03 NOTE — Care Management Note (Signed)
    Page 1 of 2   04/03/2014     9:38:41 AM CARE MANAGEMENT NOTE 04/03/2014  Patient:  Andrea Norton, Andrea Norton   Account Number:  [id number]  Date Initiated:  04/03/2014  Documentation initiated by:  [facility]  Subjective/Objective Assessment:   adm: RIGHT KNEE FEMORAL ARTHROPLASTY REVISION (Right)     Action/Plan:   discharge planning   Anticipated DC Date:  04/03/2014   Anticipated DC Plan:  [place]  CM consult      [facility] Choice  HOME HEALTH   Choice offered to / List presented to:  C-1 Patient   DME arranged  3-N-1  Vassie Moselle      DME agency  [place] arranged  [place]   Status of service:  Completed, signed off Medicare Important Message given?   (If response is "NO", the following Medicare IM given date fields will be blank) Date Medicare IM given:   Medicare IM given by:   Date Additional Medicare IM given:   Additional Medicare IM given by:    Discharge Disposition:  [place]  Per UR Regulation:    If discussed at Long Length of Stay Meetings, dates discussed:    Comments:  04/03/14 08:20 CM met wth pt in room to confirm choice of home health agency.  Pt chooses [facility] to render her HHPT. Pt states her walker and commode are very old and requests new DME.  Address and contact information verified with pt. Home phone [phone] is preferred.  Referral texted to [place], Debbie.  DME request texted to [facility] rep, Lecretia.  No other CM needs were communicated.  Mariane Masters,  BSN, CM [phone].

## 2014-04-04 ENCOUNTER — Encounter (HOSPITAL_COMMUNITY): Payer: Self-pay | Admitting: Orthopedic Surgery

## 2014-04-04 LAB — BASIC METABOLIC PANEL
ANION GAP: 12 (ref 5–15)
BUN: 13 mg/dL (ref 6–23)
CHLORIDE: 102 meq/L (ref 96–112)
CO2: 25 mEq/L (ref 19–32)
Calcium: 8.9 mg/dL (ref 8.4–10.5)
Creatinine, Ser: 0.81 mg/dL (ref 0.50–1.10)
GFR calc non Af Amer: 74 mL/min — ABNORMAL LOW (ref >90)
GFR, EST AFRICAN AMERICAN: 86 mL/min — AB (ref >90)
Glucose, Bld: 144 mg/dL — ABNORMAL HIGH (ref 70–99)
Potassium: 4.2 mEq/L (ref 3.7–5.3)
Sodium: 139 mEq/L (ref 137–147)

## 2014-04-04 LAB — GLUCOSE, CAPILLARY
GLUCOSE-CAPILLARY: 127 mg/dL — AB (ref 70–99)
GLUCOSE-CAPILLARY: 115 mg/dL — AB (ref 70–99)

## 2014-04-04 LAB — CBC
HCT: 33.6 % — ABNORMAL LOW (ref 36.0–46.0)
Hemoglobin: 10.7 g/dL — ABNORMAL LOW (ref 12.0–15.0)
MCH: 27 pg (ref 26.0–34.0)
MCHC: 31.8 g/dL (ref 30.0–36.0)
MCV: 84.6 fL (ref 78.0–100.0)
PLATELETS: 286 10*3/uL (ref 150–400)
RBC: 3.97 MIL/uL (ref 3.87–5.11)
RDW: 15.2 % (ref 11.5–15.5)
WBC: 11.5 10*3/uL — AB (ref 4.0–10.5)

## 2014-04-04 MED ORDER — RIVAROXABAN 10 MG PO TABS: 10.0000 mg | ORAL_TABLET | Freq: Every day | ORAL | Status: DC

## 2014-04-04 MED ORDER — OXYCODONE HCL 5 MG PO TABS: 5.0000 mg | ORAL_TABLET | ORAL | Status: DC | PRN

## 2014-04-04 MED ORDER — METHOCARBAMOL 500 MG PO TABS: 500.0000 mg | ORAL_TABLET | Freq: Four times a day (QID) | ORAL | Status: DC | PRN

## 2014-04-04 NOTE — Progress Notes (Signed)
Physical Therapy Treatment Patient Details Name: Andrea Norton MRN: [id number] DOB: 12-04-1945 Today's Date: 04/04/2014    History of Present Illness R TKR rev    PT Comments    Progressing well  Follow Up Recommendations  Home health PT     Equipment Recommendations  None recommended by PT    Recommendations for Other Services OT consult     Precautions / Restrictions Precautions Precautions: Knee;Fall Required Braces or Orthoses: Knee Immobilizer - Right Knee Immobilizer - Right: Discontinue once straight leg raise with < 10 degree lag Restrictions Weight Bearing Restrictions: No Other Position/Activity Restrictions: WBAT    Mobility  Bed Mobility Overal bed mobility: Modified Independent Bed Mobility: Sit to Supine       Sit to supine: Modified independent (Device/Increase time)      Transfers Overall transfer level: Needs assistance Equipment used: Rolling walker (2 wheeled) Transfers: Sit to/from Stand Sit to Stand: Supervision            Ambulation/Gait Ambulation/Gait assistance: Min guard;Supervision Ambulation Distance (Feet): 155 Feet Assistive device: Rolling walker (2 wheeled) Gait Pattern/deviations: Step-to pattern;Trunk flexed;Shuffle     General Gait Details: cues for posture, position from RW and initial sequence   Stairs Stairs: Yes Stairs assistance: Min guard Stair Management: One rail Left;Forwards;With walker;Step to pattern Number of Stairs: 2 General stair comments: pt and spouse demonstrate how they have previously negotiated stairs post op with RW and rail and appear safe   Wheelchair Mobility    Modified Rankin (Stroke Patients Only)       Balance                                    Cognition Arousal/Alertness: Awake/alert Behavior During Therapy: WFL for tasks assessed/performed Overall Cognitive Status: Within Functional Limits for tasks assessed                      Exercises  Total Joint Exercises Ankle Circles/Pumps: AROM;Both;15 reps;Supine Quad Sets: AROM;Supine;20 reps;Both Heel Slides: AAROM;Right;Supine;20 reps Straight Leg Raises: Right;Supine;20 reps;AAROM;AROM Goniometric ROM: AAROM at R knee -10 - 90    General Comments        Pertinent Vitals/Pain Pain Assessment: 0-10 Pain Score: 2  Pain Location: R knee Pain Descriptors / Indicators: Aching Pain Intervention(s): Limited activity within patient's tolerance;Monitored during session;Premedicated before session;Ice applied    Home Living                      Prior Function            PT Goals (current goals can now be found in the care plan section) Acute Rehab PT Goals Patient Stated Goal: Be able to straighten my knee all the way PT Goal Formulation: With patient Time For Goal Achievement: 04/10/14 Potential to Achieve Goals: Good Progress towards PT goals: Progressing toward goals    Frequency  7X/week    PT Plan Current plan remains appropriate    Co-evaluation             End of Session Equipment Utilized During Treatment: Gait belt Activity Tolerance: Patient tolerated treatment well Patient left: in bed;with call bell/phone within reach;with family/visitor present     Time: 4098-1191 PT Time Calculation (min): 33 min  Charges:  $Gait Training: 8-22 mins $Therapeutic Exercise: 8-22 mins  G Codes:      Ericka Marcellus 2014/04/15, 12:23 PM

## 2014-04-04 NOTE — Progress Notes (Signed)
Subjective: 2 Days Post-Op Procedure(s) (LRB): RIGHT KNEE FEMORAL ARTHROPLASTY REVISION (Right) Patient reports pain as mild.   Patient seen in rounds by Dr. Lequita Halt. Patient is well, but has had some minor complaints of pain in the knee, requiring pain medications Patient is ready to go home today after therapy.  Objective: Vital signs in last 24 hours: Temp:  [97.5 F (36.4 C)-98.5 F (36.9 C)] 98 F (36.7 C) (09/11 0537) Pulse Rate:  [73-92] 73 (09/11 0537) Resp:  [14-18] 16 (09/11 0537) BP: (97-130)/(51-68) 130/68 mmHg (09/11 0537) SpO2:  [92 %-100 %] 95 % (09/11 0537)  Intake/Output from previous day:  Intake/Output Summary (Last 24 hours) at 04/04/14 0735 Last data filed at 04/04/14 0538  Gross per 24 hour  Intake 1082.5 ml  Output   3167 ml  Net -2084.5 ml    Labs:  Recent Labs  04/03/14 0500 04/04/14 0432  HGB 10.8* 10.7*    Recent Labs  04/03/14 0500 04/04/14 0432  WBC 10.1 11.5*  RBC 4.09 3.97  HCT 34.3* 33.6*  PLT 245 286    Recent Labs  04/03/14 0853 04/04/14 0432  NA 136* 139  K 4.3 4.2  CL 101 102  CO2 25 25  BUN 11 13  CREATININE 0.68 0.81  GLUCOSE 138* 144*  CALCIUM 8.6 8.9   No results found for this basename: LABPT, INR,  in the last 72 hours  EXAM: General - Patient is Alert, Appropriate and Oriented Extremity - Neurovascular intact Sensation intact distally Dorsiflexion/Plantar flexion intact Incision - clean, dry, no drainage, healing Motor Function - intact, moving foot and toes well on exam.   Assessment/Plan: 2 Days Post-Op Procedure(s) (LRB): RIGHT KNEE FEMORAL ARTHROPLASTY REVISION (Right) Procedure(s) (LRB): RIGHT KNEE FEMORAL ARTHROPLASTY REVISION (Right) Past Medical History  Diagnosis Date  . Hyperlipidemia   . Status post primary angioplasty with coronary stent     DRUG-ELUTING STENT X1  TO PROXIMAL LAD  . Chronic back pain   . Chronic neck pain   . Arthritis     HANDS, KNEES  . DDD (degenerative  disc disease), cervical   . DDD (degenerative disc disease), lumbar   . Radicular pain in left arm OCCASIONAL PAIN SHOOTING DOWN LEFT ARM SECAONDARY TO  CERVICAL DEGENERATION  . Hemorrhoid   . Diverticulitis of colon   . B12 deficiency anemia   . Vitamin D deficiency   . Hypothyroidism   . Synovitis of knee HYPERTROPHIC RIGHT KNEE  . Abnormal stress test 01-22-2009    LOW RISK ADENOSINE NUCLEAR STUDY W/ PROBABLE MILD APICAL THINNING BUT NO ISCHEMIA  . Depression     states well controlled  . GERD (gastroesophageal reflux disease)   . Inguinal hernia, right     with ventral hernia  . Hypertension       PCP Dr Lysbeth Galas    . Diabetes mellitus     ORAL MED - NO INSULIN  . Hemorrhoids   . Coronary artery disease 2005    STRESS TEST /, NOte Dr Kirtland Bouchard EPIC/ no cardiologist now--STATES HEART STENT PLACED 2010  . Sleep apnea     couldnt tolerate machine- states last sleep study " many years ago"  . Bronchitis     hx of   . H/O hiatal hernia    Active Problems:   Failed total knee arthroplasty  Estimated body mass index is 54.31 kg/(m^2) as calculated from the following:   Height as of this encounter:  (1.575 m).   Weight  as of this encounter: 134.718 kg (297 lb). Up with therapy Discharge home with home health Diet - Cardiac diet Follow up - in 2 weeks Activity - WBAT Disposition - Home Condition Upon Discharge - Good D/C Meds - See DC Summary DVT Prophylaxis - Xarelto  Repeat K+ was 4.3 yesterday, 4.2 today.  [name], PA-C Orthopaedic Surgery 04/04/2014, 7:35 AM

## 2014-04-04 NOTE — Plan of Care (Signed)
Problem: Consults Goal: Diagnosis- Total Joint Replacement Outcome: Completed/Met Date Met:  04/04/14 Revision Total Knee RIGHT  Problem: Phase III Progression Outcomes Goal: Anticoagulant follow-up in place Outcome: Not Applicable Date Met:  75/43/60 Xarelto VTE, no f/u needed.

## 2014-04-04 NOTE — Discharge Summary (Signed)
Physician Discharge Summary   Patient ID: Andrea Norton MRN: [id number] DOB/AGE: 1946/07/17 68 y.o.  Admit date: 04/02/2014 Discharge date: 04/04/2014  Primary Diagnosis:  Failed painful right total knee arthroplasty.  Admission Diagnoses:  Past Medical History  Diagnosis Date  . Hyperlipidemia   . Status post primary angioplasty with coronary stent     DRUG-ELUTING STENT X1  TO PROXIMAL LAD  . Chronic back pain   . Chronic neck pain   . Arthritis     HANDS, KNEES  . DDD (degenerative disc disease), cervical   . DDD (degenerative disc disease), lumbar   . Radicular pain in left arm OCCASIONAL PAIN SHOOTING DOWN LEFT ARM SECAONDARY TO  CERVICAL DEGENERATION  . Hemorrhoid   . Diverticulitis of colon   . B12 deficiency anemia   . Vitamin D deficiency   . Hypothyroidism   . Synovitis of knee HYPERTROPHIC RIGHT KNEE  . Abnormal stress test 01-22-2009    LOW RISK ADENOSINE NUCLEAR STUDY W/ PROBABLE MILD APICAL THINNING BUT NO ISCHEMIA  . Depression     states well controlled  . GERD (gastroesophageal reflux disease)   . Inguinal hernia, right     with ventral hernia  . Hypertension       PCP Dr Edrick Oh    . Diabetes mellitus     ORAL MED - NO INSULIN  . Hemorrhoids   . Coronary artery disease 2005    STRESS TEST /, NOte Dr Jenkins [place] EPIC/ no cardiologist now--[place] 2010  . Sleep apnea     couldnt tolerate machine- states last sleep study " many years ago"  . Bronchitis     hx of   . H/O hiatal hernia    Discharge Diagnoses:   Active Problems:   Failed total knee arthroplasty  Estimated body mass index is 54.31 kg/(m^2) as calculated from the following:   Height as of this encounter: 5' 2"  (1.575 m).   Weight as of this encounter: 134.718 kg (297 lb).  Procedure:  Procedure(s) (LRB): RIGHT KNEE FEMORAL ARTHROPLASTY REVISION (Right)   Consults: None  HPI: Andrea Norton is a 68 year old female, long complex  history in regard to her right knee. She  had a revision last year, was  left with a significant flexion contracture and has pain and significant  dysfunction regard to that. She has tried extensive nonoperative  management. It was felt that she potentially needs to undergo femoral  revision in order to elevate the joint line enough to improve her  extension.  Laboratory Data: Admission on 04/02/2014  Component Date Value Ref Range Status  . Glucose-Capillary 04/02/2014 110* 70 - 99 mg/dL Final  . Comment 1 04/02/2014 Documented in Chart   Final  . Glucose-Capillary 04/02/2014 111* 70 - 99 mg/dL Final  . Glucose-Capillary 04/02/2014 144* 70 - 99 mg/dL Final  . Comment 1 04/02/2014 Documented in Chart   Final  . Comment 2 04/02/2014 Notify RN   Final  . WBC 04/03/2014 10.1  4.0 - 10.5 K/uL Final  . RBC 04/03/2014 4.09  3.87 - 5.11 MIL/uL Final  . Hemoglobin 04/03/2014 10.8* 12.0 - 15.0 g/dL Final  . HCT 04/03/2014 34.3* 36.0 - 46.0 % Final  . MCV 04/03/2014 83.9  78.0 - 100.0 fL Final  . MCH 04/03/2014 26.4  26.0 - 34.0 pg Final  . MCHC 04/03/2014 31.5  30.0 - 36.0 g/dL Final  . RDW 04/03/2014 14.8  11.5 - 15.5 % Final  . Platelets 04/03/2014  245  150 - 400 K/uL Final  . Sodium 04/03/2014 139  137 - 147 mEq/L Final  . Potassium 04/03/2014 5.4* 3.7 - 5.3 mEq/L Final  . Chloride 04/03/2014 105  96 - 112 mEq/L Final  . CO2 04/03/2014 26  19 - 32 mEq/L Final  . Glucose, Bld 04/03/2014 179* 70 - 99 mg/dL Final  . BUN 04/03/2014 12  6 - 23 mg/dL Final  . Creatinine, Ser 04/03/2014 0.72  0.50 - 1.10 mg/dL Final  . Calcium 04/03/2014 8.7  8.4 - 10.5 mg/dL Final  . GFR calc non Af Amer 04/03/2014 88* >90 mL/min Final  . GFR calc Af Amer 04/03/2014 >90  >90 mL/min Final   Comment: (NOTE)                          The eGFR has been calculated using the CKD EPI equation.                          This calculation has not been validated in all clinical situations.                          eGFR's persistently <90 mL/min signify possible  Chronic Kidney                          Disease.  . Anion gap 04/03/2014 8  5 - 15 Final  . Glucose-Capillary 04/02/2014 185* 70 - 99 mg/dL Final  . Comment 1 04/02/2014 Notify RN   Final  . Comment 2 04/02/2014 Documented in Chart   Final  . Glucose-Capillary 04/03/2014 131* 70 - 99 mg/dL Final  . Sodium 04/03/2014 136* 137 - 147 mEq/L Final  . Potassium 04/03/2014 4.3  3.7 - 5.3 mEq/L Final   Comment: DELTA CHECK NOTED                          REPEATED TO VERIFY  . Chloride 04/03/2014 101  96 - 112 mEq/L Final  . CO2 04/03/2014 25  19 - 32 mEq/L Final  . Glucose, Bld 04/03/2014 138* 70 - 99 mg/dL Final  . BUN 04/03/2014 11  6 - 23 mg/dL Final  . Creatinine, Ser 04/03/2014 0.68  0.50 - 1.10 mg/dL Final  . Calcium 04/03/2014 8.6  8.4 - 10.5 mg/dL Final  . GFR calc non Af Amer 04/03/2014 89* >90 mL/min Final  . GFR calc Af Amer 04/03/2014 >90  >90 mL/min Final   Comment: (NOTE)                          The eGFR has been calculated using the CKD EPI equation.                          This calculation has not been validated in all clinical situations.                          eGFR's persistently <90 mL/min signify possible Chronic Kidney                          Disease.  . Anion gap 04/03/2014 10  5 - 15 Final  . Glucose-Capillary 04/03/2014 148*  70 - 99 mg/dL Final  . WBC 04/04/2014 11.5* 4.0 - 10.5 K/uL Final  . RBC 04/04/2014 3.97  3.87 - 5.11 MIL/uL Final  . Hemoglobin 04/04/2014 10.7* 12.0 - 15.0 g/dL Final  . HCT 04/04/2014 33.6* 36.0 - 46.0 % Final  . MCV 04/04/2014 84.6  78.0 - 100.0 fL Final  . MCH 04/04/2014 27.0  26.0 - 34.0 pg Final  . MCHC 04/04/2014 31.8  30.0 - 36.0 g/dL Final  . RDW 04/04/2014 15.2  11.5 - 15.5 % Final  . Platelets 04/04/2014 286  150 - 400 K/uL Final  . Sodium 04/04/2014 139  137 - 147 mEq/L Final  . Potassium 04/04/2014 4.2  3.7 - 5.3 mEq/L Final  . Chloride 04/04/2014 102  96 - 112 mEq/L Final  . CO2 04/04/2014 25  19 - 32 mEq/L Final  .  Glucose, Bld 04/04/2014 144* 70 - 99 mg/dL Final  . BUN 04/04/2014 13  6 - 23 mg/dL Final  . Creatinine, Ser 04/04/2014 0.81  0.50 - 1.10 mg/dL Final  . Calcium 04/04/2014 8.9  8.4 - 10.5 mg/dL Final  . GFR calc non Af Amer 04/04/2014 74* >90 mL/min Final  . GFR calc Af Amer 04/04/2014 86* >90 mL/min Final   Comment: (NOTE)                          The eGFR has been calculated using the CKD EPI equation.                          This calculation has not been validated in all clinical situations.                          eGFR's persistently <90 mL/min signify possible Chronic Kidney                          Disease.  . Anion gap 04/04/2014 12  5 - 15 Final  . Glucose-Capillary 04/03/2014 138* 70 - 99 mg/dL Final  . Glucose-Capillary 04/03/2014 170* 70 - 99 mg/dL Final  . Glucose-Capillary 04/04/2014 127* 70 - 99 mg/dL Final  Hospital Outpatient Visit on 03/27/2014  Component Date Value Ref Range Status  . aPTT 03/27/2014 33  24 - 37 seconds Final  . WBC 03/27/2014 7.7  4.0 - 10.5 K/uL Final  . RBC 03/27/2014 4.76  3.87 - 5.11 MIL/uL Final  . Hemoglobin 03/27/2014 12.7  12.0 - 15.0 g/dL Final  . HCT 03/27/2014 39.7  36.0 - 46.0 % Final  . MCV 03/27/2014 83.4  78.0 - 100.0 fL Final  . MCH 03/27/2014 26.7  26.0 - 34.0 pg Final  . MCHC 03/27/2014 32.0  30.0 - 36.0 g/dL Final  . RDW 03/27/2014 15.5  11.5 - 15.5 % Final  . Platelets 03/27/2014 317  150 - 400 K/uL Final  . Sodium 03/27/2014 137  137 - 147 mEq/L Final  . Potassium 03/27/2014 4.3  3.7 - 5.3 mEq/L Final  . Chloride 03/27/2014 99  96 - 112 mEq/L Final  . CO2 03/27/2014 25  19 - 32 mEq/L Final  . Glucose, Bld 03/27/2014 99  70 - 99 mg/dL Final  . BUN 03/27/2014 11  6 - 23 mg/dL Final  . Creatinine, Ser 03/27/2014 0.87  0.50 - 1.10 mg/dL Final  . Calcium 03/27/2014 9.3  8.4 - 10.5 mg/dL  Final  . Total Protein 03/27/2014 7.7  6.0 - 8.3 g/dL Final  . Albumin 03/27/2014 3.3* 3.5 - 5.2 g/dL Final  . AST 03/27/2014 20  0 - 37 U/L  Final   Comment: SLIGHT HEMOLYSIS                          HEMOLYSIS AT THIS LEVEL MAY AFFECT RESULT  . ALT 03/27/2014 17  0 - 35 U/L Final  . Alkaline Phosphatase 03/27/2014 84  39 - 117 U/L Final  . Total Bilirubin 03/27/2014 0.6  0.3 - 1.2 mg/dL Final  . GFR calc non Af Amer 03/27/2014 68* >90 mL/min Final  . GFR calc Af Amer 03/27/2014 79* >90 mL/min Final   Comment: (NOTE)                          The eGFR has been calculated using the CKD EPI equation.                          This calculation has not been validated in all clinical situations.                          eGFR's persistently <90 mL/min signify possible Chronic Kidney                          Disease.  . Anion gap 03/27/2014 13  5 - 15 Final  . Prothrombin Time 03/27/2014 12.7  11.6 - 15.2 seconds Final  . INR 03/27/2014 0.95  0.00 - 1.49 Final  . ABO/RH(D) 03/27/2014 O POS   Final  . Antibody Screen 03/27/2014 NEG   Final  . Sample Expiration 03/27/2014 04/05/2014   Final  . Color, Urine 03/27/2014 YELLOW  YELLOW Final  . APPearance 03/27/2014 CLOUDY* CLEAR Final  . Specific Gravity, Urine 03/27/2014 1.020  1.005 - 1.030 Final  . pH 03/27/2014 6.0  5.0 - 8.0 Final  . Glucose, UA 03/27/2014 NEGATIVE  NEGATIVE mg/dL Final  . Hgb urine dipstick 03/27/2014 NEGATIVE  NEGATIVE Final  . Bilirubin Urine 03/27/2014 NEGATIVE  NEGATIVE Final  . Ketones, ur 03/27/2014 NEGATIVE  NEGATIVE mg/dL Final  . Protein, ur 03/27/2014 NEGATIVE  NEGATIVE mg/dL Final  . Urobilinogen, UA 03/27/2014 1.0  0.0 - 1.0 mg/dL Final  . Nitrite 03/27/2014 NEGATIVE  NEGATIVE Final  . Leukocytes, UA 03/27/2014 MODERATE* NEGATIVE Final  . MRSA, PCR 03/27/2014 INVALID RESULTS, SPECIMEN SENT FOR CULTURE* NEGATIVE Final   AFTER HOURS @1730  ON 03/27/14 BY MCCOY,N.  . Staphylococcus aureus 03/27/2014 INVALID RESULTS, SPECIMEN SENT FOR CULTURE* NEGATIVE Final   Comment:                                 The Xpert SA Assay (FDA                           approved for NASAL specimens                          in patients over 48 years of age),  is one component of                          a comprehensive surveillance                          program.  Test performance has                          been validated by [facility] for patients greater                          than or equal to 34 year old.                          It is not intended                          to diagnose infection nor to                          guide or monitor treatment.                          AFTER HOURS @ 1730 ON 03/27/13 BY MCCOY,N.   . Squamous Epithelial / LPF 03/27/2014 MANY* RARE Final  . WBC, UA 03/27/2014 7-10  <3 WBC/hpf Final  . RBC / HPF 03/27/2014 0-2  <3 RBC/hpf Final  . Bacteria, UA 03/27/2014 MANY* RARE Final  . Specimen Description 03/27/2014 NOSE   Final  . Special Requests 03/27/2014 NONE   Final  . Culture 03/27/2014    Final                   Value:NO STAPHYLOCOCCUS AUREUS ISOLATED                         Note: No MRSA Isolated                         Performed at [facility]  . Report Status 03/27/2014 03/30/2014 FINAL   Final     X-Rays:No results found.  EKG: Orders placed in visit on 01/14/14  . EKG 12-LEAD     Hospital Course: Andrea Norton is a 68 y.o. who was admitted to [facility]. They were brought to the operating room on 04/02/2014 and underwent Procedure(s): RIGHT KNEE [place].  Patient tolerated the procedure well and was later transferred to the recovery room and then to the orthopaedic floor for postoperative care.  They were given PO and IV analgesics for pain control following their surgery.  They were given 24 hours of postoperative antibiotics of  Anti-infectives   Start     Dose/Rate Route Frequency Ordered Stop   04/02/14 2100  ceFAZolin (ANCEF) IVPB 2 g/50 mL premix     2 g 100 mL/hr over 30 Minutes Intravenous Every 6  hours 04/02/14 1822 04/03/14 0228   04/02/14 0600  ceFAZolin (ANCEF) 3 g in dextrose 5 % 50 mL IVPB     3 g 160  mL/hr over 30 Minutes Intravenous On call to O.R. 04/01/14 1321 04/02/14 1430     and started on DVT prophylaxis in the form of Xarelto.   PT and OT were ordered for total joint protocol.  Discharge planning consulted to help with postop disposition and equipment needs.  Patient had a decent night on the evening of surgery but did not get much sleep.  They started to get up OOB with therapy on day one. Hemovac drain was pulled without difficulty.  Potassium was noted to be elevated at 5.4. Likely hemolysis. Drew labs again later that morning and noted to be normal at 4.3. Continued to work with therapy into day two.  Dressing was changed on day two and the incision was healing well. Patient was seen in rounds and was ready to go home as per Dr. Wynelle Link.  Her Potassium was stable at 4.2.  Discharge home with home health  Diet - Cardiac diet  Follow up - in 2 weeks  Activity - WBAT  Disposition - Home  Condition Upon Discharge - Good  D/C Meds - See DC Summary  DVT Prophylaxis - Xarelto       Discharge Instructions   Call MD / Call 911    Complete by:  As directed   If you experience chest pain or shortness of breath, CALL 911 and be transported to the hospital emergency room.  If you develope a fever above 101 F, pus (white drainage) or increased drainage or redness at the wound, or calf pain, call your surgeon's office.     Change dressing    Complete by:  As directed   Change dressing daily with sterile 4 x 4 inch gauze dressing and apply TED hose. Do not submerge the incision under water.     Constipation Prevention    Complete by:  As directed   Drink plenty of fluids.  Prune juice may be helpful.  You may use a stool softener, such as Colace (over the counter) 100 mg twice a day.  Use MiraLax (over the counter) for constipation as needed.     Diet - low sodium heart  healthy    Complete by:  As directed      Diet Carb Modified    Complete by:  As directed      Discharge instructions    Complete by:  As directed   Pick up stool softner and laxative for home. Do not submerge incision under water. May shower. Continue to use ice for pain and swelling from surgery.  Take Xarelto for two and a half more weeks, then discontinue Xarelto. Once the patient has completed the Xarelto, they may resume the 81 mg Aspirin.     Do not put a pillow under the knee. Place it under the heel.    Complete by:  As directed      Do not sit on low chairs, stoools or toilet seats, as it may be difficult to get up from low surfaces    Complete by:  As directed      Driving restrictions    Complete by:  As directed   No driving until released by the physician.     Increase activity slowly as tolerated    Complete by:  As directed      Lifting restrictions    Complete by:  As directed   No lifting until released by the physician.     Patient may shower    Complete by:  As directed   You may shower without a dressing once there is no drainage.  Do not wash over the wound.  If drainage remains, do not shower until drainage stops.     TED hose    Complete by:  As directed   Use stockings (TED hose) for 3 weeks on both leg(s).  You may remove them at night for sleeping.     Weight bearing as tolerated    Complete by:  As directed             Medication List    STOP taking these medications       aspirin EC 81 MG tablet     cyanocobalamin 1000 MCG/ML injection  Commonly known as:  (VITAMIN B-12)     Vitamin D (Ergocalciferol) 50000 UNITS Caps capsule  Commonly known as:  DRISDOL      TAKE these medications       escitalopram 10 MG tablet  Commonly known as:  LEXAPRO  Take 10 mg by mouth every evening.     furosemide 20 MG tablet  Commonly known as:  LASIX  Take 20 mg by mouth at bedtime.     gabapentin 100 MG capsule  Commonly known as:  NEURONTIN    Take 300 mg by mouth 2 (two) times daily.     levothyroxine 125 MCG tablet  Commonly known as:  SYNTHROID, LEVOTHROID  Take 125 mcg by mouth daily before breakfast.     lisinopril 10 MG tablet  Commonly known as:  PRINIVIL,ZESTRIL  Take 10 mg by mouth daily with lunch.     metFORMIN 500 MG (MOD) 24 hr tablet  Commonly known as:  GLUMETZA  Take 500 mg by mouth daily with breakfast.     methocarbamol 500 MG tablet  Commonly known as:  ROBAXIN  Take 1 tablet (500 mg total) by mouth every 6 (six) hours as needed for muscle spasms.     metoprolol succinate 25 MG 24 hr tablet  Commonly known as:  TOPROL-XL  Take 25 mg by mouth daily with lunch.     oxyCODONE 5 MG immediate release tablet  Commonly known as:  Oxy IR/ROXICODONE  Take 1-2 tablets (5-10 mg total) by mouth every 3 (three) hours as needed for moderate pain, severe pain or breakthrough pain.     pantoprazole 40 MG tablet  Commonly known as:  PROTONIX  Take 40 mg by mouth at bedtime.     potassium chloride SA 20 MEQ tablet  Commonly known as:  K-DUR,KLOR-CON  Take 20 mEq by mouth daily with breakfast.     rivaroxaban 10 MG Tabs tablet  Commonly known as:  XARELTO  - Take 1 tablet (10 mg total) by mouth daily with breakfast. Take Xarelto for two and a half more weeks, then discontinue Xarelto.  - Once the patient has completed the Xarelto, they may resume the 81 mg Aspirin.     simvastatin 40 MG tablet  Commonly known as:  ZOCOR  Take 40 mg by mouth every morning.       Follow-up Information   Follow up with [facility] (home health physical therapy)    Contact information:   [address]  [place] [address] [phone]       Follow up with [facility] (3n1 (commode) and rolling walker)    Contact information:   [address] [place] [address] [phone]  Follow up with Gearlean Alf, MD. Schedule an appointment as soon as possible for a  visit on 04/15/2014. (Call office at [phone] to set up appointment of 04/15/2014.)    Specialty:  Orthopedic Surgery   Contact information:   [address] [place] [address] [phone]       Signed: Arlee Muslim, PA-C Orthopaedic Surgery 04/04/2014, 7:47 AM

## 2014-04-15 ENCOUNTER — Ambulatory Visit: Payer: Medicare Other | Admitting: Dietician

## 2014-04-21 ENCOUNTER — Ambulatory Visit: Payer: Medicare Other | Attending: Orthopedic Surgery | Admitting: Physical Therapy

## 2014-04-21 ENCOUNTER — Encounter: Payer: Medicare Other | Attending: Surgery | Admitting: Dietician

## 2014-04-21 ENCOUNTER — Ambulatory Visit: Payer: Medicare Other | Admitting: Dietician

## 2014-04-21 DIAGNOSIS — M25569 Pain in unspecified knee: Secondary | ICD-10-CM | POA: Diagnosis not present

## 2014-04-21 DIAGNOSIS — R5381 Other malaise: Secondary | ICD-10-CM | POA: Diagnosis not present

## 2014-04-21 DIAGNOSIS — IMO0001 Reserved for inherently not codable concepts without codable children: Secondary | ICD-10-CM | POA: Insufficient documentation

## 2014-04-21 DIAGNOSIS — M25669 Stiffness of unspecified knee, not elsewhere classified: Secondary | ICD-10-CM | POA: Insufficient documentation

## 2014-04-21 DIAGNOSIS — Z6841 Body Mass Index (BMI) 40.0 and over, adult: Secondary | ICD-10-CM | POA: Insufficient documentation

## 2014-04-21 DIAGNOSIS — R269 Unspecified abnormalities of gait and mobility: Secondary | ICD-10-CM | POA: Diagnosis not present

## 2014-04-21 DIAGNOSIS — Z713 Dietary counseling and surveillance: Secondary | ICD-10-CM | POA: Insufficient documentation

## 2014-04-21 DIAGNOSIS — E119 Type 2 diabetes mellitus without complications: Secondary | ICD-10-CM | POA: Insufficient documentation

## 2014-04-21 NOTE — Patient Instructions (Addendum)
-  Try Premier protein shakes  -EAS AdvantEdge and Atkins shakes are also appropriate

## 2014-04-21 NOTE — Progress Notes (Signed)
  Medical Nutrition Therapy:  Appt start time: 1200 end time:  1215.  SWL visit #4:  Primary concerns today:  Andrea Norton returns today for her 4th SWL weight loss visit in preparation for gastric sleeve. She is 2.5 weeks post op knee surgery and starts physical therapy today. Does not have much of an appetite but not skipping meals. She reports drinking chocolate [place] smoothies. Practicing pre op goals like chewing thoroughly and not drinking while eating. Andrea Norton reports that she is having stomach issues from pain medicine and hernias.      Wt Readings from Last 3 Encounters:  04/21/14 293 lb 12.8 oz (133.267 kg)  04/02/14 297 lb (134.718 kg)  04/02/14 297 lb (134.718 kg)   Ht Readings from Last 3 Encounters:  04/02/14  (1.575 m)  04/02/14  (1.575 m)  03/27/14  (1.575 m)   Body mass index is 53.72 kg/(m^2). @ Normalized weight-for-age data available only for age 33 to 20 years. Normalized stature-for-age data available only for age 33 to 20 years.   MEDICATIONS: see list  Recent physical activity: not able to do any physical activity; walks in the pool some  Estimated energy needs: 1600 calories  Progress Towards Goal(s):  In progress.   Nutritional Diagnosis:  [place]-3.3 Overweight/obesity related to past poor dietary habits and physical inactivity as evidenced by patient w/ pending gastric sleeve surgery following dietary guidelines for continued weight loss.     Intervention:  Nutrition counseling provided. Discussed 2 week post op liquid phase.  Sample given and patient instructed on proper use: Premier protein shake (chocolate - qty 1) Lot#: [id number] Exp: 03/2015  Monitoring/Evaluation:  Dietary intake, exercise, and body weight in 4 week(s).

## 2014-04-23 ENCOUNTER — Ambulatory Visit: Payer: Medicare Other | Admitting: Physical Therapy

## 2014-04-23 DIAGNOSIS — IMO0001 Reserved for inherently not codable concepts without codable children: Secondary | ICD-10-CM | POA: Diagnosis not present

## 2014-04-24 ENCOUNTER — Ambulatory Visit: Payer: Medicare Other | Attending: Orthopedic Surgery | Admitting: Physical Therapy

## 2014-04-24 ENCOUNTER — Encounter (HOSPITAL_COMMUNITY): Payer: Medicare Other | Admitting: Pharmacy Technician

## 2014-04-24 DIAGNOSIS — R5381 Other malaise: Secondary | ICD-10-CM | POA: Insufficient documentation

## 2014-04-24 DIAGNOSIS — M25561 Pain in right knee: Secondary | ICD-10-CM | POA: Insufficient documentation

## 2014-04-24 DIAGNOSIS — Z5189 Encounter for other specified aftercare: Secondary | ICD-10-CM | POA: Insufficient documentation

## 2014-04-24 DIAGNOSIS — M25661 Stiffness of right knee, not elsewhere classified: Secondary | ICD-10-CM | POA: Diagnosis not present

## 2014-04-24 DIAGNOSIS — R269 Unspecified abnormalities of gait and mobility: Secondary | ICD-10-CM | POA: Diagnosis not present

## 2014-04-28 ENCOUNTER — Ambulatory Visit: Payer: Medicare Other | Admitting: Physical Therapy

## 2014-04-28 DIAGNOSIS — Z5189 Encounter for other specified aftercare: Secondary | ICD-10-CM | POA: Diagnosis not present

## 2014-04-29 ENCOUNTER — Ambulatory Visit: Payer: Medicare Other | Admitting: *Deleted

## 2014-04-29 DIAGNOSIS — Z5189 Encounter for other specified aftercare: Secondary | ICD-10-CM | POA: Diagnosis not present

## 2014-04-30 ENCOUNTER — Encounter (HOSPITAL_COMMUNITY)
Admission: RE | Admit: 2014-04-30 | Discharge: 2014-04-30 | Disposition: A | Discharge status: Home / Self Care | Payer: Medicare Other | Source: Ambulatory Visit | Attending: Ophthalmology | Admitting: Ophthalmology

## 2014-04-30 ENCOUNTER — Encounter: Payer: Medicare Other | Admitting: Physical Therapy

## 2014-04-30 ENCOUNTER — Encounter (HOSPITAL_COMMUNITY): Payer: Self-pay

## 2014-04-30 DIAGNOSIS — F329 Major depressive disorder, single episode, unspecified: Secondary | ICD-10-CM | POA: Insufficient documentation

## 2014-04-30 DIAGNOSIS — H268 Other specified cataract: Secondary | ICD-10-CM | POA: Diagnosis not present

## 2014-04-30 DIAGNOSIS — K219 Gastro-esophageal reflux disease without esophagitis: Secondary | ICD-10-CM | POA: Insufficient documentation

## 2014-04-30 DIAGNOSIS — E119 Type 2 diabetes mellitus without complications: Secondary | ICD-10-CM | POA: Insufficient documentation

## 2014-04-30 DIAGNOSIS — Z0000 Encounter for general adult medical examination without abnormal findings: Secondary | ICD-10-CM | POA: Diagnosis present

## 2014-04-30 DIAGNOSIS — M1389 Other specified arthritis, multiple sites: Secondary | ICD-10-CM | POA: Diagnosis not present

## 2014-04-30 DIAGNOSIS — I10 Essential (primary) hypertension: Secondary | ICD-10-CM | POA: Diagnosis not present

## 2014-04-30 DIAGNOSIS — G4730 Sleep apnea, unspecified: Secondary | ICD-10-CM | POA: Diagnosis not present

## 2014-04-30 DIAGNOSIS — E785 Hyperlipidemia, unspecified: Secondary | ICD-10-CM | POA: Diagnosis not present

## 2014-04-30 DIAGNOSIS — M5136 Other intervertebral disc degeneration, lumbar region: Secondary | ICD-10-CM | POA: Insufficient documentation

## 2014-04-30 DIAGNOSIS — I2510 Atherosclerotic heart disease of native coronary artery without angina pectoris: Secondary | ICD-10-CM | POA: Diagnosis not present

## 2014-04-30 DIAGNOSIS — M5030 Other cervical disc degeneration, unspecified cervical region: Secondary | ICD-10-CM | POA: Insufficient documentation

## 2014-04-30 DIAGNOSIS — E039 Hypothyroidism, unspecified: Secondary | ICD-10-CM | POA: Insufficient documentation

## 2014-04-30 DIAGNOSIS — D519 Vitamin B12 deficiency anemia, unspecified: Secondary | ICD-10-CM | POA: Insufficient documentation

## 2014-04-30 LAB — HEMOGLOBIN AND HEMATOCRIT, BLOOD
HEMATOCRIT: 35.4 % — AB (ref 36.0–46.0)
Hemoglobin: 11.2 g/dL — ABNORMAL LOW (ref 12.0–15.0)

## 2014-04-30 LAB — BASIC METABOLIC PANEL
Anion gap: 11 (ref 5–15)
BUN: 17 mg/dL (ref 6–23)
CO2: 27 mEq/L (ref 19–32)
Calcium: 9 mg/dL (ref 8.4–10.5)
Chloride: 103 mEq/L (ref 96–112)
Creatinine, Ser: 0.84 mg/dL (ref 0.50–1.10)
GFR calc Af Amer: 82 mL/min — ABNORMAL LOW (ref >90)
GFR calc non Af Amer: 71 mL/min — ABNORMAL LOW (ref >90)
GLUCOSE: 114 mg/dL — AB (ref 70–99)
POTASSIUM: 4.2 meq/L (ref 3.7–5.3)
Sodium: 141 mEq/L (ref 137–147)

## 2014-04-30 NOTE — Patient Instructions (Signed)
Andrea Norton  04/30/2014   Your procedure is scheduled on:  05/06/14  Report to [facility] at 7:00 AM.  Call this number if you have problems the morning of surgery: [phone]   Remember:   Do not eat food or drink liquids after midnight.   Take these medicines the morning of surgery with A SIP OF WATER:   Oxycodone, Gabapentin, Synthroid, Lisinopril, Metoprolol, Robaxin, Protonix     DO NOT TAKE METFORMIN MORNING OF SURGERY   Do not wear jewelry, make-up or nail polish.  Do not wear lotions, powders, or perfumes. You may wear deodorant.  Do not shave 48 hours prior to surgery. Men may shave face and neck.  Do not bring valuables to the hospital.  [facility] is not responsible for any belongings or valuables.               Contacts, dentures or bridgework may not be worn into surgery.  Leave suitcase in the car. After surgery it may be brought to your room.  For patients admitted to the hospital, discharge time is determined by your treatment team.               Patients discharged the day of surgery will not be allowed to drive home.  Name and phone number of your driver:   Special Instructions: N/A   Please read over the following fact sheets that you were given: Anesthesia Post-op Instructions   PATIENT INSTRUCTIONS POST-ANESTHESIA  IMMEDIATELY FOLLOWING SURGERY:  Do not drive or operate machinery for the first twenty four hours after surgery.  Do not make any important decisions for twenty four hours after surgery or while taking narcotic pain medications or sedatives.  If you develop intractable nausea and vomiting or a severe headache please notify your doctor immediately.  FOLLOW-UP:  Please make an appointment with your surgeon as instructed. You do not need to follow up with anesthesia unless specifically instructed to do so.  WOUND CARE INSTRUCTIONS (if applicable):  Keep a dry clean dressing on the anesthesia/puncture wound site if there is drainage.  Once the  wound has quit draining you may leave it open to air.  Generally you should leave the bandage intact for twenty four hours unless there is drainage.  If the epidural site drains for more than 36-48 hours please call the anesthesia department.  QUESTIONS?:  Please feel free to call your physician or the hospital operator if you have any questions, and they will be happy to assist you.      Cataract A cataract is a clouding of the lens of the eye. When a lens becomes cloudy, vision is reduced based on the degree and nature of the clouding. Many cataracts reduce vision to some degree. Some cataracts make people more near-sighted as they develop. Other cataracts increase glare. Cataracts that are ignored and become worse can sometimes look white. The white color can be seen through the pupil. CAUSES   Aging. However, cataracts may occur at any age, even in newborns.  Certain drugs.  Trauma to the eye.  Certain diseases such as diabetes.  Specific eye diseases such as chronic inflammation inside the eye or a sudden attack of a rare form of glaucoma.  Inherited or acquired medical problems. SYMPTOMS   Gradual, progressive drop in vision in the affected eye.  Severe, rapid visual loss. This most often happens when trauma is the cause. DIAGNOSIS  To detect a cataract, an eye doctor examines the lens.  Cataracts are best diagnosed with an exam of the eyes with the pupils enlarged (dilated) by drops.  TREATMENT  For an early cataract, vision may improve by using different eyeglasses or stronger lighting. If that does not help your vision, surgery is the only effective treatment. A cataract needs to be surgically removed when vision loss interferes with your everyday activities, such as driving, reading, or watching TV. A cataract may also have to be removed if it prevents examination or treatment of another eye problem. Surgery removes the cloudy lens and usually replaces it with a substitute lens  (intraocular lens, IOL).  At a time when both you and your doctor agree, the cataract will be surgically removed. If you have cataracts in both eyes, only one is usually removed at a time. This allows the operated eye to heal and be out of danger from any possible problems after surgery (such as infection or poor wound healing). In rare cases, a cataract may be doing damage to your eye. In these cases, your caregiver may advise surgical removal right away. The vast majority of people who have cataract surgery have better vision afterward. HOME CARE INSTRUCTIONS  If you are not planning surgery, you may be asked to do the following:  Use different eyeglasses.  Use stronger or brighter lighting.  Ask your eye doctor about reducing your medicine dose or changing medicines if it is thought that a medicine caused your cataract. Changing medicines does not make the cataract go away on its own.  Become familiar with your surroundings. Poor vision can lead to injury. Avoid bumping into things on the affected side. You are at a higher risk for tripping or falling.  Exercise extreme care when driving or operating machinery.  Wear sunglasses if you are sensitive to bright light or experiencing problems with glare. SEEK IMMEDIATE MEDICAL CARE IF:   You have a worsening or sudden vision loss.  You notice redness, swelling, or increasing pain in the eye.  You have a fever. Document Released: 07/11/2005 Document Revised: 10/03/2011 Document Reviewed: 03/04/2011 [facility] Patient Information 2015 [place]. This information is not intended to replace advice given to you by your health care provider. Make sure you discuss any questions you have with your health care provider.

## 2014-05-01 ENCOUNTER — Encounter: Payer: Medicare Other | Admitting: Physical Therapy

## 2014-05-02 ENCOUNTER — Ambulatory Visit: Payer: Medicare Other | Admitting: *Deleted

## 2014-05-02 DIAGNOSIS — Z5189 Encounter for other specified aftercare: Secondary | ICD-10-CM | POA: Diagnosis not present

## 2014-05-05 ENCOUNTER — Encounter: Payer: Medicare Other | Admitting: Physical Therapy

## 2014-05-05 MED ORDER — PHENYLEPHRINE HCL 2.5 % OP SOLN
OPHTHALMIC | Status: AC
  Filled 2014-05-05: qty 15

## 2014-05-05 MED ORDER — KETOROLAC TROMETHAMINE 0.5 % OP SOLN
OPHTHALMIC | Status: AC
  Filled 2014-05-05: qty 5

## 2014-05-05 MED ORDER — TETRACAINE HCL 0.5 % OP SOLN
OPHTHALMIC | Status: AC
  Filled 2014-05-05: qty 2

## 2014-05-05 MED ORDER — CYCLOPENTOLATE-PHENYLEPHRINE OP SOLN OPTIME - NO CHARGE
OPHTHALMIC | Status: AC
  Filled 2014-05-05: qty 2

## 2014-05-06 ENCOUNTER — Encounter (HOSPITAL_COMMUNITY)
Admission: RE | Disposition: A | Discharge status: Home / Self Care | Payer: Self-pay | Source: Ambulatory Visit | Attending: Ophthalmology

## 2014-05-06 ENCOUNTER — Ambulatory Visit (HOSPITAL_COMMUNITY)
Admission: RE | Admit: 2014-05-06 | Discharge: 2014-05-06 | Disposition: A | Discharge status: Home / Self Care | Payer: Medicare Other | Source: Ambulatory Visit | Attending: Ophthalmology | Admitting: Ophthalmology

## 2014-05-06 ENCOUNTER — Ambulatory Visit (HOSPITAL_COMMUNITY): Payer: Medicare Other | Admitting: Anesthesiology

## 2014-05-06 ENCOUNTER — Encounter (HOSPITAL_COMMUNITY): Payer: Medicare Other | Admitting: Anesthesiology

## 2014-05-06 DIAGNOSIS — H2511 Age-related nuclear cataract, right eye: Secondary | ICD-10-CM | POA: Diagnosis present

## 2014-05-06 DIAGNOSIS — I2510 Atherosclerotic heart disease of native coronary artery without angina pectoris: Secondary | ICD-10-CM | POA: Insufficient documentation

## 2014-05-06 DIAGNOSIS — I10 Essential (primary) hypertension: Secondary | ICD-10-CM | POA: Diagnosis not present

## 2014-05-06 DIAGNOSIS — F329 Major depressive disorder, single episode, unspecified: Secondary | ICD-10-CM | POA: Diagnosis not present

## 2014-05-06 DIAGNOSIS — M1990 Unspecified osteoarthritis, unspecified site: Secondary | ICD-10-CM | POA: Diagnosis not present

## 2014-05-06 DIAGNOSIS — K219 Gastro-esophageal reflux disease without esophagitis: Secondary | ICD-10-CM | POA: Diagnosis not present

## 2014-05-06 DIAGNOSIS — G4730 Sleep apnea, unspecified: Secondary | ICD-10-CM | POA: Diagnosis not present

## 2014-05-06 DIAGNOSIS — K449 Diaphragmatic hernia without obstruction or gangrene: Secondary | ICD-10-CM | POA: Diagnosis not present

## 2014-05-06 DIAGNOSIS — Z87891 Personal history of nicotine dependence: Secondary | ICD-10-CM | POA: Insufficient documentation

## 2014-05-06 DIAGNOSIS — E119 Type 2 diabetes mellitus without complications: Secondary | ICD-10-CM | POA: Diagnosis not present

## 2014-05-06 HISTORY — PX: CATARACT EXTRACTION W/PHACO: SHX586

## 2014-05-06 SURGERY — PHACOEMULSIFICATION, CATARACT, WITH IOL INSERTION
Anesthesia: Monitor Anesthesia Care | Laterality: Right

## 2014-05-06 MED ORDER — FENTANYL CITRATE 0.05 MG/ML IJ SOLN
INTRAMUSCULAR | Status: AC
  Filled 2014-05-06: qty 2

## 2014-05-06 MED ORDER — BSS IO SOLN
INTRAOCULAR | Status: DC | PRN
  Administered 2014-05-06: 15 mL

## 2014-05-06 MED ORDER — MIDAZOLAM HCL 2 MG/2ML IJ SOLN
INTRAMUSCULAR | Status: AC
  Filled 2014-05-06: qty 2

## 2014-05-06 MED ORDER — KETOROLAC TROMETHAMINE 0.5 % OP SOLN: 1.0000 [drp] | OPHTHALMIC | Status: DC

## 2014-05-06 MED ORDER — PHENYLEPHRINE HCL 2.5 % OP SOLN
1.0000 [drp] | OPHTHALMIC | Status: AC
  Administered 2014-05-06 (×3): 1 [drp] via OPHTHALMIC

## 2014-05-06 MED ORDER — POVIDONE-IODINE 5 % OP SOLN
OPHTHALMIC | Status: DC | PRN
  Administered 2014-05-06: 1 via OPHTHALMIC

## 2014-05-06 MED ORDER — TETRACAINE HCL 0.5 % OP SOLN
1.0000 [drp] | OPHTHALMIC | Status: AC
  Administered 2014-05-06 (×3): 1 [drp] via OPHTHALMIC

## 2014-05-06 MED ORDER — FENTANYL CITRATE 0.05 MG/ML IJ SOLN
25.0000 ug | INTRAMUSCULAR | Status: AC
  Administered 2014-05-06 (×2): 25 ug via INTRAVENOUS

## 2014-05-06 MED ORDER — EPINEPHRINE HCL 1 MG/ML IJ SOLN
INTRAOCULAR | Status: DC | PRN
  Administered 2014-05-06: 09:00:00

## 2014-05-06 MED ORDER — LACTATED RINGERS IV SOLN
INTRAVENOUS | Status: DC
  Administered 2014-05-06: 08:00:00 via INTRAVENOUS

## 2014-05-06 MED ORDER — CYCLOPENTOLATE-PHENYLEPHRINE 0.2-1 % OP SOLN
1.0000 [drp] | OPHTHALMIC | Status: AC
  Administered 2014-05-06 (×3): 1 [drp] via OPHTHALMIC

## 2014-05-06 MED ORDER — MIDAZOLAM HCL 2 MG/2ML IJ SOLN
1.0000 mg | INTRAMUSCULAR | Status: DC | PRN
  Administered 2014-05-06: 2 mg via INTRAVENOUS

## 2014-05-06 MED ORDER — EPINEPHRINE HCL 1 MG/ML IJ SOLN
INTRAMUSCULAR | Status: AC
  Filled 2014-05-06: qty 1

## 2014-05-06 MED ORDER — PROVISC 10 MG/ML IO SOLN
INTRAOCULAR | Status: DC | PRN
  Administered 2014-05-06: 0.85 mL via INTRAOCULAR

## 2014-05-06 SURGICAL SUPPLY — 26 items
CAPSULAR TENSION RING-AMO (OPHTHALMIC RELATED) IMPLANT
CLOTH BEACON ORANGE TIMEOUT ST (SAFETY) ×1 IMPLANT
EYE SHIELD UNIVERSAL CLEAR (GAUZE/BANDAGES/DRESSINGS) ×1 IMPLANT
GLOVE BIO SURGEON STRL SZ 6.5 (GLOVE) IMPLANT
GLOVE BIOGEL PI IND STRL 7.0 (GLOVE) IMPLANT
GLOVE BIOGEL PI INDICATOR 7.0 (GLOVE) ×2
GLOVE ECLIPSE 6.5 STRL STRAW (GLOVE) IMPLANT
GLOVE ECLIPSE 7.0 STRL STRAW (GLOVE) IMPLANT
GLOVE EXAM NITRILE LRG STRL (GLOVE) IMPLANT
GLOVE EXAM NITRILE MD LF STRL (GLOVE) IMPLANT
GLOVE SKINSENSE NS SZ6.5 (GLOVE)
GLOVE SKINSENSE STRL SZ6.5 (GLOVE) IMPLANT
HEALON 5 0.6 ML (INTRAOCULAR LENS) IMPLANT
KIT VITRECTOMY (OPHTHALMIC RELATED) IMPLANT
PAD ARMBOARD 7.5X6 YLW CONV (MISCELLANEOUS) ×1 IMPLANT
PROC W NO LENS (INTRAOCULAR LENS)
PROC W SPEC LENS (INTRAOCULAR LENS)
PROCESS W NO LENS (INTRAOCULAR LENS) IMPLANT
PROCESS W SPEC LENS (INTRAOCULAR LENS) IMPLANT
RETRACTOR IRIS SIGHTPATH (OPHTHALMIC RELATED) IMPLANT
RING MALYGIN (MISCELLANEOUS) IMPLANT
SIGHTPATH CAT PROC W REG LENS (Ophthalmic Related) ×2 IMPLANT
TAPE SURG TRANSPORE 1 IN (GAUZE/BANDAGES/DRESSINGS) IMPLANT
TAPE SURGICAL TRANSPORE 1 IN (GAUZE/BANDAGES/DRESSINGS) ×1
VISCOELASTIC ADDITIONAL (OPHTHALMIC RELATED) IMPLANT
WATER STERILE IRR 250ML POUR (IV SOLUTION) ×1 IMPLANT

## 2014-05-06 NOTE — Anesthesia Procedure Notes (Signed)
Procedure Name: MAC Date/Time: 05/06/2014 8:29 AM Performed by: Franco NonesYATES, Jonnell Hentges S Pre-anesthesia Checklist: Patient identified, Emergency Drugs available, Suction available, Timeout performed and Patient being monitored Patient Re-evaluated:Patient Re-evaluated prior to inductionOxygen Delivery Method: Nasal Cannula

## 2014-05-06 NOTE — H&P (Signed)
The patient was re examined and there is no change in the patients condition since the original H and P. 

## 2014-05-06 NOTE — Discharge Instructions (Signed)
Andrea Norton  05/06/2014           [facility] Instructions [address]- [place] [id number] [address]      1. Avoid closing eyes tightly. One often closes the eye tightly when laughing, talking, sneezing, coughing or if they feel irritated. At these times, you should be careful not to close your eyes tightly.  2. Instill eye drops as instructed. To instill drops in your eye, open it, look up and have someone gently pull the lower lid down and instill a couple of drops inside the lower lid.  3. Do not touch upper lid.  4. Take Advil or Tylenol for pain.  5. You may use either eye for near work, such as reading or sewing and you may watch television.  6. You may have your hair done at the beauty parlor at any time.  7. Wear dark glasses with or without your own glasses if you are in bright light.  8. Call our office at [phone] or [phone] if you have sharp pain in your eye or unusual symptoms.  9. Do not be concerned because vision in the operative eye is not good. It will not be good, no matter how successful the operation, until you get a special lens for it. Your old glasses will not be suited to the new eye that was operated on and you will not be ready for a new lens for about a month.  10. [facility].    I have received a copy of the above instructions and will follow them.

## 2014-05-06 NOTE — Anesthesia Preprocedure Evaluation (Signed)
Anesthesia Evaluation  Patient identified by MRN, date of birth, ID band Patient awake    Reviewed: Allergy & Precautions, H&P , NPO status , Patient's Chart, lab work & pertinent test results, reviewed documented beta blocker date and time   Airway Mallampati: III TM Distance: >3 FB Neck ROM: Full    Dental  (+) Teeth Intact   Pulmonary shortness of breath, sleep apnea , former smoker,  breath sounds clear to auscultation        Cardiovascular hypertension, Pt. on home beta blockers and Pt. on medications + CAD and + Cardiac Stents Rhythm:Regular Rate:Normal     Neuro/Psych PSYCHIATRIC DISORDERS Depression DDD - lumbar & cervical    GI/Hepatic hiatal hernia, GERD-  ,  Endo/Other  diabetes, Type 2, Oral Hypoglycemic Agents  Renal/GU      Musculoskeletal  (+) Arthritis -,   Abdominal   Peds  Hematology  (+) anemia ,   Anesthesia Other Findings   Reproductive/Obstetrics                           Anesthesia Physical Anesthesia Plan  ASA: III  Anesthesia Plan: MAC   Post-op Pain Management:    Induction: Intravenous  Airway Management Planned: Nasal Cannula  Additional Equipment:   Intra-op Plan:   Post-operative Plan:   Informed Consent: I have reviewed the patients History and Physical, chart, labs and discussed the procedure including the risks, benefits and alternatives for the proposed anesthesia with the patient or authorized representative who has indicated his/her understanding and acceptance.     Plan Discussed with:   Anesthesia Plan Comments:         Anesthesia Quick Evaluation

## 2014-05-06 NOTE — Transfer of Care (Signed)
Immediate Anesthesia Transfer of Care Note  Patient: Andrea Norton  Procedure(s) Performed: Procedure(s) (LRB): CATARACT EXTRACTION PHACO AND INTRAOCULAR LENS PLACEMENT (IOC) (Right)  Patient Location: Shortstay  Anesthesia Type: MAC  Level of Consciousness: awake  Airway & Oxygen Therapy: Patient Spontanous Breathing   Post-op Assessment: Report given to PACU RN, Post -op Vital signs reviewed and stable and Patient moving all extremities  Post vital signs: Reviewed and stable  Complications: No apparent anesthesia complications

## 2014-05-06 NOTE — Anesthesia Postprocedure Evaluation (Signed)
  Anesthesia Post-op Note  Patient: Andrea NgGloria O Norton  Procedure(s) Performed: Procedure(s) (LRB): CATARACT EXTRACTION PHACO AND INTRAOCULAR LENS PLACEMENT (IOC) (Right)  Patient Location:  Short Stay  Anesthesia Type: MAC  Level of Consciousness: awake  Airway and Oxygen Therapy: Patient Spontanous Breathing  Post-op Pain: none  Post-op Assessment: Post-op Vital signs reviewed, Patient's Cardiovascular Status Stable, Respiratory Function Stable, Patent Airway, No signs of Nausea or vomiting and Pain level controlled  Post-op Vital Signs: Reviewed and stable  Complications: No apparent anesthesia complications

## 2014-05-06 NOTE — Op Note (Signed)
Patient brought to the operating room and prepped and draped in the usual manner.  Lid speculum inserted in right eye.  Stab incision made at the twelve o'clock position.  Provisc instilled in the anterior chamber.   A 2.4 mm. Stab incision was made temporally.  An anterior capsulotomy was done with a bent 25 gauge needle.  The nucleus was hydrodissected.  The Phaco tip was inserted in the anterior chamber and the nucleus was emulsified.  CDE was 6.41.  The cortical material was then removed with the I and A tip.  Posterior capsule was the polished.  The anterior chamber was deepened with Provisc.  A 24.0 Diopter Rayner [id number] IOL was then inserted in the capsular bag.  Provisc was then removed with the I and A tip.  The wound was then hydrated.  Patient sent to the Recovery Room in good condition with follow up in my office.  Preoperative Diagnosis:  Nuclear Cataract OD Postoperative Diagnosis:  Same Procedure name: Kelman Phacoemulsification OD with IOL

## 2014-05-07 ENCOUNTER — Encounter (HOSPITAL_COMMUNITY): Payer: Self-pay | Admitting: Ophthalmology

## 2014-05-07 LAB — GLUCOSE, CAPILLARY: Glucose-Capillary: 103 mg/dL — ABNORMAL HIGH (ref 70–99)

## 2014-05-08 ENCOUNTER — Ambulatory Visit: Payer: Medicare Other | Admitting: Physical Therapy

## 2014-05-08 ENCOUNTER — Encounter (HOSPITAL_COMMUNITY): Payer: Self-pay | Admitting: Orthopedic Surgery

## 2014-05-08 DIAGNOSIS — Z5189 Encounter for other specified aftercare: Secondary | ICD-10-CM | POA: Diagnosis not present

## 2014-05-12 ENCOUNTER — Ambulatory Visit: Payer: Medicare Other | Admitting: Physical Therapy

## 2014-05-12 DIAGNOSIS — Z5189 Encounter for other specified aftercare: Secondary | ICD-10-CM | POA: Diagnosis not present

## 2014-05-14 ENCOUNTER — Ambulatory Visit: Payer: Medicare Other | Admitting: Physical Therapy

## 2014-05-14 DIAGNOSIS — Z5189 Encounter for other specified aftercare: Secondary | ICD-10-CM | POA: Diagnosis not present

## 2014-05-15 ENCOUNTER — Encounter (HOSPITAL_COMMUNITY): Payer: Self-pay

## 2014-05-15 ENCOUNTER — Encounter (HOSPITAL_COMMUNITY)
Admission: RE | Admit: 2014-05-15 | Discharge: 2014-05-15 | Disposition: A | Discharge status: Home / Self Care | Payer: Medicare Other | Source: Ambulatory Visit | Attending: Ophthalmology | Admitting: Ophthalmology

## 2014-05-15 MED ORDER — FENTANYL CITRATE 0.05 MG/ML IJ SOLN
25.0000 ug | INTRAMUSCULAR | Status: DC | PRN
Start: 2014-05-15 — End: 2014-05-16

## 2014-05-15 MED ORDER — ONDANSETRON HCL 4 MG/2ML IJ SOLN: 4.0000 mg | Freq: Once | INTRAMUSCULAR | Status: AC | PRN

## 2014-05-16 ENCOUNTER — Encounter: Payer: Medicare Other | Admitting: Physical Therapy

## 2014-05-19 ENCOUNTER — Ambulatory Visit: Payer: Medicare Other | Admitting: Physical Therapy

## 2014-05-19 DIAGNOSIS — Z5189 Encounter for other specified aftercare: Secondary | ICD-10-CM | POA: Diagnosis not present

## 2014-05-19 MED ORDER — PHENYLEPHRINE HCL 2.5 % OP SOLN
OPHTHALMIC | Status: AC
  Filled 2014-05-19: qty 15

## 2014-05-19 MED ORDER — CYCLOPENTOLATE-PHENYLEPHRINE OP SOLN OPTIME - NO CHARGE
OPHTHALMIC | Status: AC
  Filled 2014-05-19: qty 2

## 2014-05-19 MED ORDER — KETOROLAC TROMETHAMINE 0.5 % OP SOLN
OPHTHALMIC | Status: AC
  Filled 2014-05-19: qty 5

## 2014-05-19 MED ORDER — TETRACAINE HCL 0.5 % OP SOLN
OPHTHALMIC | Status: AC
  Filled 2014-05-19: qty 2

## 2014-05-20 ENCOUNTER — Encounter (HOSPITAL_COMMUNITY): Payer: Self-pay | Admitting: *Deleted

## 2014-05-20 ENCOUNTER — Ambulatory Visit: Payer: Medicare Other | Admitting: Dietician

## 2014-05-20 ENCOUNTER — Encounter (HOSPITAL_COMMUNITY)
Admission: RE | Disposition: A | Discharge status: Home / Self Care | Payer: Self-pay | Source: Ambulatory Visit | Attending: Ophthalmology

## 2014-05-20 ENCOUNTER — Encounter (HOSPITAL_COMMUNITY): Payer: Medicare Other | Admitting: Anesthesiology

## 2014-05-20 ENCOUNTER — Ambulatory Visit (HOSPITAL_COMMUNITY): Payer: Medicare Other | Admitting: Anesthesiology

## 2014-05-20 ENCOUNTER — Ambulatory Visit (HOSPITAL_COMMUNITY)
Admission: RE | Admit: 2014-05-20 | Discharge: 2014-05-20 | Disposition: A | Discharge status: Home / Self Care | Payer: Medicare Other | Source: Ambulatory Visit | Attending: Ophthalmology | Admitting: Ophthalmology

## 2014-05-20 DIAGNOSIS — Z79899 Other long term (current) drug therapy: Secondary | ICD-10-CM | POA: Diagnosis not present

## 2014-05-20 DIAGNOSIS — E119 Type 2 diabetes mellitus without complications: Secondary | ICD-10-CM | POA: Insufficient documentation

## 2014-05-20 DIAGNOSIS — D649 Anemia, unspecified: Secondary | ICD-10-CM | POA: Diagnosis not present

## 2014-05-20 DIAGNOSIS — E039 Hypothyroidism, unspecified: Secondary | ICD-10-CM | POA: Diagnosis not present

## 2014-05-20 DIAGNOSIS — M1990 Unspecified osteoarthritis, unspecified site: Secondary | ICD-10-CM | POA: Insufficient documentation

## 2014-05-20 DIAGNOSIS — K219 Gastro-esophageal reflux disease without esophagitis: Secondary | ICD-10-CM | POA: Diagnosis not present

## 2014-05-20 DIAGNOSIS — G4730 Sleep apnea, unspecified: Secondary | ICD-10-CM | POA: Insufficient documentation

## 2014-05-20 DIAGNOSIS — I2510 Atherosclerotic heart disease of native coronary artery without angina pectoris: Secondary | ICD-10-CM | POA: Diagnosis not present

## 2014-05-20 DIAGNOSIS — I10 Essential (primary) hypertension: Secondary | ICD-10-CM | POA: Insufficient documentation

## 2014-05-20 DIAGNOSIS — Z87891 Personal history of nicotine dependence: Secondary | ICD-10-CM | POA: Insufficient documentation

## 2014-05-20 DIAGNOSIS — H2512 Age-related nuclear cataract, left eye: Secondary | ICD-10-CM | POA: Diagnosis not present

## 2014-05-20 HISTORY — PX: CATARACT EXTRACTION W/PHACO: SHX586

## 2014-05-20 LAB — GLUCOSE, CAPILLARY: GLUCOSE-CAPILLARY: 111 mg/dL — AB (ref 70–99)

## 2014-05-20 SURGERY — PHACOEMULSIFICATION, CATARACT, WITH IOL INSERTION
Anesthesia: Monitor Anesthesia Care | Laterality: Left

## 2014-05-20 MED ORDER — EPINEPHRINE HCL 1 MG/ML IJ SOLN
INTRAOCULAR | Status: DC | PRN
  Administered 2014-05-20: 09:00:00

## 2014-05-20 MED ORDER — BSS IO SOLN
INTRAOCULAR | Status: DC | PRN
  Administered 2014-05-20: 15 mL via INTRAOCULAR

## 2014-05-20 MED ORDER — EPINEPHRINE HCL 1 MG/ML IJ SOLN
INTRAMUSCULAR | Status: AC
  Filled 2014-05-20: qty 1

## 2014-05-20 MED ORDER — LACTATED RINGERS IV SOLN
INTRAVENOUS | Status: DC
  Administered 2014-05-20: 08:00:00 via INTRAVENOUS

## 2014-05-20 MED ORDER — TETRACAINE 0.5 % OP SOLN OPTIME - NO CHARGE
OPHTHALMIC | Status: DC | PRN
  Administered 2014-05-20: 2 [drp] via OPHTHALMIC

## 2014-05-20 MED ORDER — CYCLOPENTOLATE-PHENYLEPHRINE 0.2-1 % OP SOLN
1.0000 [drp] | OPHTHALMIC | Status: AC
  Administered 2014-05-20 (×3): 1 [drp] via OPHTHALMIC

## 2014-05-20 MED ORDER — LIDOCAINE HCL 3.5 % OP GEL: 1.0000 "application " | Freq: Once | OPHTHALMIC | Status: DC

## 2014-05-20 MED ORDER — TETRACAINE HCL 0.5 % OP SOLN
1.0000 [drp] | OPHTHALMIC | Status: AC
  Administered 2014-05-20 (×3): 1 [drp] via OPHTHALMIC

## 2014-05-20 MED ORDER — KETOROLAC TROMETHAMINE 0.5 % OP SOLN
1.0000 [drp] | OPHTHALMIC | Status: AC
  Administered 2014-05-20 (×3): 1 [drp] via OPHTHALMIC

## 2014-05-20 MED ORDER — PROVISC 10 MG/ML IO SOLN
INTRAOCULAR | Status: DC | PRN
  Administered 2014-05-20: 0.85 mL via INTRAOCULAR

## 2014-05-20 MED ORDER — PHENYLEPHRINE HCL 2.5 % OP SOLN
1.0000 [drp] | OPHTHALMIC | Status: AC
  Administered 2014-05-20 (×3): 1 [drp] via OPHTHALMIC

## 2014-05-20 MED ORDER — FENTANYL CITRATE 0.05 MG/ML IJ SOLN
INTRAMUSCULAR | Status: AC
  Filled 2014-05-20: qty 2

## 2014-05-20 MED ORDER — MIDAZOLAM HCL 2 MG/2ML IJ SOLN
INTRAMUSCULAR | Status: AC
  Filled 2014-05-20: qty 2

## 2014-05-20 MED ORDER — LACTATED RINGERS IV SOLN
INTRAVENOUS | Status: DC | PRN
  Administered 2014-05-20: 08:00:00 via INTRAVENOUS

## 2014-05-20 MED ORDER — FENTANYL CITRATE 0.05 MG/ML IJ SOLN
25.0000 ug | INTRAMUSCULAR | Status: AC
  Administered 2014-05-20 (×2): 25 ug via INTRAVENOUS

## 2014-05-20 MED ORDER — MIDAZOLAM HCL 2 MG/2ML IJ SOLN
1.0000 mg | INTRAMUSCULAR | Status: DC | PRN
  Administered 2014-05-20: 2 mg via INTRAVENOUS

## 2014-05-20 SURGICAL SUPPLY — 24 items
CAPSULAR TENSION RING-AMO (OPHTHALMIC RELATED) IMPLANT
CLOTH BEACON ORANGE TIMEOUT ST (SAFETY) ×1 IMPLANT
EYE SHIELD UNIVERSAL CLEAR (GAUZE/BANDAGES/DRESSINGS) ×1 IMPLANT
GLOVE BIO SURGEON STRL SZ 6.5 (GLOVE) ×1 IMPLANT
GLOVE ECLIPSE 6.5 STRL STRAW (GLOVE) IMPLANT
GLOVE ECLIPSE 7.0 STRL STRAW (GLOVE) IMPLANT
GLOVE EXAM NITRILE LRG STRL (GLOVE) IMPLANT
GLOVE EXAM NITRILE MD LF STRL (GLOVE) ×1 IMPLANT
GLOVE SKINSENSE NS SZ6.5 (GLOVE)
GLOVE SKINSENSE STRL SZ6.5 (GLOVE) IMPLANT
HEALON 5 0.6 ML (INTRAOCULAR LENS) IMPLANT
KIT VITRECTOMY (OPHTHALMIC RELATED) IMPLANT
PAD ARMBOARD 7.5X6 YLW CONV (MISCELLANEOUS) ×1 IMPLANT
PROC W NO LENS (INTRAOCULAR LENS)
PROC W SPEC LENS (INTRAOCULAR LENS)
PROCESS W NO LENS (INTRAOCULAR LENS) IMPLANT
PROCESS W SPEC LENS (INTRAOCULAR LENS) IMPLANT
RETRACTOR IRIS SIGHTPATH (OPHTHALMIC RELATED) IMPLANT
RING MALYGIN (MISCELLANEOUS) IMPLANT
SIGHTPATH CAT PROC W REG LENS (Ophthalmic Related) ×2 IMPLANT
TAPE SURG TRANSPARENT 2IN (GAUZE/BANDAGES/DRESSINGS) IMPLANT
TAPE TRANSPARENT 2IN (GAUZE/BANDAGES/DRESSINGS) ×1
VISCOELASTIC ADDITIONAL (OPHTHALMIC RELATED) IMPLANT
WATER STERILE IRR 250ML POUR (IV SOLUTION) ×1 IMPLANT

## 2014-05-20 NOTE — Anesthesia Preprocedure Evaluation (Signed)
Anesthesia Evaluation  Patient identified by MRN, date of birth, ID band Patient awake    Reviewed: Allergy & Precautions, H&P , NPO status , Patient's Chart, lab work & pertinent test results  Airway Mallampati: III  TM Distance: >3 FB     Dental  (+) Poor Dentition   Pulmonary shortness of breath, sleep apnea , former smoker,  breath sounds clear to auscultation        Cardiovascular hypertension, Pt. on medications + CAD and + Cardiac Stents Rhythm:Regular Rate:Normal     Neuro/Psych PSYCHIATRIC DISORDERS Depression    GI/Hepatic hiatal hernia, GERD-  ,  Endo/Other  diabetes, Type 2, Oral Hypoglycemic AgentsHypothyroidism   Renal/GU      Musculoskeletal  (+) Arthritis -,   Abdominal   Peds  Hematology  (+) anemia ,   Anesthesia Other Findings   Reproductive/Obstetrics                             Anesthesia Physical Anesthesia Plan  ASA: III  Anesthesia Plan: MAC   Post-op Pain Management:    Induction: Intravenous  Airway Management Planned: Nasal Cannula  Additional Equipment:   Intra-op Plan:   Post-operative Plan:   Informed Consent: I have reviewed the patients History and Physical, chart, labs and discussed the procedure including the risks, benefits and alternatives for the proposed anesthesia with the patient or authorized representative who has indicated his/her understanding and acceptance.     Plan Discussed with:   Anesthesia Plan Comments:         Anesthesia Quick Evaluation

## 2014-05-20 NOTE — Op Note (Signed)
Patient brought to the operating room and prepped and draped in the usual manner.  Lid speculum inserted in left eye.  Stab incision made at the twelve o'clock position.  Provisc instilled in the anterior chamber.   A 2.4 mm. Stab incision was made temporally.  An anterior capsulotomy was done with a bent 25 gauge needle.  The nucleus was hydrodissected.  The Phaco tip was inserted in the anterior chamber and the nucleus was emulsified.  CDE was 4.42.  The cortical material was then removed with the I and A tip.  Posterior capsule was the polished.  The anterior chamber was deepened with Provisc.  A 24.5 Alcon [id number] IOL was then inserted in the capsular bag.  Provisc was then removed with the I and A tip.  The wound was then hydrated.  Patient sent to the Recovery Room in good condition with follow up in my office.  Preoperative Diagnosis:  Nuclear Cataract OS Postoperative Diagnosis:  Same Procedure name: Kelman Phacoemulsification OS with IOL

## 2014-05-20 NOTE — Transfer of Care (Signed)
Immediate Anesthesia Transfer of Care Note  Patient: Andrea Norton  Procedure(s) Performed: Procedure(s) with comments: CATARACT EXTRACTION PHACO AND INTRAOCULAR LENS PLACEMENT (IOC) (Left) - CDE:4.42  Patient Location: Short Stay  Anesthesia Type:MAC  Level of Consciousness: awake, alert , oriented and patient cooperative  Airway & Oxygen Therapy: Patient Spontanous Breathing  Post-op Assessment: Report given to PACU RN and Post -op Vital signs reviewed and stable  Post vital signs: Reviewed and stable  Complications: No apparent anesthesia complications

## 2014-05-20 NOTE — Anesthesia Procedure Notes (Signed)
Procedure Name: MAC Date/Time: 05/20/2014 8:25 AM Performed by: Pernell DupreADAMS, AMY A Pre-anesthesia Checklist: Patient identified, Timeout performed, Emergency Drugs available, Suction available and Patient being monitored Oxygen Delivery Method: Nasal cannula

## 2014-05-20 NOTE — H&P (Signed)
The patient was re examined and there is no change in the patients condition since the original H and P. 

## 2014-05-20 NOTE — Anesthesia Postprocedure Evaluation (Signed)
  Anesthesia Post-op Note  Patient: Andrea Norton  Procedure(s) Performed: Procedure(s) with comments: CATARACT EXTRACTION PHACO AND INTRAOCULAR LENS PLACEMENT (IOC) (Left) - CDE:4.42  Patient Location: Short Stay  Anesthesia Type:MAC  Level of Consciousness: awake, alert , oriented and patient cooperative  Airway and Oxygen Therapy: Patient Spontanous Breathing  Post-op Pain: none  Post-op Assessment: Post-op Vital signs reviewed, Patient's Cardiovascular Status Stable, Respiratory Function Stable, Patent Airway, No signs of Nausea or vomiting and Pain level controlled  Post-op Vital Signs: Reviewed and stable  Last Vitals:  Filed Vitals:   05/20/14 0820  BP: 117/67  Pulse:   Temp:   Resp: 25    Complications: No apparent anesthesia complications

## 2014-05-20 NOTE — Discharge Instructions (Signed)
Andrea Norton  05/20/2014           [facility] Instructions [address]- [place] [id number] [address]      1. Avoid closing eyes tightly. One often closes the eye tightly when laughing, talking, sneezing, coughing or if they feel irritated. At these times, you should be careful not to close your eyes tightly.  2. Instill eye drops as instructed. To instill drops in your eye, open it, look up and have someone gently pull the lower lid down and instill a couple of drops inside the lower lid.  3. Do not touch upper lid.  4. Take Advil or Tylenol for pain.  5. You may use either eye for near work, such as reading or sewing and you may watch television.  6. You may have your hair done at the beauty parlor at any time.  7. Wear dark glasses with or without your own glasses if you are in bright light.  8. Call our office at [phone] or [phone] if you have sharp pain in your eye or unusual symptoms.  9. Do not be concerned because vision in the operative eye is not good. It will not be good, no matter how successful the operation, until you get a special lens for it. Your old glasses will not be suited to the new eye that was operated on and you will not be ready for a new lens for about a month.  10. [facility].    I have received a copy of the above instructions and will follow them.    Follow up appt today with Dr.  Nile RiggsShapiro between 2-3 pm

## 2014-05-21 ENCOUNTER — Encounter: Payer: Medicare Other | Admitting: Physical Therapy

## 2014-05-21 ENCOUNTER — Encounter (HOSPITAL_COMMUNITY): Payer: Self-pay | Admitting: Ophthalmology

## 2014-05-21 ENCOUNTER — Encounter: Payer: Medicare Other | Attending: Surgery | Admitting: Dietician

## 2014-05-21 DIAGNOSIS — Z6841 Body Mass Index (BMI) 40.0 and over, adult: Secondary | ICD-10-CM | POA: Insufficient documentation

## 2014-05-21 DIAGNOSIS — Z713 Dietary counseling and surveillance: Secondary | ICD-10-CM | POA: Diagnosis not present

## 2014-05-21 NOTE — Progress Notes (Signed)
Medical Nutrition Therapy:  Appt start time: 215 end time:  230  SWL visit #5:  Primary concerns today:  Andrea Norton returns today for her 5th SWL weight loss visit in preparation for gastric sleeve having maintained her weight since last visit. She has been doing physical therapy 2 days a week. She states her schedule has been hectic so her eating routine has been off. Did not like the Premier protein shake. Another protein shakes handout was provided. Andrea Norton reports plans to try a shake using protein powder.   Wt Readings from Last 3 Encounters:  05/21/14 294 lb 1.6 oz (133.403 kg)  05/06/14 289 lb (131.09 kg)  05/06/14 289 lb (131.09 kg)   Ht Readings from Last 3 Encounters:  05/06/14 5' 4.5" (1.638 m)  05/06/14 5' 4.5" (1.638 m)  04/02/14 5\' 2"  (1.575 m)   There is no weight on file to calculate BMI. @BMIFA @ Normalized weight-for-age data available only for age 10 to 20 years. Normalized stature-for-age data available only for age 10 to 20 years.   MEDICATIONS: see list  Recent physical activity: not able to do any physical activity; walks in the pool some  Estimated energy needs: 1600 calories  Progress Towards Goal(s):  In progress.   Nutritional Diagnosis:  [place]-3.3 Overweight/obesity related to past poor dietary habits and physical inactivity as evidenced by patient w/ pending gastric sleeve surgery following dietary guidelines for continued weight loss.     Intervention:  Nutrition counseling provided.   Monitoring/Evaluation:  Dietary intake, exercise, and body weight in 4 week(s).

## 2014-05-22 ENCOUNTER — Ambulatory Visit: Payer: Medicare Other | Admitting: Physical Therapy

## 2014-05-22 DIAGNOSIS — Z5189 Encounter for other specified aftercare: Secondary | ICD-10-CM | POA: Diagnosis not present

## 2014-05-26 ENCOUNTER — Ambulatory Visit: Payer: Medicare Other | Attending: Orthopedic Surgery | Admitting: Physical Therapy

## 2014-05-26 DIAGNOSIS — Z5189 Encounter for other specified aftercare: Secondary | ICD-10-CM | POA: Insufficient documentation

## 2014-05-26 DIAGNOSIS — R269 Unspecified abnormalities of gait and mobility: Secondary | ICD-10-CM | POA: Insufficient documentation

## 2014-05-26 DIAGNOSIS — M25661 Stiffness of right knee, not elsewhere classified: Secondary | ICD-10-CM | POA: Insufficient documentation

## 2014-05-26 DIAGNOSIS — R5381 Other malaise: Secondary | ICD-10-CM | POA: Insufficient documentation

## 2014-05-26 DIAGNOSIS — M25561 Pain in right knee: Secondary | ICD-10-CM | POA: Insufficient documentation

## 2014-05-28 ENCOUNTER — Ambulatory Visit: Payer: Medicare Other | Admitting: Physical Therapy

## 2014-05-28 DIAGNOSIS — Z5189 Encounter for other specified aftercare: Secondary | ICD-10-CM | POA: Diagnosis not present

## 2014-06-16 ENCOUNTER — Ambulatory Visit: Payer: Medicare Other

## 2014-06-23 ENCOUNTER — Ambulatory Visit: Payer: Medicare Other | Admitting: Dietician

## 2014-06-26 ENCOUNTER — Ambulatory Visit (INDEPENDENT_AMBULATORY_CARE_PROVIDER_SITE_OTHER): Payer: Self-pay | Admitting: Surgery

## 2014-06-26 NOTE — H&P (Signed)
Chief Complaint: Morbid obesity, diabetes mellitus and large ventral hernias.  History of Present Illness: Andrea Norton is an 68 y.o. female referred by Dr. Lysbeth GalasNyland who follows her closely medically both for diabetes in for her weight and who referred her for consideration of repair of her ventral herniae. She underwent a 1 stage colectomy by Dr. Josephine CablesHenry Fleischman for diverticulitis several years ago and developed an epigastric and also a lower abdominal hernia. She's also had problems with knee issues and failed arthroplasties and this probably has to do with her weight as well. Her current BMI is 48.  I discussed sleeve gastrectomy with her and her husband. She is interested in following along and having such surgery. All were for her to the web and are. We'll give her some more information on this. I think to do a laparoscopic sleeve gastrectomy and possibly repair these hernias at the same time might be an option or delayed repair the hernias until she's lost weight. Certainly this would have an impact on her knees as well.  Past Medical History  Diagnosis Date  . Hyperlipidemia   . Status post primary angioplasty with coronary stent     DRUG-ELUTING STENT X1 TO PROXIMAL LAD  . Chronic back pain   . Chronic neck pain   . Arthritis     HANDS, KNEES  . DDD (degenerative disc disease), cervical   . DDD (degenerative disc disease), lumbar   . Radicular pain in left arm OCCASIONAL PAIN SHOOTING DOWN LEFT ARM SECAONDARY TO CERVICAL DEGENERATION  . Hemorrhoid   . Diverticulitis of colon   . B12 deficiency anemia   . Vitamin D deficiency   . Hypothyroidism   . Synovitis of knee HYPERTROPHIC RIGHT KNEE  . Abnormal stress test 01-22-2009    LOW RISK ADENOSINE NUCLEAR STUDY W/ PROBABLE MILD APICAL THINNING BUT NO ISCHEMIA  . Depression     states well controlled  . Sleep apnea     couldnt tolerate machine- states last  sleep study " many years ago"  . GERD (gastroesophageal reflux disease)   . Inguinal hernia, right     with ventral hernia  . Coronary artery disease 2010    STRESS TEST /, NOte Dr Kirtland BouchardHocherin EPIC/ no cardiologist now--STATES HEART STENT PLACED 2010  . Hypertension      PCP Dr Lysbeth GalasNyland   . Diabetes mellitus     ORAL MED - NO INSULIN  . Hemorrhoids     Past Surgical History  Procedure Laterality Date  . Knee closed reduction  05-07-2009 RIGHT KNEE    LEFT KNEE 10-01-2009  . Total knee arthroplasty  LEFT 2005    RIGHT 03-13-2009  . Total knee revision  RIGHT 09-30-2009    LEFT 11-25-2009  . Hysteroscopy w/d&c  2002  . Open patellofemoral right knee/ lateral release  05-04-2009  . Knee arthroscopy w/ debridement  01-22-2010    SEPTIC KNEE  . Right knee i & d polythylene revision  02-04-2010    KNEE INFECTED  . Left foot surg.  1990  . Appendectomy  1993  . Knee arthroscopy  LEFT 1999    RIGHT 2004  . Hemicolectomy for diverticulitis  2005  . Knee arthroscopy  08/03/2011    Procedure: ARTHROSCOPY KNEE; Surgeon: Loanne DrillingFrank V Aluisio; Location: [facility]; Service: Orthopedics; Laterality: Right;  . Synovectomy  08/03/2011    Procedure: SYNOVECTOMY; Surgeon: Loanne DrillingFrank V Aluisio; Location: [facility]; Service: Orthopedics; Laterality: Right;  . Joint replacement    .  Coronary angioplasty  2004    DRUG-ELUTING STENT X1 TO PROXIMAL LAD  . I&d knee with poly exchange  02/01/2012    Procedure: IRRIGATION AND DEBRIDEMENT KNEE WITH POLY EXCHANGE; Surgeon: Loanne Drilling, MD; Location: [facility]; Service: Orthopedics; Laterality: Right; Right Knee Polyethlene Revision   . Colon surgery    . Total knee revision Right 02/20/2013    Procedure: RIGHT TOTAL KNEE ARTHROPLASTY REVISION VERSUS RESECTION ARTHROPLASTY; Surgeon:  Loanne Drilling, MD; Location: [facility]; Service: Orthopedics; Laterality: Right;    Current Outpatient Prescriptions  Medication Sig Dispense Refill  . escitalopram (LEXAPRO) 10 MG tablet Take 10 mg by mouth every evening.     . furosemide (LASIX) 20 MG tablet Take 20 mg by mouth daily with breakfast.     . gabapentin (NEURONTIN) 100 MG capsule Take 200 mg by mouth 2 (two) times daily.    Marland Kitchen ketoconazole (NIZORAL) 2 % cream Apply 1 application topically daily.     Marland Kitchen levothyroxine (SYNTHROID, LEVOTHROID) 125 MCG tablet Take 112 mcg by mouth daily before breakfast.     . metFORMIN (GLUCOPHAGE) 500 MG tablet Take 500 mg by mouth daily with breakfast.    . methocarbamol (ROBAXIN) 500 MG tablet Take 1 tablet (500 mg total) by mouth every 6 (six) hours as needed. 80 tablet 0  . metoprolol succinate (TOPROL-XL) 25 MG 24 hr tablet Take 25 mg by mouth daily with breakfast.     . oxyCODONE (OXY IR/ROXICODONE) 5 MG immediate release tablet Take 1-4 tablets (5-20 mg total) by mouth every 3 (three) hours as needed. 90 tablet 0  . pantoprazole (PROTONIX) 40 MG tablet Take 40 mg by mouth at bedtime.     . potassium chloride SA (K-DUR,KLOR-CON) 20 MEQ tablet Take 20 mEq by mouth daily with breakfast.     . rivaroxaban (XARELTO) 10 MG TABS tablet Take 1 tablet (10 mg total) by mouth daily with breakfast. Take Xarelto for two and a half more weeks, then discontinue Xarelto. Once the patient has completed the Xarelto, they may resume the 81 mg Aspirin. 18 tablet 0  . simvastatin (ZOCOR) 40 MG tablet Take 40 mg by mouth every morning.     . traMADol (ULTRAM) 50 MG tablet Take 1-2 tablets (50-100 mg total) by mouth every 6 (six) hours as needed (mild pain). 80 tablet 0   No current facility-administered medications for this visit.   Morphine and related Family History  Problem Relation Age of Onset   . COPD Mother   . Cancer Father     lung   Social History:  reports that she quit smoking about 11 years ago. Her smoking use included Cigarettes. She smoked 0.00 packs per day for 39 years. She has never used smokeless tobacco. She reports that she does not drink alcohol or use illicit drugs.   REVIEW OF SYSTEMS - PERTINENT POSITIVES ONLY: See details above.  Physical Exam:  Blood pressure 132/86, pulse 80, temperature 98.2 F (36.8 C), temperature source Temporal, resp. rate 18, height 5' 4.5" (1.638 m), weight 289 lb 9.6 oz (131.362 kg). Body mass index is 48.96 kg/(m^2).  Gen: WDWN white female NAD  Neurological: Alert and oriented to person, place, and time. Motor and sensory function is grossly intact  Head: Normocephalic and atraumatic.  Eyes: Conjunctivae are normal. Pupils are equal, round, and reactive to light. No scleral icterus.  Neck: Normal range of motion. Neck supple. No tracheal deviation or thyromegaly present.  Cardiovascular: SR without murmurs or  gallops. No carotid bruits Respiratory: Effort normal. No respiratory distress. No chest wall tenderness. Breath sounds normal. No wheezes, rales or rhonchi.  Abdomen: Midline incisions with large upper ventral hernia that is tender at times and a lower ventral incisional hernia which she says does not bother her. GU: Musculoskeletal: Normal range of motion. Extremities are nontender. No cyanosis, edema or clubbing noted Lymphadenopathy: No cervical, preauricular, postauricular or axillary adenopathy is present Skin: Skin is warm and dry. No rash noted. No diaphoresis. No erythema. No pallor. Pscyh: Normal mood and affect. Behavior is normal. Judgment and thought content normal.   LABORATORY RESULTS:  Lab Results Last 48 Hours    No results found for this or any previous visit (from the past 48 hour(s)).    RADIOLOGY RESULTS:  Imaging Results (Last 48 hours)    No results found.    Problem  List: Patient Active Problem List   Diagnosis Date Noted  . Postoperative anemia due to acute blood loss 02/21/2013  . 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 DEGENERATIVE JOINT DISEASE 01/09/2009  . HYPOTHYROIDISM 01/06/2009  . DYSLIPIDEMIA 01/06/2009  . OBESITY, MORBID 01/06/2009  . HYPERTENSION 01/06/2009  . CAD 01/06/2009  . GASTROESOPHAGEAL REFLUX DISEASE 01/06/2009  . SLEEP APNEA 01/06/2009  . DYSPNEA 01/06/2009  . DIVERTICULITIS, HX OF 01/06/2009    Assessment & Plan: Morbid obesity, type 2 diabetes and ventral hernias. Small hiatal hernia with GER on UGI.  Will look to repair hiatus hernia at the time of the sleeve gastrectomy.  May also place sutures to repair ventral hernias but will not be able to place artificial mesh at that time.      Matt B. Daphine DeutscherMartin, MD, [facility]  [facility] [phone] beeper [phone]

## 2014-07-07 ENCOUNTER — Encounter: Payer: Medicare Other | Attending: Surgery

## 2014-07-07 DIAGNOSIS — Z6841 Body Mass Index (BMI) 40.0 and over, adult: Secondary | ICD-10-CM | POA: Diagnosis not present

## 2014-07-07 DIAGNOSIS — E119 Type 2 diabetes mellitus without complications: Secondary | ICD-10-CM | POA: Diagnosis not present

## 2014-07-07 DIAGNOSIS — Z713 Dietary counseling and surveillance: Secondary | ICD-10-CM | POA: Diagnosis not present

## 2014-07-08 NOTE — Progress Notes (Signed)
  Pre-Operative Nutrition Class:  Appt start time: 6815   End time:  1830.  Patient was seen on 07/07/14 for Pre-Operative Bariatric Surgery [facility]   Surgery date: 07/21/14 Surgery type: Gastric sleeve Start weight at [facility]: 288 lbs on 12/28/13 Weight today: 300 lbs  TANITA  BODY COMP RESULTS  07/07/14   BMI (kg/m^2) 51.5   Fat Mass (lbs) 156.5   Fat Free Mass (lbs) 143.5   Total Body Water (lbs) 105   Samples given per MNT protocol. Patient educated on appropriate usage: Premier protein shake (chocolate - qty 1) Lot #: [id number] Exp: 03/2015   The following the learning objectives were met by the patient during this course:  Identify Pre-Op Dietary Goals and will begin 2 weeks pre-operatively  Identify appropriate sources of fluids and proteins   State protein recommendations and appropriate sources pre and post-operatively  Identify Post-Operative Dietary Goals and will follow for 2 weeks post-operatively  Identify appropriate multivitamin and calcium sources  Describe the need for physical activity post-operatively and will follow MD recommendations  State when to call healthcare provider regarding medication questions or post-operative complications  Handouts given during class include:  Pre-Op Bariatric Surgery Diet Handout  Protein Shake Handout  Post-Op Bariatric Surgery Nutrition Handout  BELT Program Information Flyer  Support Group Information Flyer  [facility] Bariatric Supplements Price List  Follow-Up Plan: Patient will follow-up at [facility] 2 weeks post operatively for diet advancement per MD.

## 2014-07-09 NOTE — Patient Instructions (Signed)
Andrea Norton  07/09/2014   Your procedure is scheduled on: Monday 07/21/2014  [facility] and follow signs to               Short Stay Center at 0910 AM.  Call this number if you have problems the morning of surgery [phone]   Remember:  Do not eat food or drink liquids :After Midnight.     Take these medicines the morning of surgery with A SIP OF WATER: Gabapentin, Levothyroxine                               You may not have any metal on your body including hair pins and              piercings  Do not wear jewelry, make-up, lotions, powders or perfumes.             Do not wear nail polish.  Do not shave  48 hours prior to surgery.              Men may shave face and neck.   Do not bring valuables to the hospital. [facility] IS NOT             RESPONSIBLE   FOR VALUABLES.  Contacts, dentures or bridgework may not be worn into surgery.  Leave suitcase in the car. After surgery it may be brought to your room.     Patients discharged the day of surgery will not be allowed to drive home.  Name and phone number of your driver:  Special Instructions: N/A              Please read over the following fact sheets you were given: _____________________________________________________________________             [facility] - Preparing for [facility] Before surgery, you can play an important role.  Because skin is not sterile, your skin needs to be as free of germs as possible.  You can reduce the number of germs on your skin by washing with CHG (chlorahexidine gluconate) soap before surgery.  CHG is an antiseptic cleaner which kills germs and bonds with the skin to continue killing germs even after washing. Please DO NOT use if you have an allergy to CHG or antibacterial soaps.  If your skin becomes reddened/irritated stop using the CHG and inform your nurse when you arrive at Short Stay. Do not shave (including legs and underarms) for at  least 48 hours prior to the first CHG shower.  You may shave your face/neck. Please follow these instructions carefully:  1.  Shower with CHG Soap the night before surgery and the  morning of Surgery.  2.  If you choose to wash your hair, wash your hair first as usual with your  normal  shampoo.  3.  After you shampoo, rinse your hair and body thoroughly to remove the  shampoo.                           4.  Use CHG as you would any other liquid soap.  You can apply chg directly  to the skin and wash                       Gently  with a scrungie or clean washcloth.  5.  Apply the CHG Soap to your body ONLY FROM THE NECK DOWN.   Do not use on face/ open                           Wound or open sores. Avoid contact with eyes, ears mouth and genitals (private parts).                       Wash face,  Genitals (private parts) with your normal soap.             6.  Wash thoroughly, paying special attention to the area where your surgery  will be performed.  7.  Thoroughly rinse your body with warm water from the neck down.  8.  DO NOT shower/wash with your normal soap after using and rinsing off  the CHG Soap.                9.  Pat yourself dry with a clean towel.            10.  Wear clean pajamas.            11.  Place clean sheets on your bed the night of your first shower and do not  sleep with pets. Day of Surgery : Do not apply any lotions/deodorants the morning of surgery.  Please wear clean clothes to the hospital/surgery center.  FAILURE TO FOLLOW THESE INSTRUCTIONS MAY RESULT IN THE CANCELLATION OF YOUR SURGERY PATIENT SIGNATURE_________________________________  NURSE SIGNATURE__________________________________  ________________________________________________________________________   Rogelia MireIncentive Spirometer  An incentive spirometer is a tool that can help keep your lungs clear and active. This tool measures how well you are filling your lungs with each breath. Taking long deep breaths  may help reverse or decrease the chance of developing breathing (pulmonary) problems (especially infection) following:  A long period of time when you are unable to move or be active. BEFORE THE PROCEDURE   If the spirometer includes an indicator to show your best effort, your nurse or respiratory therapist will set it to a desired goal.  If possible, sit up straight or lean slightly forward. Try not to slouch.  Hold the incentive spirometer in an upright position. INSTRUCTIONS FOR USE  1. Sit on the edge of your bed if possible, or sit up as far as you can in bed or on a chair. 2. Hold the incentive spirometer in an upright position. 3. Breathe out normally. 4. Place the mouthpiece in your mouth and seal your lips tightly around it. 5. Breathe in slowly and as deeply as possible, raising the piston or the ball toward the top of the column. 6. Hold your breath for 3-5 seconds or for as long as possible. Allow the piston or ball to fall to the bottom of the column. 7. Remove the mouthpiece from your mouth and breathe out normally. 8. Rest for a few seconds and repeat Steps 1 through 7 at least 10 times every 1-2 hours when you are awake. Take your time and take a few normal breaths between deep breaths. 9. The spirometer may include an indicator to show your best effort. Use the indicator as a goal to work toward during each repetition. 10. After each set of 10 deep breaths, practice coughing to be sure your lungs are clear. If you have an incision (the cut made at the time of surgery), support  your incision when coughing by placing a pillow or rolled up towels firmly against it. Once you are able to get out of bed, walk around indoors and cough well. You may stop using the incentive spirometer when instructed by your caregiver.  RISKS AND COMPLICATIONS  Take your time so you do not get dizzy or light-headed.  If you are in pain, you may need to take or ask for pain medication before doing  incentive spirometry. It is harder to take a deep breath if you are having pain. AFTER USE  Rest and breathe slowly and easily.  It can be helpful to keep track of a log of your progress. Your caregiver can provide you with a simple table to help with this. If you are using the spirometer at home, follow these instructions: [place] IF:   You are having difficultly using the spirometer.  You have trouble using the spirometer as often as instructed.  Your pain medication is not giving enough relief while using the spirometer.  You develop fever of 100.5 F (38.1 C) or higher. SEEK IMMEDIATE MEDICAL CARE IF:   You cough up bloody sputum that had not been present before.  You develop fever of 102 F (38.9 C) or greater.  You develop worsening pain at or near the incision site. MAKE SURE YOU:   Understand these instructions.  Will watch your condition.  Will get help right away if you are not doing well or get worse. Document Released: 11/21/2006 Document Revised: 10/03/2011 Document Reviewed: 01/22/2007 [facility] Patient Information 2014 [place], [place].   ________________________________________________________________________

## 2014-07-10 ENCOUNTER — Encounter (HOSPITAL_COMMUNITY)
Admission: RE | Admit: 2014-07-10 | Discharge: 2014-07-10 | Disposition: A | Discharge status: Home / Self Care | Payer: Medicare Other | Source: Ambulatory Visit | Attending: Surgery | Admitting: Surgery

## 2014-07-10 ENCOUNTER — Encounter (HOSPITAL_COMMUNITY): Payer: Self-pay

## 2014-07-10 DIAGNOSIS — E119 Type 2 diabetes mellitus without complications: Secondary | ICD-10-CM | POA: Diagnosis not present

## 2014-07-10 DIAGNOSIS — K439 Ventral hernia without obstruction or gangrene: Secondary | ICD-10-CM | POA: Insufficient documentation

## 2014-07-10 DIAGNOSIS — Z01812 Encounter for preprocedural laboratory examination: Secondary | ICD-10-CM | POA: Diagnosis not present

## 2014-07-10 LAB — CBC WITH DIFFERENTIAL/PLATELET
Basophils Absolute: 0.1 10*3/uL (ref 0.0–0.1)
Basophils Relative: 1 % (ref 0–1)
Eosinophils Absolute: 0.4 10*3/uL (ref 0.0–0.7)
Eosinophils Relative: 5 % (ref 0–5)
HCT: 39 % (ref 36.0–46.0)
HEMOGLOBIN: 11.6 g/dL — AB (ref 12.0–15.0)
LYMPHS ABS: 1.9 10*3/uL (ref 0.7–4.0)
Lymphocytes Relative: 25 % (ref 12–46)
MCH: 24 pg — ABNORMAL LOW (ref 26.0–34.0)
MCHC: 29.7 g/dL — ABNORMAL LOW (ref 30.0–36.0)
MCV: 80.6 fL (ref 78.0–100.0)
MONOS PCT: 11 % (ref 3–12)
Monocytes Absolute: 0.8 10*3/uL (ref 0.1–1.0)
NEUTROS ABS: 4.6 10*3/uL (ref 1.7–7.7)
Neutrophils Relative %: 58 % (ref 43–77)
PLATELETS: 311 10*3/uL (ref 150–400)
RBC: 4.84 MIL/uL (ref 3.87–5.11)
RDW: 15.5 % (ref 11.5–15.5)
WBC: 7.7 10*3/uL (ref 4.0–10.5)

## 2014-07-10 LAB — COMPREHENSIVE METABOLIC PANEL
ALBUMIN: 3.5 g/dL (ref 3.5–5.2)
ALK PHOS: 93 U/L (ref 39–117)
ALT: 18 U/L (ref 0–35)
ANION GAP: 12 (ref 5–15)
AST: 21 U/L (ref 0–37)
BILIRUBIN TOTAL: 0.9 mg/dL (ref 0.3–1.2)
BUN: 22 mg/dL (ref 6–23)
CHLORIDE: 98 meq/L (ref 96–112)
CO2: 27 mEq/L (ref 19–32)
Calcium: 9.6 mg/dL (ref 8.4–10.5)
Creatinine, Ser: 0.96 mg/dL (ref 0.50–1.10)
GFR calc Af Amer: 70 mL/min — ABNORMAL LOW (ref >90)
GFR calc non Af Amer: 60 mL/min — ABNORMAL LOW (ref >90)
Glucose, Bld: 110 mg/dL — ABNORMAL HIGH (ref 70–99)
Potassium: 4.4 mEq/L (ref 3.7–5.3)
SODIUM: 137 meq/L (ref 137–147)
Total Protein: 7.8 g/dL (ref 6.0–8.3)

## 2014-07-21 ENCOUNTER — Inpatient Hospital Stay (HOSPITAL_COMMUNITY)
Admission: RE | Admit: 2014-07-21 | Discharge: 2014-07-23 | DRG: 620 | Disposition: A | Discharge status: Home / Self Care | Payer: Medicare Other | Source: Ambulatory Visit | Attending: Surgery | Admitting: Surgery

## 2014-07-21 ENCOUNTER — Encounter (HOSPITAL_COMMUNITY)
Admission: RE | Disposition: A | Discharge status: Home / Self Care | Payer: Self-pay | Source: Ambulatory Visit | Attending: Surgery

## 2014-07-21 ENCOUNTER — Inpatient Hospital Stay (HOSPITAL_COMMUNITY): Payer: Medicare Other | Admitting: Anesthesiology

## 2014-07-21 ENCOUNTER — Encounter (HOSPITAL_COMMUNITY): Payer: Self-pay | Admitting: *Deleted

## 2014-07-21 ENCOUNTER — Other Ambulatory Visit (INDEPENDENT_AMBULATORY_CARE_PROVIDER_SITE_OTHER): Payer: Self-pay | Admitting: Surgery

## 2014-07-21 DIAGNOSIS — E785 Hyperlipidemia, unspecified: Secondary | ICD-10-CM | POA: Diagnosis present

## 2014-07-21 DIAGNOSIS — E119 Type 2 diabetes mellitus without complications: Secondary | ICD-10-CM | POA: Diagnosis present

## 2014-07-21 DIAGNOSIS — I10 Essential (primary) hypertension: Secondary | ICD-10-CM | POA: Diagnosis present

## 2014-07-21 DIAGNOSIS — K436 Other and unspecified ventral hernia with obstruction, without gangrene: Secondary | ICD-10-CM | POA: Diagnosis present

## 2014-07-21 DIAGNOSIS — K219 Gastro-esophageal reflux disease without esophagitis: Secondary | ICD-10-CM | POA: Diagnosis present

## 2014-07-21 DIAGNOSIS — Z6841 Body Mass Index (BMI) 40.0 and over, adult: Secondary | ICD-10-CM

## 2014-07-21 DIAGNOSIS — Z01812 Encounter for preprocedural laboratory examination: Secondary | ICD-10-CM

## 2014-07-21 DIAGNOSIS — G4730 Sleep apnea, unspecified: Secondary | ICD-10-CM | POA: Diagnosis present

## 2014-07-21 DIAGNOSIS — M170 Bilateral primary osteoarthritis of knee: Secondary | ICD-10-CM | POA: Diagnosis present

## 2014-07-21 DIAGNOSIS — Z903 Acquired absence of stomach [part of]: Secondary | ICD-10-CM

## 2014-07-21 DIAGNOSIS — Z955 Presence of coronary angioplasty implant and graft: Secondary | ICD-10-CM

## 2014-07-21 DIAGNOSIS — Z87891 Personal history of nicotine dependence: Secondary | ICD-10-CM

## 2014-07-21 DIAGNOSIS — E039 Hypothyroidism, unspecified: Secondary | ICD-10-CM | POA: Diagnosis present

## 2014-07-21 DIAGNOSIS — K449 Diaphragmatic hernia without obstruction or gangrene: Secondary | ICD-10-CM | POA: Diagnosis present

## 2014-07-21 DIAGNOSIS — Z9884 Bariatric surgery status: Secondary | ICD-10-CM

## 2014-07-21 HISTORY — PX: LAPAROSCOPIC GASTRIC SLEEVE RESECTION: SHX5895

## 2014-07-21 LAB — CBC
HEMATOCRIT: 36.6 % (ref 36.0–46.0)
HEMOGLOBIN: 11.2 g/dL — AB (ref 12.0–15.0)
MCH: 24.5 pg — AB (ref 26.0–34.0)
MCHC: 30.6 g/dL (ref 30.0–36.0)
MCV: 80.1 fL (ref 78.0–100.0)
Platelets: 260 10*3/uL (ref 150–400)
RBC: 4.57 MIL/uL (ref 3.87–5.11)
RDW: 16.7 % — ABNORMAL HIGH (ref 11.5–15.5)
WBC: 13.1 10*3/uL — ABNORMAL HIGH (ref 4.0–10.5)

## 2014-07-21 LAB — GLUCOSE, CAPILLARY
GLUCOSE-CAPILLARY: 109 mg/dL — AB (ref 70–99)
Glucose-Capillary: 213 mg/dL — ABNORMAL HIGH (ref 70–99)
Glucose-Capillary: 149 mg/dL — ABNORMAL HIGH (ref 70–99)
Glucose-Capillary: 158 mg/dL — ABNORMAL HIGH (ref 70–99)

## 2014-07-21 LAB — CREATININE, SERUM
CREATININE: 0.93 mg/dL (ref 0.50–1.10)
GFR calc Af Amer: 73 mL/min — ABNORMAL LOW (ref >90)
GFR calc non Af Amer: 63 mL/min — ABNORMAL LOW (ref >90)

## 2014-07-21 LAB — PREGNANCY, URINE: Preg Test, Ur: NEGATIVE

## 2014-07-21 SURGERY — GASTRECTOMY, SLEEVE, LAPAROSCOPIC
Anesthesia: General | Site: Abdomen

## 2014-07-21 MED ORDER — DEXAMETHASONE SODIUM PHOSPHATE 10 MG/ML IJ SOLN
INTRAMUSCULAR | Status: DC | PRN
  Administered 2014-07-21: 10 mg via INTRAVENOUS

## 2014-07-21 MED ORDER — ONDANSETRON HCL 4 MG/2ML IJ SOLN
INTRAMUSCULAR | Status: DC | PRN
Start: 2014-07-21 — End: 2014-07-21
  Administered 2014-07-21: 4 mg via INTRAVENOUS

## 2014-07-21 MED ORDER — CEFOXITIN SODIUM 2 G IV SOLR
2.0000 g | INTRAVENOUS | Status: AC
  Administered 2014-07-21 (×2): 2 g via INTRAVENOUS

## 2014-07-21 MED ORDER — LACTATED RINGERS IR SOLN
Status: DC | PRN
  Administered 2014-07-21: 1000 mL

## 2014-07-21 MED ORDER — BUPIVACAINE LIPOSOME 1.3 % IJ SUSP
20.0000 mL | Freq: Once | INTRAMUSCULAR | Status: DC
  Filled 2014-07-21: qty 20

## 2014-07-21 MED ORDER — HEPARIN SODIUM (PORCINE) 5000 UNIT/ML IJ SOLN
5000.0000 [IU] | INTRAMUSCULAR | Status: AC
  Administered 2014-07-21: 5000 [IU] via SUBCUTANEOUS
  Filled 2014-07-21: qty 1

## 2014-07-21 MED ORDER — ROCURONIUM BROMIDE 100 MG/10ML IV SOLN
INTRAVENOUS | Status: AC
  Filled 2014-07-21: qty 1

## 2014-07-21 MED ORDER — KETOROLAC TROMETHAMINE 30 MG/ML IJ SOLN: 30.0000 mg | Freq: Once | INTRAMUSCULAR | Status: DC | PRN

## 2014-07-21 MED ORDER — DEXTROSE 5 % IV SOLN
INTRAVENOUS | Status: AC
  Filled 2014-07-21: qty 2

## 2014-07-21 MED ORDER — LIDOCAINE HCL (CARDIAC) 20 MG/ML IV SOLN
INTRAVENOUS | Status: AC
  Filled 2014-07-21: qty 5

## 2014-07-21 MED ORDER — UNJURY VANILLA POWDER: 2.0000 [oz_av] | Freq: Four times a day (QID) | ORAL | Status: DC

## 2014-07-21 MED ORDER — CHLORHEXIDINE GLUCONATE CLOTH 2 % EX PADS: 6.0000 | MEDICATED_PAD | Freq: Once | CUTANEOUS | Status: DC

## 2014-07-21 MED ORDER — MORPHINE SULFATE 2 MG/ML IJ SOLN
2.0000 mg | INTRAMUSCULAR | Status: DC | PRN
  Administered 2014-07-21: 2 mg via INTRAVENOUS
  Filled 2014-07-21: qty 1

## 2014-07-21 MED ORDER — HYDROMORPHONE HCL 1 MG/ML IJ SOLN
INTRAMUSCULAR | Status: DC | PRN
  Administered 2014-07-21 (×2): 0.5 mg via INTRAVENOUS

## 2014-07-21 MED ORDER — MIDAZOLAM HCL 5 MG/5ML IJ SOLN
INTRAMUSCULAR | Status: DC | PRN
  Administered 2014-07-21 (×2): 1 mg via INTRAVENOUS

## 2014-07-21 MED ORDER — 0.9 % SODIUM CHLORIDE (POUR BTL) OPTIME
TOPICAL | Status: DC | PRN
  Administered 2014-07-21: 1000 mL

## 2014-07-21 MED ORDER — LIDOCAINE HCL (CARDIAC) 20 MG/ML IV SOLN
INTRAVENOUS | Status: DC | PRN
  Administered 2014-07-21: 50 mg via INTRAVENOUS

## 2014-07-21 MED ORDER — ACETAMINOPHEN 160 MG/5ML PO SOLN: 650.0000 mg | ORAL | Status: DC | PRN

## 2014-07-21 MED ORDER — FENTANYL CITRATE 0.05 MG/ML IJ SOLN
INTRAMUSCULAR | Status: AC
  Filled 2014-07-21: qty 5

## 2014-07-21 MED ORDER — GLYCOPYRROLATE 0.2 MG/ML IJ SOLN
INTRAMUSCULAR | Status: DC | PRN
  Administered 2014-07-21: 0.6 mg via INTRAVENOUS

## 2014-07-21 MED ORDER — ONDANSETRON HCL 4 MG/2ML IJ SOLN
4.0000 mg | INTRAMUSCULAR | Status: DC | PRN
Start: 2014-07-21 — End: 2014-07-23
  Administered 2014-07-21 – 2014-07-22 (×3): 4 mg via INTRAVENOUS
  Filled 2014-07-21 (×3): qty 2

## 2014-07-21 MED ORDER — ROCURONIUM BROMIDE 100 MG/10ML IV SOLN
INTRAVENOUS | Status: DC | PRN
  Administered 2014-07-21: 50 mg via INTRAVENOUS
  Administered 2014-07-21 (×2): 10 mg via INTRAVENOUS

## 2014-07-21 MED ORDER — CEFOXITIN SODIUM 2 G IV SOLR
INTRAVENOUS | Status: AC
  Filled 2014-07-21: qty 2

## 2014-07-21 MED ORDER — DEXAMETHASONE SODIUM PHOSPHATE 10 MG/ML IJ SOLN
INTRAMUSCULAR | Status: AC
  Filled 2014-07-21: qty 1

## 2014-07-21 MED ORDER — EPHEDRINE SULFATE 50 MG/ML IJ SOLN
INTRAMUSCULAR | Status: AC
  Filled 2014-07-21: qty 1

## 2014-07-21 MED ORDER — PROMETHAZINE HCL 25 MG/ML IJ SOLN: 6.2500 mg | INTRAMUSCULAR | Status: DC | PRN

## 2014-07-21 MED ORDER — SODIUM CHLORIDE 0.9 % IJ SOLN
INTRAMUSCULAR | Status: AC
  Filled 2014-07-21: qty 10

## 2014-07-21 MED ORDER — CHLORHEXIDINE GLUCONATE 0.12 % MT SOLN
15.0000 mL | Freq: Two times a day (BID) | OROMUCOSAL | Status: DC
  Administered 2014-07-21 – 2014-07-22 (×3): 15 mL via OROMUCOSAL
  Filled 2014-07-21 (×6): qty 15

## 2014-07-21 MED ORDER — EPHEDRINE SULFATE 50 MG/ML IJ SOLN
INTRAMUSCULAR | Status: DC | PRN
  Administered 2014-07-21 (×3): 10 mg via INTRAVENOUS

## 2014-07-21 MED ORDER — METOCLOPRAMIDE HCL 5 MG/ML IJ SOLN
INTRAMUSCULAR | Status: AC
  Administered 2014-07-21: 10 mg
  Filled 2014-07-21: qty 2

## 2014-07-21 MED ORDER — METOCLOPRAMIDE HCL 5 MG/ML IJ SOLN: 10.0000 mg | Freq: Once | INTRAMUSCULAR | Status: DC

## 2014-07-21 MED ORDER — HYDROMORPHONE HCL 1 MG/ML IJ SOLN
INTRAMUSCULAR | Status: AC
  Filled 2014-07-21: qty 1

## 2014-07-21 MED ORDER — HYDROMORPHONE HCL 1 MG/ML IJ SOLN
0.5000 mg | INTRAMUSCULAR | Status: DC | PRN
  Administered 2014-07-21: 0.5 mg via INTRAVENOUS
  Administered 2014-07-22 (×3): 1 mg via INTRAVENOUS
  Administered 2014-07-22: 0.5 mg via INTRAVENOUS
  Administered 2014-07-23 (×2): 1 mg via INTRAVENOUS
  Filled 2014-07-21 (×7): qty 1

## 2014-07-21 MED ORDER — UNJURY CHICKEN SOUP POWDER
2.0000 [oz_av] | Freq: Four times a day (QID) | ORAL | Status: DC
  Administered 2014-07-23: 2 [oz_av] via ORAL

## 2014-07-21 MED ORDER — INSULIN ASPART 100 UNIT/ML ~~LOC~~ SOLN
SUBCUTANEOUS | Status: AC
  Filled 2014-07-21: qty 1

## 2014-07-21 MED ORDER — LACTATED RINGERS IV SOLN
INTRAVENOUS | Status: DC
  Administered 2014-07-21: 1000 mL via INTRAVENOUS
  Administered 2014-07-21: 12:00:00 via INTRAVENOUS

## 2014-07-21 MED ORDER — FENTANYL CITRATE 0.05 MG/ML IJ SOLN
INTRAMUSCULAR | Status: DC | PRN
  Administered 2014-07-21: 50 ug via INTRAVENOUS
  Administered 2014-07-21: 100 ug via INTRAVENOUS
  Administered 2014-07-21 (×2): 50 ug via INTRAVENOUS

## 2014-07-21 MED ORDER — HYDROMORPHONE HCL 2 MG/ML IJ SOLN
INTRAMUSCULAR | Status: AC
  Filled 2014-07-21: qty 1

## 2014-07-21 MED ORDER — UNJURY CHOCOLATE CLASSIC POWDER: 2.0000 [oz_av] | Freq: Four times a day (QID) | ORAL | Status: DC

## 2014-07-21 MED ORDER — ONDANSETRON HCL 4 MG/2ML IJ SOLN
INTRAMUSCULAR | Status: AC
  Filled 2014-07-21: qty 2

## 2014-07-21 MED ORDER — NEOSTIGMINE METHYLSULFATE 10 MG/10ML IV SOLN
INTRAVENOUS | Status: DC | PRN
Start: 2014-07-21 — End: 2014-07-21
  Administered 2014-07-21: 4 mg via INTRAVENOUS

## 2014-07-21 MED ORDER — HEPARIN SODIUM (PORCINE) 5000 UNIT/ML IJ SOLN
5000.0000 [IU] | Freq: Three times a day (TID) | INTRAMUSCULAR | Status: DC
  Administered 2014-07-22 – 2014-07-23 (×4): 5000 [IU] via SUBCUTANEOUS
  Filled 2014-07-21 (×7): qty 1

## 2014-07-21 MED ORDER — HYDROMORPHONE HCL 1 MG/ML IJ SOLN
0.2500 mg | INTRAMUSCULAR | Status: DC | PRN
Start: 2014-07-21 — End: 2014-07-21
  Administered 2014-07-21 (×2): 0.5 mg via INTRAVENOUS

## 2014-07-21 MED ORDER — INSULIN ASPART 100 UNIT/ML ~~LOC~~ SOLN
0.0000 [IU] | SUBCUTANEOUS | Status: DC
  Administered 2014-07-21: 7 [IU] via SUBCUTANEOUS
  Administered 2014-07-21: 4 [IU] via SUBCUTANEOUS
  Administered 2014-07-21 – 2014-07-22 (×2): 3 [IU] via SUBCUTANEOUS
  Administered 2014-07-22: 4 [IU] via SUBCUTANEOUS
  Administered 2014-07-22 (×2): 3 [IU] via SUBCUTANEOUS
  Administered 2014-07-22: 4 [IU] via SUBCUTANEOUS
  Administered 2014-07-22 – 2014-07-23 (×3): 3 [IU] via SUBCUTANEOUS

## 2014-07-21 MED ORDER — TISSEEL VH 10 ML EX KIT
PACK | CUTANEOUS | Status: AC
  Filled 2014-07-21: qty 1

## 2014-07-21 MED ORDER — OXYCODONE HCL 5 MG/5ML PO SOLN
5.0000 mg | ORAL | Status: DC | PRN
  Administered 2014-07-22: 5 mg via ORAL
  Filled 2014-07-21: qty 5

## 2014-07-21 MED ORDER — KCL IN DEXTROSE-NACL 20-5-0.45 MEQ/L-%-% IV SOLN
INTRAVENOUS | Status: DC
  Administered 2014-07-21 – 2014-07-22 (×4): via INTRAVENOUS
  Filled 2014-07-21 (×6): qty 1000

## 2014-07-21 MED ORDER — BUPIVACAINE LIPOSOME 1.3 % IJ SUSP
INTRAMUSCULAR | Status: DC | PRN
  Administered 2014-07-21: 20 mL

## 2014-07-21 MED ORDER — CETYLPYRIDINIUM CHLORIDE 0.05 % MT LIQD
7.0000 mL | Freq: Two times a day (BID) | OROMUCOSAL | Status: DC
  Administered 2014-07-21: 7 mL via OROMUCOSAL

## 2014-07-21 MED ORDER — GLYCOPYRROLATE 0.2 MG/ML IJ SOLN
INTRAMUSCULAR | Status: AC
  Filled 2014-07-21: qty 3

## 2014-07-21 MED ORDER — PROPOFOL 10 MG/ML IV BOLUS
INTRAVENOUS | Status: AC
  Filled 2014-07-21: qty 20

## 2014-07-21 MED ORDER — NEOSTIGMINE METHYLSULFATE 10 MG/10ML IV SOLN
INTRAVENOUS | Status: AC
  Filled 2014-07-21: qty 1

## 2014-07-21 MED ORDER — PROPOFOL 10 MG/ML IV BOLUS
INTRAVENOUS | Status: DC | PRN
  Administered 2014-07-21: 200 mg via INTRAVENOUS

## 2014-07-21 MED ORDER — MIDAZOLAM HCL 2 MG/2ML IJ SOLN
INTRAMUSCULAR | Status: AC
  Filled 2014-07-21: qty 2

## 2014-07-21 MED ORDER — ACETAMINOPHEN 160 MG/5ML PO SOLN: 325.0000 mg | ORAL | Status: DC | PRN

## 2014-07-21 SURGICAL SUPPLY — 68 items
APL SRG 32X5 SNPLK LF DISP (MISCELLANEOUS)
APPLICATOR COTTON TIP 6IN STRL (MISCELLANEOUS) ×1 IMPLANT
APPLIER CLIP 5 13 M/L LIGAMAX5 (MISCELLANEOUS)
APPLIER CLIP ROT 10 11.4 M/L (STAPLE)
APPLIER CLIP ROT 13.4 12 LRG (CLIP)
APR CLP LRG 13.4X12 ROT 20 MLT (CLIP)
APR CLP MED LRG 11.4X10 (STAPLE)
APR CLP MED LRG 5 ANG JAW (MISCELLANEOUS)
BLADE HEX COATED 2.75 (ELECTRODE) ×1 IMPLANT
BLADE SURG 15 STRL LF DISP TIS (BLADE) ×1 IMPLANT
BLADE SURG 15 STRL SS (BLADE) ×2
CABLE HIGH FREQUENCY MONO STRZ (ELECTRODE) ×1 IMPLANT
CLIP APPLIE 5 13 M/L LIGAMAX5 (MISCELLANEOUS) ×1 IMPLANT
CLIP APPLIE ROT 10 11.4 M/L (STAPLE) IMPLANT
CLIP APPLIE ROT 13.4 12 LRG (CLIP) IMPLANT
DEVICE SUT QUICK LOAD TK 5 (STAPLE) ×1 IMPLANT
DEVICE SUT TI-KNOT TK 5X26 (MISCELLANEOUS) ×1 IMPLANT
DEVICE SUTURE ENDOST 10MM (ENDOMECHANICALS) ×1 IMPLANT
DEVICE TROCAR PUNCTURE CLOSURE (ENDOMECHANICALS) ×2 IMPLANT
DISSECTOR BLUNT TIP ENDO 5MM (MISCELLANEOUS) ×2 IMPLANT
DRAPE CAMERA CLOSED 9X96 (DRAPES) ×2 IMPLANT
ELECT REM PT RETURN 9FT ADLT (ELECTROSURGICAL) ×2
ELECTRODE REM PT RTRN 9FT ADLT (ELECTROSURGICAL) ×1 IMPLANT
GAUZE SPONGE 4X4 12PLY STRL (GAUZE/BANDAGES/DRESSINGS) IMPLANT
GLOVE BIOGEL M 8.0 STRL (GLOVE) ×2 IMPLANT
GOWN STRL REUS W/TWL XL LVL3 (GOWN DISPOSABLE) ×8 IMPLANT
HANDLE STAPLE EGIA 4 XL (STAPLE) ×2 IMPLANT
HOVERMATT SINGLE USE (MISCELLANEOUS) ×2 IMPLANT
KIT BASIN OR (CUSTOM PROCEDURE TRAY) ×2 IMPLANT
LIQUID BAND (GAUZE/BANDAGES/DRESSINGS) ×1 IMPLANT
NDL SPNL 22GX3.5 QUINCKE BK (NEEDLE) ×1 IMPLANT
NEEDLE SPNL 22GX3.5 QUINCKE BK (NEEDLE) ×2 IMPLANT
PACK UNIVERSAL I (CUSTOM PROCEDURE TRAY) ×2 IMPLANT
PEN SKIN MARKING BROAD (MISCELLANEOUS) ×2 IMPLANT
PENCIL BUTTON HOLSTER BLD 10FT (ELECTRODE) ×1 IMPLANT
RELOAD ENDO STITCH (ENDOMECHANICALS) IMPLANT
RELOAD STAPLE 45 PURP MED/THCK (STAPLE) IMPLANT
RELOAD SUT TRIPLE-STITCH 2-0 (ENDOMECHANICALS) IMPLANT
RELOAD TRI 45 ART MED THCK BLK (STAPLE) ×2 IMPLANT
RELOAD TRI 45 ART MED THCK PUR (STAPLE) ×2 IMPLANT
RELOAD TRI 60 ART MED THCK BLK (STAPLE) ×2 IMPLANT
RELOAD TRI 60 ART MED THCK PUR (STAPLE) ×4 IMPLANT
SCISSORS LAP 5X45 EPIX DISP (ENDOMECHANICALS) ×1 IMPLANT
SCRUB PCMX 4 OZ (MISCELLANEOUS) ×3 IMPLANT
SEALANT SURGICAL APPL DUAL CAN (MISCELLANEOUS) IMPLANT
SET IRRIG TUBING LAPAROSCOPIC (IRRIGATION / IRRIGATOR) ×2 IMPLANT
SHEARS CURVED HARMONIC AC 45CM (MISCELLANEOUS) ×2 IMPLANT
SLEEVE ADV FIXATION 5X100MM (TROCAR) ×4 IMPLANT
SLEEVE GASTRECTOMY 36FR VISIGI (MISCELLANEOUS) ×2 IMPLANT
SOLUTION ANTI FOG 6CC (MISCELLANEOUS) ×2 IMPLANT
SPONGE LAP 18X18 X RAY DECT (DISPOSABLE) ×2 IMPLANT
STAPLER VISISTAT 35W (STAPLE) ×2 IMPLANT
SUT SURGIDAC NAB ES-9 0 48 120 (SUTURE) ×1 IMPLANT
SUT VIC AB 4-0 SH 18 (SUTURE) ×2 IMPLANT
SUT VICRYL 0 TIES 12 18 (SUTURE) ×2 IMPLANT
SYR 20CC LL (SYRINGE) ×2 IMPLANT
SYR 50ML LL SCALE MARK (SYRINGE) ×2 IMPLANT
TOWEL OR 17X26 10 PK STRL BLUE (TOWEL DISPOSABLE) ×4 IMPLANT
TOWEL OR NON WOVEN STRL DISP B (DISPOSABLE) ×2 IMPLANT
TRAY FOLEY CATH 14FRSI W/METER (CATHETERS) ×2 IMPLANT
TROCAR ADV FIXATION 12X100MM (TROCAR) ×2 IMPLANT
TROCAR ADV FIXATION 5X100MM (TROCAR) ×2 IMPLANT
TROCAR BLADELESS 15MM (ENDOMECHANICALS) ×2 IMPLANT
TROCAR BLADELESS OPT 5 100 (ENDOMECHANICALS) ×2 IMPLANT
TUBE CALIBRATION LAPBAND (TUBING) ×1 IMPLANT
TUBING CONNECTING 10 (TUBING) ×2 IMPLANT
TUBING ENDO SMARTCAP (MISCELLANEOUS) ×2 IMPLANT
TUBING FILTER THERMOFLATOR (ELECTROSURGICAL) ×2 IMPLANT

## 2014-07-21 NOTE — Op Note (Signed)
Name:  Andrea Norton MRN: [id number] Date of Surgery: 07/21/2014  Preop Diagnosis:  Morbid Obesity  Postop Diagnosis:  Morbid Obesity (weight - 289, BMI - 48.96), S/P Gastric Sleeve  Procedure:  Upper endoscopy  (Intraoperative)  Surgeon:  Ovidio Kinavid Aryon Nham, M.D.  Anesthesia:  GET  Indications for procedure: Andrea NgGloria O Hinote is a 68 y.o. female whose primary care physician is Josue HectorNYLAND,LEONARD ROBERT, MD and has completed a Gastric Sleeve today by Dr. Daphine DeutscherMartin.  I am doing an intraoperative upper endoscopy to evaluate the gastric pouch.  Operative Note: The patient is under general anesthesia.  [facility]  Dr. Daphine DeutscherMartin is laparoscoping the patient while I do an upper endoscopy to evaluate the stomach pouch.  With the patient intubated, I passed the Pentax upper endoscope without difficulty down the esophagus.  The esophago-gastric junction was at 41 cm.    The mucosa of the stomach looked viable and the staple line was intact without bleeding.  I advanced to the pylorus, but did not go through it.  While I insufflated the stomach pouch with air, Dr. Daphine DeutscherMartin  flooded the upper abdomen with saline to put the gastric pouch under saline.  There was no bubbling or evidence of a leak.  Photos were taken of the gastric pouch.  There was no evidence of narrowing of the pouch and the gastric sleeve looked tubular.  The scope was then withdrawn.  The esophagus was unremarkable and the patient tolerated the endoscopy without difficulty.  Ovidio Kinavid Christinia Lambeth, MD, [facility] Pager: [phone] Office phone:  [phone]

## 2014-07-21 NOTE — Anesthesia Postprocedure Evaluation (Signed)
  Anesthesia Post-op Note  Patient: Andrea Norton  Procedure(s) Performed: Procedure(s) (LRB): LAPAROSCOPIC GASTRIC SLEEVE RESECTION, TAKEDOWN OF INCARCERATED VENTRAL HERNIA, ENTERAL LYSIS, UPPER ENDOSCOPY (N/A)  Patient Location: PACU  Anesthesia Type: General  Level of Consciousness: awake and alert   Airway and Oxygen Therapy: Patient Spontanous Breathing  Post-op Pain: mild  Post-op Assessment: Post-op Vital signs reviewed, Patient's Cardiovascular Status Stable, Respiratory Function Stable, Patent Airway and No signs of Nausea or vomiting  Last Vitals:  Filed Vitals:   07/21/14 1845  BP: 108/68  Pulse: 83  Temp: 36.4 C  Resp: 16    Post-op Vital Signs: stable   Complications: No apparent anesthesia complications

## 2014-07-21 NOTE — H&P (View-Only) (Signed)
Chief Complaint: Morbid obesity, diabetes mellitus and large ventral hernias.  History of Present Illness: Andrea Norton is an 68 y.o. female referred by Dr. Lysbeth GalasNyland who follows her closely medically both for diabetes in for her weight and who referred her for consideration of repair of her ventral herniae. She underwent a 1 stage colectomy by Dr. Josephine CablesHenry Fleischman for diverticulitis several years ago and developed an epigastric and also a lower abdominal hernia. She's also had problems with knee issues and failed arthroplasties and this probably has to do with her weight as well. Her current BMI is 48.  I discussed sleeve gastrectomy with her and her husband. She is interested in following along and having such surgery. All were for her to the web and are. We'll give her some more information on this. I think to do a laparoscopic sleeve gastrectomy and possibly repair these hernias at the same time might be an option or delayed repair the hernias until she's lost weight. Certainly this would have an impact on her knees as well.  Past Medical History  Diagnosis Date  . Hyperlipidemia   . Status post primary angioplasty with coronary stent     DRUG-ELUTING STENT X1 TO PROXIMAL LAD  . Chronic back pain   . Chronic neck pain   . Arthritis     HANDS, KNEES  . DDD (degenerative disc disease), cervical   . DDD (degenerative disc disease), lumbar   . Radicular pain in left arm OCCASIONAL PAIN SHOOTING DOWN LEFT ARM SECAONDARY TO CERVICAL DEGENERATION  . Hemorrhoid   . Diverticulitis of colon   . B12 deficiency anemia   . Vitamin D deficiency   . Hypothyroidism   . Synovitis of knee HYPERTROPHIC RIGHT KNEE  . Abnormal stress test 01-22-2009    LOW RISK ADENOSINE NUCLEAR STUDY W/ PROBABLE MILD APICAL THINNING BUT NO ISCHEMIA  . Depression     states well controlled  . Sleep apnea     couldnt tolerate machine- states last  sleep study " many years ago"  . GERD (gastroesophageal reflux disease)   . Inguinal hernia, right     with ventral hernia  . Coronary artery disease 2010    STRESS TEST /, NOte Dr Kirtland BouchardHocherin EPIC/ no cardiologist now--STATES HEART STENT PLACED 2010  . Hypertension      PCP Dr Lysbeth GalasNyland   . Diabetes mellitus     ORAL MED - NO INSULIN  . Hemorrhoids     Past Surgical History  Procedure Laterality Date  . Knee closed reduction  05-07-2009 RIGHT KNEE    LEFT KNEE 10-01-2009  . Total knee arthroplasty  LEFT 2005    RIGHT 03-13-2009  . Total knee revision  RIGHT 09-30-2009    LEFT 11-25-2009  . Hysteroscopy w/d&c  2002  . Open patellofemoral right knee/ lateral release  05-04-2009  . Knee arthroscopy w/ debridement  01-22-2010    SEPTIC KNEE  . Right knee i & d polythylene revision  02-04-2010    KNEE INFECTED  . Left foot surg.  1990  . Appendectomy  1993  . Knee arthroscopy  LEFT 1999    RIGHT 2004  . Hemicolectomy for diverticulitis  2005  . Knee arthroscopy  08/03/2011    Procedure: ARTHROSCOPY KNEE; Surgeon: Loanne DrillingFrank V Aluisio; Location: [facility]; Service: Orthopedics; Laterality: Right;  . Synovectomy  08/03/2011    Procedure: SYNOVECTOMY; Surgeon: Loanne DrillingFrank V Aluisio; Location: [facility]; Service: Orthopedics; Laterality: Right;  . Joint replacement    .  Coronary angioplasty  2004    DRUG-ELUTING STENT X1 TO PROXIMAL LAD  . I&d knee with poly exchange  02/01/2012    Procedure: IRRIGATION AND DEBRIDEMENT KNEE WITH POLY EXCHANGE; Surgeon: Loanne Drilling, MD; Location: [facility]; Service: Orthopedics; Laterality: Right; Right Knee Polyethlene Revision   . Colon surgery    . Total knee revision Right 02/20/2013    Procedure: RIGHT TOTAL KNEE ARTHROPLASTY REVISION VERSUS RESECTION ARTHROPLASTY; Surgeon:  Loanne Drilling, MD; Location: [facility]; Service: Orthopedics; Laterality: Right;    Current Outpatient Prescriptions  Medication Sig Dispense Refill  . escitalopram (LEXAPRO) 10 MG tablet Take 10 mg by mouth every evening.     . furosemide (LASIX) 20 MG tablet Take 20 mg by mouth daily with breakfast.     . gabapentin (NEURONTIN) 100 MG capsule Take 200 mg by mouth 2 (two) times daily.    Marland Kitchen ketoconazole (NIZORAL) 2 % cream Apply 1 application topically daily.     Marland Kitchen levothyroxine (SYNTHROID, LEVOTHROID) 125 MCG tablet Take 112 mcg by mouth daily before breakfast.     . metFORMIN (GLUCOPHAGE) 500 MG tablet Take 500 mg by mouth daily with breakfast.    . methocarbamol (ROBAXIN) 500 MG tablet Take 1 tablet (500 mg total) by mouth every 6 (six) hours as needed. 80 tablet 0  . metoprolol succinate (TOPROL-XL) 25 MG 24 hr tablet Take 25 mg by mouth daily with breakfast.     . oxyCODONE (OXY IR/ROXICODONE) 5 MG immediate release tablet Take 1-4 tablets (5-20 mg total) by mouth every 3 (three) hours as needed. 90 tablet 0  . pantoprazole (PROTONIX) 40 MG tablet Take 40 mg by mouth at bedtime.     . potassium chloride SA (K-DUR,KLOR-CON) 20 MEQ tablet Take 20 mEq by mouth daily with breakfast.     . rivaroxaban (XARELTO) 10 MG TABS tablet Take 1 tablet (10 mg total) by mouth daily with breakfast. Take Xarelto for two and a half more weeks, then discontinue Xarelto. Once the patient has completed the Xarelto, they may resume the 81 mg Aspirin. 18 tablet 0  . simvastatin (ZOCOR) 40 MG tablet Take 40 mg by mouth every morning.     . traMADol (ULTRAM) 50 MG tablet Take 1-2 tablets (50-100 mg total) by mouth every 6 (six) hours as needed (mild pain). 80 tablet 0   No current facility-administered medications for this visit.   Morphine and related Family History  Problem Relation Age of Onset   . COPD Mother   . Cancer Father     lung   Social History:  reports that she quit smoking about 11 years ago. Her smoking use included Cigarettes. She smoked 0.00 packs per day for 39 years. She has never used smokeless tobacco. She reports that she does not drink alcohol or use illicit drugs.   REVIEW OF SYSTEMS - PERTINENT POSITIVES ONLY: See details above.  Physical Exam:  Blood pressure 132/86, pulse 80, temperature 98.2 F (36.8 C), temperature source Temporal, resp. rate 18, height 5' 4.5" (1.638 m), weight 289 lb 9.6 oz (131.362 kg). Body mass index is 48.96 kg/(m^2).  Gen: WDWN white female NAD  Neurological: Alert and oriented to person, place, and time. Motor and sensory function is grossly intact  Head: Normocephalic and atraumatic.  Eyes: Conjunctivae are normal. Pupils are equal, round, and reactive to light. No scleral icterus.  Neck: Normal range of motion. Neck supple. No tracheal deviation or thyromegaly present.  Cardiovascular: SR without murmurs or  gallops. No carotid bruits Respiratory: Effort normal. No respiratory distress. No chest wall tenderness. Breath sounds normal. No wheezes, rales or rhonchi.  Abdomen: Midline incisions with large upper ventral hernia that is tender at times and a lower ventral incisional hernia which she says does not bother her. GU: Musculoskeletal: Normal range of motion. Extremities are nontender. No cyanosis, edema or clubbing noted Lymphadenopathy: No cervical, preauricular, postauricular or axillary adenopathy is present Skin: Skin is warm and dry. No rash noted. No diaphoresis. No erythema. No pallor. Pscyh: Normal mood and affect. Behavior is normal. Judgment and thought content normal.   LABORATORY RESULTS:  Lab Results Last 48 Hours    No results found for this or any previous visit (from the past 48 hour(s)).    RADIOLOGY RESULTS:  Imaging Results (Last 48 hours)    No results found.    Problem  List: Patient Active Problem List   Diagnosis Date Noted  . Postoperative anemia due to acute blood loss 02/21/2013  . 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 DEGENERATIVE JOINT DISEASE 01/09/2009  . HYPOTHYROIDISM 01/06/2009  . DYSLIPIDEMIA 01/06/2009  . OBESITY, MORBID 01/06/2009  . HYPERTENSION 01/06/2009  . CAD 01/06/2009  . GASTROESOPHAGEAL REFLUX DISEASE 01/06/2009  . SLEEP APNEA 01/06/2009  . DYSPNEA 01/06/2009  . DIVERTICULITIS, HX OF 01/06/2009    Assessment & Plan: Morbid obesity, type 2 diabetes and ventral hernias. Small hiatal hernia with GER on UGI.  Will look to repair hiatus hernia at the time of the sleeve gastrectomy.  May also place sutures to repair ventral hernias but will not be able to place artificial mesh at that time.      Matt B. Daphine DeutscherMartin, MD, [facility] Center[facility]  [facility] [phone] beeper [phone]

## 2014-07-21 NOTE — Progress Notes (Signed)
Utilization review completed.  

## 2014-07-21 NOTE — Op Note (Signed)
Surgeon: Wenda LowMatt Trellis Vanoverbeke, MD, FACS  Asst:  Ovidio Kinavid Newman M.D. FACS  Anes:  General endotracheal  Procedure: Laparoscopy and takedown of incarcerated ventral hernia containing transverse colon, enteral lysis, Laparoscopic sleeve gastrectomy and upper endoscopy  Diagnosis: Morbid obesity with multiple ventral hernias from prior laparotomy for colectomy.  Complications: None  EBL:   40 cc  Description of Procedure:  The patient was take to OR 1 and given general anesthesia.  The abdomen was prepped with PCMX and draped sterilely.  A timeout was performed.  Access to the abdomen was achieved with a 5 mm Optiview through the left upper quadrant. This arrived in fat and I was able to in created insufflation. I then was able to discern many adhesions to her midline incision. With Dr. Allene PyoNewman's Asst. supple multiple 5 mm ports well around this midline began about a one-hour intra-lysis. The transverse colon was embedded up in this upper midportion of the incision. These hernias were large and were not Be amenable to repair at this time. There was also very large hernia in the lower midline in the suprapubic region it contained a lot of small bowel he can go easily in and out of this area.  I used a harmonic scalpel some to take down adhesions mainly used scissors. I then created a space for which I could operate. We took down some adhesions between the left lateral segment and the stomach and then placed the upper hand retractor. To the right of the midline come into a complex ventral hernia placed the 15 port very obliquely. I passed the calibration tubing into the stomach and with 10 mL of air and easily pulled back into the esophagus. We then did a retro-esophageal posterior dissection identifying both right and left crus and approximated this with a 0 Surgidek single suture.  The balloon test was repeated with 10 mL of air and the tube and balloon stayed in the abdomen indicating that the hernia was repaired.    The ViSiGi 36Fr tube was inserted to deflate the stomach and was pulled back into the esophagus.    The pylorus was identified and we measured 5 cm  back and marked the antrum.  At that point we began dissection to take down the greater curvature of the stomach using the Harmonic scalpel.  This dissection was taken all the way up to the left crus.  Posterior attachments of the stomach were also taken down.    The ViSiGi tube was then passed into the antrum and suction applied so that it was snug along the lessor curvature.  The "crow's foot" or incisura was identified.  The sleeve gastrectomy was begun using the [employer] InternationalCovidien platform stapler beginning with a 4. 5 black with TRS. This was followed by a 6 cm Black with TRS. This was then followed with multiple applications of a purple TRS all the way up to the proximal stomach. A nice uniform tube appeared to be the result..  When the sleeve was complete the tube was taken off suction and insufflated briefly.  The tube was withdrawn.  Upper endoscopy was then performed by Dr. Ezzard StandingNewman and this showed no bleeding and no bubbles and a new uniform tube was present..     The specimen was extracted through the 15 trocar site.  Wounds were infiltrated with Exparel and closed with 4-0 vicryl and Liquiban.    Matt B. Daphine DeutscherMartin, MD, [facility]ACS [facility] [place] [phone]

## 2014-07-21 NOTE — Transfer of Care (Signed)
Immediate Anesthesia Transfer of Care Note  Patient: Andrea NgGloria O Norton  Procedure(s) Performed: Procedure(s): LAPAROSCOPIC GASTRIC SLEEVE RESECTION with upper GI/possible hiatal hernia (N/A)  Patient Location: PACU  Anesthesia Type:General  Level of Consciousness: awake, alert  and oriented  Airway & Oxygen Therapy: Patient Spontanous Breathing and Patient connected to face mask oxygen  Post-op Assessment: Report given to PACU RN and Post -op Vital signs reviewed and stable  Post vital signs: Reviewed and stable  Complications: No apparent anesthesia complications

## 2014-07-21 NOTE — Interval H&P Note (Signed)
History and Physical Interval Note:  07/21/2014 10:40 AM  Andrea Norton  has presented today for surgery, with the diagnosis of Morbid Obesity  The various methods of treatment have been discussed with the patient and family. After consideration of risks, benefits and other options for treatment, the patient has consented to  Procedure(s): LAPAROSCOPIC GASTRIC SLEEVE RESECTION with upper GI/possible hiatal hernia (N/A) as a surgical intervention .  The patient's history has been reviewed, patient examined, no change in status, stable for surgery.  I have reviewed the patient's chart and labs.  Questions were answered to the patient's satisfaction.     [name] B

## 2014-07-21 NOTE — Anesthesia Preprocedure Evaluation (Addendum)
Anesthesia Evaluation  Patient identified by MRN, date of birth, ID band Patient awake    Reviewed: Allergy & Precautions, H&P , NPO status , Patient's Chart, lab work & pertinent test results  Airway Mallampati: III  TM Distance: <3 FB Neck ROM: Full    Dental  (+)    Pulmonary sleep apnea , former smoker,  breath sounds clear to auscultation  Pulmonary exam normal       Cardiovascular hypertension, Pt. on medications + CAD and + Cardiac Stents Rhythm:Regular Rate:Normal     Neuro/Psych negative neurological ROS  negative psych ROS   GI/Hepatic negative GI ROS, Neg liver ROS,   Endo/Other  diabetesHypothyroidism Morbid obesity  Renal/GU negative Renal ROS  negative genitourinary   Musculoskeletal negative musculoskeletal ROS (+)   Abdominal   Peds negative pediatric ROS (+)  Hematology negative hematology ROS (+)   Anesthesia Other Findings   Reproductive/Obstetrics negative OB ROS                           Anesthesia Physical Anesthesia Plan  ASA: III  Anesthesia Plan: General   Post-op Pain Management:    Induction: Intravenous  Airway Management Planned: Oral ETT  Additional Equipment:   Intra-op Plan:   Post-operative Plan: Extubation in OR  Informed Consent: I have reviewed the patients History and Physical, chart, labs and discussed the procedure including the risks, benefits and alternatives for the proposed anesthesia with the patient or authorized representative who has indicated his/her understanding and acceptance.   Dental advisory given  Plan Discussed with: CRNA and Surgeon  Anesthesia Plan Comments:         Anesthesia Quick Evaluation

## 2014-07-21 NOTE — Progress Notes (Signed)
Pt c/o upper chest area discomfort; sinus rhythm, rate 70's, BP 117/57; Dr. Okey Dupreose made aware, pain meds given, reglan IV given

## 2014-07-21 NOTE — Progress Notes (Signed)
Patient alert and oriented,op day.  Provided support and encouragement.  Encouraged pulmonary toilet and ambulation.  Discussed plan of care and expected time line.  All questions answered.  Will continue to monitor. 

## 2014-07-22 ENCOUNTER — Encounter (HOSPITAL_COMMUNITY): Payer: Self-pay | Admitting: Surgery

## 2014-07-22 ENCOUNTER — Inpatient Hospital Stay (HOSPITAL_COMMUNITY): Payer: Medicare Other

## 2014-07-22 LAB — CBC WITH DIFFERENTIAL/PLATELET
BASOS ABS: 0 10*3/uL (ref 0.0–0.1)
BASOS PCT: 0 % (ref 0–1)
Eosinophils Absolute: 0 10*3/uL (ref 0.0–0.7)
Eosinophils Relative: 0 % (ref 0–5)
HEMATOCRIT: 35.5 % — AB (ref 36.0–46.0)
Hemoglobin: 10.8 g/dL — ABNORMAL LOW (ref 12.0–15.0)
Lymphocytes Relative: 9 % — ABNORMAL LOW (ref 12–46)
Lymphs Abs: 0.8 10*3/uL (ref 0.7–4.0)
MCH: 24.8 pg — ABNORMAL LOW (ref 26.0–34.0)
MCHC: 30.4 g/dL (ref 30.0–36.0)
MCV: 81.4 fL (ref 78.0–100.0)
MONO ABS: 0.8 10*3/uL (ref 0.1–1.0)
Monocytes Relative: 8 % (ref 3–12)
Neutro Abs: 8 10*3/uL — ABNORMAL HIGH (ref 1.7–7.7)
Neutrophils Relative %: 83 % — ABNORMAL HIGH (ref 43–77)
Platelets: 312 10*3/uL (ref 150–400)
RBC: 4.36 MIL/uL (ref 3.87–5.11)
RDW: 16.2 % — AB (ref 11.5–15.5)
WBC: 9.7 10*3/uL (ref 4.0–10.5)

## 2014-07-22 LAB — GLUCOSE, CAPILLARY
GLUCOSE-CAPILLARY: 147 mg/dL — AB (ref 70–99)
GLUCOSE-CAPILLARY: 151 mg/dL — AB (ref 70–99)
Glucose-Capillary: 121 mg/dL — ABNORMAL HIGH (ref 70–99)
Glucose-Capillary: 125 mg/dL — ABNORMAL HIGH (ref 70–99)
Glucose-Capillary: 132 mg/dL — ABNORMAL HIGH (ref 70–99)
Glucose-Capillary: 136 mg/dL — ABNORMAL HIGH (ref 70–99)
Glucose-Capillary: 154 mg/dL — ABNORMAL HIGH (ref 70–99)

## 2014-07-22 LAB — HEMOGLOBIN AND HEMATOCRIT, BLOOD
HCT: 36.4 % (ref 36.0–46.0)
Hemoglobin: 11.1 g/dL — ABNORMAL LOW (ref 12.0–15.0)

## 2014-07-22 MED ORDER — IOHEXOL 300 MG/ML  SOLN
50.0000 mL | Freq: Once | INTRAMUSCULAR | Status: AC | PRN
  Administered 2014-07-22: 50 mL via ORAL

## 2014-07-22 NOTE — Plan of Care (Signed)
Problem: Food- and Nutrition-Related Knowledge Deficit (NB-1.1) Goal: Nutrition education Formal process to instruct or train a patient/client in a skill or to impart knowledge to help patients/clients voluntarily manage or modify food choices and eating behavior to maintain or improve health. Outcome: Completed/Met Date Met:  07/22/14 Nutrition Education Note  Received consult for diet education per DROP protocol.   S/p 12/28 laparoscopic sleeve gastrectomy   Discussed 2 week post op diet with pt. Emphasized that liquids must be non carbonated, non caffeinated, and sugar free. Fluid goals discussed. Pt to follow up with outpatient bariatric RD for further diet progression after 2 weeks. Multivitamins and minerals also reviewed. Teach back method used, pt expressed understanding, expect good compliance.   Diet: First 2 Weeks  You will see the nutritionist about two (2) weeks after your surgery. The nutritionist will increase the types of foods you can eat if you are handling liquids well:  If you have severe vomiting or nausea and cannot handle clear liquids lasting longer than 1 day, call your surgeon  Protein Shake  Drink at least 2 ounces of shake 5-6 times per day  Each serving of protein shakes (usually 8 - 12 ounces) should have a minimum of:  15 grams of protein  And no more than 5 grams of carbohydrate  Goal for protein each day:  Men = 80 grams per day  Women = 60 grams per day  Protein powder may be added to fluids such as non-fat milk or Lactaid milk or Soy milk (limit to 35 grams added protein powder per serving)   Hydration  Slowly increase the amount of water and other clear liquids as tolerated (See Acceptable Fluids)  Slowly increase the amount of protein shake as tolerated  Sip fluids slowly and throughout the day  May use sugar substitutes in small amounts (no more than 6 - 8 packets per day; i.e. Splenda)   Fluid Goal  The first goal is to drink at least 8 ounces  of protein shake/drink per day (or as directed by the nutritionist); some examples of protein shakes are [employer], [employer], EAS Edge HP, and Unjury. See handout from pre-op Bariatric Education Class:  Slowly increase the amount of protein shake you drink as tolerated  You may find it easier to slowly sip shakes throughout the day  It is important to get your proteins in first  Your fluid goal is to drink 64 - 100 ounces of fluid daily  It may take a few weeks to build up to this  32 oz (or more) should be clear liquids  And  32 oz (or more) should be full liquids (see below for examples)  Liquids should not contain sugar, caffeine, or carbonation   Clear Liquids:  Water or Sugar-free flavored water (i.e. Fruit H2O, Propel)  Decaffeinated coffee or tea (sugar-free)  Crystal Lite, Wyler's Lite, Minute Maid Lite  Sugar-free Jell-O  Bouillon or broth  Sugar-free Popsicle: *Less than 20 calories each; Limit 1 per day   Full Liquids:  Protein Shakes/Drinks + 2 choices per day of other full liquids  Full liquids must be:  No More Than 12 grams of Carbs per serving  No More Than 3 grams of Fat per serving  Strained low-fat cream soup  Non-Fat milk  Fat-free Lactaid Milk  Sugar-free yogurt (Dannon Lite & Fit, Greek yogurt)    Clayton Bibles, MS, RD, LDN Pager: [phone] After Hours Pager: [phone]

## 2014-07-22 NOTE — Care Management Note (Signed)
    Page 1 of 1   07/22/2014     10:52:06 AM CARE MANAGEMENT NOTE 07/22/2014  Patient:  Andrea Norton,Andrea Norton   Account Number:  [id number]  Date Initiated:  07/22/2014  Documentation initiated by:  Lorenda IshiharaPEELE,Myldred Raju  Subjective/Objective Assessment:   68 yo female admitted s/p sleeve and HH repair. PTA lived at home with spouse.     Action/Plan:   Home when stable   Anticipated DC Date:  07/24/2014   Anticipated DC Plan:  HOME/SELF CARE      DC Planning Services  CM consult      Choice offered to / List presented to:             Status of service:  Completed, signed off Medicare Important Message given?   (If response is "NO", the following Medicare IM given date fields will be blank) Date Medicare IM given:   Medicare IM given by:   Date Additional Medicare IM given:   Additional Medicare IM given by:    Discharge Disposition:  HOME/SELF CARE  Per UR Regulation:  Reviewed for med. necessity/level of care/duration of stay  If discussed at Long Length of Stay Meetings, dates discussed:    Comments:

## 2014-07-22 NOTE — Progress Notes (Signed)
Patient ID: Andrea Norton, female   DOB: [birth year], 68 y.o.   MRN: [id number] [facility] Surgery Progress Note:   1 Day Post-Op  Subjective: Mental status is clear.  No complaints Objective: Vital signs in last 24 hours: Temp:  [97.4 F (36.3 C)-99.1 F (37.3 C)] 98.1 F (36.7 C) (12/29 0547) Pulse Rate:  [72-91] 91 (12/29 0547) Resp:  [11-19] 18 (12/29 0547) BP: (97-134)/(52-89) 106/53 mmHg (12/29 0547) SpO2:  [93 %-100 %] 95 % (12/29 0547) Weight:  [290 lb 8 oz (131.77 kg)] 290 lb 8 oz (131.77 kg) (12/28 0857)  Intake/Output from previous day: 12/28 0701 - 12/29 0700 In: 3270 [I.V.:3270] Out: 2020 [Urine:2000; Blood:20] Intake/Output this shift:    Physical Exam: Work of breathing is not labored.  Minimal pain  Lab Results:  Results for orders placed or performed during the hospital encounter of 07/21/14 (from the past 48 hour(s))  Pregnancy, urine STAT morning of surgery     Status: None   Collection Time: 07/21/14  8:52 AM  Result Value Ref Range   Preg Test, Ur NEGATIVE NEGATIVE    Comment:        THE SENSITIVITY OF THIS METHODOLOGY IS >20 mIU/mL.   Glucose, capillary     Status: Abnormal   Collection Time: 07/21/14  9:05 AM  Result Value Ref Range   Glucose-Capillary 109 (H) 70 - 99 mg/dL   Comment 1 Notify RN   Glucose, capillary     Status: Abnormal   Collection Time: 07/21/14  2:09 PM  Result Value Ref Range   Glucose-Capillary 213 (H) 70 - 99 mg/dL   Comment 1 Documented in Chart    Comment 2 Notify RN   CBC     Status: Abnormal   Collection Time: 07/21/14  3:00 PM  Result Value Ref Range   WBC 13.1 (H) 4.0 - 10.5 K/uL   RBC 4.57 3.87 - 5.11 MIL/uL   Hemoglobin 11.2 (L) 12.0 - 15.0 g/dL   HCT 36.6 36.0 - 46.0 %   MCV 80.1 78.0 - 100.0 fL   MCH 24.5 (L) 26.0 - 34.0 pg   MCHC 30.6 30.0 - 36.0 g/dL   RDW 16.7 (H) 11.5 - 15.5 %   Platelets 260 150 - 400 K/uL  Creatinine, serum     Status: Abnormal   Collection Time: 07/21/14  3:00 PM  Result  Value Ref Range   Creatinine, Ser 0.93 0.50 - 1.10 mg/dL   GFR calc non Af Amer 63 (L) >90 mL/min   GFR calc Af Amer 73 (L) >90 mL/min    Comment: (NOTE) The eGFR has been calculated using the CKD EPI equation. This calculation has not been validated in all clinical situations. eGFR's persistently <90 mL/min signify possible Chronic Kidney Disease.   Glucose, capillary     Status: Abnormal   Collection Time: 07/21/14  4:04 PM  Result Value Ref Range   Glucose-Capillary 149 (H) 70 - 99 mg/dL  Glucose, capillary     Status: Abnormal   Collection Time: 07/21/14  7:36 PM  Result Value Ref Range   Glucose-Capillary 158 (H) 70 - 99 mg/dL  Glucose, capillary     Status: Abnormal   Collection Time: 07/22/14 12:11 AM  Result Value Ref Range   Glucose-Capillary 151 (H) 70 - 99 mg/dL  Glucose, capillary     Status: Abnormal   Collection Time: 07/22/14  4:07 AM  Result Value Ref Range   Glucose-Capillary 136 (H) 70 - 99  mg/dL  CBC WITH DIFFERENTIAL     Status: Abnormal   Collection Time: 07/22/14  4:10 AM  Result Value Ref Range   WBC 9.7 4.0 - 10.5 K/uL   RBC 4.36 3.87 - 5.11 MIL/uL   Hemoglobin 10.8 (L) 12.0 - 15.0 g/dL   HCT 35.5 (L) 36.0 - 46.0 %   MCV 81.4 78.0 - 100.0 fL   MCH 24.8 (L) 26.0 - 34.0 pg   MCHC 30.4 30.0 - 36.0 g/dL   RDW 16.2 (H) 11.5 - 15.5 %   Platelets 312 150 - 400 K/uL   Neutrophils Relative % 83 (H) 43 - 77 %   Neutro Abs 8.0 (H) 1.7 - 7.7 K/uL   Lymphocytes Relative 9 (L) 12 - 46 %   Lymphs Abs 0.8 0.7 - 4.0 K/uL   Monocytes Relative 8 3 - 12 %   Monocytes Absolute 0.8 0.1 - 1.0 K/uL   Eosinophils Relative 0 0 - 5 %   Eosinophils Absolute 0.0 0.0 - 0.7 K/uL   Basophils Relative 0 0 - 1 %   Basophils Absolute 0.0 0.0 - 0.1 K/uL    Radiology/Results: No results found.  Anti-infectives: Anti-infectives    Start     Dose/Rate Route Frequency Ordered Stop   07/21/14 0849  cefOXitin (MEFOXIN) 2 g in dextrose 5 % 50 mL IVPB     2 g100 mL/hr over 30  Minutes Intravenous On call to O.R. 07/21/14 0849 07/21/14 1316      Assessment/Plan: Problem List: Patient Active Problem List   Diagnosis Date Noted  . 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 Postoperative anemia due to acute blood loss 02/21/2013  . 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 DEGENERATIVE JOINT DISEASE 01/09/2009  . HYPOTHYROIDISM 01/06/2009  . DYSLIPIDEMIA 01/06/2009  . OBESITY, MORBID 01/06/2009  . HYPERTENSION 01/06/2009  . CAD 01/06/2009  . GASTROESOPHAGEAL REFLUX DISEASE 01/06/2009  . SLEEP APNEA 01/06/2009  . DYSPNEA 01/06/2009  . DIVERTICULITIS, HX OF 01/06/2009    UGI pending.  Stable.   1 Day Post-Op    LOS: 1 day   Matt B. Hassell Done, MD, [facility] [phone] beeper [phone]  07/22/2014 7:18 AM

## 2014-07-22 NOTE — Progress Notes (Signed)
Patient alert and oriented, Post op day 1.  Provided support and encouragement.  Encouraged pulmonary toilet, ambulation and small sips of liquids.  All questions answered.  Will continue to monitor. 

## 2014-07-23 LAB — CBC WITH DIFFERENTIAL/PLATELET
BASOS PCT: 0 % (ref 0–1)
Basophils Absolute: 0 10*3/uL (ref 0.0–0.1)
Eosinophils Absolute: 0.1 10*3/uL (ref 0.0–0.7)
Eosinophils Relative: 2 % (ref 0–5)
HCT: 36 % (ref 36.0–46.0)
HEMOGLOBIN: 11.1 g/dL — AB (ref 12.0–15.0)
LYMPHS ABS: 1.1 10*3/uL (ref 0.7–4.0)
Lymphocytes Relative: 17 % (ref 12–46)
MCH: 25 pg — ABNORMAL LOW (ref 26.0–34.0)
MCHC: 30.8 g/dL (ref 30.0–36.0)
MCV: 81.1 fL (ref 78.0–100.0)
MONOS PCT: 17 % — AB (ref 3–12)
Monocytes Absolute: 1.1 10*3/uL — ABNORMAL HIGH (ref 0.1–1.0)
NEUTROS ABS: 4.1 10*3/uL (ref 1.7–7.7)
NEUTROS PCT: 64 % (ref 43–77)
Platelets: 289 10*3/uL (ref 150–400)
RBC: 4.44 MIL/uL (ref 3.87–5.11)
RDW: 16.6 % — ABNORMAL HIGH (ref 11.5–15.5)
WBC: 6.5 10*3/uL (ref 4.0–10.5)

## 2014-07-23 LAB — GLUCOSE, CAPILLARY
GLUCOSE-CAPILLARY: 103 mg/dL — AB (ref 70–99)
GLUCOSE-CAPILLARY: 103 mg/dL — AB (ref 70–99)
Glucose-Capillary: 136 mg/dL — ABNORMAL HIGH (ref 70–99)

## 2014-07-23 MED ORDER — HYDROCODONE-ACETAMINOPHEN 7.5-325 MG/15ML PO SOLN: 10.0000 mL | Freq: Four times a day (QID) | ORAL | Status: DC | PRN

## 2014-07-23 MED ORDER — HYDROMORPHONE HCL 1 MG/ML IJ SOLN: 0.5000 mg | INTRAMUSCULAR | Status: DC | PRN

## 2014-07-23 NOTE — Progress Notes (Signed)
Discharge instructions given to patient. Questions answered 

## 2014-07-23 NOTE — Discharge Instructions (Signed)

## 2014-07-23 NOTE — Progress Notes (Signed)
Patient alert and oriented, pain is controlled. Patient is tolerating fluids,  advanced to protein shake today, patient tolerated well.  Reviewed Gastric sleeve discharge instructions with patient and patient is able to articulate understanding.  Provided information on BELT program, Support Group and [facility] outpatient pharmacy. All questions answered, will continue to monitor.  

## 2014-07-23 NOTE — Discharge Summary (Signed)
Physician Discharge Summary  Patient ID: Andrea NgGloria O Gora MRN: [id number] DOB/AGE: [birth year] 68 y.o.  Admit date: 07/21/2014 Discharge date: 07/23/2014  Admission Diagnoses:  Morbid obesity  Discharge Diagnoses:  same  Active Problems:   S/P laparoscopic sleeve gastrectomy Dec 2015   Surgery:  Lap sleeve gastrectomy with repair of hiatus hernia  Discharged Condition: improved  Hospital Course:   Had surgery.  Swallow on PD 1 looked good. Ready for discharge  Consults: none  Significant Diagnostic Studies: UGI    Discharge Exam: Blood pressure 133/70, pulse 94, temperature 98.7 F (37.1 C), temperature source Oral, resp. rate 18, height 5\' 4"  (1.626 m), weight 292 lb 5.3 oz (132.6 kg), SpO2 96 %. Incision OK  Disposition: 01-Home or Self Care  Discharge Instructions    Discharge instructions    Complete by:  As directed   Follow bariatric dietary guidelines     Increase activity slowly    Complete by:  As directed             Medication List    TAKE these medications        aspirin EC 81 MG tablet  Take 81 mg by mouth daily.     cyanocobalamin 1000 MCG/ML injection  Commonly known as:  (VITAMIN B-12)  Inject 1,000 mcg into the skin every 30 (thirty) days.     escitalopram 10 MG tablet  Commonly known as:  LEXAPRO  Take 10 mg by mouth at bedtime.     furosemide 20 MG tablet  Commonly known as:  LASIX  Take 20 mg by mouth at bedtime.     gabapentin 100 MG capsule  Commonly known as:  NEURONTIN  Take 300 mg by mouth 2 (two) times daily.     levothyroxine 125 MCG tablet  Commonly known as:  SYNTHROID, LEVOTHROID  Take 125 mcg by mouth daily before breakfast. Brand Name Only     lisinopril 10 MG tablet  Commonly known as:  PRINIVIL,ZESTRIL  Take 10 mg by mouth daily with lunch.     metFORMIN 500 MG tablet  Commonly known as:  GLUCOPHAGE  Take 500 mg by mouth daily with breakfast.     methocarbamol 500 MG tablet  Commonly known as:  ROBAXIN   Take 1 tablet (500 mg total) by mouth every 6 (six) hours as needed for muscle spasms.     metoprolol succinate 25 MG 24 hr tablet  Commonly known as:  TOPROL-XL  Take 25 mg by mouth daily with lunch.     oxyCODONE 5 MG immediate release tablet  Commonly known as:  Oxy IR/ROXICODONE  Take 1-2 tablets (5-10 mg total) by mouth every 3 (three) hours as needed for moderate pain, severe pain or breakthrough pain.     pantoprazole 40 MG tablet  Commonly known as:  PROTONIX  Take 40 mg by mouth at bedtime.     potassium chloride SA 20 MEQ tablet  Commonly known as:  K-DUR,KLOR-CON  Take 20 mEq by mouth daily with breakfast.     simvastatin 40 MG tablet  Commonly known as:  ZOCOR  Take 40 mg by mouth every morning.     Vitamin D (Ergocalciferol) 50000 UNITS Caps capsule  Commonly known as:  DRISDOL  Take 50,000 Units by mouth every 7 (seven) days. Fridays         Signed: Luretha MurphyMARTIN,Zayveon Raschke B 07/23/2014, 8:29 AM

## 2014-07-28 ENCOUNTER — Telehealth (HOSPITAL_COMMUNITY): Payer: Self-pay

## 2014-07-28 NOTE — Telephone Encounter (Signed)
Made discharge phone call to patient per DROP protocol. Asking the following questions.    1. Do you have someone to care for you now that you are home?  yes 2. Are you having pain now that is not relieved by your pain medication?  no 3. Are you able to drink the recommended daily amount of fluids (48 ounces minimum/day) and protein (60-80 grams/day) as prescribed by the dietitian or nutritional counselor?  Yes, as good as I can, some days are better than others 4. Are you taking the vitamins and minerals as prescribed?  yes 5. Do you have the "on call" number to contact your surgeon if you have a problem or question?  yes 6. Are your incisions free of redness, swelling or drainage? (If steri strips, address that these can fall off, shower as tolerated) yes 7. Have your bowels moved since your surgery?  If not, are you passing gas?  yes 8. Are you up and walking 3-4 times per day?  No, knee is hurting and I get out of breath    1. Do you have an appointment made to see your surgeon in the next month?  yes 2. Were you provided your discharge medications before your surgery or before you were discharged from the hospital and are you taking them without problem?  yes 3. Were you provided phone numbers to the clinic/surgeon's office?  yes 4. Did you watch the patient education video module in the (clinic, surgeon's office, etc.) before your surgery? yes 5. Do you have a discharge checklist that was provided to you in the hospital to reference with instructions on how to take care of yourself after surgery?  yes 6. Did you see a dietitian or nutritional counselor while you were in the hospital?  yes 7. Do you have an appointment to see a dietitian or nutritional counselor in the next month? yes

## 2014-08-05 ENCOUNTER — Encounter: Payer: Medicare Other | Attending: Surgery

## 2014-08-05 DIAGNOSIS — Z713 Dietary counseling and surveillance: Secondary | ICD-10-CM | POA: Insufficient documentation

## 2014-08-05 DIAGNOSIS — E119 Type 2 diabetes mellitus without complications: Secondary | ICD-10-CM | POA: Insufficient documentation

## 2014-08-05 DIAGNOSIS — Z6841 Body Mass Index (BMI) 40.0 and over, adult: Secondary | ICD-10-CM | POA: Diagnosis not present

## 2014-08-05 NOTE — Progress Notes (Signed)
Bariatric Class:  Appt start time: 1530 end time:  1630.  2 Week Post-Operative Nutrition Class  Patient was seen on 08/05/14 for Post-Operative Nutrition [facility]   Surgery date: 07/21/14 Surgery type: Gastric sleeve Start weight at [facility]: 288 lbs on 12/28/13 Weight today: 283.5 lbs  Weight change: 16.5 lbs  TANITA  BODY COMP RESULTS  07/07/14 08/05/14   BMI (kg/m^2) 51.5 48.7   Fat Mass (lbs) 156.5 151   Fat Free Mass (lbs) 143.5 132.5   Total Body Water (lbs) 105 97.0    The following the learning objectives were met by the patient during this course:  Identifies Phase 3A (Soft, High Proteins) Dietary Goals and will begin from 2 weeks post-operatively to 2 months post-operatively  Identifies appropriate sources of fluids and proteins   States protein recommendations and appropriate sources post-operatively  Identifies the need for appropriate texture modifications, mastication, and bite sizes when consuming solids  Identifies appropriate multivitamin and calcium sources post-operatively  Describes the need for physical activity post-operatively and will follow MD recommendations  States when to call healthcare provider regarding medication questions or post-operative complications  Handouts given during class include:  Phase 3A: Soft, High Protein Diet Handout  Follow-Up Plan: Patient will follow-up at [facility] in 6 weeks for 2 month post-op nutrition visit for diet advancement per MD.

## 2014-09-15 ENCOUNTER — Encounter: Payer: Medicare Other | Attending: Surgery | Admitting: Dietician

## 2014-09-15 DIAGNOSIS — Z6841 Body Mass Index (BMI) 40.0 and over, adult: Secondary | ICD-10-CM | POA: Insufficient documentation

## 2014-09-15 DIAGNOSIS — Z713 Dietary counseling and surveillance: Secondary | ICD-10-CM | POA: Diagnosis not present

## 2014-09-15 DIAGNOSIS — E119 Type 2 diabetes mellitus without complications: Secondary | ICD-10-CM | POA: Diagnosis not present

## 2014-09-15 NOTE — Patient Instructions (Addendum)
Goals:  Follow Phase 3B: High Protein + Non-Starchy Vegetables  Eat 3-6 small meals/snacks, every 3-5 hrs  Increase lean protein foods to meet 60g goal  Increase fluid intake to 64oz +  Avoid drinking 15 minutes before, during and 30 minutes after eating  Aim for >30 min of physical activity daily  Add a high protein snack between meals if needed: yogurt, deli meat and cheese  Unjury PB2 cups  TANITA  BODY COMP RESULTS  07/07/14 08/05/14 09/15/14   BMI (kg/m^2) 51.5 48.7 45.9   Fat Mass (lbs) 156.5 151 139.5   Fat Free Mass (lbs) 143.5 132.5 128   Total Body Water (lbs) 105 97.0 93.5

## 2014-09-15 NOTE — Progress Notes (Signed)
  Follow-up visit:  8 Weeks Post-Operative Gastric Sleeve Surgery  Medical Nutrition Therapy:  Appt start time: 1400 end time:  1430.  Primary concerns today: Post-operative Bariatric Surgery Nutrition Management. Andrea Norton reports that she is feeling well overall. Vomited 1x when she ate too much. Tolerating all recommended foods.  Surgery date: 07/21/14 Surgery type: Gastric sleeve [facility]: 288 lbs on 12/28/13 Weight today: 267.5 lbs Weight change: 16 lbs Total weight lost: 20.5 lbs  TANITA  BODY COMP RESULTS  07/07/14 08/05/14 09/15/14   BMI (kg/m^2) 51.5 48.7 45.9   Fat Mass (lbs) 156.5 151 139.5   Fat Free Mass (lbs) 143.5 132.5 128   Total Body Water (lbs) 105 97.0 93.5    Preferred Learning Style  No preference indicated   Learning Readiness:   Ready  24-hr recall: B (AM): 1 boiled egg or scrambled egg with cheese and 2 strips of [place] bacon or lite spam (14g) Snk (AM):   L (PM): 1/3 large [employer] chili OR 2-3 oz baked or boiled chicken with green beans or pinto beans (14-21g) Snk (PM):   D (PM): *see lunch; sometimes lean hamburger patty (14-21g) Snk (PM):   Fluid intake: flavored water, unsweetened tea with Splenda, decaf coffee (not getting 64 oz) Estimated total protein intake: ~56g  Medications: no longer on Metformin or diuretic; on 1/2 of statin Supplementation: taking  Using straws: no Drinking while eating: no, sometimes takes a sip of coffee with breakfast Hair loss: unknown  Carbonated beverages: no N/V/D/C: mild constipation, takes Miralax or [place]  Dumping syndrome: none  Recent physical activity:  none  Progress Towards Goal(s):  In progress.  Handouts given during visit include:  Phase 3B lean protein + non starchy vegetables  Bariatric snack list   Nutritional Diagnosis:  [place]-3.3 Overweight/obesity related to past poor dietary habits and physical inactivity as evidenced by patient w/ recent gastric sleeve surgery following  dietary guidelines for continued weight loss.     Intervention:  Nutrition counseling provided.  Teaching Method Utilized:  Visual Auditory Hands on  Barriers to learning/adherence to lifestyle change: knee pain  Demonstrated degree of understanding via:  Teach Back   Monitoring/Evaluation:  Dietary intake, exercise, and body weight. Follow up in 6 weeks for 3 month post-op visit.

## 2014-10-23 ENCOUNTER — Encounter: Payer: Medicare Other | Attending: Surgery | Admitting: Dietician

## 2014-10-23 DIAGNOSIS — Z6841 Body Mass Index (BMI) 40.0 and over, adult: Secondary | ICD-10-CM | POA: Insufficient documentation

## 2014-10-23 DIAGNOSIS — E119 Type 2 diabetes mellitus without complications: Secondary | ICD-10-CM | POA: Insufficient documentation

## 2014-10-23 DIAGNOSIS — Z713 Dietary counseling and surveillance: Secondary | ICD-10-CM | POA: Diagnosis not present

## 2014-10-23 NOTE — Progress Notes (Signed)
  Follow-up visit:  3 months Post-Operative Gastric Sleeve Surgery  Medical Nutrition Therapy:  Appt start time: 1400 end time:  1420.  Primary concerns today: Post-operative Bariatric Surgery Nutrition Management. Andrea Norton returns having lost another 13.5 lbs. She reports eating small portions several (4-5) times a day and meeting her protein needs.  Surgery date: 07/21/14 Surgery type: Gastric sleeve [facility]: 288 lbs on 12/28/13 Weight today: 254 lbs Weight change: 13.5 lbs Total weight lost: 34 lbs  TANITA  BODY COMP RESULTS  07/07/14 08/05/14 09/15/14 10/23/14   BMI (kg/m^2) 51.5 48.7 45.9 43.6   Fat Mass (lbs) 156.5 151 139.5 130.5   Fat Free Mass (lbs) 143.5 132.5 128 123.5   Total Body Water (lbs) 105 97.0 93.5 90.5    Preferred Learning Style  No preference indicated   Learning Readiness:   Ready  24-hr recall: B (AM): 1 boiled egg or scrambled egg with cheese and 2 strips of [place] bacon (14g) Snk (AM):  yogurt L (PM): beans or salad or 1 oz baked chicken  Snk (PM): 1 oz chicken  D (PM): *see lunch; sometimes lean hamburger patty (14-21g) Snk (PM): jello and sugar free popsicles  Fluid intake: flavored water, unsweetened tea with Splenda, decaf coffee (not getting 64 oz) Estimated total protein intake: 60+g  Medications: no longer on Metformin or diuretic; on 1/2 of statin Supplementation: taking  Using straws: no Drinking while eating: no, sometimes takes a sip of coffee with breakfast Hair loss: yes, thinning on top Carbonated beverages: no N/V/D/C: constipation resolving Dumping syndrome: none  Recent physical activity:  none  Progress Towards Goal(s):  In progress.   Nutritional Diagnosis:  [place]-3.3 Overweight/obesity related to past poor dietary habits and physical inactivity as evidenced by patient w/ recent gastric sleeve surgery following dietary guidelines for continued weight loss.     Intervention:  Nutrition counseling  provided.  Teaching Method Utilized:  Visual Auditory Hands on  Barriers to learning/adherence to lifestyle change: knee pain  Demonstrated degree of understanding via:  Teach Back   Monitoring/Evaluation:  Dietary intake, exercise, and body weight. Follow up in 3 months for 6 month post-op visit.

## 2014-10-23 NOTE — Patient Instructions (Addendum)
Goals:  Follow Phase 3B: High Protein + Non-Starchy Vegetables  Eat 3-6 small meals/snacks, every 3-5 hrs  Increase lean protein foods to meet 60g goal  Increase fluid intake to 64oz +  Avoid drinking 15 minutes before, during and 30 minutes after eating  Aim for >30 min of physical activity daily  Continue to eat several times a day  Limit carbs like crackers and sweets  Keep carbs to 15 grams per meal  TANITA  BODY COMP RESULTS  07/07/14 08/05/14 09/15/14 10/23/14   BMI (kg/m^2) 51.5 48.7 45.9 43.6   Fat Mass (lbs) 156.5 151 139.5 130.5   Fat Free Mass (lbs) 143.5 132.5 128 123.5   Total Body Water (lbs) 105 97.0 93.5 90.5

## 2014-12-01 ENCOUNTER — Ambulatory Visit: Payer: Self-pay | Admitting: Orthopedic Surgery

## 2014-12-01 NOTE — Progress Notes (Signed)
Preoperative surgical orders have been place into the Epic hospital system for Lear NgGloria O [place] on 12/01/2014, 12:56 PM  by Patrica DuelPERKINS, Simmie Garin for surgery on 12-15-2014.  Preop Knee Scope orders including IV Tylenol and IV Decadron as long as there are no contraindications to the above medications. Avel Peacerew Jamilynn Whitacre, PA-C

## 2014-12-08 NOTE — Patient Instructions (Addendum)
Andrea Norton  12/08/2014   Your procedure is scheduled on:     12/15/14    [facility] and follow signs to               Short Stay Center at     245pm  Call this number if you have problems the morning of surgery [phone]   Remember: ONLY 1 PERSON MAY GO WITH YOU TO SHORT STAY TO GET  READY MORNING OF YOUR SURGERY.  Do not eat food after midnite.  May have clear liquids from 12 midnite until 1100am then nothing by mouth.       Take these medicines the morning of surgery with A SIP OF WATER: Metoprolol ( Toprol), Protonix , Synthroid                               You may not have any metal on your body including hair pins and              piercings  Do not wear jewelry, make-up, lotions, powders or perfumes, deodorant             Do not wear nail polish.  Do not shave  48 hours prior to surgery.                 Do not bring valuables to the hospital. [facility] IS NOT             RESPONSIBLE   FOR VALUABLES.  Contacts, dentures or bridgework may not be worn into surgery.  .     Patients discharged the day of surgery will not be allowed to drive home.  Name and phone number of your driver:  Special Instructions: coughing and deep breathing exercises, leg exercises               Please read over the following fact sheets you were given: _____________________________________________________________________             [facility] - Preparing for Surgery Before surgery, you can play an important role.  Because skin is not sterile, your skin needs to be as free of germs as possible.  You can reduce the number of germs on your skin by washing with CHG (chlorahexidine gluconate) soap before surgery.  CHG is an antiseptic cleaner which kills germs and bonds with the skin to continue killing germs even after washing. Please DO NOT use if you have an allergy to CHG or antibacterial soaps.  If your skin becomes reddened/irritated stop  using the CHG and inform your nurse when you arrive at Short Stay. Do not shave (including legs and underarms) for at least 48 hours prior to the first CHG shower.  You may shave your face/neck. Please follow these instructions carefully:  1.  Shower with CHG Soap the night before surgery and the  morning of Surgery.  2.  If you choose to wash your hair, wash your hair first as usual with your  normal  shampoo.  3.  After you shampoo, rinse your hair and body thoroughly to remove the  shampoo.                           4.  Use CHG as you would any other liquid soap.  You can apply chg directly  to the skin and wash                       Gently with a scrungie or clean washcloth.  5.  Apply the CHG Soap to your body ONLY FROM THE NECK DOWN.   Do not use on face/ open                           Wound or open sores. Avoid contact with eyes, ears mouth and genitals (private parts).                       Wash face,  Genitals (private parts) with your normal soap.             6.  Wash thoroughly, paying special attention to the area where your surgery  will be performed.  7.  Thoroughly rinse your body with warm water from the neck down.  8.  DO NOT shower/wash with your normal soap after using and rinsing off  the CHG Soap.                9.  Pat yourself dry with a clean towel.            10.  Wear clean pajamas.            11.  Place clean sheets on your bed the night of your first shower and do not  sleep with pets. Day of Surgery : Do not apply any lotions/deodorants the morning of surgery.  Please wear clean clothes to the hospital/surgery center.  FAILURE TO FOLLOW THESE INSTRUCTIONS MAY RESULT IN THE CANCELLATION OF YOUR SURGERY PATIENT SIGNATURE_________________________________  NURSE SIGNATURE__________________________________  ________________________________________________________________________   [name]  An incentive spirometer is a tool that can help keep your  lungs clear and active. This tool measures how well you are filling your lungs with each breath. Taking long deep breaths may help reverse or decrease the chance of developing breathing (pulmonary) problems (especially infection) following:  A long period of time when you are unable to move or be active. BEFORE THE PROCEDURE   If the spirometer includes an indicator to show your best effort, your nurse or respiratory therapist will set it to a desired goal.  If possible, sit up straight or lean slightly forward. Try not to slouch.  Hold the incentive spirometer in an upright position. INSTRUCTIONS FOR USE  1. Sit on the edge of your bed if possible, or sit up as far as you can in bed or on a chair. 2. Hold the incentive spirometer in an upright position. 3. Breathe out normally. 4. Place the mouthpiece in your mouth and seal your lips tightly around it. 5. Breathe in slowly and as deeply as possible, raising the piston or the ball toward the top of the column. 6. Hold your breath for 3-5 seconds or for as long as possible. Allow the piston or ball to fall to the bottom of the column. 7. Remove the mouthpiece from your mouth and breathe out normally. 8. Rest for a few seconds and repeat Steps 1 through 7 at least 10 times every 1-2 hours when you are awake. Take your time and take a few normal breaths between deep breaths. 9. The spirometer may include an indicator to show your best effort. Use the indicator as a goal to work toward  during each repetition. 10. After each set of 10 deep breaths, practice coughing to be sure your lungs are clear. If you have an incision (the cut made at the time of surgery), support your incision when coughing by placing a pillow or rolled up towels firmly against it. Once you are able to get out of bed, walk around indoors and cough well. You may stop using the incentive spirometer when instructed by your caregiver.  RISKS AND COMPLICATIONS  Take your time so  you do not get dizzy or light-headed.  If you are in pain, you may need to take or ask for pain medication before doing incentive spirometry. It is harder to take a deep breath if you are having pain. AFTER USE  Rest and breathe slowly and easily.  It can be helpful to keep track of a log of your progress. Your caregiver can provide you with a simple table to help with this. If you are using the spirometer at home, follow these instructions: SEEK MEDICAL CARE IF:   You are having difficultly using the spirometer.  You have trouble using the spirometer as often as instructed.  Your pain medication is not giving enough relief while using the spirometer.  You develop fever of 100.5 F (38.1 C) or higher. SEEK IMMEDIATE MEDICAL CARE IF:   You cough up bloody sputum that had not been present before.  You develop fever of 102 F (38.9 C) or greater.  You develop worsening pain at or near the incision site. MAKE SURE YOU:   Understand these instructions.  Will watch your condition.  Will get help right away if you are not doing well or get worse. Document Released: 11/21/2006 Document Revised: 10/03/2011 Document Reviewed: 01/22/2007 Exit[employer] Patient Information 2014 [employer], [place].   ________________________________________________________________________    CLEAR LIQUID DIET   Foods Allowed                                                                     Foods Excluded  Coffee and tea, regular and decaf                             liquids that you cannot  Plain Jell-O in any flavor                                             see through such as: Fruit ices (not with fruit pulp)                                     milk, soups, orange juice  Iced Popsicles                                    All solid food Carbonated beverages, regular and diet  Cranberry, grape and apple juices Sports drinks like Gatorade Lightly seasoned clear  broth or consume(fat free) Sugar, honey syrup  Sample Menu Breakfast                                Lunch                                     Supper Cranberry juice                    Beef broth                            Chicken broth Jell-O                                     Grape juice                           Apple juice Coffee or tea                        Jell-O                                      Popsicle                                                Coffee or tea                        Coffee or tea  _____________________________________________________________________

## 2014-12-09 ENCOUNTER — Encounter (HOSPITAL_COMMUNITY): Payer: Self-pay

## 2014-12-09 ENCOUNTER — Encounter (HOSPITAL_COMMUNITY)
Admission: RE | Admit: 2014-12-09 | Discharge: 2014-12-09 | Disposition: A | Discharge status: Home / Self Care | Payer: Medicare Other | Source: Ambulatory Visit | Attending: Orthopedic Surgery | Admitting: Orthopedic Surgery

## 2014-12-09 DIAGNOSIS — Z01818 Encounter for other preprocedural examination: Secondary | ICD-10-CM | POA: Diagnosis not present

## 2014-12-09 NOTE — Progress Notes (Signed)
Clarified dose of gabapentin with patient by phone.

## 2014-12-09 NOTE — Progress Notes (Signed)
LOV note Care Every[facility]26[facility] and includes labs of CBC and CMP done 11-20-14 EKG 01-14-14 - [facility] CXR -01-14-14 [facility]

## 2014-12-12 NOTE — Progress Notes (Signed)
Patient reports she has developed a cough and been to see MD on 12/12/2014.  PCP patient states believes it is from her Lisinopril and plans to take her off of Lisinopril per patient.

## 2014-12-12 NOTE — Progress Notes (Signed)
Patient aware of new date and time for surgery on 12/17/2014 and to arrive at 0630am in Short Stay.   Patient aware nothing to eat or drink after midnite.

## 2014-12-16 DIAGNOSIS — M25869 Other specified joint disorders, unspecified knee: Secondary | ICD-10-CM

## 2014-12-16 DIAGNOSIS — Z96659 Presence of unspecified artificial knee joint: Secondary | ICD-10-CM

## 2014-12-16 NOTE — H&P (Signed)
CC- Andrea Norton is a 69 y.o. female who presents with right knee pain.  HPI- . Knee Pain: Patient presents with knee pain involving the  right knee. Onset of the symptoms was several months ago. Inciting event: none known. Current symptoms include crepitus sensation and foreign body sensation. Pain is aggravated by any weight bearing, going up and down stairs and rising after sitting.  Patient has had prior knee problems. 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   Past Medical History  Diagnosis Date  . Hyperlipidemia   . Status post primary angioplasty with coronary stent     DRUG-ELUTING STENT X1  TO PROXIMAL LAD  . Chronic back pain   . Chronic neck pain   . Arthritis     HANDS, KNEES  . DDD (degenerative disc disease), cervical   . DDD (degenerative disc disease), lumbar   . Radicular pain in left arm OCCASIONAL PAIN SHOOTING DOWN LEFT ARM SECAONDARY TO  CERVICAL DEGENERATION  . Hemorrhoid   . Diverticulitis of colon   . B12 deficiency anemia   . Vitamin D deficiency   . Hypothyroidism   . Synovitis of knee HYPERTROPHIC RIGHT KNEE  . Abnormal stress test 01-22-2009    LOW RISK ADENOSINE NUCLEAR STUDY W/ PROBABLE MILD APICAL THINNING BUT NO ISCHEMIA  . Depression     states well controlled  . GERD (gastroesophageal reflux disease)   . Inguinal hernia, right     with ventral hernia  . Hypertension       PCP Dr Lysbeth GalasNyland    . Hemorrhoids   . Coronary artery disease 2005    STRESS TEST /, NOte Dr Kirtland BouchardHocherin [facility]/ no cardiologist now--STATES HEART STENT PLACED 2010  . Sleep apnea     couldnt tolerate machine- states last sleep study " many years ago"  . Bronchitis     hx of   . H/O hiatal hernia     Past Surgical History  Procedure Laterality Date  . Knee closed reduction  05-07-2009  RIGHT KNEE    LEFT KNEE  10-01-2009  .  Total knee arthroplasty  LEFT  2005    RIGHT  03-13-2009  . Total knee revision  RIGHT 09-30-2009    LEFT 11-25-2009  . Hysteroscopy w/d&c  2002  . Open patellofemoral right knee/ lateral release  05-04-2009  . Knee arthroscopy w/ debridement  01-22-2010    SEPTIC KNEE  . Right knee i & d polythylene revision  02-04-2010    KNEE INFECTED  . Left foot surg.  1990  . Appendectomy  1993  . Knee arthroscopy  LEFT 1999    RIGHT 2004  . Hemicolectomy for diverticulitis  2005  . Knee arthroscopy  08/03/2011    Procedure: ARTHROSCOPY KNEE;  Surgeon: Loanne DrillingFrank V Zuhayr Deeney;  Location: [facility];  Service: Orthopedics;  Laterality: Right;  . Synovectomy  08/03/2011    Procedure: SYNOVECTOMY;  Surgeon: Loanne DrillingFrank V Aariz Maish;  Location: [facility];  Service: Orthopedics;  Laterality: Right;  . I&d knee with poly exchange  02/01/2012    Procedure: IRRIGATION AND DEBRIDEMENT KNEE WITH POLY EXCHANGE;  Surgeon: Loanne DrillingFrank V Rayansh Herbst, MD;  Location: [facility];  Service: Orthopedics;  Laterality: Right;  Right Knee Polyethlene Revision   . Colon surgery    . Total knee revision Right 02/20/2013    Procedure: RIGHT  TOTAL KNEE ARTHROPLASTY REVISION VERSUS RESECTION ARTHROPLASTY;  Surgeon: Loanne Drilling, MD;  Location: [facility];  Service: Orthopedics;  Laterality: Right;  . Joint replacement      right knee   . Coronary angioplasty  2004    DRUG-ELUTING STENT X1 TO PROXIMAL  LAD  . Cataract extraction w/phaco Right 05/06/2014    Procedure: CATARACT EXTRACTION PHACO AND INTRAOCULAR LENS PLACEMENT (IOC);  Surgeon: Loraine Leriche T. Nile Riggs, MD;  Location: [facility];  Service: Ophthalmology;  Laterality: Right;  CDE 6.41  . Total knee revision Right 04/02/2014    Procedure: RIGHT KNEE FEMORAL ARTHROPLASTY REVISION;  Surgeon: Loanne Drilling, MD;  Location: [facility];  Service: Orthopedics;  Laterality: Right;  . Cataract extraction w/phaco Left 05/20/2014    Procedure: CATARACT EXTRACTION PHACO AND INTRAOCULAR LENS  PLACEMENT (IOC);  Surgeon: Loraine Leriche T. Nile Riggs, MD;  Location: [facility];  Service: Ophthalmology;  Laterality: Left;  CDE:4.42  . Laparoscopic gastric sleeve resection N/A 07/21/2014    Procedure: LAPAROSCOPIC GASTRIC SLEEVE RESECTION, TAKEDOWN OF INCARCERATED VENTRAL HERNIA, ENTERAL LYSIS, UPPER ENDOSCOPY;  Surgeon: Valarie Merino, MD;  Location: [facility];  Service: General;  Laterality: N/A;    Prior to Admission medications   Medication Sig Start Date End Date Taking? Authorizing Provider  aspirin EC 81 MG tablet Take 81 mg by mouth at bedtime.    Yes Historical Provider, MD  azithromycin (ZITHROMAX) 250 MG tablet Take 250 mg by mouth daily. 12/01/14  Yes Historical Provider, MD  calcium citrate-vitamin D (CITRACAL+D) 315-200 MG-UNIT per tablet Take 1 tablet by mouth every 3 (three) hours.   Yes Historical Provider, MD  cyanocobalamin (,VITAMIN B-12,) 1000 MCG/ML injection Inject 1,000 mcg into the skin every 30 (thirty) days. 04/01/14  Yes Historical Provider, MD  dextromethorphan-guaiFENesin (MUCINEX DM) 30-600 MG per 12 hr tablet Take 1 tablet by mouth at bedtime.   Yes Historical Provider, MD  escitalopram (LEXAPRO) 10 MG tablet Take 10 mg by mouth at bedtime.   Yes Historical Provider, MD  levothyroxine (SYNTHROID, LEVOTHROID) 125 MCG tablet Take 125 mcg by mouth daily before breakfast. Brand Name Only   Yes Historical Provider, MD  lisinopril (PRINIVIL,ZESTRIL) 10 MG tablet Take 10 mg by mouth daily with lunch.   Yes Historical Provider, MD  metoprolol succinate (TOPROL-XL) 25 MG 24 hr tablet Take 25 mg by mouth daily with lunch.    Yes Historical Provider, MD  oxyCODONE (OXY IR/ROXICODONE) 5 MG immediate release tablet Take 1-2 tablets (5-10 mg total) by mouth every 3 (three) hours as needed for moderate pain, severe pain or breakthrough pain. 04/04/14  Yes [name], PA-C  pantoprazole (PROTONIX) 40 MG tablet Take 40 mg by mouth daily with lunch.    Yes Historical Provider, MD  Pediatric  Multiple Vit-C-FA (FLINSTONES GUMMIES OMEGA-3 DHA PO) Take 1 each by mouth 2 (two) times daily.   Yes Historical Provider, MD  simvastatin (ZOCOR) 20 MG tablet Take 20 mg by mouth every evening.   Yes Historical Provider, MD  Vitamin D, Ergocalciferol, (DRISDOL) 50000 UNITS CAPS capsule Take 50,000 Units by mouth every 7 (seven) days. Fridays   Yes Historical Provider, MD  gabapentin (NEURONTIN) 100 MG capsule Take 200 mg by mouth 2 (two) times daily.    Historical Provider, MD  HYDROcodone-acetaminophen (HYCET) 7.5-325 mg/15 ml solution Take 10 mLs by mouth 4 (four) times daily as needed for moderate pain. Patient not taking: Reported on 12/05/2014 07/23/14 07/23/15  Sherrie George, PA-C  methocarbamol (ROBAXIN) 500 MG tablet  Take 1 tablet (500 mg total) by mouth every 6 (six) hours as needed for muscle spasms. Patient not taking: Reported on 12/05/2014 04/04/14   [name], PA-C   KNEE EXAM antalgic gait,no  effusion, collateral ligaments intact, crepitus on ROM; ROM 5-125  Physical Examination: General appearance - alert, well appearing, and in no distress Mental status - alert, oriented to person, place, and time Chest - clear to auscultation, no wheezes, rales or rhonchi, symmetric air entry Heart - normal rate, regular rhythm, normal S1, S2, no murmurs, rubs, clicks or gallops Abdomen - soft, nontender, nondistended, no masses or organomegaly Neurological - alert, oriented, normal speech, no focal findings or movement disorder noted    Asessment/Plan--- Right knee hypertrophic synovitis - - Plan right knee arthroscopy with synovectomy. Procedure risks and potential comps discussed with patient who elects to proceed. Goals are decreased pain and increased function with a high likelihood of achieving both

## 2014-12-17 ENCOUNTER — Ambulatory Visit (HOSPITAL_COMMUNITY): Payer: Medicare Other | Admitting: Anesthesiology

## 2014-12-17 ENCOUNTER — Encounter (HOSPITAL_COMMUNITY)
Admission: RE | Disposition: A | Discharge status: Home / Self Care | Payer: Self-pay | Source: Ambulatory Visit | Attending: Orthopedic Surgery

## 2014-12-17 ENCOUNTER — Ambulatory Visit (HOSPITAL_COMMUNITY)
Admission: RE | Admit: 2014-12-17 | Discharge: 2014-12-17 | Disposition: A | Discharge status: Home / Self Care | Payer: Medicare Other | Source: Ambulatory Visit | Attending: Orthopedic Surgery | Admitting: Orthopedic Surgery

## 2014-12-17 ENCOUNTER — Encounter (HOSPITAL_COMMUNITY): Payer: Self-pay | Admitting: *Deleted

## 2014-12-17 DIAGNOSIS — M65861 Other synovitis and tenosynovitis, right lower leg: Secondary | ICD-10-CM | POA: Insufficient documentation

## 2014-12-17 DIAGNOSIS — G8929 Other chronic pain: Secondary | ICD-10-CM | POA: Insufficient documentation

## 2014-12-17 DIAGNOSIS — M542 Cervicalgia: Secondary | ICD-10-CM | POA: Diagnosis not present

## 2014-12-17 DIAGNOSIS — M25869 Other specified joint disorders, unspecified knee: Secondary | ICD-10-CM

## 2014-12-17 DIAGNOSIS — E785 Hyperlipidemia, unspecified: Secondary | ICD-10-CM | POA: Diagnosis not present

## 2014-12-17 DIAGNOSIS — F329 Major depressive disorder, single episode, unspecified: Secondary | ICD-10-CM | POA: Diagnosis not present

## 2014-12-17 DIAGNOSIS — K219 Gastro-esophageal reflux disease without esophagitis: Secondary | ICD-10-CM | POA: Insufficient documentation

## 2014-12-17 DIAGNOSIS — M238X1 Other internal derangements of right knee: Secondary | ICD-10-CM | POA: Diagnosis not present

## 2014-12-17 DIAGNOSIS — G4730 Sleep apnea, unspecified: Secondary | ICD-10-CM | POA: Insufficient documentation

## 2014-12-17 DIAGNOSIS — I10 Essential (primary) hypertension: Secondary | ICD-10-CM | POA: Diagnosis not present

## 2014-12-17 DIAGNOSIS — I2510 Atherosclerotic heart disease of native coronary artery without angina pectoris: Secondary | ICD-10-CM | POA: Insufficient documentation

## 2014-12-17 DIAGNOSIS — Z7982 Long term (current) use of aspirin: Secondary | ICD-10-CM | POA: Insufficient documentation

## 2014-12-17 DIAGNOSIS — Z96659 Presence of unspecified artificial knee joint: Secondary | ICD-10-CM

## 2014-12-17 HISTORY — PX: KNEE ARTHROSCOPY: SHX127

## 2014-12-17 SURGERY — ARTHROSCOPY, KNEE
Anesthesia: General | Site: Knee | Laterality: Right

## 2014-12-17 MED ORDER — HYDROCODONE-ACETAMINOPHEN 5-325 MG PO TABS
1.0000 | ORAL_TABLET | Freq: Once | ORAL | Status: AC
Start: 2014-12-17 — End: 2014-12-17
  Administered 2014-12-17: 1 via ORAL

## 2014-12-17 MED ORDER — CEFAZOLIN SODIUM-DEXTROSE 2-3 GM-% IV SOLR
2.0000 g | INTRAVENOUS | Status: AC
  Administered 2014-12-17: 2 g via INTRAVENOUS

## 2014-12-17 MED ORDER — LACTATED RINGERS IR SOLN
Status: DC | PRN
  Administered 2014-12-17 (×3): 3000 mL

## 2014-12-17 MED ORDER — DEXAMETHASONE SODIUM PHOSPHATE 10 MG/ML IJ SOLN
INTRAMUSCULAR | Status: AC
  Filled 2014-12-17: qty 1

## 2014-12-17 MED ORDER — PROPOFOL 10 MG/ML IV BOLUS
INTRAVENOUS | Status: AC
  Filled 2014-12-17: qty 20

## 2014-12-17 MED ORDER — BUPIVACAINE-EPINEPHRINE (PF) 0.25% -1:200000 IJ SOLN
INTRAMUSCULAR | Status: AC
  Filled 2014-12-17: qty 30

## 2014-12-17 MED ORDER — BUPIVACAINE-EPINEPHRINE 0.25% -1:200000 IJ SOLN
INTRAMUSCULAR | Status: DC | PRN
  Administered 2014-12-17: 20 mL

## 2014-12-17 MED ORDER — FENTANYL CITRATE (PF) 100 MCG/2ML IJ SOLN
INTRAMUSCULAR | Status: DC | PRN
  Administered 2014-12-17 (×2): 50 ug via INTRAVENOUS

## 2014-12-17 MED ORDER — MEPERIDINE HCL 50 MG/ML IJ SOLN: 6.2500 mg | INTRAMUSCULAR | Status: DC | PRN

## 2014-12-17 MED ORDER — PROPOFOL 10 MG/ML IV BOLUS
INTRAVENOUS | Status: DC | PRN
  Administered 2014-12-17: 20 mg via INTRAVENOUS
  Administered 2014-12-17: 200 mg via INTRAVENOUS

## 2014-12-17 MED ORDER — MIDAZOLAM HCL 5 MG/5ML IJ SOLN
INTRAMUSCULAR | Status: DC | PRN
  Administered 2014-12-17 (×2): 1 mg via INTRAVENOUS

## 2014-12-17 MED ORDER — MIDAZOLAM HCL 2 MG/2ML IJ SOLN: 0.5000 mg | Freq: Once | INTRAMUSCULAR | Status: DC | PRN

## 2014-12-17 MED ORDER — PROMETHAZINE HCL 25 MG/ML IJ SOLN: 6.2500 mg | INTRAMUSCULAR | Status: DC | PRN

## 2014-12-17 MED ORDER — HYDROCODONE-ACETAMINOPHEN 5-325 MG PO TABS
ORAL_TABLET | ORAL | Status: AC
  Administered 2014-12-17: 1
  Filled 2014-12-17: qty 1

## 2014-12-17 MED ORDER — SODIUM CHLORIDE 0.9 % IJ SOLN
INTRAMUSCULAR | Status: AC
  Filled 2014-12-17: qty 10

## 2014-12-17 MED ORDER — LACTATED RINGERS IV SOLN
INTRAVENOUS | Status: DC | PRN
  Administered 2014-12-17: 09:00:00 via INTRAVENOUS

## 2014-12-17 MED ORDER — ACETAMINOPHEN 10 MG/ML IV SOLN
1000.0000 mg | Freq: Once | INTRAVENOUS | Status: AC
  Administered 2014-12-17: 1000 mg via INTRAVENOUS
  Filled 2014-12-17 (×2): qty 100

## 2014-12-17 MED ORDER — HYDROMORPHONE HCL 1 MG/ML IJ SOLN: 0.2500 mg | INTRAMUSCULAR | Status: DC | PRN

## 2014-12-17 MED ORDER — CEFAZOLIN SODIUM-DEXTROSE 2-3 GM-% IV SOLR
INTRAVENOUS | Status: AC
  Filled 2014-12-17: qty 50

## 2014-12-17 MED ORDER — SODIUM CHLORIDE 0.9 % IV SOLN: INTRAVENOUS | Status: DC

## 2014-12-17 MED ORDER — CHLORHEXIDINE GLUCONATE 4 % EX LIQD: 60.0000 mL | Freq: Once | CUTANEOUS | Status: DC

## 2014-12-17 MED ORDER — HYDROCODONE-ACETAMINOPHEN 5-325 MG PO TABS: 1.0000 | ORAL_TABLET | Freq: Four times a day (QID) | ORAL | Status: DC | PRN

## 2014-12-17 MED ORDER — MIDAZOLAM HCL 2 MG/2ML IJ SOLN
INTRAMUSCULAR | Status: AC
  Filled 2014-12-17: qty 2

## 2014-12-17 MED ORDER — METHOCARBAMOL 500 MG PO TABS
500.0000 mg | ORAL_TABLET | Freq: Once | ORAL | Status: AC
  Administered 2014-12-17: 500 mg via ORAL

## 2014-12-17 MED ORDER — EPHEDRINE SULFATE 50 MG/ML IJ SOLN
INTRAMUSCULAR | Status: AC
  Filled 2014-12-17: qty 1

## 2014-12-17 MED ORDER — DEXAMETHASONE SODIUM PHOSPHATE 10 MG/ML IJ SOLN
10.0000 mg | Freq: Once | INTRAMUSCULAR | Status: AC
  Administered 2014-12-17: 10 mg via INTRAVENOUS

## 2014-12-17 MED ORDER — LIDOCAINE HCL (CARDIAC) 20 MG/ML IV SOLN
INTRAVENOUS | Status: AC
  Filled 2014-12-17: qty 5

## 2014-12-17 MED ORDER — METHOCARBAMOL 500 MG PO TABS: 500.0000 mg | ORAL_TABLET | Freq: Four times a day (QID) | ORAL | Status: DC

## 2014-12-17 MED ORDER — GLYCOPYRROLATE 0.2 MG/ML IJ SOLN
INTRAMUSCULAR | Status: AC
  Filled 2014-12-17: qty 1

## 2014-12-17 MED ORDER — METHOCARBAMOL 500 MG PO TABS
ORAL_TABLET | ORAL | Status: AC
  Administered 2014-12-17: 500 mg
  Filled 2014-12-17: qty 1

## 2014-12-17 MED ORDER — ONDANSETRON HCL 4 MG/2ML IJ SOLN
INTRAMUSCULAR | Status: DC | PRN
  Administered 2014-12-17: 4 mg via INTRAVENOUS

## 2014-12-17 MED ORDER — FENTANYL CITRATE (PF) 100 MCG/2ML IJ SOLN
INTRAMUSCULAR | Status: AC
Start: 2014-12-17 — End: 2014-12-17
  Filled 2014-12-17: qty 2

## 2014-12-17 MED ORDER — GLYCOPYRROLATE 0.2 MG/ML IJ SOLN
INTRAMUSCULAR | Status: DC | PRN
  Administered 2014-12-17: 0.2 mg via INTRAVENOUS

## 2014-12-17 MED ORDER — EPHEDRINE SULFATE 50 MG/ML IJ SOLN
INTRAMUSCULAR | Status: DC | PRN
  Administered 2014-12-17: 5 mg via INTRAVENOUS

## 2014-12-17 SURGICAL SUPPLY — 31 items
BANDAGE ELASTIC 6 VELCRO ST LF (GAUZE/BANDAGES/DRESSINGS) ×2 IMPLANT
BLADE 4.2CUDA (BLADE) ×2 IMPLANT
COVER SURGICAL LIGHT HANDLE (MISCELLANEOUS) ×2 IMPLANT
CUFF TOURN SGL QUICK 34 (TOURNIQUET CUFF) ×2
CUFF TRNQT CYL 34X4X40X1 (TOURNIQUET CUFF) ×1 IMPLANT
DRAPE U-SHAPE 47X51 STRL (DRAPES) ×2 IMPLANT
DRSG EMULSION OIL 3X3 NADH (GAUZE/BANDAGES/DRESSINGS) ×2 IMPLANT
DRSG PAD ABDOMINAL 8X10 ST (GAUZE/BANDAGES/DRESSINGS) ×3 IMPLANT
DURAPREP 26ML APPLICATOR (WOUND CARE) ×2 IMPLANT
GAUZE SPONGE 4X4 12PLY STRL (GAUZE/BANDAGES/DRESSINGS) ×2 IMPLANT
GAUZE SPONGE 4X4 16PLY XRAY LF (GAUZE/BANDAGES/DRESSINGS) ×1 IMPLANT
GLOVE BIO SURGEON STRL SZ8 (GLOVE) ×2 IMPLANT
GLOVE BIOGEL PI IND STRL 8 (GLOVE) ×1 IMPLANT
GLOVE BIOGEL PI INDICATOR 8 (GLOVE) ×1
GOWN STRL REUS W/TWL LRG LVL3 (GOWN DISPOSABLE) ×2 IMPLANT
KIT BASIN OR (CUSTOM PROCEDURE TRAY) ×2 IMPLANT
MANIFOLD NEPTUNE II (INSTRUMENTS) ×2 IMPLANT
MARKER PEN SURG W/LABELS BLK (STERILIZATION PRODUCTS) ×2 IMPLANT
NS IRRIG 1000ML POUR BTL (IV SOLUTION) ×1 IMPLANT
PACK ARTHROSCOPY WL (CUSTOM PROCEDURE TRAY) ×2 IMPLANT
PACK ICE MAXI GEL EZY WRAP (MISCELLANEOUS) ×6 IMPLANT
PAD ABD 8X10 STRL (GAUZE/BANDAGES/DRESSINGS) ×1 IMPLANT
PADDING CAST COTTON 6X4 STRL (CAST SUPPLIES) ×3 IMPLANT
POSITIONER SURGICAL ARM (MISCELLANEOUS) ×2 IMPLANT
SET ARTHROSCOPY TUBING (MISCELLANEOUS) ×2
SET ARTHROSCOPY TUBING LN (MISCELLANEOUS) ×1 IMPLANT
SUT ETHILON 4 0 PS 2 18 (SUTURE) ×2 IMPLANT
TOWEL OR 17X26 10 PK STRL BLUE (TOWEL DISPOSABLE) ×2 IMPLANT
WAND 90 DEG TURBOVAC W/CORD (SURGICAL WAND) IMPLANT
WATER STERILE IRR 500ML POUR (IV SOLUTION) ×1 IMPLANT
WRAP KNEE MAXI GEL POST OP (GAUZE/BANDAGES/DRESSINGS) ×2 IMPLANT

## 2014-12-17 NOTE — Anesthesia Procedure Notes (Signed)
Procedure Name: LMA Insertion Date/Time: 12/17/2014 8:44 AM Performed by: Illene SilverEVANS, Milik Gilreath E Pre-anesthesia Checklist: Emergency Drugs available, Patient identified, Suction available, Patient being monitored and Timeout performed Patient Re-evaluated:Patient Re-evaluated prior to inductionOxygen Delivery Method: Circle system utilized Preoxygenation: Pre-oxygenation with 100% oxygen Intubation Type: IV induction Ventilation: Mask ventilation without difficulty LMA: LMA inserted LMA Size: 4.0 Number of attempts: 1 Dental Injury: Teeth and Oropharynx as per pre-operative assessment

## 2014-12-17 NOTE — Anesthesia Postprocedure Evaluation (Signed)
  Anesthesia Post-op Note  Patient: Andrea Norton  Procedure(s) Performed: Procedure(s): RIGHT ARTHROSCOPY KNEE, SYNOVECTOMY (Right)  Patient Location: PACU  Anesthesia Type:General  Level of Consciousness: awake, alert , oriented and patient cooperative  Airway and Oxygen Therapy: Patient Spontanous Breathing  Post-op Pain: none  Post-op Assessment: Post-op Vital signs reviewed, Patient's Cardiovascular Status Stable, Respiratory Function Stable, Patent Airway, No signs of Nausea or vomiting and Pain level controlled  Post-op Vital Signs: Reviewed and stable  Last Vitals:  Filed Vitals:   12/17/14 1115  BP: 115/65  Pulse: 77  Temp: 36.7 C  Resp: 18    Complications: No apparent anesthesia complications

## 2014-12-17 NOTE — Transfer of Care (Signed)
Immediate Anesthesia Transfer of Care Note  Patient: Andrea Norton  Procedure(s) Performed: Procedure(s): RIGHT ARTHROSCOPY KNEE, SYNOVECTOMY (Right)  Patient Location: PACU  Anesthesia Type:General  Level of Consciousness: awake, alert , oriented and patient cooperative  Airway & Oxygen Therapy: Patient Spontanous Breathing and Patient connected to face mask oxygen  Post-op Assessment: Report given to RN and Post -op Vital signs reviewed and stable  Post vital signs: Reviewed and stable  Last Vitals:  Filed Vitals:   12/17/14 0613  BP: 137/72  Pulse: 74  Temp: 36.4 C  Resp: 18    Complications: No apparent anesthesia complications

## 2014-12-17 NOTE — Discharge Instructions (Signed)
Dr. Gaynelle Arabian Total Joint Specialist [facility] [address], [place], [place] [address] [phone]   Arthroscopic Procedure, Knee An arthroscopic procedure can find what is wrong with your knee. PROCEDURE Arthroscopy is a surgical technique that allows your orthopedic surgeon to diagnose and treat your knee injury with accuracy. They will look into your knee through a small instrument. This is almost like a small (pencil sized) telescope. Because arthroscopy affects your knee less than open knee surgery, you can anticipate a more rapid recovery. Taking an active role by following your caregiver's instructions will help with rapid and complete recovery. Use crutches, rest, elevation, ice, and knee exercises as instructed. The length of recovery depends on various factors including type of injury, age, physical condition, medical conditions, and your rehabilitation. Your knee is the joint between the large bones (femur and tibia) in your leg. Cartilage covers these bone ends which are smooth and slippery and allow your knee to bend and move smoothly. Two menisci, thick, semi-lunar shaped pads of cartilage which form a rim inside the joint, help absorb shock and stabilize your knee. Ligaments bind the bones together and support your knee joint. Muscles move the joint, help support your knee, and take stress off the joint itself. Because of this all programs and physical therapy to rehabilitate an injured or repaired knee require rebuilding and strengthening your muscles. AFTER THE PROCEDURE  After the procedure, you will be moved to a recovery area until most of the effects of the medication have worn off. Your caregiver will discuss the test results with you.   Only take over-the-counter or prescription medicines for pain, discomfort, or fever as directed by your caregiver.  SEEK MEDICAL CARE IF:   You have increased bleeding from your wounds.   You see  redness, swelling, or have increasing pain in your wounds.   You have pus coming from your wound.   You have an oral temperature above 102 F (38.9 C).   You notice a bad smell coming from the wound or dressing.   You have severe pain with any motion of your knee.  SEEK IMMEDIATE MEDICAL CARE IF:   You develop a rash.   You have difficulty breathing.   You have any allergic problems.  FURTHER INSTRUCTIONS:   ICE to the affected knee every three hours for 30 minutes at a time and then as needed for pain and swelling.  Continue to use ice on the knee for pain and swelling from surgery. You may notice swelling that will progress down to the foot and ankle.  This is normal after surgery.  Elevate the leg when you are not up walking on it.    DIET You may resume your previous home diet once your are discharged from the hospital.  DRESSING / WOUND CARE / SHOWERING  You may start showering two days after being discharged home but do not submerge the incisions under water.  Change dressing 48 hours after the procedure and then cover the small incisions with band aids until your follow up visit. Change the surgical dressings daily and reapply a dry dressing each time.   ACTIVITY Walk with your walker as instructed. Use walker as long as suggested by your caregivers. Avoid periods of inactivity such as sitting longer than an hour when not asleep. This helps prevent blood clots.  You may resume a sexual relationship in one month or when given the OK by your doctor.  You may return  to work once you are cleared by your doctor.  Do not drive a car for 6 weeks or until released by you surgeon.  Do not drive while taking narcotics.  WEIGHT BEARING You may bear weight as tolerated on your right leg  POSTOPERATIVE CONSTIPATION PROTOCOL Constipation - defined medically as fewer than three stools per week and severe constipation as less than one stool per week.  One of the most common issues  patients have following surgery is constipation.  Even if you have a regular bowel pattern at home, your normal regimen is likely to be disrupted due to multiple reasons following surgery.  Combination of anesthesia, postoperative narcotics, change in appetite and fluid intake all can affect your bowels.  In order to avoid complications following surgery, here are some recommendations in order to help you during your recovery period.  Colace (docusate) - Pick up an over-the-counter form of Colace or another stool softener and take twice a day as long as you are requiring postoperative pain medications.  Take with a full glass of water daily.  If you experience loose stools or diarrhea, hold the colace until you stool forms back up.  If your symptoms do not get better within 1 week or if they get worse, check with your doctor.  Dulcolax (bisacodyl) - Pick up over-the-counter and take as directed by the product packaging as needed to assist with the movement of your bowels.  Take with a full glass of water.  Use this product as needed if not relieved by Colace only.   MiraLax (polyethylene glycol) - Pick up over-the-counter to have on hand.  MiraLax is a solution that will increase the amount of water in your bowels to assist with bowel movements.  Take as directed and can mix with a glass of water, juice, soda, coffee, or tea.  Take if you go more than two days without a movement. Do not use MiraLax more than once per day. Call your doctor if you are still constipated or irregular after using this medication for 7 days in a row.  If you continue to have problems with postoperative constipation, please contact the office for further assistance and recommendations.  If you experience "the worst abdominal pain ever" or develop nausea or vomiting, please contact the office immediatly for further recommendations for treatment.  ITCHING  If you experience itching with your medications, try taking only a single  pain pill, or even half a pain pill at a time.  You can also use Benadryl over the counter for itching or also to help with sleep.   TED HOSE STOCKINGS Wear the elastic stockings on both legs for three weeks following surgery during the day but you may remove then at night for sleeping.  MEDICATIONS See your medication summary on the After Visit Summary that the nursing staff will review with you prior to discharge.  You may have some home medications which will be placed on hold until you complete the course of blood thinner medication.  It is important for you to complete the blood thinner medication as prescribed by your surgeon.  Continue your approved medications as instructed at time of discharge. Do not drive while taking narcotics.   PRECAUTIONS If you experience chest pain or shortness of breath - call 911 immediately for transfer to the hospital emergency department.  If you develop a fever greater that 101 F, purulent drainage from wound, increased redness or drainage from wound, foul odor from the wound/dressing, or  calf pain - CONTACT YOUR SURGEON.                                                   FOLLOW-UP APPOINTMENTS Make sure you keep all of your appointments after your operation with your surgeon and caregivers. You should call the office at [phone]  and make an appointment for approximately one week after the date of your surgery or on the date instructed by your surgeon outlined in the "After Visit Summary".  RANGE OF MOTION AND STRENGTHENING EXERCISES  Rehabilitation of the knee is important following a knee injury or an operation. After just a few days of immobilization, the muscles of the thigh which control the knee become weakened and shrink (atrophy). Knee exercises are designed to build up the tone and strength of the thigh muscles and to improve knee motion. Often times heat used for twenty to thirty minutes before working out will loosen up your tissues and help  with improving the range of motion but do not use heat for the first two weeks following surgery. These exercises can be done on a training (exercise) mat, on the floor, on a table or on a bed. Use what ever works the best and is most comfortable for you Knee exercises include:  QUAD STRENGTHENING EXERCISES Strengthening Quadriceps Sets  Tighten muscles on top of thigh by pushing knees down into floor or table. Hold for 20 seconds. Repeat 10 times. Do 2 sessions per day.     Strengthening Terminal Knee Extension  With knee bent over bolster, straighten knee by tightening muscle on top of thigh. Be sure to keep bottom of knee on bolster. Hold for 20 seconds. Repeat 10 times. Do 2 sessions per day.   Straight Leg with Bent Knee  Lie on back with opposite leg bent. Keep involved knee slightly bent at knee and raise leg 4-6". Hold for 10 seconds. Repeat 20 times per set. Do 2 sets per session. Do 2 sessions per day.

## 2014-12-17 NOTE — Anesthesia Preprocedure Evaluation (Addendum)
Anesthesia Evaluation  Patient identified by MRN, date of birth, ID band Patient awake    Reviewed: Allergy & Precautions, NPO status , Patient's Chart, lab work & pertinent test results  History of Anesthesia Complications Negative for: history of anesthetic complications  Airway Mallampati: II  TM Distance: >3 FB Neck ROM: Full    Dental  (+) Dental Advisory Given   Pulmonary sleep apnea (does not use CPAP) , former smoker (quit '04),  breath sounds clear to auscultation        Cardiovascular hypertension, Pt. on medications and Pt. on home beta blockers - angina+ CAD and + Cardiac Stents (DES to LAD) Rhythm:Regular Rate:Normal  '10 stress test: EF63%, no ischemia   Neuro/Psych Chronic back pain    GI/Hepatic Neg liver ROS, GERD-  Medicated and Controlled,  Endo/Other  neg diabetesHypothyroidism Morbid obesity  Renal/GU negative Renal ROS     Musculoskeletal  (+) Arthritis -,   Abdominal (+) + obese,   Peds  Hematology   Anesthesia Other Findings   Reproductive/Obstetrics                           Anesthesia Physical Anesthesia Plan  ASA: III  Anesthesia Plan: General   Post-op Pain Management:    Induction: Intravenous  Airway Management Planned: LMA  Additional Equipment:   Intra-op Plan:   Post-operative Plan:   Informed Consent: I have reviewed the patients History and Physical, chart, labs and discussed the procedure including the risks, benefits and alternatives for the proposed anesthesia with the patient or authorized representative who has indicated his/her understanding and acceptance.   Dental advisory given  Plan Discussed with: CRNA and Surgeon  Anesthesia Plan Comments: (Plan routine monitors, GA- LMA OK)        Anesthesia Quick Evaluation

## 2014-12-17 NOTE — Brief Op Note (Signed)
12/17/2014  9:22 AM  PATIENT:  Andrea Norton  69 y.o. female  PRE-OPERATIVE DIAGNOSIS:  RIGHT KNEE HYPERTROPHIC SYNOVITIS  POST-OPERATIVE DIAGNOSIS:  RIGHT KNEE HYPERTROPHIC SYNOVITIS  PROCEDURE:  Procedure(s): RIGHT ARTHROSCOPY KNEE, SYNOVECTOMY (Right)  SURGEON:  Surgeon(s) and Role:    * Ollen GrossFrank Travell Desaulniers, MD - Primary  PHYSICIAN ASSISTANT:   ASSISTANTS: none   ANESTHESIA:   general  EBL:     BLOOD ADMINISTERED:none  DRAINS: none   LOCAL MEDICATIONS USED:  MARCAINE     COUNTS:  YES  TOURNIQUET:    DICTATION: .Other Dictation: Dictation Number [id number]  PLAN OF CARE: Discharge to home after PACU  PATIENT DISPOSITION:  PACU - hemodynamically stable.

## 2014-12-17 NOTE — Op Note (Signed)
NAMMort Sawyers:  Mullane, Kindall                ACCOUNT NO.:  [id number]  MEDICAL RECORD NO.:  [id number]  LOCATION:  [facility]                         FACILITY:  [facility]  PHYSICIAN:  Ollen GrossFrank Anyelina Claycomb, M.D.    DATE OF BIRTH:  [birth year]  DATE OF PROCEDURE:  12/17/2014 DATE OF DISCHARGE:                              OPERATIVE REPORT   PREOPERATIVE DIAGNOSIS:  Right knee hypertrophic synovitis.  POSTOPERATIVE DIAGNOSIS:  Right knee hypertrophic synovitis.  PROCEDURE:  Right knee arthroscopy with synovectomy.  SURGEON:  Ollen GrossFrank Analeah Brame, M.D.  ASSISTANT:  No assistant.  ANESTHESIA:  General.  ESTIMATED BLOOD LOSS:  Minimal.  DRAINS:  None.  COMPLICATIONS:  None.  CONDITION:  Stable to recovery.  BRIEF CLINICAL NOTE:  Ms. Alona BeneJoyce is a 69 year old female, long complex history regards to her left knee.  She had multiple operative procedures.  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  She presents now for arthroscopy and synovectomy.  PROCEDURE IN DETAIL:  After successful administration of general anesthetic, a tourniquet placed high on the right thigh and right lower extremity, prepped and draped in usual sterile fashion.  Standard superomedial, inferolateral incisions were made.  Inflow cannula passed superomedial.  Camera passed inferolateral.  Arthroscopic visualization proceeds.  The suprapatellar pouch was essentially obliterated with hypertrophic synovial tissue.  The superolateral incision was then made with the 3rd portal and dilator placed.  A shaver is used in combination with the ArthroCare to debride that hypertrophic synovial tissue back to normal-appearing tissue and effectively recreate the suprapatellar pouch.  Once this was completed, we also did the same for the medial and lateral gutters and infrapatellar area.  Joint inspected.  No other abnormal  tissue noted.  The arthroscopic equipments were then removed from the lateral portals, which were closed with interrupted 4-0 nylon. A 20 mL of 0.25% Marcaine with epinephrine was injected through the inflow cannula, and that was removed and that portal was closed with nylon.  The incision was cleaned and dried and a bulky sterile dressing applied.  She was then awakened and transported to recovery in stable condition.     Ollen GrossFrank Jazilyn Siegenthaler, M.D.     FA/MEDQ  D:  12/17/2014  T:  12/17/2014  Job:  [id number]

## 2014-12-17 NOTE — Interval H&P Note (Signed)
History and Physical Interval Note:  12/17/2014 8:14 AM  Andrea Norton  has presented today for surgery, with the diagnosis of RIGHT KNEE HYPERTROPHIC SYNOVITIS  The various methods of treatment have been discussed with the patient and family. After consideration of risks, benefits and other options for treatment, the patient has consented to  Procedure(s): RIGHT ARTHROSCOPY KNEE (Right) as a surgical intervention .  The patient's history has been reviewed, patient examined, no change in status, stable for surgery.  I have reviewed the patient's chart and labs.  Questions were answered to the patient's satisfaction.     Loanne DrillingALUISIO,Shelby Peltz V

## 2014-12-18 ENCOUNTER — Encounter (HOSPITAL_COMMUNITY): Payer: Self-pay | Admitting: Orthopedic Surgery

## 2015-01-21 ENCOUNTER — Ambulatory Visit: Payer: Medicare Other | Attending: Orthopedic Surgery | Admitting: Physical Therapy

## 2015-01-21 DIAGNOSIS — M25561 Pain in right knee: Secondary | ICD-10-CM | POA: Insufficient documentation

## 2015-01-21 DIAGNOSIS — R531 Weakness: Secondary | ICD-10-CM | POA: Diagnosis not present

## 2015-01-21 DIAGNOSIS — M25661 Stiffness of right knee, not elsewhere classified: Secondary | ICD-10-CM | POA: Diagnosis not present

## 2015-01-21 DIAGNOSIS — R269 Unspecified abnormalities of gait and mobility: Secondary | ICD-10-CM | POA: Insufficient documentation

## 2015-01-21 NOTE — Patient Instructions (Signed)
Heel Slide   Bend left knee and pull heel toward buttocks. Use sheet, strap, or rope to actively assist knee further into flexion. .  Repeat _10___ times. Do _2___ sessions per day.  LYING DOWN MARCHING:    Lie down. Lift right knee toward chest. Complete 1-3___ sets of _10__ repetitions. Perform 2___ sessions per day.  Copyright  VHI. All rights reserved.   ABDUCTION: Side-Lying (Active)   Lie on left side, top leg straight. Raise top leg as far as possible. . Complete 1-3___ sets of _10__ repetitions. Perform _2__ sessions per day.  http://gtsc.exer.us/94   [employer] Arrow ElectronicsQuad   Place a large can or rolled towel under leg. Straighten knee and leg. Hold __5__ seconds.  Repeat _10-30___ times. Do _2___ sessions per day.  http://gt2.exer.us/366   Copyright  VHI. All rights reserved.   Solon PalmJulie Jaedan Norton, Andrea Norton 01/21/2015 1:53 PM  [facility] [address] [place], [place], [id number] Phone: [phone]   Fax:  [phone]

## 2015-01-21 NOTE — Therapy (Signed)
[facility] [address] [place], [place], [address] Phone: [phone]   Fax:  [phone]  Physical Therapy Evaluation  Patient Details  Name: Andrea Norton MRN: [id number] Date of Birth: [birth year] Referring Provider:  Ollen Gross, MD  Encounter Date: 01/21/2015      PT End of Session - 01/21/15 1310    Visit Number 1   Number of Visits 8   Date for PT Re-Evaluation 02/18/15   PT Start Time 1310   PT Stop Time 1357   PT Time Calculation (min) 47 min   Activity Tolerance Patient tolerated treatment well   Behavior During Therapy [facility] for tasks assessed/performed      Past Medical History  Diagnosis Date  . Hyperlipidemia   . Status post primary angioplasty with coronary stent     DRUG-ELUTING STENT X1  TO PROXIMAL LAD  . Chronic back pain   . Chronic neck pain   . Arthritis     HANDS, KNEES  . DDD (degenerative disc disease), cervical   . DDD (degenerative disc disease), lumbar   . Radicular pain in left arm OCCASIONAL PAIN SHOOTING DOWN LEFT ARM SECAONDARY TO  CERVICAL DEGENERATION  . Hemorrhoid   . Diverticulitis of colon   . B12 deficiency anemia   . Vitamin D deficiency   . Hypothyroidism   . Synovitis of knee HYPERTROPHIC RIGHT KNEE  . Abnormal stress test 01-22-2009    LOW RISK ADENOSINE NUCLEAR STUDY W/ PROBABLE MILD APICAL THINNING BUT NO ISCHEMIA  . Depression     states well controlled  . GERD (gastroesophageal reflux disease)   . Inguinal hernia, right     with ventral hernia  . Hypertension       PCP Dr Lysbeth Galas    . Hemorrhoids   . Coronary artery disease 2005    STRESS TEST /, NOte Dr Kirtland Bouchard EPIC/ no cardiologist now--STATES HEART STENT PLACED 2010  . Sleep apnea     couldnt tolerate machine- states last sleep study " many years ago"  . Bronchitis     hx of   . H/O hiatal hernia     Past Surgical History  Procedure Laterality Date  . Knee closed reduction  05-07-2009  RIGHT KNEE    LEFT  KNEE  10-01-2009  . Total knee arthroplasty  LEFT  2005    RIGHT  03-13-2009  . Total knee revision  RIGHT 09-30-2009    LEFT 11-25-2009  . Hysteroscopy w/d&c  2002  . Open patellofemoral right knee/ lateral release  05-04-2009  . Knee arthroscopy w/ debridement  01-22-2010    SEPTIC KNEE  . Right knee i & d polythylene revision  02-04-2010    KNEE INFECTED  . Left foot surg.  1990  . Appendectomy  1993  . Knee arthroscopy  LEFT 1999    RIGHT 2004  . Hemicolectomy for diverticulitis  2005  . Knee arthroscopy  08/03/2011    Procedure: ARTHROSCOPY KNEE;  Surgeon: Loanne Drilling;  Location: [facility];  Service: Orthopedics;  Laterality: Right;  . Synovectomy  08/03/2011    Procedure: SYNOVECTOMY;  Surgeon: Loanne Drilling;  Location: [facility];  Service: Orthopedics;  Laterality: Right;  . I&d knee with poly exchange  02/01/2012    Procedure: IRRIGATION AND DEBRIDEMENT KNEE WITH POLY EXCHANGE;  Surgeon: Loanne Drilling, MD;  Location: [facility];  Service: Orthopedics;  Laterality: Right;  Right Knee Polyethlene Revision   . Colon surgery    .  Total knee revision Right 02/20/2013    Procedure: RIGHT TOTAL KNEE ARTHROPLASTY REVISION VERSUS RESECTION ARTHROPLASTY;  Surgeon: Loanne DrillingFrank V Aluisio, MD;  Location: [facility];  Service: Orthopedics;  Laterality: Right;  . Joint replacement      right knee   . Coronary angioplasty  2004    DRUG-ELUTING STENT X1 TO PROXIMAL  LAD  . Cataract extraction w/phaco Right 05/06/2014    Procedure: CATARACT EXTRACTION PHACO AND INTRAOCULAR LENS PLACEMENT (IOC);  Surgeon: Loraine LericheMark T. Nile RiggsShapiro, MD;  Location: [facility];  Service: Ophthalmology;  Laterality: Right;  CDE 6.41  . Total knee revision Right 04/02/2014    Procedure: RIGHT KNEE FEMORAL ARTHROPLASTY REVISION;  Surgeon: Loanne DrillingFrank Aluisio V, MD;  Location: [facility];  Service: Orthopedics;  Laterality: Right;  . Cataract extraction w/phaco Left 05/20/2014    Procedure: CATARACT EXTRACTION PHACO AND  INTRAOCULAR LENS PLACEMENT (IOC);  Surgeon: Loraine LericheMark T. Nile RiggsShapiro, MD;  Location: [facility];  Service: Ophthalmology;  Laterality: Left;  CDE:4.42  . Laparoscopic gastric sleeve resection N/A 07/21/2014    Procedure: LAPAROSCOPIC GASTRIC SLEEVE RESECTION, TAKEDOWN OF INCARCERATED VENTRAL HERNIA, ENTERAL LYSIS, UPPER ENDOSCOPY;  Surgeon: Valarie MerinoMatthew B Martin, MD;  Location: [facility];  Service: General;  Laterality: N/A;  . Knee arthroscopy Right 12/17/2014    Procedure: RIGHT ARTHROSCOPY KNEE, SYNOVECTOMY;  Surgeon: Ollen GrossFrank Aluisio, MD;  Location: [facility];  Service: Orthopedics;  Laterality: Right;    There were no vitals filed for this visit.  Visit Diagnosis:  Generalized weakness - Plan: PT plan of care cert/re-cert  Pain in right knee - Plan: PT plan of care cert/re-cert  Stiffness of right knee - Plan: PT plan of care cert/re-cert  Abnormality of gait - Plan: PT plan of care cert/re-cert      Subjective Assessment - 01/21/15 1314    Subjective Patient had right knee arthroscopy 12/08/14. Patient continues to have weakness and pain since the surgery. She also reports cramping from hip down entire leg x 2 since surgery. This occurs when she stands.  She cannot put any pressure through her foot when it happens. Presently amb with SPC in the community and sometimes at home.   Pertinent History bil TKR   Limitations Standing;Walking   How long can you stand comfortably? 15 min   How long can you walk comfortably? 0 min comfortably; tolerates 15 minutes    Patient Stated Goals to get stronger   Currently in Pain? Yes   Pain Score 5    Pain Location Knee   Pain Orientation Right;Lateral   Pain Descriptors / Indicators --  hurt   Pain Onset More than a month ago   Pain Frequency Constant   Aggravating Factors  sit <> stand, in/out of car   Pain Relieving Factors ice   Effect of Pain on Daily Activities does what she can            [facility] - 01/21/15 0001    Assessment   Medical  Diagnosis s/p Rt knee arthroscopy   Onset Date/Surgical Date 12/17/14   Next MD Visit 02/24/15   Prior Therapy none  since surgery   Precautions   Precautions None   Balance Screen   Has the patient fallen in the past 6 months No   Has the patient had a decrease in activity level because of a fear of falling?  No   Is the patient reluctant to leave their home because of a fear of falling?  No   Home Environment  Living Environment --  one level home; no trouble with steps in; step to gait   Observation/Other Assessments   Focus on Therapeutic Outcomes (FOTO)  65% limitation   ROM / Strength   AROM / PROM / Strength AROM;Strength   AROM   AROM Assessment Site Knee   Right/Left Knee Right   Right Knee Extension -3  -1   Right Knee Flexion 100  113   Strength   Strength Assessment Site Hip;Knee   Right/Left Hip Right   Right Hip Flexion 4-/5   Right Hip Extension 5/5   Right Hip ABduction 4-/5   Right/Left Knee Right   Right Knee Flexion 5/5   Right Knee Extension 4-/5  Pain   Flexibility   Soft Tissue Assessment /Muscle Length --  tight right HF   Palpation   Palpation comment marked TTP of right quad/ITB   Functional Gait  Assessment   Gait assessed  --  amb with right trunk lean, lumbar flex, decreased knee flex                   [facility] Adult PT Treatment/Exercise - 01/21/15 0001    Exercises   Exercises Knee/Hip   Knee/Hip Exercises: Stretches   Hip Flexor Stretch --  Multiple HF stretches attempted supine/standing: back pain   Gastroc Stretch 1 rep;30 seconds   Knee/Hip Exercises: Aerobic   Nustep L5 x 10 min                PT Education - 01/21/15 1402    Education provided Yes   Education Details HEP: SAQ, heel slide, supine marching, s/l hip ABD   Person(s) Educated Patient   Methods Explanation;Demonstration;Handout   Comprehension Verbalized understanding;Returned demonstration          PT Short Term Goals - 01/21/15 1307     PT SHORT TERM GOAL #1   Title I with initial HEP   Time 2   Period Weeks   Status New           PT Long Term Goals - 01/21/15 1308    PT LONG TERM GOAL #1   Title I with advanced HEP   Time 6   Period Weeks   Status New   PT LONG TERM GOAL #2   Title improved right knee flex to 115 to improve gait   Time 6   Period Weeks   Status New   PT LONG TERM GOAL #3   Title decreased pain in RLE by 50%   Time 6   Period Weeks   Status New   PT LONG TERM GOAL #4   Title demo improved RLE strength to 4+/5 or better to improve function.   Time 6   Period Weeks   Status New   PT LONG TERM GOAL #5   Title improve FOTO to CK   Time 6   Period Weeks   Status New               Plan - 01/21/15 1403    Clinical Impression Statement Patient is a 69 year old female familiar to this clinic after bil TKR, right TKR revision and s/p Rt knee arthroscopy 12/08/14. Patient presents with c/o pain and weaknes iin RLE and intermittent spasms. She has limited Rt knee ROM, tight hip flexors  and has weakness thoughout RLE. She amb with a SPC with antalgic gait pattern. She uses a rollator walker in the community as well as needed. Patient  will benefit from PT for strengthening and ROM to reduce fall risk and increase function.   Pt will benefit from skilled therapeutic intervention in order to improve on the following deficits Abnormal gait;Difficulty walking;Decreased range of motion;Obesity;Decreased activity tolerance;Pain;Decreased mobility;Decreased strength   Rehab Potential Good   PT Frequency 2x / week   PT Duration 6 weeks   PT Treatment/Interventions Cryotherapy;Electrical Stimulation;ADLs/Self Care Home Management;Ultrasound;Gait training;Therapeutic exercise;Balance training;Neuromuscular re-education;Patient/family education;Passive range of motion;Manual techniques;Vasopneumatic Device   PT Next Visit Plan review HEP; add gastroc stretch, weight shifting, calf raises, LE  strengthening, Manual (ITB prn)   Consulted and Agree with Plan of Care Patient          G-Codes - 2015-02-15 1413    Functional Assessment Tool Used FOTO = 65% limited   Functional Limitation Mobility: Walking and moving around   Mobility: Walking and Moving Around Current Status [phone]) At least 60 percent but less than 80 percent impaired, limited or restricted   Mobility: Walking and Moving Around Goal Status [phone]) At least 40 percent but less than 60 percent impaired, limited or restricted       Problem List Patient Active Problem List   Diagnosis Date Noted  . 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 Postoperative anemia due to acute blood loss 02/21/2013  . 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 DEGENERATIVE JOINT DISEASE 01/09/2009  . HYPOTHYROIDISM 01/06/2009  . DYSLIPIDEMIA 01/06/2009  . OBESITY, MORBID 01/06/2009  . HYPERTENSION 01/06/2009  . CAD 01/06/2009  . GASTROESOPHAGEAL REFLUX DISEASE 01/06/2009  . SLEEP APNEA 01/06/2009  . DYSPNEA 01/06/2009  . DIVERTICULITIS, HX OF 01/06/2009    [name] PT  February 15, 2015, 2:17 PM  [facility] [address] [place], [place], [address] Phone: [phone]   Fax:  [phone]

## 2015-01-22 ENCOUNTER — Ambulatory Visit: Payer: Medicare Other | Admitting: Dietician

## 2015-01-23 ENCOUNTER — Encounter: Payer: Medicare Other | Admitting: Physical Therapy

## 2015-01-25 ENCOUNTER — Emergency Department (HOSPITAL_COMMUNITY)
Admission: EM | Admit: 2015-01-25 | Discharge: 2015-01-25 | Disposition: A | Discharge status: Home / Self Care | Payer: Medicare Other | Attending: Emergency Medicine | Admitting: Emergency Medicine

## 2015-01-25 ENCOUNTER — Encounter (HOSPITAL_COMMUNITY): Payer: Self-pay | Admitting: Nurse Practitioner

## 2015-01-25 DIAGNOSIS — E785 Hyperlipidemia, unspecified: Secondary | ICD-10-CM | POA: Insufficient documentation

## 2015-01-25 DIAGNOSIS — M25561 Pain in right knee: Secondary | ICD-10-CM | POA: Diagnosis not present

## 2015-01-25 DIAGNOSIS — M1990 Unspecified osteoarthritis, unspecified site: Secondary | ICD-10-CM | POA: Diagnosis not present

## 2015-01-25 DIAGNOSIS — I10 Essential (primary) hypertension: Secondary | ICD-10-CM | POA: Diagnosis not present

## 2015-01-25 DIAGNOSIS — Z87891 Personal history of nicotine dependence: Secondary | ICD-10-CM | POA: Diagnosis not present

## 2015-01-25 DIAGNOSIS — Z9861 Coronary angioplasty status: Secondary | ICD-10-CM | POA: Diagnosis not present

## 2015-01-25 DIAGNOSIS — E039 Hypothyroidism, unspecified: Secondary | ICD-10-CM | POA: Diagnosis not present

## 2015-01-25 DIAGNOSIS — M545 Low back pain: Secondary | ICD-10-CM

## 2015-01-25 DIAGNOSIS — E559 Vitamin D deficiency, unspecified: Secondary | ICD-10-CM | POA: Insufficient documentation

## 2015-01-25 DIAGNOSIS — I2510 Atherosclerotic heart disease of native coronary artery without angina pectoris: Secondary | ICD-10-CM | POA: Diagnosis not present

## 2015-01-25 DIAGNOSIS — K219 Gastro-esophageal reflux disease without esophagitis: Secondary | ICD-10-CM | POA: Insufficient documentation

## 2015-01-25 DIAGNOSIS — M25461 Effusion, right knee: Secondary | ICD-10-CM | POA: Diagnosis not present

## 2015-01-25 DIAGNOSIS — Z7982 Long term (current) use of aspirin: Secondary | ICD-10-CM | POA: Insufficient documentation

## 2015-01-25 DIAGNOSIS — Z8709 Personal history of other diseases of the respiratory system: Secondary | ICD-10-CM | POA: Diagnosis not present

## 2015-01-25 DIAGNOSIS — M5440 Lumbago with sciatica, unspecified side: Secondary | ICD-10-CM | POA: Diagnosis not present

## 2015-01-25 DIAGNOSIS — Z8719 Personal history of other diseases of the digestive system: Secondary | ICD-10-CM | POA: Insufficient documentation

## 2015-01-25 DIAGNOSIS — Z79899 Other long term (current) drug therapy: Secondary | ICD-10-CM | POA: Insufficient documentation

## 2015-01-25 DIAGNOSIS — D519 Vitamin B12 deficiency anemia, unspecified: Secondary | ICD-10-CM | POA: Diagnosis not present

## 2015-01-25 DIAGNOSIS — G8929 Other chronic pain: Secondary | ICD-10-CM | POA: Insufficient documentation

## 2015-01-25 MED ORDER — KETOROLAC TROMETHAMINE 30 MG/ML IJ SOLN
30.0000 mg | Freq: Once | INTRAMUSCULAR | Status: AC
  Administered 2015-01-25: 30 mg via INTRAMUSCULAR
  Filled 2015-01-25: qty 1

## 2015-01-25 MED ORDER — HYDROMORPHONE HCL 1 MG/ML IJ SOLN
1.0000 mg | Freq: Once | INTRAMUSCULAR | Status: AC
  Administered 2015-01-25: 1 mg via INTRAMUSCULAR
  Filled 2015-01-25: qty 1

## 2015-01-25 NOTE — ED Notes (Signed)
Pt is reporting righting right knee pain which she says she has had 11 surgeries in the past, today while in church she states she had a sudden onset of sharp lumber area back pain that radiates to the right knee. Rates overall pain 10/10.

## 2015-01-25 NOTE — ED Provider Notes (Signed)
CSN: [id number]     Arrival date & time 01/25/15  1607 History   First MD Initiated Contact with Patient 01/25/15 1628     Chief Complaint  Patient presents with  . Knee Pain  . Back Pain    HPI   41 YOF presents to the ED with complaints of right lower back pain with radiation down into her right posterior leg to her knee. She reports a sig past medical history including neck pain, back pain, and knee pain. She has been seen by Dr. Ethelene Hal who recommend surgery on her back; unable to recall specific indication or pathology. She notes that due to her knee pain and surgeries, she is unable to have her back fixed. Patient reports that she recently started rehabilitation program for her knee, and thinks this may be contributing to the back pain. Patient reports that she's had a similar flare approximately 2 weeks ago. Patient denies abdominal pain, chest pain, back pain red flags. Patient reports using Flexeril, oxycodone with minimal relief of back symptoms.  Past Medical History  Diagnosis Date  . Hyperlipidemia   . Status post primary angioplasty with coronary stent     DRUG-ELUTING STENT X1  TO PROXIMAL LAD  . Chronic back pain   . Chronic neck pain   . Arthritis     HANDS, KNEES  . DDD (degenerative disc disease), cervical   . DDD (degenerative disc disease), lumbar   . Radicular pain in left arm OCCASIONAL PAIN SHOOTING DOWN LEFT ARM SECAONDARY TO  CERVICAL DEGENERATION  . Hemorrhoid   . Diverticulitis of colon   . B12 deficiency anemia   . Vitamin D deficiency   . Hypothyroidism   . Synovitis of knee HYPERTROPHIC RIGHT KNEE  . Abnormal stress test 01-22-2009    LOW RISK ADENOSINE NUCLEAR STUDY W/ PROBABLE MILD APICAL THINNING BUT NO ISCHEMIA  . Depression     states well controlled  . GERD (gastroesophageal reflux disease)   . Inguinal hernia, right     with ventral hernia  . Hypertension       PCP Dr Lysbeth Galas    . Hemorrhoids   . Coronary artery disease 2005    STRESS TEST /,  NOte Dr Kirtland Bouchard EPIC/ no cardiologist now--STATES HEART STENT PLACED 2010  . Sleep apnea     couldnt tolerate machine- states last sleep study " many years ago"  . Bronchitis     hx of   . H/O hiatal hernia    Past Surgical History  Procedure Laterality Date  . Knee closed reduction  05-07-2009  RIGHT KNEE    LEFT KNEE  10-01-2009  . Total knee arthroplasty  LEFT  2005    RIGHT  03-13-2009  . Total knee revision  RIGHT 09-30-2009    LEFT 11-25-2009  . Hysteroscopy w/d&c  2002  . Open patellofemoral right knee/ lateral release  05-04-2009  . Knee arthroscopy w/ debridement  01-22-2010    SEPTIC KNEE  . Right knee i & d polythylene revision  02-04-2010    KNEE INFECTED  . Left foot surg.  1990  . Appendectomy  1993  . Knee arthroscopy  LEFT 1999    RIGHT 2004  . Hemicolectomy for diverticulitis  2005  . Knee arthroscopy  08/03/2011    Procedure: ARTHROSCOPY KNEE;  Surgeon: Loanne Drilling;  Location: [facility];  Service: Orthopedics;  Laterality: Right;  . Synovectomy  08/03/2011    Procedure: SYNOVECTOMY;  Surgeon: Gus Rankin  Aluisio;  Location: [facility];  Service: Orthopedics;  Laterality: Right;  . I&d knee with poly exchange  02/01/2012    Procedure: IRRIGATION AND DEBRIDEMENT KNEE WITH POLY EXCHANGE;  Surgeon: Loanne Drilling, MD;  Location: [facility];  Service: Orthopedics;  Laterality: Right;  Right Knee Polyethlene Revision   . Colon surgery    . Total knee revision Right 02/20/2013    Procedure: RIGHT TOTAL KNEE ARTHROPLASTY REVISION VERSUS RESECTION ARTHROPLASTY;  Surgeon: Loanne Drilling, MD;  Location: [facility];  Service: Orthopedics;  Laterality: Right;  . Joint replacement      right knee   . Coronary angioplasty  2004    DRUG-ELUTING STENT X1 TO PROXIMAL  LAD  . Cataract extraction w/phaco Right 05/06/2014    Procedure: CATARACT EXTRACTION PHACO AND INTRAOCULAR LENS PLACEMENT (IOC);  Surgeon: Loraine Leriche T. Nile Riggs, MD;  Location: [facility];  Service:  Ophthalmology;  Laterality: Right;  CDE 6.41  . Total knee revision Right 04/02/2014    Procedure: RIGHT KNEE FEMORAL ARTHROPLASTY REVISION;  Surgeon: Loanne Drilling, MD;  Location: [facility];  Service: Orthopedics;  Laterality: Right;  . Cataract extraction w/phaco Left 05/20/2014    Procedure: CATARACT EXTRACTION PHACO AND INTRAOCULAR LENS PLACEMENT (IOC);  Surgeon: Loraine Leriche T. Nile Riggs, MD;  Location: [facility];  Service: Ophthalmology;  Laterality: Left;  CDE:4.42  . Laparoscopic gastric sleeve resection N/A 07/21/2014    Procedure: LAPAROSCOPIC GASTRIC SLEEVE RESECTION, TAKEDOWN OF INCARCERATED VENTRAL HERNIA, ENTERAL LYSIS, UPPER ENDOSCOPY;  Surgeon: Valarie Merino, MD;  Location: [facility];  Service: General;  Laterality: N/A;  . Knee arthroscopy Right 12/17/2014    Procedure: RIGHT ARTHROSCOPY KNEE, SYNOVECTOMY;  Surgeon: Ollen Gross, MD;  Location: [facility];  Service: Orthopedics;  Laterality: Right;   Family History  Problem Relation Age of Onset  . COPD Mother   . Cancer Father     lung   History  Substance Use Topics  . Smoking status: Former Smoker -- 39 years    Types: Cigarettes    Quit date: 07/31/2002  . Smokeless tobacco: Never Used  . Alcohol Use: No   OB History    No data available     Review of Systems  All other systems reviewed and are negative.   Allergies  Review of patient's allergies indicates no known allergies.  Home Medications   Prior to Admission medications   Medication Sig Start Date End Date Taking? Authorizing Provider  aspirin EC 81 MG tablet Take 81 mg by mouth at bedtime.    Yes Historical Provider, MD  calcium citrate-vitamin D (CITRACAL+D) 315-200 MG-UNIT per tablet Take 1-2 tablets by mouth daily.    Yes Historical Provider, MD  cyanocobalamin (,VITAMIN B-12,) 1000 MCG/ML injection Inject 1,000 mcg into the skin every 30 (thirty) days. 04/01/14  Yes Historical Provider, MD  escitalopram (LEXAPRO) 10 MG tablet Take 10 mg by mouth at bedtime.   Yes  Historical Provider, MD  gabapentin (NEURONTIN) 100 MG capsule Take 200 mg by mouth 2 (two) times daily.   Yes Historical Provider, MD  HYDROcodone-acetaminophen (NORCO) 5-325 MG per tablet Take 1-2 tablets by mouth every 6 (six) hours as needed for moderate pain. 12/17/14  Yes Ollen Gross, MD  levothyroxine (SYNTHROID, LEVOTHROID) 125 MCG tablet Take 125 mcg by mouth daily before breakfast. Brand Name Only   Yes Historical Provider, MD  methocarbamol (ROBAXIN) 500 MG tablet Take 1 tablet (500 mg total) by mouth 4 (four) times daily. As needed for muscle  spasm 12/17/14  Yes Ollen GrossFrank Aluisio, MD  metoprolol succinate (TOPROL-XL) 25 MG 24 hr tablet Take 25 mg by mouth daily with lunch.    Yes Historical Provider, MD  oxyCODONE (OXY IR/ROXICODONE) 5 MG immediate release tablet Take 1-2 tablets (5-10 mg total) by mouth every 3 (three) hours as needed for moderate pain, severe pain or breakthrough pain. 04/04/14  Yes Avel Peacerew Perkins, PA-C  pantoprazole (PROTONIX) 40 MG tablet Take 40 mg by mouth daily with lunch.    Yes Historical Provider, MD  Pediatric Multiple Vit-C-FA (FLINSTONES GUMMIES OMEGA-3 DHA PO) Take 1 each by mouth 2 (two) times daily.   Yes Historical Provider, MD  simvastatin (ZOCOR) 40 MG tablet Take 40 mg by mouth daily.   Yes Historical Provider, MD  Vitamin D, Ergocalciferol, (DRISDOL) 50000 UNITS CAPS capsule Take 50,000 Units by mouth every 7 (seven) days. Fridays   Yes Historical Provider, MD  lisinopril (PRINIVIL,ZESTRIL) 10 MG tablet Take 10 mg by mouth daily with lunch.     Historical Provider, MD   BP 117/70 mmHg  Pulse 47  Temp(Src) 98 F (36.7 C) (Oral)  Resp 16  SpO2 100%   Physical Exam  Constitutional: She is oriented to person, place, and time. She appears well-developed and well-nourished.  HENT:  Head: Normocephalic and atraumatic.  Eyes: Conjunctivae are normal. Pupils are equal, round, and reactive to light. Right eye exhibits no discharge. Left eye exhibits no  discharge. No scleral icterus.  Neck: Normal range of motion. No JVD present. No tracheal deviation present.  Pulmonary/Chest: Effort normal. No stridor.  Musculoskeletal:  Tenderness to palpation of right lower back and gluteus. No obvious deformity or trauma. Reduced ROM of back hip and left knee due to pain.   Right knee swelling, and tenderness to palpation. No lower extremity swelling or edema or tenderness to palpation to calves.   Neurological: She is alert and oriented to person, place, and time. Coordination normal.  Skin: Skin is warm and dry.  Psychiatric: She has a normal mood and affect. Her behavior is normal. Judgment and thought content normal.  Nursing note and vitals reviewed.   ED Course  Procedures (including critical care time) Labs Review Labs Reviewed - No data to display  Imaging Review No results found.   EKG Interpretation None      MDM   Final diagnoses:  Right low back pain, with sciatica presence unspecified  Knee pain, right   Labs:  Imaging:  Consults:  Therapeutics: Toradol, Dilaudid   Assessment:  Plan: Patient presents with lower back pain with sciatica. This likely represents an acute flare due to recent rehabilitation exercises for her knee. Patient does have a history of back pain, has adequate pain medication and muscle relaxers at home. Patient does have swelling about the right knee that has been present since arthroscopic knee surgery approximately 6 weeks ago. No lower extremity swelling or edema to indicate DVT. Flanks for back pain. Patient instructed use ibuprofen, heat and ice, follow-up with orthopedist for further evaluation and management. Strict return precautions given.       Eyvonne MechanicJeffrey Jacquan Savas, PA-C 01/25/15 2141  Gwyneth SproutWhitney Plunkett, MD 01/25/15 2149

## 2015-01-27 ENCOUNTER — Ambulatory Visit: Payer: Medicare Other | Attending: Orthopedic Surgery | Admitting: *Deleted

## 2015-01-27 ENCOUNTER — Encounter: Payer: Self-pay | Admitting: *Deleted

## 2015-01-27 DIAGNOSIS — M25561 Pain in right knee: Secondary | ICD-10-CM | POA: Diagnosis not present

## 2015-01-27 DIAGNOSIS — M25661 Stiffness of right knee, not elsewhere classified: Secondary | ICD-10-CM | POA: Diagnosis present

## 2015-01-27 DIAGNOSIS — R531 Weakness: Secondary | ICD-10-CM | POA: Diagnosis present

## 2015-01-27 DIAGNOSIS — R269 Unspecified abnormalities of gait and mobility: Secondary | ICD-10-CM | POA: Insufficient documentation

## 2015-01-27 NOTE — Therapy (Signed)
[facility] [address] [place], [place], [address] Phone: [phone]   Fax:  [phone]  Physical Therapy Treatment  Patient Details  Name: Andrea Norton MRN: [id number] Date of Birth: May 13, 1946 Referring Provider:  Joette CatchingNyland, Leonard, MD  Encounter Date: 01/27/2015      PT End of Session - 01/27/15 1500    Visit Number 2   Number of Visits 8   Date for PT Re-Evaluation 02/18/15   PT Start Time 1430   PT Stop Time 1518   PT Time Calculation (min) 48 min      Past Medical History  Diagnosis Date  . Hyperlipidemia   . Status post primary angioplasty with coronary stent     DRUG-ELUTING STENT X1  TO PROXIMAL LAD  . Chronic back pain   . Chronic neck pain   . Arthritis     HANDS, KNEES  . DDD (degenerative disc disease), cervical   . DDD (degenerative disc disease), lumbar   . Radicular pain in left arm OCCASIONAL PAIN SHOOTING DOWN LEFT ARM SECAONDARY TO  CERVICAL DEGENERATION  . Hemorrhoid   . Diverticulitis of colon   . B12 deficiency anemia   . Vitamin D deficiency   . Hypothyroidism   . Synovitis of knee HYPERTROPHIC RIGHT KNEE  . Abnormal stress test 01-22-2009    LOW RISK ADENOSINE NUCLEAR STUDY W/ PROBABLE MILD APICAL THINNING BUT NO ISCHEMIA  . Depression     states well controlled  . GERD (gastroesophageal reflux disease)   . Inguinal hernia, right     with ventral hernia  . Hypertension       PCP Dr Lysbeth GalasNyland    . Hemorrhoids   . Coronary artery disease 2005    STRESS TEST /, NOte Dr Kirtland BouchardHocherin EPIC/ no cardiologist now--STATES HEART STENT PLACED 2010  . Sleep apnea     couldnt tolerate machine- states last sleep study " many years ago"  . Bronchitis     hx of   . H/O hiatal hernia     Past Surgical History  Procedure Laterality Date  . Knee closed reduction  05-07-2009  RIGHT KNEE    LEFT KNEE  10-01-2009  . Total knee arthroplasty  LEFT  2005    RIGHT  03-13-2009  . Total knee revision  RIGHT  09-30-2009    LEFT 11-25-2009  . Hysteroscopy w/d&c  2002  . Open patellofemoral right knee/ lateral release  05-04-2009  . Knee arthroscopy w/ debridement  01-22-2010    SEPTIC KNEE  . Right knee i & d polythylene revision  02-04-2010    KNEE INFECTED  . Left foot surg.  1990  . Appendectomy  1993  . Knee arthroscopy  LEFT 1999    RIGHT 2004  . Hemicolectomy for diverticulitis  2005  . Knee arthroscopy  08/03/2011    Procedure: ARTHROSCOPY KNEE;  Surgeon: Loanne DrillingFrank V Aluisio;  Location: [facility];  Service: Orthopedics;  Laterality: Right;  . Synovectomy  08/03/2011    Procedure: SYNOVECTOMY;  Surgeon: Loanne DrillingFrank V Aluisio;  Location:  SURGERY CENTER;  Service: Orthopedics;  Laterality: Right;  . I&d knee with poly exchange  02/01/2012    Procedure: IRRIGATION AND DEBRIDEMENT KNEE WITH POLY EXCHANGE;  Surgeon: Loanne DrillingFrank V Aluisio, MD;  Location: [facility];  Service: Orthopedics;  Laterality: Right;  Right Knee Polyethlene Revision   . Colon surgery    . Total knee revision Right 02/20/2013    Procedure: RIGHT TOTAL KNEE ARTHROPLASTY  REVISION VERSUS RESECTION ARTHROPLASTY;  Surgeon: Loanne Drilling, MD;  Location: [facility];  Service: Orthopedics;  Laterality: Right;  . Joint replacement      right knee   . Coronary angioplasty  2004    DRUG-ELUTING STENT X1 TO PROXIMAL  LAD  . Cataract extraction w/phaco Right 05/06/2014    Procedure: CATARACT EXTRACTION PHACO AND INTRAOCULAR LENS PLACEMENT (IOC);  Surgeon: Loraine Leriche T. Nile Riggs, MD;  Location: [facility];  Service: Ophthalmology;  Laterality: Right;  CDE 6.41  . Total knee revision Right 04/02/2014    Procedure: RIGHT KNEE FEMORAL ARTHROPLASTY REVISION;  Surgeon: Loanne Drilling, MD;  Location: [facility];  Service: Orthopedics;  Laterality: Right;  . Cataract extraction w/phaco Left 05/20/2014    Procedure: CATARACT EXTRACTION PHACO AND INTRAOCULAR LENS PLACEMENT (IOC);  Surgeon: Loraine Leriche T. Nile Riggs, MD;  Location: [facility];  Service: Ophthalmology;   Laterality: Left;  CDE:4.42  . Laparoscopic gastric sleeve resection N/A 07/21/2014    Procedure: LAPAROSCOPIC GASTRIC SLEEVE RESECTION, TAKEDOWN OF INCARCERATED VENTRAL HERNIA, ENTERAL LYSIS, UPPER ENDOSCOPY;  Surgeon: Valarie Merino, MD;  Location: [facility];  Service: General;  Laterality: N/A;  . Knee arthroscopy Right 12/17/2014    Procedure: RIGHT ARTHROSCOPY KNEE, SYNOVECTOMY;  Surgeon: Ollen Gross, MD;  Location: [facility];  Service: Orthopedics;  Laterality: Right;    There were no vitals filed for this visit.  Visit Diagnosis:  Pain in right knee  Stiffness of right knee      Subjective Assessment - 01/27/15 1435    Subjective Patient had right knee arthroscopy 12/08/14. Patient continues to have weakness and pain since the surgery. She also reports cramping from hip down entire leg x 2 since surgery. This occurs when she stands.  She cannot put any pressure through her foot when it happens. Presently amb with SPC in the community and sometimes at home.   Pertinent History bil TKR   Limitations Standing;Walking   How long can you stand comfortably? 15 min   How long can you walk comfortably? 0 min comfortably; tolerates 15 minutes    Patient Stated Goals to get stronger   Currently in Pain? Yes   Pain Score 7    Pain Location Knee   Pain Orientation Right   Aggravating Factors  transitional move ments   Pain Relieving Factors ice                         [facility] Adult PT Treatment/Exercise - 01/27/15 0001    Knee/Hip Exercises: Aerobic   Nustep L5 x 15 min for ROM progression/RT LE endurance   Knee/Hip Exercises: Standing   Rocker Board 3 minutes   Knee/Hip Exercises: Supine   Quad Sets AROM;3 sets;10 reps   Short Arc Quad Sets AROM  SAQs with VMS to [employer]   Modalities   Modalities Insurance account manager Location RT VMO   Electrical Stimulation Action VMS x 10 mins 10 sec on / off with Administrator, Civil Service Goals Tone   Manual Therapy   Manual Therapy Soft tissue mobilization;Passive ROM   Soft tissue mobilization STW to incision and all around knee cap. PROM for knee Extension. AAROM for SLR 3x10   Passive ROM PROM for knee ext                  PT Short Term Goals - 01/21/15 1307    PT SHORT TERM GOAL #1  Title I with initial HEP   Time 2   Period Weeks   Status New           PT Long Term Goals - 01/21/15 1308    PT LONG TERM GOAL #1   Title I with advanced HEP   Time 6   Period Weeks   Status New   PT LONG TERM GOAL #2   Title improved right knee flex to 115 to improve gait   Time 6   Period Weeks   Status New   PT LONG TERM GOAL #3   Title decreased pain in RLE by 50%   Time 6   Period Weeks   Status New   PT LONG TERM GOAL #4   Title demo improved RLE strength to 4+/5 or better to improve function.   Time 6   Period Weeks   Status New   PT LONG TERM GOAL #5   Title improve FOTO to CK   Time 6   Period Weeks   Status New               Plan - 01/27/15 1458    Clinical Impression Statement Pt did fairly well with Rx today and had better quad activation. She is using a SPC to ambulate with, but has a flexed posture for ambulation. She was in the ED this weekend due ti severe LBP and sciatica, so her Rxs may have to be modified to prevent LBP. Goals are ongoing   Pt will benefit from skilled therapeutic intervention in order to improve on the following deficits Abnormal gait;Difficulty walking;Decreased range of motion;Obesity;Decreased activity tolerance;Pain;Decreased mobility;Decreased strength   Rehab Potential Good   PT Frequency 2x / week   PT Duration 6 weeks   PT Treatment/Interventions Cryotherapy;Electrical Stimulation;ADLs/Self Care Home Management;Ultrasound;Gait training;Therapeutic exercise;Balance training;Neuromuscular re-education;Patient/family education;Passive range of motion;Manual techniques;Vasopneumatic Device    PT Next Visit Plan review HEP; add gastroc stretch, weight shifting, calf raises, LE strengthening, Manual (ITB prn)   Consulted and Agree with Plan of Care Patient        Problem List Patient Active Problem List   Diagnosis Date Noted  . 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 Postoperative anemia due to acute blood loss 02/21/2013  . 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 DEGENERATIVE JOINT DISEASE 01/09/2009  . HYPOTHYROIDISM 01/06/2009  . DYSLIPIDEMIA 01/06/2009  . OBESITY, MORBID 01/06/2009  . HYPERTENSION 01/06/2009  . CAD 01/06/2009  . GASTROESOPHAGEAL REFLUX DISEASE 01/06/2009  . SLEEP APNEA 01/06/2009  . DYSPNEA 01/06/2009  . DIVERTICULITIS, HX OF 01/06/2009    Naphtali Riede,CHRIS, PTA 01/27/2015, 6:34 PM  [facility] [address] [place], [place], [address] Phone: [phone]   Fax:  [phone]

## 2015-01-29 ENCOUNTER — Ambulatory Visit: Payer: Medicare Other | Admitting: Physical Therapy

## 2015-01-29 ENCOUNTER — Encounter: Payer: Self-pay | Admitting: Physical Therapy

## 2015-01-29 DIAGNOSIS — M25561 Pain in right knee: Secondary | ICD-10-CM

## 2015-01-29 DIAGNOSIS — R269 Unspecified abnormalities of gait and mobility: Secondary | ICD-10-CM

## 2015-01-29 DIAGNOSIS — M25661 Stiffness of right knee, not elsewhere classified: Secondary | ICD-10-CM

## 2015-01-29 DIAGNOSIS — R531 Weakness: Secondary | ICD-10-CM

## 2015-01-29 NOTE — Therapy (Signed)
[facility] [address] [place], [place], [address] Phone: [phone]   Fax:  [phone]  Physical Therapy Treatment  Patient Details  Name: Andrea Norton MRN: [id number] Date of Birth: 05-Apr-1946 Referring Provider:  Joette Catching, MD  Encounter Date: 01/29/2015      PT End of Session - 01/29/15 1435    Visit Number 3   Number of Visits 8   Date for PT Re-Evaluation 02/18/15   PT Start Time 1434   PT Stop Time 1515   PT Time Calculation (min) 41 min   Equipment Utilized During Treatment --  [facility]   Activity Tolerance Patient limited by pain   Behavior During Therapy [facility] for tasks assessed/performed      Past Medical History  Diagnosis Date  . Hyperlipidemia   . Status post primary angioplasty with coronary stent     DRUG-ELUTING STENT X1  TO PROXIMAL LAD  . Chronic back pain   . Chronic neck pain   . Arthritis     HANDS, KNEES  . DDD (degenerative disc disease), cervical   . DDD (degenerative disc disease), lumbar   . Radicular pain in left arm OCCASIONAL PAIN SHOOTING DOWN LEFT ARM SECAONDARY TO  CERVICAL DEGENERATION  . Hemorrhoid   . Diverticulitis of colon   . B12 deficiency anemia   . Vitamin D deficiency   . Hypothyroidism   . Synovitis of knee HYPERTROPHIC RIGHT KNEE  . Abnormal stress test 01-22-2009    LOW RISK ADENOSINE NUCLEAR STUDY W/ PROBABLE MILD APICAL THINNING BUT NO ISCHEMIA  . Depression     states well controlled  . GERD (gastroesophageal reflux disease)   . Inguinal hernia, right     with ventral hernia  . Hypertension       PCP Dr Lysbeth Galas    . Hemorrhoids   . Coronary artery disease 2005    STRESS TEST /, NOte Dr Kirtland Bouchard EPIC/ no cardiologist now--STATES HEART STENT PLACED 2010  . Sleep apnea     couldnt tolerate machine- states last sleep study " many years ago"  . Bronchitis     hx of   . H/O hiatal hernia     Past Surgical History  Procedure Laterality Date  . Knee closed  reduction  05-07-2009  RIGHT KNEE    LEFT KNEE  10-01-2009  . Total knee arthroplasty  LEFT  2005    RIGHT  03-13-2009  . Total knee revision  RIGHT 09-30-2009    LEFT 11-25-2009  . Hysteroscopy w/d&c  2002  . Open patellofemoral right knee/ lateral release  05-04-2009  . Knee arthroscopy w/ debridement  01-22-2010    SEPTIC KNEE  . Right knee i & d polythylene revision  02-04-2010    KNEE INFECTED  . Left foot surg.  1990  . Appendectomy  1993  . Knee arthroscopy  LEFT 1999    RIGHT 2004  . Hemicolectomy for diverticulitis  2005  . Knee arthroscopy  08/03/2011    Procedure: ARTHROSCOPY KNEE;  Surgeon: Loanne Drilling;  Location: [facility];  Service: Orthopedics;  Laterality: Right;  . Synovectomy  08/03/2011    Procedure: SYNOVECTOMY;  Surgeon: Loanne Drilling;  Location: [facility];  Service: Orthopedics;  Laterality: Right;  . I&d knee with poly exchange  02/01/2012    Procedure: IRRIGATION AND DEBRIDEMENT KNEE WITH POLY EXCHANGE;  Surgeon: Loanne Drilling, MD;  Location: [facility];  Service: Orthopedics;  Laterality: Right;  Right Knee Polyethlene Revision   . Colon surgery    . Total knee revision Right 02/20/2013    Procedure: RIGHT TOTAL KNEE ARTHROPLASTY REVISION VERSUS RESECTION ARTHROPLASTY;  Surgeon: Loanne Drilling, MD;  Location: [facility];  Service: Orthopedics;  Laterality: Right;  . Joint replacement      right knee   . Coronary angioplasty  2004    DRUG-ELUTING STENT X1 TO PROXIMAL  LAD  . Cataract extraction w/phaco Right 05/06/2014    Procedure: CATARACT EXTRACTION PHACO AND INTRAOCULAR LENS PLACEMENT (IOC);  Surgeon: Loraine Leriche T. Nile Riggs, MD;  Location: [facility];  Service: Ophthalmology;  Laterality: Right;  CDE 6.41  . Total knee revision Right 04/02/2014    Procedure: RIGHT KNEE FEMORAL ARTHROPLASTY REVISION;  Surgeon: Loanne Drilling, MD;  Location: [facility];  Service: Orthopedics;  Laterality: Right;  . Cataract extraction w/phaco Left 05/20/2014     Procedure: CATARACT EXTRACTION PHACO AND INTRAOCULAR LENS PLACEMENT (IOC);  Surgeon: Loraine Leriche T. Nile Riggs, MD;  Location: [facility];  Service: Ophthalmology;  Laterality: Left;  CDE:4.42  . Laparoscopic gastric sleeve resection N/A 07/21/2014    Procedure: LAPAROSCOPIC GASTRIC SLEEVE RESECTION, TAKEDOWN OF INCARCERATED VENTRAL HERNIA, ENTERAL LYSIS, UPPER ENDOSCOPY;  Surgeon: Valarie Merino, MD;  Location: [facility];  Service: General;  Laterality: N/A;  . Knee arthroscopy Right 12/17/2014    Procedure: RIGHT ARTHROSCOPY KNEE, SYNOVECTOMY;  Surgeon: Ollen Gross, MD;  Location: [facility];  Service: Orthopedics;  Laterality: Right;    There were no vitals filed for this visit.  Visit Diagnosis:  Pain in right knee  Stiffness of right knee  Abnormality of gait  Generalized weakness      Subjective Assessment - 01/29/15 1434    Subjective Reports increased pain today in R knee.   Pertinent History bil TKR   Limitations Standing;Walking   How long can you stand comfortably? 15 min   How long can you walk comfortably? 0 min comfortably; tolerates 15 minutes    Patient Stated Goals to get stronger   Currently in Pain? Yes   Pain Score 6    Pain Location Knee   Pain Orientation Right   Pain Descriptors / Indicators Jabbing   Pain Type Acute pain   Pain Onset More than a month ago            [facility] PT Assessment - 01/29/15 0001    Assessment   Medical Diagnosis s/p Rt knee arthroscopy   Onset Date/Surgical Date 12/17/14   Next MD Visit 02/24/15   Prior Therapy none   ROM / Strength   AROM / PROM / Strength AROM   AROM   Overall AROM  Deficits   AROM Assessment Site Knee   Right/Left Knee Right   Right Knee Extension 2   Right Knee Flexion 114                     [facility] Adult PT Treatment/Exercise - 01/29/15 0001    Knee/Hip Exercises: Stretches   Gastroc Stretch 3 reps;30 seconds;Other (comment)  Rockerboard as slant board   Knee/Hip Exercises: Aerobic   Nustep L4  x10 min   Knee/Hip Exercises: Standing   Heel Raises Both;2 sets;10 reps   Forward Step Up Right;1 set;10 reps;Hand Hold: 2;Step Height: 6"   Rocker Board 3 minutes   Other Standing Knee Exercises B weightshift on airex x2 min   Knee/Hip Exercises: Seated   Long Arc Quad AROM;Right;2 sets;10 reps   Marching Limitations Marching  with 2# on R ankle x30 reps   Knee/Hip Exercises: Supine   Short Arc Quad Sets Strengthening;Right;Other (comment)  with VMS and SAQ for R quad activation; Attempted    Modalities   Modalities Quarry managerlectrical Stimulation   Electrical Stimulation   Electrical Stimulation Location R VMO/ Careers information officerQuad   Electrical Stimulation Action VMS   Electrical Stimulation Parameters 10/10 only able to complete for 3 min   Electrical Stimulation Goals Tone   Manual Therapy   Manual Therapy Soft tissue mobilization;Passive ROM   Soft tissue mobilization R patellar mobilizations L/R, sup/inf, tilts to normalize patellar mobility   Passive ROM PROM of R knee into flex/ext with gentle holds at end range                  PT Short Term Goals - 01/29/15 1516    PT SHORT TERM GOAL #1   Title I with initial HEP   Time 2   Period Weeks   Status Achieved           PT Long Term Goals - 01/29/15 1517    PT LONG TERM GOAL #1   Title I with advanced HEP   Time 6   Period Weeks   Status On-going   PT LONG TERM GOAL #2   Title improved right knee flex to 115 to improve gait   Time 6   Period Weeks   Status On-going   PT LONG TERM GOAL #3   Title decreased pain in RLE by 50%   Time 6   Period Weeks   Status On-going   PT LONG TERM GOAL #4   Title demo improved RLE strength to 4+/5 or better to improve function.   Time 6   Period Weeks   Status On-going   PT LONG TERM GOAL #5   Title improve FOTO to CK   Time 6   Period Weeks   Status On-going               Plan - 01/29/15 1508    Clinical Impression Statement Patient did fairly well with treatment today  but was limited due to increased pain. Had complaints of a burning sensation in the lower R leg towards ankle. Continues to demonstrate R quad weakness with resistance or gravity. Unable to complete VMS due to increased lumbar pain. AROM of the R knee was measured at 2-114 deg. Continues with ambulate with antalgic gait with SPC. Experienced 7-8/10 pain following treatment.   Pt will benefit from skilled therapeutic intervention in order to improve on the following deficits Abnormal gait;Difficulty walking;Decreased range of motion;Obesity;Decreased activity tolerance;Pain;Decreased mobility;Decreased strength   Rehab Potential Good   PT Frequency 2x / week   PT Duration 6 weeks   PT Treatment/Interventions Cryotherapy;Electrical Stimulation;ADLs/Self Care Home Management;Ultrasound;Gait training;Therapeutic exercise;Balance training;Neuromuscular re-education;Patient/family education;Passive range of motion;Manual techniques;Vasopneumatic Device   PT Next Visit Plan Continue per PT POC. Continue quad strengthening but try to eliminate lumbar pain with position   Consulted and Agree with Plan of Care Patient        Problem List Patient Active Problem List   Diagnosis Date Noted  . 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 Postoperative anemia due to acute blood loss 02/21/2013  . 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 DEGENERATIVE JOINT DISEASE 01/09/2009  . HYPOTHYROIDISM 01/06/2009  . DYSLIPIDEMIA 01/06/2009  . OBESITY, MORBID  01/06/2009  . HYPERTENSION 01/06/2009  . CAD 01/06/2009  . GASTROESOPHAGEAL REFLUX DISEASE 01/06/2009  . SLEEP APNEA 01/06/2009  . DYSPNEA 01/06/2009  . DIVERTICULITIS, HX OF 01/06/2009    Florence Canner, PTA 01/29/2015 3:24 PM  [facility] [address] [place], [place],  [address] Phone: [phone]   Fax:  [phone]

## 2015-02-02 ENCOUNTER — Encounter: Payer: Medicare Other | Admitting: Physical Therapy

## 2015-02-04 ENCOUNTER — Encounter: Payer: Medicare Other | Admitting: Physical Therapy

## 2015-02-04 ENCOUNTER — Encounter: Payer: Medicare Other | Attending: Family Medicine | Admitting: Dietician

## 2015-02-04 DIAGNOSIS — Z9884 Bariatric surgery status: Secondary | ICD-10-CM | POA: Insufficient documentation

## 2015-02-04 DIAGNOSIS — E119 Type 2 diabetes mellitus without complications: Secondary | ICD-10-CM | POA: Insufficient documentation

## 2015-02-04 DIAGNOSIS — Z6841 Body Mass Index (BMI) 40.0 and over, adult: Secondary | ICD-10-CM | POA: Insufficient documentation

## 2015-02-04 DIAGNOSIS — Z713 Dietary counseling and surveillance: Secondary | ICD-10-CM | POA: Insufficient documentation

## 2015-02-04 NOTE — Progress Notes (Signed)
  Follow-up visit:  7 months Post-Operative Gastric Sleeve Surgery  Medical Nutrition Therapy:  Appt start time: 320 end time: 345   Primary concerns today: Post-operative Bariatric Surgery Nutrition Management. Malachi BondsGloria returns having lost another 16 lbs. She reports eating small portions several (4-5) times a day and meeting her protein needs. Her knee is still causing her pain and she is not able to exercise. She states that she is bored with her food options.  Surgery date: 07/21/14 Surgery type: Gastric sleeve [facility] 288 lbs on 12/28/13 Weight today: 238 lbs Weight change: 16 lbs Total weight lost: 50 lbs  TANITA  BODY COMP RESULTS  07/07/14 08/05/14 09/15/14 10/23/14 02/04/15   BMI (kg/m^2) 51.5 48.7 45.9 43.6 40.9   Fat Mass (lbs) 156.5 151 139.5 130.5 112.5   Fat Free Mass (lbs) 143.5 132.5 128 123.5 125.5   Total Body Water (lbs) 105 97.0 93.5 90.5 92    Preferred Learning Style  No preference indicated   Learning Readiness:   Ready  24-hr recall: B (AM): 1 boiled egg or scrambled egg with cheese and 2 strips of [place] bacon or bologna (14g) Snk (AM):  Sometimes ham and cheese rollup (0-14g) L (PM): 3 oz baked chicken (21g)  Snk (PM): sometimes  D (PM): chili or chicken and vegetables (21g) Snk (PM): jello and sugar free popsicles or yogurt or cheezits  Fluid intake: flavored water, unsweetened tea with Splenda, decaf coffee (still not getting 64 oz but improving) Estimated total protein intake: 60+g  Medications: no longer on Metformin or diuretic; on 1/2 of statin Supplementation: taking  Using straws: no Drinking while eating: no, sometimes takes a sip of coffee with breakfast Hair loss: yes, thinning on top Carbonated beverages: no N/V/D/C: constipation resolving Dumping syndrome: none  Recent physical activity:  none  Progress Towards Goal(s):  In progress.   Nutritional Diagnosis:  [place]-3.3 Overweight/obesity related to past poor dietary  habits and physical inactivity as evidenced by patient w/ recent gastric sleeve surgery following dietary guidelines for continued weight loss.     Intervention:  Nutrition counseling provided.  Teaching Method Utilized:  Visual Auditory Hands on  Barriers to learning/adherence to lifestyle change: knee pain  Demonstrated degree of understanding via:  Teach Back   Monitoring/Evaluation:  Dietary intake, exercise, and body weight. Follow up in 3 months for 10 month post-op visit.

## 2015-02-04 NOTE — Patient Instructions (Addendum)
Goals:  Follow Phase 3B: High Protein + Non-Starchy Vegetables  Eat 3-6 small meals/snacks, every 3-5 hrs  Increase lean protein foods to meet 60g goal  Increase fluid intake to 64oz +  Avoid drinking 15 minutes before, during and 30 minutes after eating  Aim for >30 min of physical activity daily  Continue to eat several times a day  Limit carbs like crackers and sweets  Keep carbs to 15 grams per meal  Try 100-calorie bag of popcorn   TANITA  BODY COMP RESULTS  07/07/14 08/05/14 09/15/14 10/23/14 02/04/15   BMI (kg/m^2) 51.5 48.7 45.9 43.6 40.9   Fat Mass (lbs) 156.5 151 139.5 130.5 112.5   Fat Free Mass (lbs) 143.5 132.5 128 123.5 125.5   Total Body Water (lbs) 105 97.0 93.5 90.5 92

## 2015-02-05 ENCOUNTER — Encounter: Payer: Self-pay | Admitting: Dietician

## 2015-02-05 ENCOUNTER — Encounter: Payer: Medicare Other | Admitting: Physical Therapy

## 2015-03-07 ENCOUNTER — Emergency Department (HOSPITAL_COMMUNITY): Payer: Medicare Other

## 2015-03-07 ENCOUNTER — Encounter (HOSPITAL_COMMUNITY): Payer: Self-pay | Admitting: Emergency Medicine

## 2015-03-07 ENCOUNTER — Inpatient Hospital Stay (HOSPITAL_COMMUNITY)
Admission: EM | Admit: 2015-03-07 | Discharge: 2015-03-12 | DRG: 464 | Disposition: A | Discharge status: Home Health | Payer: Medicare Other | Attending: Internal Medicine | Admitting: Internal Medicine

## 2015-03-07 DIAGNOSIS — M25561 Pain in right knee: Secondary | ICD-10-CM | POA: Diagnosis present

## 2015-03-07 DIAGNOSIS — Y831 Surgical operation with implant of artificial internal device as the cause of abnormal reaction of the patient, or of later complication, without mention of misadventure at the time of the procedure: Secondary | ICD-10-CM | POA: Diagnosis present

## 2015-03-07 DIAGNOSIS — T8453XA Infection and inflammatory reaction due to internal right knee prosthesis, initial encounter: Principal | ICD-10-CM | POA: Diagnosis present

## 2015-03-07 DIAGNOSIS — L089 Local infection of the skin and subcutaneous tissue, unspecified: Secondary | ICD-10-CM

## 2015-03-07 DIAGNOSIS — E785 Hyperlipidemia, unspecified: Secondary | ICD-10-CM | POA: Diagnosis present

## 2015-03-07 DIAGNOSIS — L03115 Cellulitis of right lower limb: Secondary | ICD-10-CM | POA: Diagnosis present

## 2015-03-07 DIAGNOSIS — K219 Gastro-esophageal reflux disease without esophagitis: Secondary | ICD-10-CM | POA: Diagnosis present

## 2015-03-07 DIAGNOSIS — R197 Diarrhea, unspecified: Secondary | ICD-10-CM | POA: Diagnosis present

## 2015-03-07 DIAGNOSIS — Z825 Family history of asthma and other chronic lower respiratory diseases: Secondary | ICD-10-CM

## 2015-03-07 DIAGNOSIS — Z801 Family history of malignant neoplasm of trachea, bronchus and lung: Secondary | ICD-10-CM

## 2015-03-07 DIAGNOSIS — M1990 Unspecified osteoarthritis, unspecified site: Secondary | ICD-10-CM | POA: Diagnosis present

## 2015-03-07 DIAGNOSIS — Z7982 Long term (current) use of aspirin: Secondary | ICD-10-CM | POA: Diagnosis not present

## 2015-03-07 DIAGNOSIS — B965 Pseudomonas (aeruginosa) (mallei) (pseudomallei) as the cause of diseases classified elsewhere: Secondary | ICD-10-CM | POA: Diagnosis present

## 2015-03-07 DIAGNOSIS — Z79899 Other long term (current) drug therapy: Secondary | ICD-10-CM | POA: Diagnosis not present

## 2015-03-07 DIAGNOSIS — Z955 Presence of coronary angioplasty implant and graft: Secondary | ICD-10-CM

## 2015-03-07 DIAGNOSIS — G8929 Other chronic pain: Secondary | ICD-10-CM | POA: Diagnosis present

## 2015-03-07 DIAGNOSIS — I2510 Atherosclerotic heart disease of native coronary artery without angina pectoris: Secondary | ICD-10-CM | POA: Diagnosis present

## 2015-03-07 DIAGNOSIS — Z79891 Long term (current) use of opiate analgesic: Secondary | ICD-10-CM | POA: Diagnosis not present

## 2015-03-07 DIAGNOSIS — I10 Essential (primary) hypertension: Secondary | ICD-10-CM | POA: Diagnosis present

## 2015-03-07 DIAGNOSIS — M009 Pyogenic arthritis, unspecified: Secondary | ICD-10-CM | POA: Diagnosis present

## 2015-03-07 DIAGNOSIS — Z87891 Personal history of nicotine dependence: Secondary | ICD-10-CM

## 2015-03-07 DIAGNOSIS — I959 Hypotension, unspecified: Secondary | ICD-10-CM | POA: Diagnosis present

## 2015-03-07 DIAGNOSIS — E039 Hypothyroidism, unspecified: Secondary | ICD-10-CM | POA: Diagnosis present

## 2015-03-07 DIAGNOSIS — F329 Major depressive disorder, single episode, unspecified: Secondary | ICD-10-CM | POA: Diagnosis present

## 2015-03-07 DIAGNOSIS — M2540 Effusion, unspecified joint: Secondary | ICD-10-CM | POA: Diagnosis present

## 2015-03-07 DIAGNOSIS — M549 Dorsalgia, unspecified: Secondary | ICD-10-CM | POA: Diagnosis present

## 2015-03-07 DIAGNOSIS — M542 Cervicalgia: Secondary | ICD-10-CM | POA: Diagnosis present

## 2015-03-07 DIAGNOSIS — R509 Fever, unspecified: Secondary | ICD-10-CM | POA: Diagnosis not present

## 2015-03-07 DIAGNOSIS — Z96652 Presence of left artificial knee joint: Secondary | ICD-10-CM | POA: Diagnosis present

## 2015-03-07 DIAGNOSIS — L910 Hypertrophic scar: Secondary | ICD-10-CM | POA: Diagnosis present

## 2015-03-07 DIAGNOSIS — Z6841 Body Mass Index (BMI) 40.0 and over, adult: Secondary | ICD-10-CM

## 2015-03-07 LAB — COMPREHENSIVE METABOLIC PANEL
ALT: 16 U/L (ref 14–54)
AST: 21 U/L (ref 15–41)
Albumin: 3.6 g/dL (ref 3.5–5.0)
Alkaline Phosphatase: 76 U/L (ref 38–126)
Anion gap: 9 (ref 5–15)
BUN: 14 mg/dL (ref 6–20)
CO2: 25 mmol/L (ref 22–32)
CREATININE: 0.7 mg/dL (ref 0.44–1.00)
Calcium: 9.2 mg/dL (ref 8.9–10.3)
Chloride: 104 mmol/L (ref 101–111)
GLUCOSE: 109 mg/dL — AB (ref 65–99)
POTASSIUM: 4.3 mmol/L (ref 3.5–5.1)
Sodium: 138 mmol/L (ref 135–145)
Total Bilirubin: 1 mg/dL (ref 0.3–1.2)
Total Protein: 7.8 g/dL (ref 6.5–8.1)

## 2015-03-07 LAB — CBC WITH DIFFERENTIAL/PLATELET
Basophils Absolute: 0 10*3/uL (ref 0.0–0.1)
Basophils Relative: 0 % (ref 0–1)
EOS ABS: 0.2 10*3/uL (ref 0.0–0.7)
EOS PCT: 1 % (ref 0–5)
HCT: 41.6 % (ref 36.0–46.0)
HEMOGLOBIN: 13.6 g/dL (ref 12.0–15.0)
LYMPHS ABS: 1.3 10*3/uL (ref 0.7–4.0)
LYMPHS PCT: 9 % — AB (ref 12–46)
MCH: 29.4 pg (ref 26.0–34.0)
MCHC: 32.7 g/dL (ref 30.0–36.0)
MCV: 90 fL (ref 78.0–100.0)
MONO ABS: 1.2 10*3/uL — AB (ref 0.1–1.0)
MONOS PCT: 7 % (ref 3–12)
NEUTROS ABS: 13.1 10*3/uL — AB (ref 1.7–7.7)
NEUTROS PCT: 83 % — AB (ref 43–77)
Platelets: 282 10*3/uL (ref 150–400)
RBC: 4.62 MIL/uL (ref 3.87–5.11)
RDW: 13.8 % (ref 11.5–15.5)
WBC: 15.8 10*3/uL — ABNORMAL HIGH (ref 4.0–10.5)

## 2015-03-07 LAB — I-STAT CG4 LACTIC ACID, ED: LACTIC ACID, VENOUS: 1.56 mmol/L (ref 0.5–2.0)

## 2015-03-07 LAB — SEDIMENTATION RATE: Sed Rate: 41 mm/hr — ABNORMAL HIGH (ref 0–22)

## 2015-03-07 MED ORDER — MORPHINE SULFATE 4 MG/ML IJ SOLN
6.0000 mg | Freq: Once | INTRAMUSCULAR | Status: DC
Start: 2015-03-07 — End: 2015-03-07

## 2015-03-07 MED ORDER — VANCOMYCIN HCL IN DEXTROSE 1-5 GM/200ML-% IV SOLN
1000.0000 mg | Freq: Once | INTRAVENOUS | Status: AC
  Administered 2015-03-07: 1000 mg via INTRAVENOUS
  Filled 2015-03-07: qty 200

## 2015-03-07 MED ORDER — HYDROMORPHONE HCL 1 MG/ML IJ SOLN
1.0000 mg | Freq: Once | INTRAMUSCULAR | Status: AC
  Administered 2015-03-07: 1 mg via INTRAVENOUS
  Filled 2015-03-07: qty 1

## 2015-03-07 MED ORDER — PIPERACILLIN-TAZOBACTAM 3.375 G IVPB
3.3750 g | Freq: Once | INTRAVENOUS | Status: AC
  Administered 2015-03-07: 3.375 g via INTRAVENOUS
  Filled 2015-03-07: qty 50

## 2015-03-07 MED ORDER — ACETAMINOPHEN 650 MG RE SUPP
650.0000 mg | Freq: Once | RECTAL | Status: AC
  Administered 2015-03-07: 650 mg via RECTAL
  Filled 2015-03-07: qty 1

## 2015-03-07 NOTE — H&P (Signed)
[facility] History and Physical  BRENAE LASECKI YQM:[id number] DOB: 1946/01/12 DOA: 03/07/2015  Referring physician: Fayrene Helper, PA-C PCP: Josue Hector, MD   Chief Complaint: Fever and chills.  HPI: Andrea Norton is a 69 y.o. female with a past medical history of multiple bilateral knee surgeries, hypertension, hypothyroidism, hyperlipidemia, coronary artery disease, depression, GERD who comes to the ER with complaints of fever, chills, fatigue associated with right knee tenderness since this morning after she woke up. She denies any type of trauma to the area, but given all the multiple surgeries on the knee, she is probably at an increased risk for infection.   Orthopedic surgery was consulted by the emergency department and they performed an arthrocentesis which showed about 15 ML of amber-colored fluid mixed with blood. This was sent to the lab for Gram stain, culture and sensitivity.  In the ER, she was given analgesics and feels better. She is currently in no acute distress.   Review of Systems:  Constitutional:  Positive Fevers, chills, fatigue. No weight loss, night sweats,  HEENT:  Occasional headaches, Denies Difficulty swallowing,Tooth/dental problems,Sore throat,  No sneezing, itching, ear ache, nasal congestion, post nasal drip,  Cardio-vascular:  No chest pain, Orthopnea, PND, swelling in lower extremities, anasarca, dizziness, palpitations  GI:  Occasional constipation when taking hydromorphone orally. No heartburn, indigestion, abdominal pain, nausea, vomiting, diarrhea,  loss of appetite  Resp:  No shortness of breath with exertion or at rest. No excess mucus, no productive cough, No non-productive cough, No coughing up of blood.No change in color of mucus.No wheezing.No chest wall deformity  Skin:  no rash or lesions.  GU:  no dysuria, change in color of urine, no urgency or frequency. No flank pain.  Musculoskeletal:  No joint pain or  swelling. No decreased range of motion. No back pain.  Psych:  No change in mood or affect. No depression or anxiety. No memory loss.   Past Medical History  Diagnosis Date  . Hyperlipidemia   . Status post primary angioplasty with coronary stent     DRUG-ELUTING STENT X1  TO PROXIMAL LAD  . Chronic back pain   . Chronic neck pain   . Arthritis     HANDS, KNEES  . DDD (degenerative disc disease), cervical   . DDD (degenerative disc disease), lumbar   . Radicular pain in left arm OCCASIONAL PAIN SHOOTING DOWN LEFT ARM SECAONDARY TO  CERVICAL DEGENERATION  . Hemorrhoid   . Diverticulitis of colon   . B12 deficiency anemia   . Vitamin D deficiency   . Hypothyroidism   . Synovitis of knee HYPERTROPHIC RIGHT KNEE  . Abnormal stress test 01-22-2009    LOW RISK ADENOSINE NUCLEAR STUDY W/ PROBABLE MILD APICAL THINNING BUT NO ISCHEMIA  . Depression     states well controlled  . GERD (gastroesophageal reflux disease)   . Inguinal hernia, right     with ventral hernia  . Hypertension       PCP Dr Lysbeth Galas    . Hemorrhoids   . Coronary artery disease 2005    STRESS TEST /, NOte Dr Kirtland Bouchard EPIC/ no cardiologist now--STATES HEART STENT PLACED 2010  . Sleep apnea     couldnt tolerate machine- states last sleep study " many years ago"  . Bronchitis     hx of   . H/O hiatal hernia    Past Surgical History  Procedure Laterality Date  . Knee closed reduction  05-07-2009  RIGHT KNEE  LEFT KNEE  10-01-2009  . Total knee arthroplasty  LEFT  2005    RIGHT  03-13-2009  . Total knee revision  RIGHT 09-30-2009    LEFT 11-25-2009  . Hysteroscopy w/d&c  2002  . Open patellofemoral right knee/ lateral release  05-04-2009  . Knee arthroscopy w/ debridement  01-22-2010    SEPTIC KNEE  . Right knee i & d polythylene revision  02-04-2010    KNEE INFECTED  . Left foot surg.  1990  . Appendectomy  1993  . Knee arthroscopy  LEFT 1999    RIGHT 2004  . Hemicolectomy for diverticulitis  2005  .  Knee arthroscopy  08/03/2011    Procedure: ARTHROSCOPY KNEE;  Surgeon: Loanne Drilling;  Location: [facility];  Service: Orthopedics;  Laterality: Right;  . Synovectomy  08/03/2011    Procedure: SYNOVECTOMY;  Surgeon: Loanne Drilling;  Location: [facility];  Service: Orthopedics;  Laterality: Right;  . I&d knee with poly exchange  02/01/2012    Procedure: IRRIGATION AND DEBRIDEMENT KNEE WITH POLY EXCHANGE;  Surgeon: Loanne Drilling, MD;  Location: [facility];  Service: Orthopedics;  Laterality: Right;  Right Knee Polyethlene Revision   . Colon surgery    . Total knee revision Right 02/20/2013    Procedure: RIGHT TOTAL KNEE ARTHROPLASTY REVISION VERSUS RESECTION ARTHROPLASTY;  Surgeon: Loanne Drilling, MD;  Location: [facility];  Service: Orthopedics;  Laterality: Right;  . Joint replacement      right knee   . Coronary angioplasty  2004    DRUG-ELUTING STENT X1 TO PROXIMAL  LAD  . Cataract extraction w/phaco Right 05/06/2014    Procedure: CATARACT EXTRACTION PHACO AND INTRAOCULAR LENS PLACEMENT (IOC);  Surgeon: Loraine Leriche T. Nile Riggs, MD;  Location: [facility];  Service: Ophthalmology;  Laterality: Right;  CDE 6.41  . Total knee revision Right 04/02/2014    Procedure: RIGHT KNEE FEMORAL ARTHROPLASTY REVISION;  Surgeon: Loanne Drilling, MD;  Location: [facility];  Service: Orthopedics;  Laterality: Right;  . Cataract extraction w/phaco Left 05/20/2014    Procedure: CATARACT EXTRACTION PHACO AND INTRAOCULAR LENS PLACEMENT (IOC);  Surgeon: Loraine Leriche T. Nile Riggs, MD;  Location: [facility];  Service: Ophthalmology;  Laterality: Left;  CDE:4.42  . Laparoscopic gastric sleeve resection N/A 07/21/2014    Procedure: LAPAROSCOPIC GASTRIC SLEEVE RESECTION, TAKEDOWN OF INCARCERATED VENTRAL HERNIA, ENTERAL LYSIS, UPPER ENDOSCOPY;  Surgeon: Valarie Merino, MD;  Location: [facility];  Service: General;  Laterality: N/A;  . Knee arthroscopy Right 12/17/2014    Procedure: RIGHT ARTHROSCOPY KNEE, SYNOVECTOMY;  Surgeon:  Ollen Gross, MD;  Location: [facility];  Service: Orthopedics;  Laterality: Right;   Social History:  reports that she quit smoking about 12 years ago. Her smoking use included Cigarettes. She quit after 39 years of use. She has never used smokeless tobacco. She reports that she does not drink alcohol or use illicit drugs.  No Known Allergies  Family History  Problem Relation Age of Onset  . COPD Mother   . Cancer Father     lung    Prior to Admission medications   Medication Sig Start Date End Date Taking? Authorizing Provider  aspirin EC 81 MG tablet Take 81 mg by mouth at bedtime.    Yes Historical Provider, MD  calcium citrate-vitamin D (CITRACAL+D) 315-200 MG-UNIT per tablet Take 1-2 tablets by mouth daily.   Yes Historical Provider, MD  escitalopram (LEXAPRO) 10 MG tablet Take 10 mg by mouth at bedtime.   Yes  Historical Provider, MD  gabapentin (NEURONTIN) 100 MG capsule Take 200 mg by mouth 2 (two) times daily.   Yes Historical Provider, MD  HYDROmorphone (DILAUDID) 2 MG tablet Take 1-2 tablets by mouth every 8 (eight) hours as needed. pain 02/24/15  Yes Historical Provider, MD  ketoconazole (NIZORAL) 2 % cream Apply 1 application topically daily as needed. Apply to vagina for irritation 02/06/15  Yes Historical Provider, MD  levothyroxine (SYNTHROID, LEVOTHROID) 125 MCG tablet Take 125 mcg by mouth daily before breakfast. Brand Name Only   Yes Historical Provider, MD  metoprolol succinate (TOPROL-XL) 25 MG 24 hr tablet Take 25 mg by mouth daily with lunch.    Yes Historical Provider, MD  pantoprazole (PROTONIX) 40 MG tablet Take 40 mg by mouth daily with lunch.    Yes Historical Provider, MD  Pediatric Multiple Vit-C-FA (FLINSTONES GUMMIES OMEGA-3 DHA PO) Take 1 each by mouth 2 (two) times daily.   Yes Historical Provider, MD  simvastatin (ZOCOR) 40 MG tablet Take 40 mg by mouth daily.   Yes Historical Provider, MD  Vitamin D, Ergocalciferol, (DRISDOL) 50000 UNITS CAPS capsule Take  50,000 Units by mouth every 7 (seven) days. Fridays   Yes Historical Provider, MD  cyanocobalamin (,VITAMIN B-12,) 1000 MCG/ML injection Inject 1,000 mcg into the skin every 30 (thirty) days. 04/01/14   Historical Provider, MD  lisinopril (PRINIVIL,ZESTRIL) 10 MG tablet Take 10 mg by mouth daily with lunch.     Historical Provider, MD   Physical Exam: Filed Vitals:   03/07/15 2100 03/07/15 2130 03/07/15 2233 03/07/15 2241  BP: 105/59 101/52 86/51 86/51   Pulse: 90 91 89 88  Temp:      TempSrc:      Resp: SpO2: 92% 95% 95% 94%    Wt Readings from Last 3 Encounters:  02/05/15 107.956 kg (238 lb)  12/17/14 112.492 kg (248 lb)  12/09/14 112.492 kg (248 lb)    General:  Appears calm and comfortable Eyes: PERRL, normal lids, irises & conjunctiva ENT: grossly normal hearing, lips & tongue Neck: no LAD, masses or thyromegaly Cardiovascular: RRR, no m/r/g. No LE edema. Telemetry: SR, no arrhythmias  Respiratory: CTA bilaterally, no w/r/r. Normal respiratory effort. Abdomen: soft, ntnd Skin: no rash or induration seen on limited exam Musculoskeletal: Bilateral surgical scars on both knees. Her right knee looks significantly erythematosus when compared to the left, it is erythematosus, tender and warm to touch. It shows decreased range of motion.  Psychiatric: grossly normal mood and affect, speech fluent and appropriate Neurologic: grossly non-focal.          Labs on Admission:  Basic Metabolic Panel:  Recent Labs Lab 03/07/15 1852  NA 138  K 4.3  CL 104  CO2 25  GLUCOSE 109*  BUN 14  CREATININE 0.70  CALCIUM 9.2   Liver Function Tests:  Recent Labs Lab 03/07/15 1852  AST 21  ALT 16  ALKPHOS 76  BILITOT 1.0  PROT 7.8  ALBUMIN 3.6   No results for input(s): LIPASE, AMYLASE in the last 168 hours. No results for input(s): AMMONIA in the last 168 hours. CBC:  Recent Labs Lab 03/07/15 1852  WBC 15.8*  NEUTROABS 13.1*  HGB 13.6  HCT 41.6  MCV 90.0    PLT 282    Radiological Exams on Admission: Dg Chest 2 View  03/07/2015   CLINICAL DATA:  Fever chills and generalized body aches.  EXAM: CHEST - 2 VIEW  COMPARISON:  Two-view chest x-ray  01/14/2014  FINDINGS: Heart is within normal limits size. Mild interstitial coarsening is likely chronic. No focal airspace disease is evident. The visualized soft tissues and bony thorax are unremarkable.  IMPRESSION: No acute cardiopulmonary disease or significant interval change.   Electronically Signed   By: Marin Roberts M.D.   On: 03/07/2015 19:36   Dg Knee Complete 4 Views Right  03/07/2015   CLINICAL DATA:  Fever and chills  EXAM: RIGHT KNEE - COMPLETE 4+ VIEW  COMPARISON:  09/27/2012  FINDINGS: Considerable soft tissue swelling is noted surrounding the knee joint. A knee prosthesis is again identified and stable. Significant joint effusion is seen no loosening is identified at this time.  IMPRESSION: Soft tissue swelling and large joint effusion.  No acute bony abnormality is seen.   Electronically Signed   By: Alcide Clever M.D.   On: 03/07/2015 19:39     Assessment/Plan Principal Problem:   Cellulitis of right knee Continue broad-spectrum IV antibiotics. Analgesics as needed. Follow-up blood and synovial fluid cultures and sensitivities. I will like to thank Dr. Jene Every from orthopedic surgery for evaluating the patient.  Active Problems:   Hypothyroidism Continue levothyroxine.    Hyperlipidemia Continue simvastatin. Monitor LFTs.    Essential hypertension Monitor blood pressure. Continue current therapy.    Coronary atherosclerosis Continue aspirin. Continue beta blocker.    GASTROESOPHAGEAL REFLUX DISEASE Continue daily pantoprazole..   Orthopedic surgery was consulted by ER. Dr. Jene Every   Code Status: Full code. DVT Prophylaxis: Lovenox SQ. Family Communication: Leon[place], [place] [phone]  [phone]  Disposition Plan: Admit for IV antibiotics  for several days, ortho follow-up as an inpatient.   Time spent: Over 60 minutes.  Bobette Mo [facility] Pager [phone].

## 2015-03-07 NOTE — ED Provider Notes (Signed)
CSN: [id number]     Arrival date & time 03/07/15  1753 History   First MD Initiated Contact with Patient 03/07/15 1809     Chief Complaint  Patient presents with  . Fever  . Chills  . Generalized Body Aches     (Consider location/radiation/quality/duration/timing/severity/associated sxs/prior Treatment) HPI  69 year old female history of chronic neck and back pain history of arthritis and hyperlipidemia, presenting for evaluation of myalgias. Patient reports today she feels weak, tired, having generalized body aches, fever of 101.7 at home, having chills, and nonproductive cough. She also complaining of right knee pain which is chronic. She denies having any severe headache, neck stiffness, chest pain, shortness of breath, back or abdominal pain aside from her usual pain. She has a significant history of right knee complication status post knee replacement surgery by Dr. Despina Hick.  She has had infection to her right knee in the past. She has had her flu shot and pneumonia shot this year. She denies any dysuria focal numbness or weakness. Denies nausea vomiting or diarrhea. Her right knee pain is the same intensity as it has been for the past 5 months. She did notice that her knee is warm to the touch, more than usual.  Past Medical History  Diagnosis Date  . Hyperlipidemia   . Status post primary angioplasty with coronary stent     DRUG-ELUTING STENT X1  TO PROXIMAL LAD  . Chronic back pain   . Chronic neck pain   . Arthritis     HANDS, KNEES  . DDD (degenerative disc disease), cervical   . DDD (degenerative disc disease), lumbar   . Radicular pain in left arm OCCASIONAL PAIN SHOOTING DOWN LEFT ARM SECAONDARY TO  CERVICAL DEGENERATION  . Hemorrhoid   . Diverticulitis of colon   . B12 deficiency anemia   . Vitamin D deficiency   . Hypothyroidism   . Synovitis of knee HYPERTROPHIC RIGHT KNEE  . Abnormal stress test 01-22-2009    LOW RISK ADENOSINE NUCLEAR STUDY W/ PROBABLE MILD APICAL  THINNING BUT NO ISCHEMIA  . Depression     states well controlled  . GERD (gastroesophageal reflux disease)   . Inguinal hernia, right     with ventral hernia  . Hypertension       PCP Dr Lysbeth Galas    . Hemorrhoids   . Coronary artery disease 2005    STRESS TEST /, NOte Dr Kirtland Bouchard EPIC/ no cardiologist now--STATES HEART STENT PLACED 2010  . Sleep apnea     couldnt tolerate machine- states last sleep study " many years ago"  . Bronchitis     hx of   . H/O hiatal hernia    Past Surgical History  Procedure Laterality Date  . Knee closed reduction  05-07-2009  RIGHT KNEE    LEFT KNEE  10-01-2009  . Total knee arthroplasty  LEFT  2005    RIGHT  03-13-2009  . Total knee revision  RIGHT 09-30-2009    LEFT 11-25-2009  . Hysteroscopy w/d&c  2002  . Open patellofemoral right knee/ lateral release  05-04-2009  . Knee arthroscopy w/ debridement  01-22-2010    SEPTIC KNEE  . Right knee i & d polythylene revision  02-04-2010    KNEE INFECTED  . Left foot surg.  1990  . Appendectomy  1993  . Knee arthroscopy  LEFT 1999    RIGHT 2004  . Hemicolectomy for diverticulitis  2005  . Knee arthroscopy  08/03/2011    Procedure: ARTHROSCOPY  KNEE;  Surgeon: Gus Rankin Aluisio;  Location: [facility];  Service: Orthopedics;  Laterality: Right;  . Synovectomy  08/03/2011    Procedure: SYNOVECTOMY;  Surgeon: Loanne Drilling;  Location: [facility];  Service: Orthopedics;  Laterality: Right;  . I&d knee with poly exchange  02/01/2012    Procedure: IRRIGATION AND DEBRIDEMENT KNEE WITH POLY EXCHANGE;  Surgeon: Loanne Drilling, MD;  Location: [facility];  Service: Orthopedics;  Laterality: Right;  Right Knee Polyethlene Revision   . Colon surgery    . Total knee revision Right 02/20/2013    Procedure: RIGHT TOTAL KNEE ARTHROPLASTY REVISION VERSUS RESECTION ARTHROPLASTY;  Surgeon: Loanne Drilling, MD;  Location: [facility];  Service: Orthopedics;  Laterality: Right;  . Joint replacement       right knee   . Coronary angioplasty  2004    DRUG-ELUTING STENT X1 TO PROXIMAL  LAD  . Cataract extraction w/phaco Right 05/06/2014    Procedure: CATARACT EXTRACTION PHACO AND INTRAOCULAR LENS PLACEMENT (IOC);  Surgeon: Loraine Leriche T. Nile Riggs, MD;  Location: [facility];  Service: Ophthalmology;  Laterality: Right;  CDE 6.41  . Total knee revision Right 04/02/2014    Procedure: RIGHT KNEE FEMORAL ARTHROPLASTY REVISION;  Surgeon: Loanne Drilling, MD;  Location: [facility];  Service: Orthopedics;  Laterality: Right;  . Cataract extraction w/phaco Left 05/20/2014    Procedure: CATARACT EXTRACTION PHACO AND INTRAOCULAR LENS PLACEMENT (IOC);  Surgeon: Loraine Leriche T. Nile Riggs, MD;  Location: [facility];  Service: Ophthalmology;  Laterality: Left;  CDE:4.42  . Laparoscopic gastric sleeve resection N/A 07/21/2014    Procedure: LAPAROSCOPIC GASTRIC SLEEVE RESECTION, TAKEDOWN OF INCARCERATED VENTRAL HERNIA, ENTERAL LYSIS, UPPER ENDOSCOPY;  Surgeon: Valarie Merino, MD;  Location: [facility];  Service: General;  Laterality: N/A;  . Knee arthroscopy Right 12/17/2014    Procedure: RIGHT ARTHROSCOPY KNEE, SYNOVECTOMY;  Surgeon: Ollen Gross, MD;  Location: [facility];  Service: Orthopedics;  Laterality: Right;   Family History  Problem Relation Age of Onset  . COPD Mother   . Cancer Father     lung   Social History  Substance Use Topics  . Smoking status: Former Smoker -- 39 years    Types: Cigarettes    Quit date: 07/31/2002  . Smokeless tobacco: Never Used  . Alcohol Use: No   OB History    No data available     Review of Systems  All other systems reviewed and are negative.     Allergies  Review of patient's allergies indicates no known allergies.  Home Medications   Prior to Admission medications   Medication Sig Start Date End Date Taking? Authorizing Provider  aspirin EC 81 MG tablet Take 81 mg by mouth at bedtime.     Historical Provider, MD  calcium citrate-vitamin D (CITRACAL+D) 315-200 MG-UNIT per tablet Take  1-2 tablets by mouth daily.     Historical Provider, MD  cyanocobalamin (,VITAMIN B-12,) 1000 MCG/ML injection Inject 1,000 mcg into the skin every 30 (thirty) days. 04/01/14   Historical Provider, MD  escitalopram (LEXAPRO) 10 MG tablet Take 10 mg by mouth at bedtime.    Historical Provider, MD  gabapentin (NEURONTIN) 100 MG capsule Take 200 mg by mouth 2 (two) times daily.    Historical Provider, MD  HYDROcodone-acetaminophen (NORCO) 5-325 MG per tablet Take 1-2 tablets by mouth every 6 (six) hours as needed for moderate pain. 12/17/14   Ollen Gross, MD  levothyroxine (SYNTHROID, LEVOTHROID) 125 MCG tablet Take 125 mcg by  mouth daily before breakfast. Brand Name Only    Historical Provider, MD  lisinopril (PRINIVIL,ZESTRIL) 10 MG tablet Take 10 mg by mouth daily with lunch.     Historical Provider, MD  methocarbamol (ROBAXIN) 500 MG tablet Take 1 tablet (500 mg total) by mouth 4 (four) times daily. As needed for muscle spasm 12/17/14   Ollen Gross, MD  metoprolol succinate (TOPROL-XL) 25 MG 24 hr tablet Take 25 mg by mouth daily with lunch.     Historical Provider, MD  oxyCODONE (OXY IR/ROXICODONE) 5 MG immediate release tablet Take 1-2 tablets (5-10 mg total) by mouth every 3 (three) hours as needed for moderate pain, severe pain or breakthrough pain. 04/04/14   [name], PA-C  pantoprazole (PROTONIX) 40 MG tablet Take 40 mg by mouth daily with lunch.     Historical Provider, MD  Pediatric Multiple Vit-C-FA (FLINSTONES GUMMIES OMEGA-3 DHA PO) Take 1 each by mouth 2 (two) times daily.    Historical Provider, MD  simvastatin (ZOCOR) 40 MG tablet Take 40 mg by mouth daily.    Historical Provider, MD  Vitamin D, Ergocalciferol, (DRISDOL) 50000 UNITS CAPS capsule Take 50,000 Units by mouth every 7 (seven) days. Fridays    Historical Provider, MD   BP 143/89 mmHg  Pulse 86  Temp(Src) 99.5 F (37.5 C) (Oral)  Resp 18  SpO2 99% Physical Exam  Constitutional: She appears well-developed and  well-nourished. No distress.  Moderately obese Caucasian female, nontoxic in appearance  HENT:  Head: Atraumatic.  Right Ear: External ear normal.  Left Ear: External ear normal.  Mouth/Throat: Oropharynx is clear and moist.  Eyes: Conjunctivae are normal.  Neck: Neck supple.  No nuchal rigidity  Cardiovascular: Normal rate and regular rhythm.   Pulmonary/Chest: Effort normal and breath sounds normal. No respiratory distress. She has no wheezes. She exhibits no tenderness.  Abdominal: Soft. There is no tenderness.  Musculoskeletal: She exhibits tenderness (right knee: Moderately edematous, with decreased knee flexion secondary to pain however patient states this is usual for her. Knee is warm to the touch without surrounding erythema. Intact pedal pulses.).  Neurological: She is alert.  Skin: No rash noted.  Psychiatric: She has a normal mood and affect.  Nursing note and vitals reviewed.   ED Course  Procedures (including critical care time)  Patient presents with fever chills and myalgias. She has nonproductive cough, chest x-ray ordered. She also has a right knee that is tender and warm to the touch concerning for possible septic joint vs cellulitis. Workup initiated.  8:53 PM Pt's temp is 101.6, her R knee is moderately swollen and warm to the touch.  Xray of right knee shows soft tissue swelling and large joint effusion. Patient with leukocytosis, WBC 15.8 with left shift. Elevated sedimentation rate of 41. Normal lactic acid. Blood culture sent, patient started on broad-spectrum antibiotic, pain and Zosyn. Patient made nothing by mouth. Appreciate consultation from on-call orthopedist, Dr. Shelle Iron who request for patient to be admitted by medicine and he will see patient in the ER to perform knee aspiration along with diagnostics testing. Patient currently received pain medication and monitor closely.  9:26 PM Appreciate consultation from [facility] Dr. Hessie Knows who agrees to see  pt in ER and will admit for further management of her R knee infection.  Dr. Shelle Iron is currently at bedside evaluation pt.  He will perform knee aspiration for diagnostic study.     Labs Review Labs Reviewed  CBC WITH DIFFERENTIAL/PLATELET - Abnormal; Notable for the  following:    WBC 15.8 (*)    Neutrophils Relative % 83 (*)    Neutro Abs 13.1 (*)    Lymphocytes Relative 9 (*)    Monocytes Absolute 1.2 (*)    All other components within normal limits  COMPREHENSIVE METABOLIC PANEL - Abnormal; Notable for the following:    Glucose, Bld 109 (*)    All other components within normal limits  SEDIMENTATION RATE - Abnormal; Notable for the following:    Sed Rate 41 (*)    All other components within normal limits  CULTURE, BLOOD (ROUTINE X 2)  CULTURE, BLOOD (ROUTINE X 2)  C-REACTIVE PROTEIN  I-STAT CG4 LACTIC ACID, ED    Imaging Review Dg Chest 2 View  03/07/2015   CLINICAL DATA:  Fever chills and generalized body aches.  EXAM: CHEST - 2 VIEW  COMPARISON:  Two-view chest x-ray 01/14/2014  FINDINGS: Heart is within normal limits size. Mild interstitial coarsening is likely chronic. No focal airspace disease is evident. The visualized soft tissues and bony thorax are unremarkable.  IMPRESSION: No acute cardiopulmonary disease or significant interval change.   Electronically Signed   By: Marin Roberts M.D.   On: 03/07/2015 19:36   Dg Knee Complete 4 Views Right  03/07/2015   CLINICAL DATA:  Fever and chills  EXAM: RIGHT KNEE - COMPLETE 4+ VIEW  COMPARISON:  09/27/2012  FINDINGS: Considerable soft tissue swelling is noted surrounding the knee joint. A knee prosthesis is again identified and stable. Significant joint effusion is seen no loosening is identified at this time.  IMPRESSION: Soft tissue swelling and large joint effusion.  No acute bony abnormality is seen.   Electronically Signed   By: Alcide Clever M.D.   On: 03/07/2015 19:39   I, Penina Reisner, personally reviewed and evaluated  these images and lab results as part of my medical decision-making.   EKG Interpretation None      MDM   Final diagnoses:  Right knee skin infection    BP 105/59 mmHg  Pulse 90  Temp(Src) 101.6 F (38.7 C) (Rectal)  Resp 23  SpO2 92%  I have reviewed nursing notes and vital signs. I personally viewed the imaging tests through PACS system and agrees with radiologist's intepretation I reviewed available ER/hospitalization records through the EMR     Fayrene Helper, PA-C 03/07/15 2128  Donnetta Hutching, MD 03/08/15 1558

## 2015-03-07 NOTE — Consult Note (Signed)
Reason for Consult: Knee Pain Referring Physician: EDP  History of TKR and revision for loosening by Alusio. Recent knee arthroscopy in May for synovitis. Reports no help or change in knee pain since. Warmth and reddness today and fever. Reports history of knee infection in the past and IV antibiotics for that.  Andrea Norton is an 69 y.o. female.  HPI: See above  Past Medical History  Diagnosis Date  . Hyperlipidemia   . Status post primary angioplasty with coronary stent     DRUG-ELUTING STENT X1  TO PROXIMAL LAD  . Chronic back pain   . Chronic neck pain   . Arthritis     HANDS, KNEES  . DDD (degenerative disc disease), cervical   . DDD (degenerative disc disease), lumbar   . Radicular pain in left arm OCCASIONAL PAIN SHOOTING DOWN LEFT ARM SECAONDARY TO  CERVICAL DEGENERATION  . Hemorrhoid   . Diverticulitis of colon   . B12 deficiency anemia   . Vitamin D deficiency   . Hypothyroidism   . Synovitis of knee HYPERTROPHIC RIGHT KNEE  . Abnormal stress test 01-22-2009    LOW RISK ADENOSINE NUCLEAR STUDY W/ PROBABLE MILD APICAL THINNING BUT NO ISCHEMIA  . Depression     states well controlled  . GERD (gastroesophageal reflux disease)   . Inguinal hernia, right     with ventral hernia  . Hypertension       PCP Dr Edrick Oh    . Hemorrhoids   . Coronary artery disease 2005    STRESS TEST /, NOte Dr Jenkins [place] EPIC/ no cardiologist now--[facility] [place] 2010  . Sleep apnea     couldnt tolerate machine- states last sleep study " many years ago"  . Bronchitis     hx of   . H/O hiatal hernia     Past Surgical History  Procedure Laterality Date  . Knee closed reduction  05-07-2009  RIGHT KNEE    LEFT KNEE  10-01-2009  . Total knee arthroplasty  LEFT  2005    RIGHT  03-13-2009  . Total knee revision  RIGHT 09-30-2009    LEFT 11-25-2009  . Hysteroscopy w/d&c  2002  . Open patellofemoral right knee/ lateral release  05-04-2009  . Knee arthroscopy w/ debridement   01-22-2010    SEPTIC KNEE  . Right knee i & d polythylene revision  02-04-2010    KNEE INFECTED  . Left foot surg.  1990  . Appendectomy  1993  . Knee arthroscopy  LEFT 1999    RIGHT 2004  . Hemicolectomy for diverticulitis  2005  . Knee arthroscopy  08/03/2011    Procedure: ARTHROSCOPY KNEE;  Surgeon: Gearlean Alf;  Location: [place];  Service: Orthopedics;  Laterality: Right;  . Synovectomy  08/03/2011    Procedure: SYNOVECTOMY;  Surgeon: Gearlean Alf;  Location: [place];  Service: Orthopedics;  Laterality: Right;  . I&d knee with poly exchange  02/01/2012    Procedure: IRRIGATION AND DEBRIDEMENT KNEE WITH POLY EXCHANGE;  Surgeon: Gearlean Alf, MD;  Location: [facility];  Service: Orthopedics;  Laterality: Right;  Right Knee Polyethlene Revision   . Colon surgery    . Total knee revision Right 02/20/2013    Procedure: RIGHT TOTAL KNEE ARTHROPLASTY REVISION VERSUS RESECTION ARTHROPLASTY;  Surgeon: Gearlean Alf, MD;  Location: [facility];  Service: Orthopedics;  Laterality: Right;  . Joint replacement      right knee   . Coronary angioplasty  2004    DRUG-ELUTING STENT X1 TO PROXIMAL  LAD  . Cataract extraction w/phaco Right 05/06/2014    Procedure: CATARACT EXTRACTION PHACO AND INTRAOCULAR LENS PLACEMENT (IOC);  Surgeon: Elta Guadeloupe T. Gershon Crane, MD;  Location: [facility];  Service: Ophthalmology;  Laterality: Right;  CDE 6.41  . Total knee revision Right 04/02/2014    Procedure: RIGHT KNEE FEMORAL ARTHROPLASTY REVISION;  Surgeon: Gearlean Alf, MD;  Location: [facility];  Service: Orthopedics;  Laterality: Right;  . Cataract extraction w/phaco Left 05/20/2014    Procedure: CATARACT EXTRACTION PHACO AND INTRAOCULAR LENS PLACEMENT (IOC);  Surgeon: Elta Guadeloupe T. Gershon Crane, MD;  Location: [facility];  Service: Ophthalmology;  Laterality: Left;  CDE:4.42  . Laparoscopic gastric sleeve resection N/A 07/21/2014    Procedure: LAPAROSCOPIC GASTRIC SLEEVE RESECTION, TAKEDOWN OF  INCARCERATED VENTRAL HERNIA, ENTERAL LYSIS, UPPER ENDOSCOPY;  Surgeon: Pedro Earls, MD;  Location: [facility];  Service: General;  Laterality: N/A;  . Knee arthroscopy Right 12/17/2014    Procedure: RIGHT ARTHROSCOPY KNEE, SYNOVECTOMY;  Surgeon: Gaynelle Arabian, MD;  Location: [facility];  Service: Orthopedics;  Laterality: Right;    Family History  Problem Relation Age of Onset  . COPD Mother   . Cancer Father     lung    Social History:  reports that she quit smoking about 12 years ago. Her smoking use included Cigarettes. She quit after 39 years of use. She has never used smokeless tobacco. She reports that she does not drink alcohol or use illicit drugs.  Allergies: No Known Allergies  Medications: I have reviewed the patient's current medications.  Results for orders placed or performed during the hospital encounter of 03/07/15 (from the past 48 hour(s))  CBC with Differential/Platelet     Status: Abnormal   Collection Time: 03/07/15  6:52 PM  Result Value Ref Range   WBC 15.8 (H) 4.0 - 10.5 K/uL   RBC 4.62 3.87 - 5.11 MIL/uL   Hemoglobin 13.6 12.0 - 15.0 g/dL   HCT 41.6 36.0 - 46.0 %   MCV 90.0 78.0 - 100.0 fL   MCH 29.4 26.0 - 34.0 pg   MCHC 32.7 30.0 - 36.0 g/dL   RDW 13.8 11.5 - 15.5 %   Platelets 282 150 - 400 K/uL   Neutrophils Relative % 83 (H) 43 - 77 %   Neutro Abs 13.1 (H) 1.7 - 7.7 K/uL   Lymphocytes Relative 9 (L) 12 - 46 %   Lymphs Abs 1.3 0.7 - 4.0 K/uL   Monocytes Relative 7 3 - 12 %   Monocytes Absolute 1.2 (H) 0.1 - 1.0 K/uL   Eosinophils Relative 1 0 - 5 %   Eosinophils Absolute 0.2 0.0 - 0.7 K/uL   Basophils Relative 0 0 - 1 %   Basophils Absolute 0.0 0.0 - 0.1 K/uL  Comprehensive metabolic panel     Status: Abnormal   Collection Time: 03/07/15  6:52 PM  Result Value Ref Range   Sodium 138 135 - 145 mmol/L   Potassium 4.3 3.5 - 5.1 mmol/L   Chloride 104 101 - 111 mmol/L   CO2 25 22 - 32 mmol/L   Glucose, Bld 109 (H) 65 - 99 mg/dL   BUN 14 6 - 20  mg/dL   Creatinine, Ser 0.70 0.44 - 1.00 mg/dL   Calcium 9.2 8.9 - 10.3 mg/dL   Total Protein 7.8 6.5 - 8.1 g/dL   Albumin 3.6 3.5 - 5.0 g/dL   AST 21 15 - 41 U/L  ALT 16 14 - 54 U/L   Alkaline Phosphatase 76 38 - 126 U/L   Total Bilirubin 1.0 0.3 - 1.2 mg/dL   GFR calc non Af Amer >60 >60 mL/min   GFR calc Af Amer >60 >60 mL/min    Comment: (NOTE) The eGFR has been calculated using the CKD EPI equation. This calculation has not been validated in all clinical situations. eGFR's persistently <60 mL/min signify possible Chronic Kidney Disease.    Anion gap 9 5 - 15  Sedimentation rate     Status: Abnormal   Collection Time: 03/07/15  6:52 PM  Result Value Ref Range   Sed Rate 41 (H) 0 - 22 mm/hr  I-Stat CG4 Lactic Acid, ED     Status: None   Collection Time: 03/07/15  6:57 PM  Result Value Ref Range   Lactic Acid, Venous 1.56 0.5 - 2.0 mmol/L    Dg Chest 2 View  03/07/2015   CLINICAL DATA:  Fever chills and generalized body aches.  EXAM: CHEST - 2 VIEW  COMPARISON:  Two-view chest x-ray 01/14/2014  FINDINGS: Heart is within normal limits size. Mild interstitial coarsening is likely chronic. No focal airspace disease is evident. The visualized soft tissues and bony thorax are unremarkable.  IMPRESSION: No acute cardiopulmonary disease or significant interval change.   Electronically Signed   By: San Morelle M.D.   On: 03/07/2015 19:36   Dg Knee Complete 4 Views Right  03/07/2015   CLINICAL DATA:  Fever and chills  EXAM: RIGHT KNEE - COMPLETE 4+ VIEW  COMPARISON:  09/27/2012  FINDINGS: Considerable soft tissue swelling is noted surrounding the knee joint. A knee prosthesis is again identified and stable. Significant joint effusion is seen no loosening is identified at this time.  IMPRESSION: Soft tissue swelling and large joint effusion.  No acute bony abnormality is seen.   Electronically Signed   By: Inez Catalina M.D.   On: 03/07/2015 19:39    Review of Systems   Constitutional: Positive for fever.  Musculoskeletal: Positive for joint pain.  All other systems reviewed and are negative.  Blood pressure 101/52, pulse 91, temperature 101.6 F (38.7 C), temperature source Rectal, resp. rate 30, SpO2 95 %. Physical Exam  Constitutional: She is oriented to person, place, and time. She appears well-developed.  HENT:  Head: Normocephalic.  Eyes: Pupils are equal, round, and reactive to light.  Neck: Normal range of motion.  Cardiovascular: Normal rate.   Respiratory: Effort normal.  GI: Soft.  Musculoskeletal:  Right knee  Effusion. Some erythema center knee and distal tibia. Small abrasion over anterior knee. No DVT. Some pain with motion of knee but no change Recent. Compartments soft.  Neurological: She is alert and oriented to person, place, and time.  Skin: Skin is warm and dry.  Psychiatric: She has a normal mood and affect.  Reviewed medical records. Unable to ID treatment for infection.  Assessment/Plan:  Cellulitis Right knee and leg. Chronic knee pain S/P TKR and revision and arthroscopy in May for Synovitis. Possible Infected TKR. Plan Aspiration right knee. After sterile prep away from erythema under sterile technique aspirated 15 cc  tea color fluid. Sent for Gm stain and cultures. Hopitalist admission. IV Ab broad spectrum.  Discussed with family may need Debr and exchange or resection arthroplasty.  Roth Ress C 03/07/2015, 9:52 PM

## 2015-03-07 NOTE — ED Notes (Signed)
Patient here with reports of fever, chills, and generalized body aches that started today. Denies n/v/d.

## 2015-03-07 NOTE — ED Notes (Signed)
Delay in 2nd set of blood cultures, pt in exray 

## 2015-03-08 LAB — COMPREHENSIVE METABOLIC PANEL
ALBUMIN: 3.1 g/dL — AB (ref 3.5–5.0)
ALK PHOS: 63 U/L (ref 38–126)
ALT: 13 U/L — ABNORMAL LOW (ref 14–54)
ANION GAP: 5 (ref 5–15)
AST: 18 U/L (ref 15–41)
BILIRUBIN TOTAL: 1.5 mg/dL — AB (ref 0.3–1.2)
BUN: 16 mg/dL (ref 6–20)
CHLORIDE: 105 mmol/L (ref 101–111)
CO2: 28 mmol/L (ref 22–32)
Calcium: 8.7 mg/dL — ABNORMAL LOW (ref 8.9–10.3)
Creatinine, Ser: 0.91 mg/dL (ref 0.44–1.00)
GFR calc Af Amer: >60 mL/min (ref >60)
GFR calc non Af Amer: >60 mL/min (ref >60)
Glucose, Bld: 109 mg/dL — ABNORMAL HIGH (ref 65–99)
Potassium: 4.1 mmol/L (ref 3.5–5.1)
SODIUM: 138 mmol/L (ref 135–145)
TOTAL PROTEIN: 6.7 g/dL (ref 6.5–8.1)

## 2015-03-08 LAB — CBC WITH DIFFERENTIAL/PLATELET
BASOS ABS: 0 10*3/uL (ref 0.0–0.1)
BASOS PCT: 0 % (ref 0–1)
EOS PCT: 1 % (ref 0–5)
Eosinophils Absolute: 0.1 10*3/uL (ref 0.0–0.7)
HEMATOCRIT: 39.2 % (ref 36.0–46.0)
HEMOGLOBIN: 12.7 g/dL (ref 12.0–15.0)
LYMPHS ABS: 2 10*3/uL (ref 0.7–4.0)
LYMPHS PCT: 13 % (ref 12–46)
MCH: 29.4 pg (ref 26.0–34.0)
MCHC: 32.4 g/dL (ref 30.0–36.0)
MCV: 90.7 fL (ref 78.0–100.0)
MONO ABS: 1.5 10*3/uL — AB (ref 0.1–1.0)
Monocytes Relative: 10 % (ref 3–12)
Neutro Abs: 11.6 10*3/uL — ABNORMAL HIGH (ref 1.7–7.7)
Neutrophils Relative %: 76 % (ref 43–77)
Platelets: 274 10*3/uL (ref 150–400)
RBC: 4.32 MIL/uL (ref 3.87–5.11)
RDW: 14.2 % (ref 11.5–15.5)
WBC: 15.3 10*3/uL — ABNORMAL HIGH (ref 4.0–10.5)

## 2015-03-08 LAB — C-REACTIVE PROTEIN: CRP: 1.4 mg/dL — ABNORMAL HIGH (ref <1.0)

## 2015-03-08 MED ORDER — HYDROMORPHONE HCL 1 MG/ML IJ SOLN
1.0000 mg | INTRAMUSCULAR | Status: DC | PRN
  Filled 2015-03-08: qty 1

## 2015-03-08 MED ORDER — PANTOPRAZOLE SODIUM 40 MG PO TBEC
40.0000 mg | DELAYED_RELEASE_TABLET | Freq: Every day | ORAL | Status: DC
  Administered 2015-03-08 – 2015-03-12 (×5): 40 mg via ORAL
  Filled 2015-03-08 (×5): qty 1

## 2015-03-08 MED ORDER — ONDANSETRON HCL 4 MG/2ML IJ SOLN: 4.0000 mg | Freq: Four times a day (QID) | INTRAMUSCULAR | Status: DC | PRN

## 2015-03-08 MED ORDER — GABAPENTIN 100 MG PO CAPS
200.0000 mg | ORAL_CAPSULE | Freq: Two times a day (BID) | ORAL | Status: DC
  Administered 2015-03-08 – 2015-03-12 (×10): 200 mg via ORAL
  Filled 2015-03-08 (×12): qty 2

## 2015-03-08 MED ORDER — METOPROLOL SUCCINATE ER 25 MG PO TB24
25.0000 mg | ORAL_TABLET | Freq: Every day | ORAL | Status: DC
  Administered 2015-03-08 – 2015-03-11 (×4): 25 mg via ORAL
  Filled 2015-03-08 (×5): qty 1

## 2015-03-08 MED ORDER — LEVOTHYROXINE SODIUM 125 MCG PO TABS
125.0000 ug | ORAL_TABLET | Freq: Every day | ORAL | Status: DC
  Administered 2015-03-08 – 2015-03-12 (×5): 125 ug via ORAL
  Filled 2015-03-08 (×7): qty 1

## 2015-03-08 MED ORDER — PIPERACILLIN-TAZOBACTAM 3.375 G IVPB
3.3750 g | Freq: Three times a day (TID) | INTRAVENOUS | Status: DC
  Administered 2015-03-08 – 2015-03-11 (×11): 3.375 g via INTRAVENOUS
  Filled 2015-03-08 (×12): qty 50

## 2015-03-08 MED ORDER — ENOXAPARIN SODIUM 60 MG/0.6ML ~~LOC~~ SOLN
50.0000 mg | SUBCUTANEOUS | Status: DC
  Administered 2015-03-08 – 2015-03-09 (×2): 50 mg via SUBCUTANEOUS
  Filled 2015-03-08 (×3): qty 0.6

## 2015-03-08 MED ORDER — ESCITALOPRAM OXALATE 10 MG PO TABS
10.0000 mg | ORAL_TABLET | Freq: Every day | ORAL | Status: DC
  Administered 2015-03-08 – 2015-03-11 (×5): 10 mg via ORAL
  Filled 2015-03-08 (×6): qty 1

## 2015-03-08 MED ORDER — KETOROLAC TROMETHAMINE 30 MG/ML IJ SOLN
30.0000 mg | Freq: Once | INTRAMUSCULAR | Status: AC
  Administered 2015-03-08: 30 mg via INTRAVENOUS
  Filled 2015-03-08: qty 1

## 2015-03-08 MED ORDER — ACETAMINOPHEN 650 MG RE SUPP: 650.0000 mg | Freq: Four times a day (QID) | RECTAL | Status: DC | PRN

## 2015-03-08 MED ORDER — ACETAMINOPHEN 325 MG PO TABS
650.0000 mg | ORAL_TABLET | Freq: Four times a day (QID) | ORAL | Status: DC | PRN
  Administered 2015-03-08 – 2015-03-11 (×5): 650 mg via ORAL
  Filled 2015-03-08 (×5): qty 2

## 2015-03-08 MED ORDER — SODIUM CHLORIDE 0.9 % IV SOLN
INTRAVENOUS | Status: DC
  Administered 2015-03-08 – 2015-03-10 (×4): via INTRAVENOUS
  Administered 2015-03-11: 75 mL/h via INTRAVENOUS

## 2015-03-08 MED ORDER — LISINOPRIL 10 MG PO TABS
10.0000 mg | ORAL_TABLET | Freq: Every day | ORAL | Status: DC
  Administered 2015-03-08: 10 mg via ORAL
  Filled 2015-03-08 (×2): qty 1

## 2015-03-08 MED ORDER — ONDANSETRON HCL 4 MG PO TABS: 4.0000 mg | ORAL_TABLET | Freq: Four times a day (QID) | ORAL | Status: DC | PRN

## 2015-03-08 MED ORDER — VANCOMYCIN HCL IN DEXTROSE 1-5 GM/200ML-% IV SOLN
1000.0000 mg | Freq: Two times a day (BID) | INTRAVENOUS | Status: DC
  Administered 2015-03-08 – 2015-03-10 (×5): 1000 mg via INTRAVENOUS
  Filled 2015-03-08 (×5): qty 200

## 2015-03-08 MED ORDER — ASPIRIN EC 81 MG PO TBEC
81.0000 mg | DELAYED_RELEASE_TABLET | Freq: Every day | ORAL | Status: DC
  Administered 2015-03-08 – 2015-03-09 (×3): 81 mg via ORAL
  Filled 2015-03-08 (×4): qty 1

## 2015-03-08 NOTE — Progress Notes (Addendum)
  S: H/o multiple R knee surgeries. Admitted yesterday with R knee pain / swelling/ cellulitis. Aspirated yesterday. On IV abx. States Cellulitis is improved o/n with IV abx.  O: AF VSS GEN: NAD, A&O x3 ABD: SNTND RLE: swollen, + effusion. Erythema retreating from drawn lines. Pain with ROM. NVI distally.  WBC 15.3 ESR 41 Synovial fluid: 83K WBC with 99% N, neg gram stain  A/P: Periprosthetic joint infection, R TKA -Cont IV abx to settle down cellulitis, which is improving -Will discuss definitive treatment plan / timing with Dr. Wynelle Link, OR probably Wednesday

## 2015-03-08 NOTE — Progress Notes (Signed)
PROGRESS NOTE  OLAYINKA GATHERS AVW:[id number] DOB: 04-Dec-1945 DOA: 03/07/2015 PCP: Josue Hector, MD   HPI: Andrea Norton is a 69 y.o. female with a past medical history of multiple bilateral knee surgeries, hypertension, hypothyroidism, hyperlipidemia, coronary artery disease, depression, GERD who comes to the ER with complaints of fever, chills, fatigue associated with right knee tenderness since this morning after she woke up. Orthopedic surgery was consulted and asked [facility] to admit.  Subjective / 24 H Interval events - knee feels better this morning - no chest pain, shortness of breath, no abdominal pain, nausea or vomiting.    Assessment/Plan: Principal Problem:   Cellulitis of right knee Active Problems:   Hypothyroidism   Hyperlipidemia   Essential hypertension   Coronary atherosclerosis   GASTROESOPHAGEAL REFLUX DISEASE  Right knee infection with cellulitis - orthopedic surgery following, definitive decision for OR per Dr. Despina Hick - improving based on margins drawn last night, continue broad spectrum IV antibiotics - will add on cell count on sample drawn last night - gram stain negative today, cultures pending   Hypothyroidism - Continue levothyroxine.  Hyperlipidemia - Continue simvastatin.  Essential hypertension - Continue current therapy.   Diet: Diet NPO time specified Fluids: none DVT Prophylaxis: Lovenox  Code Status: Full Code Family Communication: d/w husband bedside  Disposition Plan: remain inpatient  Consultants:  Orthopedic surgery   Procedures:  None    Antibiotics Vancomycin 8/13 >> Zosyn 8/13 >>    Studies  Dg Chest 2 View  03/07/2015   CLINICAL DATA:  Fever chills and generalized body aches.  EXAM: CHEST - 2 VIEW  COMPARISON:  Two-view chest x-ray 01/14/2014  FINDINGS: Heart is within normal limits size. Mild interstitial coarsening is likely chronic. No focal airspace disease is evident. The visualized soft tissues  and bony thorax are unremarkable.  IMPRESSION: No acute cardiopulmonary disease or significant interval change.   Electronically Signed   By: Marin Roberts M.D.   On: 03/07/2015 19:36   Dg Knee Complete 4 Views Right  03/07/2015   CLINICAL DATA:  Fever and chills  EXAM: RIGHT KNEE - COMPLETE 4+ VIEW  COMPARISON:  09/27/2012  FINDINGS: Considerable soft tissue swelling is noted surrounding the knee joint. A knee prosthesis is again identified and stable. Significant joint effusion is seen no loosening is identified at this time.  IMPRESSION: Soft tissue swelling and large joint effusion.  No acute bony abnormality is seen.   Electronically Signed   By: Alcide Clever M.D.   On: 03/07/2015 19:39    Objective  Filed Vitals:   03/07/15 2300 03/07/15 2330 03/08/15 0003 03/08/15 0441  BP: 106/52  Pulse: 82 80 79 61  Temp:   98.4 F (36.9 C) 97.7 F (36.5 C)  TempSrc:   Oral Oral  Resp: Height:   5' 4.5" (1.638 m)   Weight:   108.001 kg (238 lb 1.6 oz)   SpO2: 93% 97% 99% 97%   No intake or output data in the 24 hours ending 03/08/15 0817 Filed Weights   03/08/15 0003  Weight: 108.001 kg (238 lb 1.6 oz)    Exam:  GENERAL: NAD  HEENT: head NCAT, no scleral icterus  LUNGS: Clear to auscultation. No wheezing or crackles  HEART: Regular rate and rhythm without murmur. 2+ pulses, no JVD, no peripheral edema  ABDOMEN: Soft, nontender, and nondistended.  EXTREMITIES: right knee mildly swollen and erythematous, tender, cellulitis receeding based on margins drawn  on admission   NEUROLOGIC: Alert and oriented x3.    Data Reviewed: Basic Metabolic Panel:  Recent Labs Lab 03/07/15 1852 03/08/15 0422  NA 138 138  K 4.3 4.1  CL 104 105  CO2 25 28  GLUCOSE 109* 109*  BUN 14 16  CREATININE 0.70 0.91  CALCIUM 9.2 8.7*   Liver Function Tests:  Recent Labs Lab 03/07/15 1852 03/08/15 0422  AST 21 18  ALT 16 13*  ALKPHOS 76 63  BILITOT 1.0  1.5*  PROT 7.8 6.7  ALBUMIN 3.6 3.1*   CBC:  Recent Labs Lab 03/07/15 1852 03/08/15 0422  WBC 15.8* 15.3*  NEUTROABS 13.1* 11.6*  HGB 13.6 12.7  HCT 41.6 39.2  MCV 90.0 90.7  PLT 282 274    Recent Results (from the past 240 hour(s))  Body fluid culture     Status: None (Preliminary result)   Collection Time: 03/07/15 10:40 PM  Result Value Ref Range Status   Specimen Description SYNOVIAL RIGHT KNEE  Final   Special Requests NONE  Final   Gram Stain   Final    WBC PRESENT,BOTH PMN AND MONONUCLEAR CYTOSPIN SMEAR NO ORGANISMS SEEN RESULT CALLED TO, READ BACK BY AND VERIFIED WITH: Sondra Barges RN @ 0000 ON 03/08/15 BY C DAVIS   Culture PENDING  Incomplete   Report Status PENDING  Incomplete     Scheduled Meds: . aspirin EC  81 mg Oral QHS  . enoxaparin (LOVENOX) injection  50 mg Subcutaneous Q24H  . escitalopram  10 mg Oral QHS  . gabapentin  200 mg Oral BID  . levothyroxine  125 mcg Oral QAC breakfast  . lisinopril  10 mg Oral Q lunch  . metoprolol succinate  25 mg Oral Q lunch  . pantoprazole  40 mg Oral Q lunch  . piperacillin-tazobactam (ZOSYN)  IV  3.375 g Intravenous Q8H  . vancomycin  1,000 mg Intravenous Q12H   Continuous Infusions: . sodium chloride 75 mL/hr at 03/08/15 0012     Pamella Pert, MD [facility] Pager [phone]. If 7 PM - 7 AM, please contact night-coverage at [email or web address], password [facility] 03/08/2015, 8:17 AM  LOS: 1 day

## 2015-03-09 DIAGNOSIS — I959 Hypotension, unspecified: Secondary | ICD-10-CM

## 2015-03-09 DIAGNOSIS — R197 Diarrhea, unspecified: Secondary | ICD-10-CM

## 2015-03-09 LAB — COMPREHENSIVE METABOLIC PANEL
ALT: 43 U/L (ref 14–54)
ANION GAP: 6 (ref 5–15)
AST: 40 U/L (ref 15–41)
Albumin: 2.6 g/dL — ABNORMAL LOW (ref 3.5–5.0)
Alkaline Phosphatase: 75 U/L (ref 38–126)
BILIRUBIN TOTAL: 1.2 mg/dL (ref 0.3–1.2)
BUN: 10 mg/dL (ref 6–20)
CHLORIDE: 109 mmol/L (ref 101–111)
CO2: 25 mmol/L (ref 22–32)
Calcium: 8.4 mg/dL — ABNORMAL LOW (ref 8.9–10.3)
Creatinine, Ser: 0.7 mg/dL (ref 0.44–1.00)
GFR calc Af Amer: >60 mL/min (ref >60)
Glucose, Bld: 114 mg/dL — ABNORMAL HIGH (ref 65–99)
POTASSIUM: 3.8 mmol/L (ref 3.5–5.1)
Sodium: 140 mmol/L (ref 135–145)
TOTAL PROTEIN: 6.2 g/dL — AB (ref 6.5–8.1)

## 2015-03-09 LAB — CBC WITH DIFFERENTIAL/PLATELET
BASOS PCT: 0 % (ref 0–1)
Basophils Absolute: 0 10*3/uL (ref 0.0–0.1)
EOS PCT: 2 % (ref 0–5)
Eosinophils Absolute: 0.2 10*3/uL (ref 0.0–0.7)
HEMATOCRIT: 35.6 % — AB (ref 36.0–46.0)
Hemoglobin: 11.3 g/dL — ABNORMAL LOW (ref 12.0–15.0)
LYMPHS PCT: 16 % (ref 12–46)
Lymphs Abs: 1.6 10*3/uL (ref 0.7–4.0)
MCH: 28.3 pg (ref 26.0–34.0)
MCHC: 31.7 g/dL (ref 30.0–36.0)
MCV: 89 fL (ref 78.0–100.0)
MONO ABS: 1 10*3/uL (ref 0.1–1.0)
MONOS PCT: 11 % (ref 3–12)
NEUTROS ABS: 6.9 10*3/uL (ref 1.7–7.7)
Neutrophils Relative %: 71 % (ref 43–77)
PLATELETS: 229 10*3/uL (ref 150–400)
RBC: 4 MIL/uL (ref 3.87–5.11)
RDW: 13.9 % (ref 11.5–15.5)
WBC: 9.7 10*3/uL (ref 4.0–10.5)

## 2015-03-09 MED ORDER — SENNOSIDES-DOCUSATE SODIUM 8.6-50 MG PO TABS: 2.0000 | ORAL_TABLET | Freq: Every evening | ORAL | Status: DC | PRN

## 2015-03-09 MED ORDER — HYDROMORPHONE HCL 1 MG/ML IJ SOLN
0.5000 mg | INTRAMUSCULAR | Status: DC | PRN
  Administered 2015-03-09 – 2015-03-10 (×6): 0.5 mg via INTRAVENOUS
  Filled 2015-03-09 (×6): qty 1

## 2015-03-09 NOTE — Care Management Important Message (Signed)
Important Message  Patient Details  Name: Andrea Norton MRN: [id number] Date of Birth: [birth year]   Medicare Important Message Given:  Yes-second notification given    Renie Ora 03/09/2015, 2:57 PMImportant Message  Patient Details  Name: Andrea Norton MRN: [id number] Date of Birth: Jan 18, 1946   Medicare Important Message Given:  Yes-second notification given    Renie Ora 03/09/2015, 2:56 PM

## 2015-03-09 NOTE — Progress Notes (Signed)
ANTIBIOTIC CONSULT NOTE - INITIAL  Pharmacy Consult for vancomycin Indication: cellulitis, possible septic arthritis  No Known Allergies  Patient Measurements: Height: 5' 4.5" (163.8 cm) Weight: 238 lb 1.6 oz (108.001 kg) IBW/kg (Calculated) : 55.85 Adjusted Body Weight:   Vital Signs: Temp: 98.5 F (36.9 C) (08/15 0518) Temp Source: Oral (08/15 0518) BP: 109/53 mmHg (08/15 0518) Pulse Rate: 78 (08/15 0518) Intake/Output from previous day: 08/14 0701 - 08/15 0700 In: 1200 [P.O.:350; I.V.:600; IV Piggyback:250] Out: -  Intake/Output from this shift:    Labs:  Recent Labs  03/07/15 1852 03/08/15 0422 03/09/15 0424  WBC 15.8* 15.3* 9.7  HGB 13.6 12.7 11.3*  PLT 282 274 229  CREATININE 0.70 0.91 0.70   Estimated Creatinine Clearance: 82.6 mL/min (by C-G formula based on Cr of 0.7). No results for input(s): VANCOTROUGH, VANCOPEAK, VANCORANDOM, GENTTROUGH, GENTPEAK, GENTRANDOM, TOBRATROUGH, TOBRAPEAK, TOBRARND, AMIKACINPEAK, AMIKACINTROU, AMIKACIN in the last 72 hours.   Microbiology: Recent Results (from the past 720 hour(s))  Body fluid culture     Status: None (Preliminary result)   Collection Time: 03/07/15 10:40 PM  Result Value Ref Range Status   Specimen Description SYNOVIAL RIGHT KNEE  Final   Special Requests NONE  Final   Gram Stain   Final    WBC PRESENT,BOTH PMN AND MONONUCLEAR CYTOSPIN SMEAR NO ORGANISMS SEEN RESULT CALLED TO, READ BACK BY AND VERIFIED WITH: Sondra Barges RN @ 0000 ON 03/08/15 BY C DAVIS   Culture PENDING  Incomplete   Report Status PENDING  Incomplete    Medical History: Past Medical History  Diagnosis Date  . Hyperlipidemia   . Status post primary angioplasty with coronary stent     DRUG-ELUTING STENT X1  TO PROXIMAL LAD  . Chronic back pain   . Chronic neck pain   . Arthritis     HANDS, KNEES  . DDD (degenerative disc disease), cervical   . DDD (degenerative disc disease), lumbar   . Radicular pain in left arm OCCASIONAL PAIN  SHOOTING DOWN LEFT ARM SECAONDARY TO  CERVICAL DEGENERATION  . Hemorrhoid   . Diverticulitis of colon   . B12 deficiency anemia   . Vitamin D deficiency   . Hypothyroidism   . Synovitis of knee HYPERTROPHIC RIGHT KNEE  . Abnormal stress test 01-22-2009    LOW RISK ADENOSINE NUCLEAR STUDY W/ PROBABLE MILD APICAL THINNING BUT NO ISCHEMIA  . Depression     states well controlled  . GERD (gastroesophageal reflux disease)   . Inguinal hernia, right     with ventral hernia  . Hypertension       PCP Dr Lysbeth Galas    . Hemorrhoids   . Coronary artery disease 2005    STRESS TEST /, NOte Dr Kirtland Bouchard EPIC/ no cardiologist now--STATES HEART STENT PLACED 2010  . Sleep apnea     couldnt tolerate machine- states last sleep study " many years ago"  . Bronchitis     hx of   . H/O hiatal hernia    Assessment: 33 YOM w/ multiple BL knee surgeries with cellulitis including possible periprosthetic joint infection. Knee aspiration performed.  Vancomycin and zosyn started per [facility], received permission for pharmacy to dose and monitor vancomycin  8/14 >> zosyn(MD) >>  8/14 >> vancomycin >>   8/13 blood:collected  8/13 synovial fluid: IP  C. Diff: ordered but no stools documented recently, previously having loose stools   Renal: SCr is WNL WBC improved to WNL afebrile  Dose changes/levels: 8/16 0830 VT =  ___mcg/ml on vancomycin 1gm IV q12h prior to 6th dose   Goal of Therapy:  Vancomycin trough level 15-20 mcg/ml, for possible prosthetic joint involvement  Plan:  Day #2 vancomycin and pip/tazo  Continue vancomycin 1gm IV q12h and plan to check trough in am  Zosyn extended infusion dosing remains appropriat  Await culture results and possible I&D  Dannielle Huh 03/09/2015,9:13 AM

## 2015-03-09 NOTE — Progress Notes (Signed)
Subjective: Patient states nee still hurts but redness over knee and leg is much better   Objective: Vital signs in last 24 hours: Temp:  [98 F (36.7 C)-98.6 F (37 C)] 98.5 F (36.9 C) (08/15 0518) Pulse Rate:  [60-78] 78 (08/15 0518) Resp:  [20-24] 20 (08/15 0518) BP: (92-124)/(53-74) 109/53 mmHg (08/15 0518) SpO2:  [95 %-100 %] 95 % (08/15 0518)  Intake/Output from previous day: 08/14 0701 - 08/15 0700 In: 1200 [P.O.:350; I.V.:600; IV Piggyback:250] Out: -  Intake/Output this shift:     Recent Labs  03/07/15 1852 03/08/15 0422 03/09/15 0424  HGB 13.6 12.7 11.3*    Recent Labs  03/08/15 0422 03/09/15 0424  WBC 15.3* 9.7  RBC 4.32 4.00  HCT 39.2 35.6*  PLT 274 229    Recent Labs  03/08/15 0422 03/09/15 0424  NA 138 140  K 4.1 3.8  CL 105 109  CO2 28 25  BUN 16 10  CREATININE 0.91 0.70  GLUCOSE 109* 114*  CALCIUM 8.7* 8.4*   No results for input(s): LABPT, INR in the last 72 hours.  Neurologically intact slight warmth right knee; moderate swelling; minimal cellulitis  Assessment/Plan: Right knee cellulitis/? Septic arthritis- WBC and erythema normalized. Continue IV antibiotics. Will follow to see if knee improves over next 48 hours to see if she needs surgical debridement   Andrea Norton V 03/09/2015, 7:17 AM

## 2015-03-09 NOTE — Progress Notes (Signed)
PROGRESS NOTE  Andrea LAUBSCHER Norton:[id number] DOB: 1946/01/20 DOA: 03/07/2015 PCP: Josue Hector, MD   HPI: Andrea Norton is a 69 y.o. female with a past medical history of multiple bilateral knee surgeries, hypertension, hypothyroidism, hyperlipidemia, coronary artery disease, depression, GERD who comes to the ER with complaints of fever, chills, fatigue associated with right knee tenderness since this morning after she woke up. Orthopedic surgery was consulted and asked [facility] to admit.  Subjective / 24 H Interval events - knee feels better this morning - few diarrheal stools yesterday, none since   Assessment/Plan: Principal Problem:   Cellulitis of right knee Active Problems:   Hypothyroidism   Hyperlipidemia   Essential hypertension   Coronary atherosclerosis   GASTROESOPHAGEAL REFLUX DISEASE  Right knee infection with cellulitis - orthopedic surgery following, definitive decision for OR per Dr. Despina Hick  Diarrhea - C diff pending, no stools overnight or today, d/c C diff and precautions if she has no diarrhea for 48h  Hypotension  - shortly after IV dilaudid, decrease dose today and discontinue Lisinopril today  - monitor  Hypothyroidism - Continue levothyroxine.  Hyperlipidemia - Continue simvastatin.  Essential hypertension - Continue current therapy.   Diet: Diet regular Room service appropriate?: Yes; Fluid consistency:: Thin Fluids: none DVT Prophylaxis: Lovenox  Code Status: Full Code Family Communication: d/w husband bedside  Disposition Plan: remain inpatient  Consultants:  Orthopedic surgery   Procedures:  None    Antibiotics Vancomycin 8/13 >> Zosyn 8/13 >>    Studies  Dg Chest 2 View  03/07/2015   CLINICAL DATA:  Fever chills and generalized body aches.  EXAM: CHEST - 2 VIEW  COMPARISON:  Two-view chest x-ray 01/14/2014  FINDINGS: Heart is within normal limits size. Mild interstitial coarsening is likely chronic. No focal  airspace disease is evident. The visualized soft tissues and bony thorax are unremarkable.  IMPRESSION: No acute cardiopulmonary disease or significant interval change.   Electronically Signed   By: Marin Roberts M.D.   On: 03/07/2015 19:36   Dg Knee Complete 4 Views Right  03/07/2015   CLINICAL DATA:  Fever and chills  EXAM: RIGHT KNEE - COMPLETE 4+ VIEW  COMPARISON:  09/27/2012  FINDINGS: Considerable soft tissue swelling is noted surrounding the knee joint. A knee prosthesis is again identified and stable. Significant joint effusion is seen no loosening is identified at this time.  IMPRESSION: Soft tissue swelling and large joint effusion.  No acute bony abnormality is seen.   Electronically Signed   By: Alcide Clever M.D.   On: 03/07/2015 19:39    Objective  Filed Vitals:   03/08/15 0441 03/08/15 1310 03/08/15 2101 03/09/15 0518  BP: 106/52 92/62 124/74 109/53  Pulse: 61 60 67 78  Temp: 97.7 F (36.5 C) 98 F (36.7 C) 98.6 F (37 C) 98.5 F (36.9 C)  TempSrc: Oral Oral Oral Oral  Resp: 20 24 22 20   Height:      Weight:      SpO2: 97% 95% 100% 95%    Intake/Output Summary (Last 24 hours) at 03/09/15 1116 Last data filed at 03/08/15 2101  Gross per 24 hour  Intake   1000 ml  Output      0 ml  Net   1000 ml   Filed Weights   03/08/15 0003  Weight: 108.001 kg (238 lb 1.6 oz)    Exam:  GENERAL: NAD  HEENT: head NCAT, no scleral icterus  LUNGS: Clear to auscultation. No wheezing or crackles  HEART: Regular rate and rhythm without murmur. 2+ pulses, no JVD, no peripheral edema  ABDOMEN: Soft, nontender, and nondistended.  EXTREMITIES: right knee mildly swollen and erythematous, tender, cellulitis receeding based on margins drawn on admission   NEUROLOGIC: Alert and oriented x3.    Data Reviewed: Basic Metabolic Panel:  Recent Labs Lab 03/07/15 1852 03/08/15 0422 03/09/15 0424  NA 138 138 140  K 4.3 4.1 3.8  CL 104 105 109  CO2 GLUCOSE 109*  109* 114*  BUN CREATININE 0.70 0.91 0.70  CALCIUM 9.2 8.7* 8.4*   Liver Function Tests:  Recent Labs Lab 03/07/15 1852 03/08/15 0422 03/09/15 0424  AST 21 18 40  ALT 16 13* 43  ALKPHOS 76 63 75  BILITOT 1.0 1.5* 1.2  PROT 7.8 6.7 6.2*  ALBUMIN 3.6 3.1* 2.6*   CBC:  Recent Labs Lab 03/07/15 1852 03/08/15 0422 03/09/15 0424  WBC 15.8* 15.3* 9.7  NEUTROABS 13.1* 11.6* 6.9  HGB 13.6 12.7 11.3*  HCT 41.6 39.2 35.6*  MCV 90.0 90.7 89.0  PLT 282 274 229    Recent Results (from the past 240 hour(s))  Body fluid culture     Status: None (Preliminary result)   Collection Time: 03/07/15 10:40 PM  Result Value Ref Range Status   Specimen Description SYNOVIAL RIGHT KNEE  Final   Special Requests NONE  Final   Gram Stain   Final    WBC PRESENT,BOTH PMN AND MONONUCLEAR CYTOSPIN SMEAR NO ORGANISMS SEEN RESULT CALLED TO, READ BACK BY AND VERIFIED WITH: Sondra Barges RN @ 0000 ON 03/08/15 BY C DAVIS   Culture   Final    FEW PSEUDOMONAS AERUGINOSA CRITICAL RESULT CALLED TO, READ BACK BY AND VERIFIED WITH: C MILLAR,RN AT 1102 03/09/15 BY L BENFIELD Performed at [facility]    Report Status PENDING  Incomplete     Scheduled Meds: . aspirin EC  81 mg Oral QHS  . enoxaparin (LOVENOX) injection  50 mg Subcutaneous Q24H  . escitalopram  10 mg Oral QHS  . gabapentin  200 mg Oral BID  . levothyroxine  125 mcg Oral QAC breakfast  . metoprolol succinate  25 mg Oral Q lunch  . pantoprazole  40 mg Oral Q lunch  . piperacillin-tazobactam (ZOSYN)  IV  3.375 g Intravenous Q8H  . vancomycin  1,000 mg Intravenous Q12H   Continuous Infusions: . sodium chloride 75 mL/hr at 03/09/15 0431     Pamella Pert, MD [facility] Pager [phone]. If 7 PM - 7 AM, please contact night-coverage at [email or web address], password [email or web address] 03/09/2015, 11:16 AM  LOS: 2 days

## 2015-03-10 ENCOUNTER — Encounter (HOSPITAL_COMMUNITY)
Admission: EM | Disposition: A | Discharge status: Home Health | Payer: Self-pay | Source: Home / Self Care | Attending: Internal Medicine

## 2015-03-10 ENCOUNTER — Inpatient Hospital Stay (HOSPITAL_COMMUNITY): Payer: Medicare Other | Admitting: Certified Registered"

## 2015-03-10 ENCOUNTER — Encounter (HOSPITAL_COMMUNITY): Payer: Self-pay | Admitting: Certified Registered"

## 2015-03-10 DIAGNOSIS — E785 Hyperlipidemia, unspecified: Secondary | ICD-10-CM

## 2015-03-10 DIAGNOSIS — I10 Essential (primary) hypertension: Secondary | ICD-10-CM

## 2015-03-10 DIAGNOSIS — L03115 Cellulitis of right lower limb: Secondary | ICD-10-CM

## 2015-03-10 HISTORY — PX: IRRIGATION AND DEBRIDEMENT KNEE: SHX5185

## 2015-03-10 LAB — SURGICAL PCR SCREEN
MRSA, PCR: NEGATIVE
STAPHYLOCOCCUS AUREUS: NEGATIVE

## 2015-03-10 LAB — VANCOMYCIN, TROUGH: VANCOMYCIN TR: 11 ug/mL (ref 10.0–20.0)

## 2015-03-10 SURGERY — IRRIGATION AND DEBRIDEMENT KNEE
Anesthesia: General | Site: Knee | Laterality: Right

## 2015-03-10 MED ORDER — SODIUM CHLORIDE 0.9 % IR SOLN
Status: DC | PRN
  Administered 2015-03-10: 9000 mL

## 2015-03-10 MED ORDER — HYDROMORPHONE HCL 1 MG/ML IJ SOLN
INTRAMUSCULAR | Status: AC
  Filled 2015-03-10: qty 1

## 2015-03-10 MED ORDER — SODIUM CHLORIDE 0.9 % IR SOLN
Status: AC
  Filled 2015-03-10: qty 1

## 2015-03-10 MED ORDER — ATROPINE SULFATE 0.4 MG/ML IJ SOLN
INTRAMUSCULAR | Status: DC | PRN
  Administered 2015-03-10: 0.2 mg via INTRAVENOUS

## 2015-03-10 MED ORDER — PROPOFOL 10 MG/ML IV BOLUS
INTRAVENOUS | Status: AC
  Filled 2015-03-10: qty 20

## 2015-03-10 MED ORDER — GLYCOPYRROLATE 0.2 MG/ML IJ SOLN
INTRAMUSCULAR | Status: DC | PRN
  Administered 2015-03-10: .2 mg via INTRAVENOUS

## 2015-03-10 MED ORDER — LACTATED RINGERS IV SOLN: INTRAVENOUS | Status: DC

## 2015-03-10 MED ORDER — ONDANSETRON HCL 4 MG/2ML IJ SOLN: 4.0000 mg | Freq: Four times a day (QID) | INTRAMUSCULAR | Status: DC | PRN

## 2015-03-10 MED ORDER — FLEET ENEMA 7-19 GM/118ML RE ENEM: 1.0000 | ENEMA | Freq: Once | RECTAL | Status: DC | PRN

## 2015-03-10 MED ORDER — PIPERACILLIN-TAZOBACTAM 3.375 G IVPB
INTRAVENOUS | Status: AC
  Filled 2015-03-10: qty 50

## 2015-03-10 MED ORDER — BISACODYL 10 MG RE SUPP: 10.0000 mg | Freq: Every day | RECTAL | Status: DC | PRN

## 2015-03-10 MED ORDER — METHOCARBAMOL 500 MG PO TABS
500.0000 mg | ORAL_TABLET | Freq: Four times a day (QID) | ORAL | Status: DC | PRN
  Administered 2015-03-10 – 2015-03-12 (×3): 500 mg via ORAL
  Filled 2015-03-10 (×3): qty 1

## 2015-03-10 MED ORDER — HYDROMORPHONE HCL 1 MG/ML IJ SOLN
0.5000 mg | INTRAMUSCULAR | Status: DC | PRN
  Administered 2015-03-10 – 2015-03-12 (×6): 1 mg via INTRAVENOUS
  Filled 2015-03-10 (×6): qty 1

## 2015-03-10 MED ORDER — FENTANYL CITRATE (PF) 100 MCG/2ML IJ SOLN
INTRAMUSCULAR | Status: AC
  Filled 2015-03-10: qty 4

## 2015-03-10 MED ORDER — ACETAMINOPHEN 650 MG RE SUPP: 650.0000 mg | Freq: Four times a day (QID) | RECTAL | Status: DC | PRN

## 2015-03-10 MED ORDER — LIDOCAINE HCL (CARDIAC) 20 MG/ML IV SOLN
INTRAVENOUS | Status: AC
  Filled 2015-03-10: qty 5

## 2015-03-10 MED ORDER — BUPIVACAINE HCL (PF) 0.25 % IJ SOLN
INTRAMUSCULAR | Status: AC
  Filled 2015-03-10: qty 30

## 2015-03-10 MED ORDER — ONDANSETRON HCL 4 MG PO TABS: 4.0000 mg | ORAL_TABLET | Freq: Four times a day (QID) | ORAL | Status: DC | PRN

## 2015-03-10 MED ORDER — BUPIVACAINE HCL (PF) 0.5 % IJ SOLN
INTRAMUSCULAR | Status: AC
  Filled 2015-03-10: qty 60

## 2015-03-10 MED ORDER — DEXAMETHASONE SODIUM PHOSPHATE 10 MG/ML IJ SOLN
INTRAMUSCULAR | Status: AC
  Filled 2015-03-10: qty 1

## 2015-03-10 MED ORDER — HYDROMORPHONE HCL 2 MG PO TABS
2.0000 mg | ORAL_TABLET | ORAL | Status: DC | PRN
  Administered 2015-03-11 (×2): 2 mg via ORAL
  Administered 2015-03-12: 4 mg via ORAL
  Filled 2015-03-10: qty 2
  Filled 2015-03-10 (×2): qty 1

## 2015-03-10 MED ORDER — VANCOMYCIN HCL 10 G IV SOLR
1250.0000 mg | Freq: Two times a day (BID) | INTRAVENOUS | Status: DC
  Administered 2015-03-10 – 2015-03-11 (×3): 1250 mg via INTRAVENOUS
  Filled 2015-03-10 (×3): qty 1250

## 2015-03-10 MED ORDER — ONDANSETRON HCL 4 MG/2ML IJ SOLN
INTRAMUSCULAR | Status: AC
  Filled 2015-03-10: qty 2

## 2015-03-10 MED ORDER — METOCLOPRAMIDE HCL 5 MG/ML IJ SOLN: 5.0000 mg | Freq: Three times a day (TID) | INTRAMUSCULAR | Status: DC | PRN

## 2015-03-10 MED ORDER — PROPOFOL 10 MG/ML IV BOLUS
INTRAVENOUS | Status: DC | PRN
  Administered 2015-03-10: 200 mg via INTRAVENOUS

## 2015-03-10 MED ORDER — ACETAMINOPHEN 325 MG PO TABS: 650.0000 mg | ORAL_TABLET | Freq: Four times a day (QID) | ORAL | Status: DC | PRN

## 2015-03-10 MED ORDER — FENTANYL CITRATE (PF) 250 MCG/5ML IJ SOLN
INTRAMUSCULAR | Status: AC
  Filled 2015-03-10: qty 25

## 2015-03-10 MED ORDER — ONDANSETRON HCL 4 MG/2ML IJ SOLN
INTRAMUSCULAR | Status: DC | PRN
  Administered 2015-03-10: 4 mg via INTRAVENOUS

## 2015-03-10 MED ORDER — DEXTROSE 5 % IV SOLN
500.0000 mg | Freq: Four times a day (QID) | INTRAVENOUS | Status: DC | PRN
  Filled 2015-03-10: qty 5

## 2015-03-10 MED ORDER — MIDAZOLAM HCL 2 MG/2ML IJ SOLN
INTRAMUSCULAR | Status: AC
  Filled 2015-03-10: qty 4

## 2015-03-10 MED ORDER — ENOXAPARIN SODIUM 40 MG/0.4ML ~~LOC~~ SOLN
40.0000 mg | SUBCUTANEOUS | Status: DC
  Administered 2015-03-11 – 2015-03-12 (×2): 40 mg via SUBCUTANEOUS
  Filled 2015-03-10 (×3): qty 0.4

## 2015-03-10 MED ORDER — HYDROMORPHONE HCL 1 MG/ML IJ SOLN
0.2500 mg | INTRAMUSCULAR | Status: DC | PRN
  Administered 2015-03-10 (×3): 0.5 mg via INTRAVENOUS
  Administered 2015-03-10 (×2): 0.25 mg via INTRAVENOUS

## 2015-03-10 MED ORDER — FENTANYL CITRATE (PF) 100 MCG/2ML IJ SOLN
INTRAMUSCULAR | Status: DC | PRN
  Administered 2015-03-10 (×7): 50 ug via INTRAVENOUS

## 2015-03-10 MED ORDER — MIDAZOLAM HCL 5 MG/5ML IJ SOLN
INTRAMUSCULAR | Status: DC | PRN
  Administered 2015-03-10 (×2): 1 mg via INTRAVENOUS

## 2015-03-10 MED ORDER — METOCLOPRAMIDE HCL 10 MG PO TABS: 5.0000 mg | ORAL_TABLET | Freq: Three times a day (TID) | ORAL | Status: DC | PRN

## 2015-03-10 MED ORDER — POLYETHYLENE GLYCOL 3350 17 G PO PACK: 17.0000 g | PACK | Freq: Every day | ORAL | Status: DC | PRN

## 2015-03-10 MED ORDER — LIDOCAINE HCL (CARDIAC) 20 MG/ML IV SOLN
INTRAVENOUS | Status: DC | PRN
  Administered 2015-03-10: 100 mg via INTRAVENOUS

## 2015-03-10 MED ORDER — BUPIVACAINE HCL (PF) 0.5 % IJ SOLN
INTRAMUSCULAR | Status: AC
  Filled 2015-03-10: qty 30

## 2015-03-10 MED ORDER — LACTATED RINGERS IV SOLN
INTRAVENOUS | Status: DC | PRN
  Administered 2015-03-10 (×2): via INTRAVENOUS

## 2015-03-10 SURGICAL SUPPLY — 44 items
BAG SPEC THK2 15X12 ZIP CLS (MISCELLANEOUS)
BAG ZIPLOCK 12X15 (MISCELLANEOUS) ×1 IMPLANT
BANDAGE ELASTIC 6 VELCRO ST LF (GAUZE/BANDAGES/DRESSINGS) ×2 IMPLANT
BANDAGE ESMARK 6X9 LF (GAUZE/BANDAGES/DRESSINGS) ×1 IMPLANT
BNDG CMPR 9X6 STRL LF SNTH (GAUZE/BANDAGES/DRESSINGS) ×1
BNDG ESMARK 6X9 LF (GAUZE/BANDAGES/DRESSINGS) ×2
CUFF TOURN SGL QUICK 34 (TOURNIQUET CUFF) ×2
CUFF TRNQT CYL 34X4X40X1 (TOURNIQUET CUFF) ×1 IMPLANT
DRAPE EXTREMITY T 121X128X90 (DRAPE) ×2 IMPLANT
DRAPE POUCH INSTRU U-SHP 10X18 (DRAPES) ×2 IMPLANT
DRAPE U-SHAPE 47X51 STRL (DRAPES) ×2 IMPLANT
DRSG ADAPTIC 3X8 NADH LF (GAUZE/BANDAGES/DRESSINGS) ×2 IMPLANT
DRSG PAD ABDOMINAL 8X10 ST (GAUZE/BANDAGES/DRESSINGS) ×2 IMPLANT
DURAPREP 26ML APPLICATOR (WOUND CARE) ×2 IMPLANT
ELECT REM PT RETURN 9FT ADLT (ELECTROSURGICAL) ×2
ELECTRODE REM PT RTRN 9FT ADLT (ELECTROSURGICAL) ×1 IMPLANT
EVACUATOR 1/8 PVC DRAIN (DRAIN) ×2 IMPLANT
GAUZE SPONGE 4X4 12PLY STRL (GAUZE/BANDAGES/DRESSINGS) ×2 IMPLANT
GLOVE BIO SURGEON STRL SZ7.5 (GLOVE) ×1 IMPLANT
GLOVE BIO SURGEON STRL SZ8 (GLOVE) ×3 IMPLANT
GLOVE BIOGEL PI IND STRL 8 (GLOVE) ×2 IMPLANT
GLOVE BIOGEL PI INDICATOR 8 (GLOVE) ×1
GLOVE ECLIPSE 8.0 STRL XLNG CF (GLOVE) IMPLANT
GOWN STRL REUS W/TWL LRG LVL3 (GOWN DISPOSABLE) ×2 IMPLANT
GOWN STRL REUS W/TWL XL LVL3 (GOWN DISPOSABLE) ×2 IMPLANT
HANDPIECE INTERPULSE COAX TIP (DISPOSABLE) ×2
IMMOBILIZER KNEE 20 (SOFTGOODS) ×2
IMMOBILIZER KNEE 20 THIGH 36 (SOFTGOODS) IMPLANT
KIT BASIN OR (CUSTOM PROCEDURE TRAY) ×2 IMPLANT
MANIFOLD NEPTUNE II (INSTRUMENTS) ×2 IMPLANT
PACK TOTAL JOINT (CUSTOM PROCEDURE TRAY) ×2 IMPLANT
PADDING CAST COTTON 6X4 STRL (CAST SUPPLIES) ×3 IMPLANT
POSITIONER SURGICAL ARM (MISCELLANEOUS) ×2 IMPLANT
SET HNDPC FAN SPRY TIP SCT (DISPOSABLE) ×1 IMPLANT
STAPLER VISISTAT 35W (STAPLE) ×2 IMPLANT
STRIP CLOSURE SKIN 1/2X4 (GAUZE/BANDAGES/DRESSINGS) ×2 IMPLANT
SUT MNCRL AB 4-0 PS2 18 (SUTURE) ×1 IMPLANT
SUT VIC AB 2-0 CT1 27 (SUTURE) ×2
SUT VIC AB 2-0 CT1 TAPERPNT 27 (SUTURE) ×3 IMPLANT
SUT VLOC 180 0 24IN GS25 (SUTURE) ×2 IMPLANT
SWAB COLLECTION DEVICE MRSA (MISCELLANEOUS) ×2 IMPLANT
TOWEL OR 17X26 10 PK STRL BLUE (TOWEL DISPOSABLE) ×3 IMPLANT
TUBE ANAEROBIC SPECIMEN COL (MISCELLANEOUS) ×2 IMPLANT
WRAP KNEE MAXI GEL POST OP (GAUZE/BANDAGES/DRESSINGS) ×1 IMPLANT

## 2015-03-10 NOTE — Anesthesia Procedure Notes (Addendum)
Procedure Name: LMA Insertion Date/Time: 03/10/2015 8:26 PM Performed by: Anastasio Champion E Pre-anesthesia Checklist: Patient identified, Emergency Drugs available, Suction available and Patient being monitored Patient Re-evaluated:Patient Re-evaluated prior to inductionOxygen Delivery Method: Circle System Utilized Preoxygenation: Pre-oxygenation with 100% oxygen Intubation Type: IV induction Ventilation: Mask ventilation without difficulty LMA: LMA inserted LMA Size: 4.0 Laryngoscope size: gastric tube down port of LMA. Tube type: Oral Number of attempts: 1 Placement Confirmation: positive ETCO2 Tube secured with: Tape Dental Injury: Teeth and Oropharynx as per pre-operative assessment

## 2015-03-10 NOTE — Progress Notes (Signed)
Subjective: Hospital day - 3 Patient reports pain as moderate and severe pain with her back today.  Patient seen in rounds with Dr. Lequita Halt. Patient is having problems with pain in the back, requiring pain medications. Dr. Lequita Halt discussed the findings of the previous aspirate which was positive. Recommend to perform a formal washout later today. Plan for formal I&D of the right knee.  Objective: Vital signs in last 24 hours: Temp:  [97.8 F (36.6 C)-98 F (36.7 C)] 97.9 F (36.6 C) (08/16 0534) Pulse Rate:  [69-73] 69 (08/16 0534) Resp:  [18] 18 (08/16 0534) BP: (112-121)/(55-67) 121/55 mmHg (08/16 0534) SpO2:  [96 %-99 %] 99 % (08/16 0534)  Intake/Output from previous day:  Intake/Output Summary (Last 24 hours) at 03/10/15 0909 Last data filed at 03/09/15 1400  Gross per 24 hour  Intake   2805 ml  Output      0 ml  Net   2805 ml    Labs:  Recent Labs  03/07/15 1852 03/08/15 0422 03/09/15 0424  HGB 13.6 12.7 11.3*    Recent Labs  03/08/15 0422 03/09/15 0424  WBC 15.3* 9.7  RBC 4.32 4.00  HCT 39.2 35.6*  PLT 274 229    Recent Labs  03/08/15 0422 03/09/15 0424  NA 138 140  K 4.1 3.8  CL 105 109  CO2 28 25  BUN 16 10  CREATININE 0.91 0.70  GLUCOSE 109* 114*  CALCIUM 8.7* 8.4*   No results for input(s): LABPT, INR in the last 72 hours.  EXAM General - Patient is Alert, Appropriate and Oriented Extremity - Neurovascular intact Sensation intact distally Knee - no drainage. Sub-Q fluid noted today on exam.   Motor Function - intact, moving foot and toes well on exam.   Past Medical History  Diagnosis Date  . Hyperlipidemia   . Status post primary angioplasty with coronary stent     DRUG-ELUTING STENT X1  TO PROXIMAL LAD  . Chronic back pain   . Chronic neck pain   . Arthritis     HANDS, KNEES  . DDD (degenerative disc disease), cervical   . DDD (degenerative disc disease), lumbar   . Radicular pain in left arm OCCASIONAL PAIN SHOOTING  DOWN LEFT ARM SECAONDARY TO  CERVICAL DEGENERATION  . Hemorrhoid   . Diverticulitis of colon   . B12 deficiency anemia   . Vitamin D deficiency   . Hypothyroidism   . Synovitis of knee HYPERTROPHIC RIGHT KNEE  . Abnormal stress test 01-22-2009    LOW RISK ADENOSINE NUCLEAR STUDY W/ PROBABLE MILD APICAL THINNING BUT NO ISCHEMIA  . Depression     states well controlled  . GERD (gastroesophageal reflux disease)   . Inguinal hernia, right     with ventral hernia  . Hypertension       PCP Dr Lysbeth Galas    . Hemorrhoids   . Coronary artery disease 2005    STRESS TEST /, NOte Dr Kirtland Bouchard EPIC/ no cardiologist now--STATES HEART STENT PLACED 2010  . Sleep apnea     couldnt tolerate machine- states last sleep study " many years ago"  . Bronchitis     hx of   . H/O hiatal hernia     Assessment/Plan: Hospital day - 3 Principal Problem:   Cellulitis of right knee Active Problems:   Hypothyroidism   Hyperlipidemia   Essential hypertension   Coronary atherosclerosis   GASTROESOPHAGEAL REFLUX DISEASE  Estimated body mass index is 40.25 kg/(m^2) as calculated from  the following:   Height as of this encounter: 5' 4.5" (1.638 m).   Weight as of this encounter: 108.001 kg (238 lb 1.6 oz).  Plan - Surgery later today. NPO HOLD Lovenox Consent  [name], PA-C Orthopaedic Surgery 03/10/2015, 9:09 AM

## 2015-03-10 NOTE — Evaluation (Signed)
Physical Therapy Evaluation Patient Details Name: Andrea Norton MRN: [id number] DOB: Jun 16, 1946 Today's Date: 03/10/2015   History of Present Illness  69 y.o. female with a past medical history of multiple bilateral knee surgeries (R TKA resection 2014, revision2015), hypertension, hypothyroidism, hyperlipidemia, coronary artery disease, depression, GERD who comes to the ER with complaints of fever, chills, fatigue associated with right knee tenderness.  Dx of cellulitis, septic arthritis.   Clinical Impression  Pt admitted with above diagnosis. Pt currently with functional limitations due to the deficits listed below (see PT Problem List). Pt ambulated 87' with RW, distance limited by R knee and back pain. Pt has significant weakness in R hip flexors and knee extensors. Pt will benefit from skilled PT to increase their independence and safety with mobility to allow discharge to the venue listed below.       Follow Up Recommendations Home health PT    Equipment Recommendations  None recommended by PT    Recommendations for Other Services       Precautions / Restrictions Precautions Precautions: None Precaution Comments: pt denies h/o falls Restrictions Weight Bearing Restrictions: No      Mobility  Bed Mobility Overal bed mobility: Modified Independent             General bed mobility comments: pt lifts RLE with UEs  Transfers Overall transfer level: Modified independent Equipment used: Rolling walker (2 wheeled)                Ambulation/Gait Ambulation/Gait assistance: Supervision Ambulation Distance (Feet): 80 Feet Assistive device: Rolling walker (2 wheeled) Gait Pattern/deviations: Step-to pattern;Trunk flexed   Gait velocity interpretation: Below normal speed for age/gender General Gait Details: steady with RW, no LOB, trunk flexed (pt stated this is due to back pain, she's due for a shot in her back soon)  Stairs            Wheelchair  Mobility    Modified Rankin (Stroke Patients Only)       Balance Overall balance assessment: Modified Independent                                           Pertinent Vitals/Pain Pain Assessment: 0-10 Pain Score: 8  Pain Location: back and R knee-with activity Pain Intervention(s): Monitored during session;Premedicated before session;Patient requesting pain meds-RN notified;Ice applied;Limited activity within patient's tolerance;Repositioned    Home Living Family/patient expects to be discharged to:: Private residence Living Arrangements: Other relatives;Spouse/significant other Available Help at Discharge: Family;Available 24 hours/day Type of Home: Mobile home Home Access: Stairs to enter Entrance Stairs-Rails: Right;Left;Can reach both Entrance Stairs-Number of Steps: 4 Home Layout: One level;Able to live on main level with bedroom/bathroom Home Equipment: Dan Humphreys - 2 wheels;Cane - single point;Walker - 4 wheels;Bedside commode;Shower seat      Prior Function Level of Independence: Independent with assistive device(s)         Comments: independent bathing, husband helps dress lower body     Hand Dominance        Extremity/Trunk Assessment   Upper Extremity Assessment: Overall WFL for tasks assessed           Lower Extremity Assessment: RLE deficits/detail RLE Deficits / Details: R hip flexion 2/5, knee extension -15* AROM, flexion 95* AROM, knee ext +3/5, ankle WNL, sensation intact to light touch    Cervical / Trunk Assessment: Kyphotic  Communication  Communication: No difficulties  Cognition Arousal/Alertness: Awake/alert Behavior During Therapy: WFL for tasks assessed/performed Overall Cognitive Status: Within Functional Limits for tasks assessed                      General Comments      Exercises        Assessment/Plan    PT Assessment Patient needs continued PT services  PT Diagnosis Difficulty  walking;Acute pain   PT Problem List Decreased activity tolerance;Decreased range of motion;Decreased strength;Decreased mobility;Pain;Obesity  PT Treatment Interventions Gait training;Functional mobility training;Therapeutic activities;Patient/family education;Stair training;Therapeutic exercise   PT Goals (Current goals can be found in the Care Plan section) Acute Rehab PT Goals Patient Stated Goal: to sing in choir at church PT Goal Formulation: With patient Time For Goal Achievement: 03/24/15 Potential to Achieve Goals: Good    Frequency Min 5X/week   Barriers to discharge        Co-evaluation               End of Session Equipment Utilized During Treatment: Gait belt Activity Tolerance: Patient limited by pain Patient left: in chair;with call bell/phone within reach           Time: 0946-1013 PT Time Calculation (min) (ACUTE ONLY): 27 min   Charges:   PT Evaluation $Initial PT Evaluation Tier I: 1 Procedure PT Treatments $Gait Training: 8-22 mins   PT G Codes:        Tamala Ser 03/10/2015, 10:20 AM [phone]

## 2015-03-10 NOTE — Progress Notes (Signed)
ANTIBIOTIC CONSULT NOTE   Pharmacy Consult for vancomycin Indication: cellulitis, possible septic arthritis  No Known Allergies  Patient Measurements: Height: 5' 4.5" (163.8 cm) Weight: 238 lb 1.6 oz (108.001 kg) IBW/kg (Calculated) : 55.85 Adjusted Body Weight:   Vital Signs: Temp: 97.9 F (36.6 C) (08/16 0534) Temp Source: Oral (08/16 0534) BP: 121/55 mmHg (08/16 0534) Pulse Rate: 69 (08/16 0534) Intake/Output from previous day: 08/15 0701 - 08/16 0700 In: 2805 [P.O.:480; I.V.:1775; IV Piggyback:550] Out: -  Intake/Output from this shift:    Labs:  Recent Labs  03/07/15 1852 03/08/15 0422 03/09/15 0424  WBC 15.8* 15.3* 9.7  HGB 13.6 12.7 11.3*  PLT 282 274 229  CREATININE 0.70 0.91 0.70   Estimated Creatinine Clearance: 82.6 mL/min (by C-G formula based on Cr of 0.7). No results for input(s): VANCOTROUGH, VANCOPEAK, VANCORANDOM, GENTTROUGH, GENTPEAK, GENTRANDOM, TOBRATROUGH, TOBRAPEAK, TOBRARND, AMIKACINPEAK, AMIKACINTROU, AMIKACIN in the last 72 hours.   Microbiology: Recent Results (from the past 720 hour(s))  Blood culture (routine x 2)     Status: None (Preliminary result)   Collection Time: 03/07/15  6:50 PM  Result Value Ref Range Status   Specimen Description BLOOD RIGHT ANTECUBITAL  Final   Special Requests BOTTLES DRAWN AEROBIC AND ANAEROBIC  Final   Culture   Final    NO GROWTH 1 DAY Performed at [facility]    Report Status PENDING  Incomplete  Blood culture (routine x 2)     Status: None (Preliminary result)   Collection Time: 03/07/15  8:05 PM  Result Value Ref Range Status   Specimen Description BLOOD LEFT HAND  Final   Special Requests BOTTLES DRAWN AEROBIC ONLY  Final   Culture   Final    NO GROWTH 1 DAY Performed at [facility]    Report Status PENDING  Incomplete  Anaerobic culture     Status: None (Preliminary result)   Collection Time: 03/07/15 10:40 PM  Result Value Ref Range Status   Specimen Description  SYNOVIAL RIGHT KNEE  Final   Special Requests NONE  Final   Gram Stain   Final    CYTOSPIN SMEAR WBC PRESENT,BOTH PMN AND MONONUCLEAR NO ORGANISMS SEEN    Culture   Final    NO ANAEROBES ISOLATED; CULTURE IN PROGRESS FOR 5 DAYS Performed at [facility]    Report Status PENDING  Incomplete  Body fluid culture     Status: None (Preliminary result)   Collection Time: 03/07/15 10:40 PM  Result Value Ref Range Status   Specimen Description SYNOVIAL RIGHT KNEE  Final   Special Requests NONE  Final   Gram Stain   Final    WBC PRESENT,BOTH PMN AND MONONUCLEAR CYTOSPIN SMEAR NO ORGANISMS SEEN RESULT CALLED TO, READ BACK BY AND VERIFIED WITH: Sondra Barges RN @ 0000 ON 03/08/15 BY C DAVIS   Culture   Final    FEW PSEUDOMONAS AERUGINOSA CRITICAL RESULT CALLED TO, READ BACK BY AND VERIFIED WITH: C MILLAR,RN AT 1102 03/09/15 BY L BENFIELD    Report Status PENDING  Incomplete   Organism ID, Bacteria PSEUDOMONAS AERUGINOSA  Final      Susceptibility   Pseudomonas aeruginosa - MIC*    CEFTAZIDIME <=1 SENSITIVE Sensitive     CIPROFLOXACIN <=0.25 SENSITIVE Sensitive     GENTAMICIN <=1 SENSITIVE Sensitive     IMIPENEM 2 SENSITIVE Sensitive     PIP/TAZO <=4 SENSITIVE Sensitive     CEFEPIME Value in next row Sensitive      <=  1 SENSITIVEPerformed at [facility]    * FEW PSEUDOMONAS AERUGINOSA    Medical History: Past Medical History  Diagnosis Date  . Hyperlipidemia   . Status post primary angioplasty with coronary stent     DRUG-ELUTING STENT X1  TO PROXIMAL LAD  . Chronic back pain   . Chronic neck pain   . Arthritis     HANDS, KNEES  . DDD (degenerative disc disease), cervical   . DDD (degenerative disc disease), lumbar   . Radicular pain in left arm OCCASIONAL PAIN SHOOTING DOWN LEFT ARM SECAONDARY TO  CERVICAL DEGENERATION  . Hemorrhoid   . Diverticulitis of colon   . B12 deficiency anemia   . Vitamin D deficiency   . Hypothyroidism   . Synovitis of knee HYPERTROPHIC  RIGHT KNEE  . Abnormal stress test 01-22-2009    LOW RISK ADENOSINE NUCLEAR STUDY W/ PROBABLE MILD APICAL THINNING BUT NO ISCHEMIA  . Depression     states well controlled  . GERD (gastroesophageal reflux disease)   . Inguinal hernia, right     with ventral hernia  . Hypertension       PCP Dr Lysbeth Galas    . Hemorrhoids   . Coronary artery disease 2005    STRESS TEST /, NOte Dr Kirtland Bouchard EPIC/ no cardiologist now--STATES HEART STENT PLACED 2010  . Sleep apnea     couldnt tolerate machine- states last sleep study " many years ago"  . Bronchitis     hx of   . H/O hiatal hernia    Assessment: 27 YOM w/ multiple BL knee surgeries with cellulitis including possible periprosthetic joint infection. Knee aspiration performed.  Vancomycin and zosyn started per [facility], received permission for pharmacy to dose and monitor vancomycin  8/14 >> zosyn(MD) >>  8/14 >> vancomycin >>   8/13 blood:NGTD 8/13 synovial fluid: P. Aeruginosa (Pan-susc) 8/16 C.  Diff: sent   Renal: SCr is WNL WBC improved to WNL afebrile  Dose changes/levels: 8/16 0830 VT = 11.4 mcg/ml on vancomycin 1gm IV q12h prior to 6th dose   Goal of Therapy:  Vancomycin trough level 15-20 mcg/ml, for possible prosthetic joint involvement  Plan:  Day #3 vancomycin and pip/tazo  Vancomycin trough subtherapeutic, increase vancomycin to 1250mg  IV q12h for est new trough ~46mcg/ml  Zosyn extended infusion dosing remains appropriat  Plan for I&D today  Cultures discussed with [facility], plan to continue vancomycin through OR then likely stop.  Zosyn can be narrowed to ceftazidime or cefepime then possibly PO ciprofloxacin  Juliette Alcide, PharmD, BCPS.   Pager: [phone]  03/10/2015,9:49 AM

## 2015-03-10 NOTE — Transfer of Care (Signed)
Immediate Anesthesia Transfer of Care Note  Patient: Andrea Norton  Procedure(s) Performed: Procedure(s): IRRIGATION AND DEBRIDEMENT of right KNEE (Right)  Patient Location: PACU  Anesthesia Type:General  Level of Consciousness: awake, alert , oriented and patient cooperative  Airway & Oxygen Therapy: Patient Spontanous Breathing and Patient connected to face mask oxygen  Post-op Assessment: Report given to RN, Post -op Vital signs reviewed and stable and Patient moving all extremities X 4  Post vital signs: stable  Last Vitals:  Filed Vitals:   03/10/15 2130  BP: 109/58  Pulse: 80  Temp:   Resp: 10    Complications: No apparent anesthesia complications

## 2015-03-10 NOTE — Progress Notes (Signed)
[facility] PROGRESS NOTE  Andrea Norton YNW:[id number] DOB: 1946/05/19 DOA: 03/07/2015 PCP: Josue Hector, MD  Assessment/Plan: Right knee infection with cellulitis - orthopedic surgery following - Pseudomonas in knee culture - Pt planned for surgery today per Dr. Lequita Halt  Recent Diarrhea - Improved - C diff pending. Some soft stools noted per staff this AM, doubt this is cdiff  Hypotension  - shortly after IV dilaudid, decrease dose today and discontinue Lisinopril today  - Cont to monitor  Hypothyroidism - Continue levothyroxine.  Hyperlipidemia - Continue simvastatin.  Essential hypertension - Continue current therapy.  Code Status: Full Family Communication: Pt in room (indicate person spoken with, relationship, and if by phone, the number) Disposition Plan: Pending   Consultants:  Orthopedic Surgery  Procedures:    Antibiotics:  Zosyn 8/13 >>   Vancomycin 8/13 >>  HPI/Subjective: Eager to have surgery. Reports diarrhea has largely resolved. No other complaints.  Objective: Filed Vitals:   03/10/15 0534 03/10/15 1205 03/10/15 1340 03/10/15 1738  BP: 121/55 131/89 117/66 150/62  Pulse: 69 62 60   Temp: 97.9 F (36.6 C)  97.4 F (36.3 C) 97 F (36.1 C)  TempSrc: Oral  Oral Oral  Resp: Height:      Weight:      SpO2: 99%  98% 99%   No intake or output data in the 24 hours ending 03/10/15 1826 Filed Weights   03/08/15 0003  Weight: 108.001 kg (238 lb 1.6 oz)    Exam:   General:  Awake, in nad  Cardiovascular: regular, s1, s2  Respiratory: normal resp effort, no wheezing  Abdomen: soft,nondistended  Musculoskeletal: perfused, no clubbing   Data Reviewed: Basic Metabolic Panel:  Recent Labs Lab 03/07/15 1852 03/08/15 0422 03/09/15 0424  NA 138 138 140  K 4.3 4.1 3.8  CL 104 105 109  CO2 GLUCOSE 109* 109* 114*  BUN CREATININE 0.70 0.91 0.70  CALCIUM 9.2 8.7* 8.4*   Liver  Function Tests:  Recent Labs Lab 03/07/15 1852 03/08/15 0422 03/09/15 0424  AST 21 18 40  ALT 16 13* 43  ALKPHOS 76 63 75  BILITOT 1.0 1.5* 1.2  PROT 7.8 6.7 6.2*  ALBUMIN 3.6 3.1* 2.6*   No results for input(s): LIPASE, AMYLASE in the last 168 hours. No results for input(s): AMMONIA in the last 168 hours. CBC:  Recent Labs Lab 03/07/15 1852 03/08/15 0422 03/09/15 0424  WBC 15.8* 15.3* 9.7  NEUTROABS 13.1* 11.6* 6.9  HGB 13.6 12.7 11.3*  HCT 41.6 39.2 35.6*  MCV 90.0 90.7 89.0  PLT 282 274 229   Cardiac Enzymes: No results for input(s): CKTOTAL, CKMB, CKMBINDEX, TROPONINI in the last 168 hours. BNP (last 3 results) No results for input(s): BNP in the last 8760 hours.  ProBNP (last 3 results) No results for input(s): PROBNP in the last 8760 hours.  CBG: No results for input(s): GLUCAP in the last 168 hours.  Recent Results (from the past 240 hour(s))  Blood culture (routine x 2)     Status: None (Preliminary result)   Collection Time: 03/07/15  6:50 PM  Result Value Ref Range Status   Specimen Description BLOOD RIGHT ANTECUBITAL  Final   Special Requests BOTTLES DRAWN AEROBIC AND ANAEROBIC  Final   Culture   Final    NO GROWTH 2 DAYS Performed at [facility]    Report Status PENDING  Incomplete  Blood culture (routine  x 2)     Status: None (Preliminary result)   Collection Time: 03/07/15  8:05 PM  Result Value Ref Range Status   Specimen Description BLOOD LEFT HAND  Final   Special Requests BOTTLES DRAWN AEROBIC ONLY  Final   Culture   Final    NO GROWTH 2 DAYS Performed at [facility]    Report Status PENDING  Incomplete  Anaerobic culture     Status: None (Preliminary result)   Collection Time: 03/07/15 10:40 PM  Result Value Ref Range Status   Specimen Description SYNOVIAL RIGHT KNEE  Final   Special Requests NONE  Final   Gram Stain   Final    CYTOSPIN SMEAR WBC PRESENT,BOTH PMN AND MONONUCLEAR NO ORGANISMS SEEN     Culture   Final    NO ANAEROBES ISOLATED; CULTURE IN PROGRESS FOR 5 DAYS Performed at [facility]    Report Status PENDING  Incomplete  Body fluid culture     Status: None (Preliminary result)   Collection Time: 03/07/15 10:40 PM  Result Value Ref Range Status   Specimen Description SYNOVIAL RIGHT KNEE  Final   Special Requests NONE  Final   Gram Stain   Final    WBC PRESENT,BOTH PMN AND MONONUCLEAR CYTOSPIN SMEAR NO ORGANISMS SEEN RESULT CALLED TO, READ BACK BY AND VERIFIED WITH: Sondra Barges RN @ 0000 ON 03/08/15 BY C DAVIS   Culture   Final    FEW PSEUDOMONAS AERUGINOSA CRITICAL RESULT CALLED TO, READ BACK BY AND VERIFIED WITH: C MILLAR,RN AT 1102 03/09/15 BY L BENFIELD    Report Status PENDING  Incomplete   Organism ID, Bacteria PSEUDOMONAS AERUGINOSA  Final      Susceptibility   Pseudomonas aeruginosa - MIC*    CEFTAZIDIME <=1 SENSITIVE Sensitive     CIPROFLOXACIN <=0.25 SENSITIVE Sensitive     GENTAMICIN <=1 SENSITIVE Sensitive     IMIPENEM 2 SENSITIVE Sensitive     PIP/TAZO <=4 SENSITIVE Sensitive     CEFEPIME Value in next row Sensitive      <=1 SENSITIVEPerformed at [facility]    * FEW PSEUDOMONAS AERUGINOSA  Surgical pcr screen     Status: None   Collection Time: 03/10/15  8:57 AM  Result Value Ref Range Status   MRSA, PCR NEGATIVE NEGATIVE Final   Staphylococcus aureus NEGATIVE NEGATIVE Final    Comment:        The Xpert SA Assay (FDA approved for NASAL specimens in patients over 20 years of age), is one component of a comprehensive surveillance program.  Test performance has been validated by [facility] for patients greater than or equal to 74 year old. It is not intended to diagnose infection nor to guide or monitor treatment.      Studies: No results found.  Scheduled Meds: . escitalopram  10 mg Oral QHS  . gabapentin  200 mg Oral BID  . levothyroxine  125 mcg Oral QAC breakfast  . metoprolol succinate  25 mg Oral Q lunch  .  pantoprazole  40 mg Oral Q lunch  . piperacillin-tazobactam (ZOSYN)  IV  3.375 g Intravenous Q8H  . vancomycin  1,250 mg Intravenous Q12H   Continuous Infusions: . sodium chloride 75 mL/hr at 03/10/15 1205    Principal Problem:   Cellulitis of right knee Active Problems:   Hypothyroidism   Hyperlipidemia   Essential hypertension   Coronary atherosclerosis   GASTROESOPHAGEAL REFLUX DISEASE   Marwan Lipe K  [facility] Pager [phone]. If 7PM-7AM, please contact night-coverage at [email or web address], password [facility] 03/10/2015, 6:26 PM  LOS: 3 days

## 2015-03-10 NOTE — Anesthesia Preprocedure Evaluation (Addendum)
Anesthesia Evaluation  Patient identified by MRN, date of birth, ID band Patient awake    Reviewed: Allergy & Precautions, H&P , NPO status , Patient's Chart, lab work & pertinent test results  Airway Mallampati: III  TM Distance: <3 FB Neck ROM: Full    Dental  (+) Dental Advisory Given, Teeth Intact,    Pulmonary shortness of breath, sleep apnea , former smoker,  breath sounds clear to auscultation  Pulmonary exam normal       Cardiovascular hypertension, Pt. on medications and Pt. on home beta blockers + CAD and + Cardiac Stents Normal cardiovascular examRhythm:Regular Rate:Normal     Neuro/Psych negative neurological ROS  negative psych ROS   GI/Hepatic negative GI ROS, Neg liver ROS,   Endo/Other  Hypothyroidism Morbid obesity  Renal/GU negative Renal ROS  negative genitourinary   Musculoskeletal negative musculoskeletal ROS (+)   Abdominal   Peds negative pediatric ROS (+)  Hematology negative hematology ROS (+)   Anesthesia Other Findings   Reproductive/Obstetrics negative OB ROS                           Anesthesia Physical Anesthesia Plan  ASA: III  Anesthesia Plan: General   Post-op Pain Management:    Induction: Intravenous  Airway Management Planned: LMA  Additional Equipment:   Intra-op Plan:   Post-operative Plan:   Informed Consent:   Plan Discussed with: Surgeon  Anesthesia Plan Comments:         Anesthesia Quick Evaluation

## 2015-03-10 NOTE — Brief Op Note (Signed)
03/07/2015 - 03/10/2015  9:22 PM  PATIENT:  Andrea Norton  69 y.o. female  PRE-OPERATIVE DIAGNOSIS:  infected right knee   POST-OPERATIVE DIAGNOSIS:  infected right knee   PROCEDURE:  Procedure(s): IRRIGATION AND DEBRIDEMENT of right KNEE (Right)  SURGEON:  Surgeon(s) and Role:    * Ollen Gross, MD - Primary  PHYSICIAN ASSISTANT:   ASSISTANTS: none   ANESTHESIA:   general  EBL:  Total I/O In: -  Out: 50 [Blood:50]  BLOOD ADMINISTERED:none  DRAINS: (Medium) Hemovact drain(s) in the right knee with  Suction Open   LOCAL MEDICATIONS USED:  NONE  COUNTS:  YES  TOURNIQUET:  35 minutes @ 300 mm Hg  DICTATION: .Other Dictation: Dictation Number [id number]  PLAN OF CARE: Admit to inpatient   PATIENT DISPOSITION:  PACU - hemodynamically stable.

## 2015-03-10 NOTE — H&P (View-Only) (Signed)
Subjective: Patient states nee still hurts but redness over knee and leg is much better   Objective: Vital signs in last 24 hours: Temp:  [98 F (36.7 C)-98.6 F (37 C)] 98.5 F (36.9 C) (08/15 0518) Pulse Rate:  [60-78] 78 (08/15 0518) Resp:  [20-24] 20 (08/15 0518) BP: (92-124)/(53-74) 109/53 mmHg (08/15 0518) SpO2:  [95 %-100 %] 95 % (08/15 0518)  Intake/Output from previous day: 08/14 0701 - 08/15 0700 In: 1200 [P.O.:350; I.V.:600; IV Piggyback:250] Out: -  Intake/Output this shift:     Recent Labs  03/07/15 1852 03/08/15 0422 03/09/15 0424  HGB 13.6 12.7 11.3*    Recent Labs  03/08/15 0422 03/09/15 0424  WBC 15.3* 9.7  RBC 4.32 4.00  HCT 39.2 35.6*  PLT 274 229    Recent Labs  03/08/15 0422 03/09/15 0424  NA 138 140  K 4.1 3.8  CL 105 109  CO2 28 25  BUN 16 10  CREATININE 0.91 0.70  GLUCOSE 109* 114*  CALCIUM 8.7* 8.4*   No results for input(s): LABPT, INR in the last 72 hours.  Neurologically intact slight warmth right knee; moderate swelling; minimal cellulitis  Assessment/Plan: Right knee cellulitis/? Septic arthritis- WBC and erythema normalized. Continue IV antibiotics. Will follow to see if knee improves over next 48 hours to see if she needs surgical debridement   Justis Dupas V 03/09/2015, 7:17 AM      

## 2015-03-10 NOTE — Interval H&P Note (Signed)
History and Physical Interval Note:  03/10/2015 6:02 PM  Andrea Norton  has presented today for surgery, with the diagnosis of infected right knee   The various methods of treatment have been discussed with the patient and family. After consideration of risks, benefits and other options for treatment, the patient has consented to  Procedure(s): IRRIGATION AND DEBRIDEMENT KNEE (Right) as a surgical intervention .  The patient's history has been reviewed, patient examined, no change in status, stable for surgery.  I have reviewed the patient's chart and labs.  Questions were answered to the patient's satisfaction.     Loanne Drilling

## 2015-03-11 ENCOUNTER — Encounter (HOSPITAL_COMMUNITY): Payer: Self-pay | Admitting: Orthopedic Surgery

## 2015-03-11 MED ORDER — CIPROFLOXACIN HCL 750 MG PO TABS
750.0000 mg | ORAL_TABLET | Freq: Two times a day (BID) | ORAL | Status: DC
  Administered 2015-03-11 – 2015-03-12 (×2): 750 mg via ORAL
  Filled 2015-03-11 (×3): qty 1

## 2015-03-11 NOTE — Evaluation (Signed)
Physical Therapy Evaluation Patient Details Name: Andrea Norton MRN: [id number] DOB: Jun 28, 1946 Today's Date: 03/11/2015   History of Present Illness  69 y.o. female with a past medical history of multiple bilateral knee surgeries (R TKA resection 2014, revision2015), hypertension, hypothyroidism, hyperlipidemia, coronary artery disease, depression, GERD who comes to the ER with complaints of fever, chills, fatigue associated with right knee tenderness.  Dx of cellulitis, septic arthritis.  s/p I &D 03/10/15.    Clinical Impression  Pt admitted with above diagnosis. Pt currently with functional limitations due to the deficits listed below (see PT Problem List). Pt ambulated 110' with RW and min/guard assist, distance limited by pain. Assisted pt with RLE exercises.  Pt will benefit from skilled PT to increase their independence and safety with mobility to allow discharge to the venue listed below.       Follow Up Recommendations Home health PT    Equipment Recommendations  None recommended by PT    Recommendations for Other Services       Precautions / Restrictions Precautions Precautions: None Precaution Comments: pt denies h/o falls Restrictions Weight Bearing Restrictions: No      Mobility  Bed Mobility Overal bed mobility: Needs Assistance Bed Mobility: Supine to Sit     Supine to sit: Min assist     General bed mobility comments: min A for RLE   Transfers Overall transfer level: Needs assistance Equipment used: Rolling walker (2 wheeled) Transfers: Sit to/from Stand Sit to Stand: Min guard         General transfer comment: min/guard for safety  Ambulation/Gait Ambulation/Gait assistance: Min guard Ambulation Distance (Feet): 110 Feet Assistive device: Rolling walker (2 wheeled) Gait Pattern/deviations: Step-to pattern;Trunk flexed;Decreased stride length   Gait velocity interpretation: Below normal speed for age/gender General Gait Details: steady with  RW, no LOB, trunk flexed (pt stated this is due to back pain, she's due for a shot in her back soon)  Stairs            Wheelchair Mobility    Modified Rankin (Stroke Patients Only)       Balance Overall balance assessment: Modified Independent                                           Pertinent Vitals/Pain Pain Score: 10-Worst pain ever Pain Location: R knee and back with activity Pain Descriptors / Indicators: Other (Comment) (knee is stinging) Pain Intervention(s): Limited activity within patient's tolerance;Monitored during session;Premedicated before session;Ice applied    Home Living Family/patient expects to be discharged to:: Private residence Living Arrangements: Other relatives;Spouse/significant other Available Help at Discharge: Family;Available 24 hours/day Type of Home: Mobile home Home Access: Stairs to enter Entrance Stairs-Rails: Right;Left;Can reach both Entrance Stairs-Number of Steps: 4 Home Layout: One level;Able to live on main level with bedroom/bathroom Home Equipment: Dan Humphreys - 2 wheels;Cane - single point;Walker - 4 wheels;Bedside commode;Shower seat      Prior Function Level of Independence: Independent with assistive device(s)         Comments: independent bathing, husband helps dress lower body     Hand Dominance        Extremity/Trunk Assessment                 RLE Deficits / Details: R hip flexion -2/5, knee AAROM 0-50*, weak quad set, ankle WNL, sensation intact to light touch  Cervical / Trunk Assessment: Kyphotic  Communication   Communication: No difficulties  Cognition Arousal/Alertness: Awake/alert Behavior During Therapy: WFL for tasks assessed/performed Overall Cognitive Status: Within Functional Limits for tasks assessed                      General Comments      Exercises Total Joint Exercises Ankle Circles/Pumps: AROM;Both;10 reps Quad Sets: AROM;Both;Supine;10  reps Short Arc QuadBarbaraann Boys;Right;10 reps;Supine Heel Slides: AAROM;Right;10 reps;Supine Hip ABduction/ADduction: AAROM;Right;10 reps;Supine Straight Leg Raises: AAROM;Right;10 reps;Supine Goniometric ROM: 0-45 R knee AAROM      Assessment/Plan    PT Assessment Patient needs continued PT services  PT Diagnosis Difficulty walking;Acute pain   PT Problem List Decreased activity tolerance;Decreased range of motion;Decreased strength;Decreased mobility;Pain;Obesity  PT Treatment Interventions Gait training;Functional mobility training;Therapeutic activities;Patient/family education;Stair training;Therapeutic exercise   PT Goals (Current goals can be found in the Care Plan section) Acute Rehab PT Goals Patient Stated Goal: to sing in choir at church PT Goal Formulation: With patient Time For Goal Achievement: 03/24/15 Potential to Achieve Goals: Good    Frequency Min 5X/week   Barriers to discharge        Co-evaluation               End of Session Equipment Utilized During Treatment: Gait belt Activity Tolerance: Patient limited by pain Patient left: in chair;with call bell/phone within reach;with family/visitor present Nurse Communication: Mobility status         Time: 1610-9604 PT Time Calculation (min) (ACUTE ONLY): 28 min   Charges:   PT Evaluation $PT Re-evaluation: 1 Procedure PT Treatments $Gait Training: 8-22 mins   PT G Codes:        Tamala Ser 03/11/2015, 1:42 PM [phone]

## 2015-03-11 NOTE — Progress Notes (Signed)
Subjective: 1 Day Post-Op Procedure(s) (LRB): IRRIGATION AND DEBRIDEMENT of right KNEE (Right) Patient reports pain as mild.   Patient seen in rounds for Dr. Lequita Halt. Patient is well, and has had no acute complaints or problems. No issues overnight. No SOB or chest pain.  Plan is to go Home after hospital stay.  Objective: Vital signs in last 24 hours: Temp:  [97 F (36.1 C)-98.1 F (36.7 C)] 98.1 F (36.7 C) (08/17 0540) Pulse Rate:  [60-81] 62 (08/17 0540) Resp:  [10-21] 18 (08/17 0540) BP: (93-150)/(55-89) 93/63 mmHg (08/17 0540) SpO2:  [95 %-100 %] 96 % (08/17 0540)  Intake/Output from previous day:  Intake/Output Summary (Last 24 hours) at 03/11/15 1116 Last data filed at 03/11/15 0908  Gross per 24 hour  Intake   1550 ml  Output    750 ml  Net    800 ml    Intake/Output this shift: Total I/O In: -  Out: 300 [Urine:300]  Labs:  Recent Labs  03/09/15 0424  HGB 11.3*    Recent Labs  03/09/15 0424  WBC 9.7  RBC 4.00  HCT 35.6*  PLT 229    Recent Labs  03/09/15 0424  NA 140  K 3.8  CL 109  CO2 25  BUN 10  CREATININE 0.70  GLUCOSE 114*  CALCIUM 8.4*    EXAM General - Patient is Alert and Oriented Extremity - Neurologically intact Intact pulses distally Dorsiflexion/Plantar flexion intact Compartment soft Dressing/Incision - clean, dry, no drainage Motor Function - intact, moving foot and toes well on exam.   Past Medical History  Diagnosis Date  . Hyperlipidemia   . Status post primary angioplasty with coronary stent     DRUG-ELUTING STENT X1  TO PROXIMAL LAD  . Chronic back pain   . Chronic neck pain   . Arthritis     HANDS, KNEES  . DDD (degenerative disc disease), cervical   . DDD (degenerative disc disease), lumbar   . Radicular pain in left arm OCCASIONAL PAIN SHOOTING DOWN LEFT ARM SECAONDARY TO  CERVICAL DEGENERATION  . Hemorrhoid   . Diverticulitis of colon   . B12 deficiency anemia   . Vitamin D deficiency   .  Hypothyroidism   . Synovitis of knee HYPERTROPHIC RIGHT KNEE  . Abnormal stress test 01-22-2009    LOW RISK ADENOSINE NUCLEAR STUDY W/ PROBABLE MILD APICAL THINNING BUT NO ISCHEMIA  . Depression     states well controlled  . GERD (gastroesophageal reflux disease)   . Inguinal hernia, right     with ventral hernia  . Hypertension       PCP Dr Lysbeth Galas    . Hemorrhoids   . Coronary artery disease 2005    STRESS TEST /, NOte Dr Kirtland Bouchard EPIC/ no cardiologist now--STATES HEART STENT PLACED 2010  . Sleep apnea     couldnt tolerate machine- states last sleep study " many years ago"  . Bronchitis     hx of   . H/O hiatal hernia     Assessment/Plan: 1 Day Post-Op Procedure(s) (LRB): IRRIGATION AND DEBRIDEMENT of right KNEE (Right) Principal Problem:   Cellulitis of right knee Active Problems:   Hypothyroidism   Hyperlipidemia   Essential hypertension   Coronary atherosclerosis   GASTROESOPHAGEAL REFLUX DISEASE  Estimated body mass index is 40.25 kg/(m^2) as calculated from the following:   Height as of this encounter: 5' 4.5" (1.638 m).   Weight as of this encounter: 108.001 kg (238 lb 1.6 oz).  Advance diet Up with therapy D/C IV fluids Plan for discharge tomorrow  DVT Prophylaxis - Lovenox Weight-Bearing as tolerated  Keep hemovac today  Dimitri Ped, PA-C Orthopaedic Surgery 03/11/2015, 11:16 AM

## 2015-03-11 NOTE — Care Management Note (Signed)
Case Management Note  Patient Details  Name: Andrea Norton MRN: [id number] Date of Birth: 10/15/1945  Subjective/Objective:               69 yo admitted with Cellulitis of right knee     Action/Plan: From home with husband  Expected Discharge Date:                  Expected Discharge Plan:  [place]  In-House Referral:     Discharge planning Services  CM Consult  Post Acute Care Choice:  Home Health Choice offered to:  Patient  DME Arranged:    DME Agency:     HH Arranged:  PT [place]:  [facility]  Status of Service:  In process, will continue to follow  Medicare Important Message Given:  Yes-second notification given Date Medicare IM Given:    Medicare IM give by:    Date Additional Medicare IM Given:    Additional Medicare Important Message give by:     If discussed at [place] of Stay Meetings, dates discussed:    Additional Comments: PT recommending HHPT at [place]. Met with pt and husband at bedside to offer choice. Pt states she has used [facility] in the past and would like to use them again. [facility] rep alerted of referral. Pt has no new equipment needs at this time. CM will continue to follow.  Lynnell Catalan, RN 03/11/2015, 10:25 AM

## 2015-03-11 NOTE — Progress Notes (Signed)
[facility] PROGRESS NOTE  Andrea Norton ZOX:[id number] DOB: [birth year] DOA: 03/07/2015 PCP: Josue Hector, MD  Assessment/Plan: Right knee infection with cellulitis - orthopedic surgery following - Pseudomonas noted in knee culture - Pt is now s/p surgery on 8/16, tolerated well  Recent Diarrhea - Resolved. Formed stools this AM - Cdiff is extremely unlikely and no further w/u will be warranted at this time. Infection Control was made aware.   Hypotension  - shortly after IV dilaudid, decrease dose recently and discontinued Lisinopril  - Cont to monitor  Hypothyroidism - Continue levothyroxine.  Hyperlipidemia - Continue simvastatin.  Essential hypertension - Continue current therapy.  Code Status: Full Family Communication: Pt in room, family at bedside Disposition Plan: Poss d/c 8/18   Consultants:  Orthopedic Surgery  Procedures:    Antibiotics:  Zosyn 8/13 >>   Vancomycin 8/13 >>  HPI/Subjective: No complaints today.  Objective: Filed Vitals:   03/10/15 2223 03/10/15 2257 03/11/15 0540 03/11/15 1457  BP:  135/84 93/63 87/45   Pulse: 72 76 62 79  Temp:   98.1 F (36.7 C) 99.2 F (37.3 C)  TempSrc:  Oral Oral Oral  Resp: Height:      Weight:      SpO2: 100% 100% 96% 96%    Intake/Output Summary (Last 24 hours) at 03/11/15 1612 Last data filed at 03/11/15 0908  Gross per 24 hour  Intake   1550 ml  Output    750 ml  Net    800 ml   Filed Weights   03/08/15 0003  Weight: 108.001 kg (238 lb 1.6 oz)    Exam:   General:  Awake, in nad, laying in bed  Cardiovascular: regular, s1, s2  Respiratory: normal resp effort, no wheezing  Abdomen: soft,nondistended, pos BS  Musculoskeletal: perfused, no clubbing  Data Reviewed: Basic Metabolic Panel:  Recent Labs Lab 03/07/15 1852 03/08/15 0422 03/09/15 0424  NA 138 138 140  K 4.3 4.1 3.8  CL 104 105 109  CO2 GLUCOSE 109* 109* 114*  BUN CREATININE 0.70 0.91 0.70  CALCIUM 9.2 8.7* 8.4*   Liver Function Tests:  Recent Labs Lab 03/07/15 1852 03/08/15 0422 03/09/15 0424  AST 21 18 40  ALT 16 13* 43  ALKPHOS 76 63 75  BILITOT 1.0 1.5* 1.2  PROT 7.8 6.7 6.2*  ALBUMIN 3.6 3.1* 2.6*   No results for input(s): LIPASE, AMYLASE in the last 168 hours. No results for input(s): AMMONIA in the last 168 hours. CBC:  Recent Labs Lab 03/07/15 1852 03/08/15 0422 03/09/15 0424  WBC 15.8* 15.3* 9.7  NEUTROABS 13.1* 11.6* 6.9  HGB 13.6 12.7 11.3*  HCT 41.6 39.2 35.6*  MCV 90.0 90.7 89.0  PLT 282 274 229   Cardiac Enzymes: No results for input(s): CKTOTAL, CKMB, CKMBINDEX, TROPONINI in the last 168 hours. BNP (last 3 results) No results for input(s): BNP in the last 8760 hours.  ProBNP (last 3 results) No results for input(s): PROBNP in the last 8760 hours.  CBG: No results for input(s): GLUCAP in the last 168 hours.  Recent Results (from the past 240 hour(s))  Blood culture (routine x 2)     Status: None (Preliminary result)   Collection Time: 03/07/15  6:50 PM  Result Value Ref Range Status   Specimen Description BLOOD RIGHT ANTECUBITAL  Final   Special Requests BOTTLES DRAWN AEROBIC AND ANAEROBIC  Final   Culture  Final    NO GROWTH 3 DAYS Performed at [facility]    Report Status PENDING  Incomplete  Blood culture (routine x 2)     Status: None (Preliminary result)   Collection Time: 03/07/15  8:05 PM  Result Value Ref Range Status   Specimen Description BLOOD LEFT HAND  Final   Special Requests BOTTLES DRAWN AEROBIC ONLY  Final   Culture   Final    NO GROWTH 3 DAYS Performed at [facility]    Report Status PENDING  Incomplete  Anaerobic culture     Status: None (Preliminary result)   Collection Time: 03/07/15 10:40 PM  Result Value Ref Range Status   Specimen Description SYNOVIAL RIGHT KNEE  Final   Special Requests NONE  Final   Gram Stain   Final    CYTOSPIN  SMEAR WBC PRESENT,BOTH PMN AND MONONUCLEAR NO ORGANISMS SEEN    Culture   Final    NO ANAEROBES ISOLATED; CULTURE IN PROGRESS FOR 5 DAYS Performed at [facility]    Report Status PENDING  Incomplete  Body fluid culture     Status: None (Preliminary result)   Collection Time: 03/07/15 10:40 PM  Result Value Ref Range Status   Specimen Description SYNOVIAL RIGHT KNEE  Final   Special Requests NONE  Final   Gram Stain   Final    WBC PRESENT,BOTH PMN AND MONONUCLEAR CYTOSPIN SMEAR NO ORGANISMS SEEN RESULT CALLED TO, READ BACK BY AND VERIFIED WITH: Sondra Barges RN @ 0000 ON 03/08/15 BY C DAVIS   Culture   Final    FEW PSEUDOMONAS AERUGINOSA CRITICAL RESULT CALLED TO, READ BACK BY AND VERIFIED WITH: C MILLAR,RN AT 1102 03/09/15 BY L BENFIELD Performed at [facility]    Report Status PENDING  Incomplete   Organism ID, Bacteria PSEUDOMONAS AERUGINOSA  Final      Susceptibility   Pseudomonas aeruginosa - MIC*    CEFTAZIDIME <=1 SENSITIVE Sensitive     CIPROFLOXACIN <=0.25 SENSITIVE Sensitive     GENTAMICIN <=1 SENSITIVE Sensitive     IMIPENEM 2 SENSITIVE Sensitive     PIP/TAZO <=4 SENSITIVE Sensitive     CEFEPIME <=1 SENSITIVE Sensitive     * FEW PSEUDOMONAS AERUGINOSA  Surgical pcr screen     Status: None   Collection Time: 03/10/15  8:57 AM  Result Value Ref Range Status   MRSA, PCR NEGATIVE NEGATIVE Final   Staphylococcus aureus NEGATIVE NEGATIVE Final    Comment:        The Xpert SA Assay (FDA approved for NASAL specimens in patients over 22 years of age), is one component of a comprehensive surveillance program.  Test performance has been validated by [facility] for patients greater than or equal to 26 year old. It is not intended to diagnose infection nor to guide or monitor treatment.   Anaerobic culture     Status: None (Preliminary result)   Collection Time: 03/10/15  8:52 PM  Result Value Ref Range Status   Specimen Description SYNOVIAL RIGHT KNEE   Final   Special Requests PATIENT ON FOLLOWING ZOYSEN AND VANCOMYCIN  Final   Gram Stain   Final    RARE WBC PRESENT, PREDOMINANTLY PMN NO ORGANISMS SEEN Performed at [facility]    Culture   Final    NO ANAEROBES ISOLATED; CULTURE IN PROGRESS FOR 5 DAYS Performed at [facility]    Report Status PENDING  Incomplete  Body fluid culture  Status: None (Preliminary result)   Collection Time: 03/10/15  8:52 PM  Result Value Ref Range Status   Specimen Description SYNOVIAL RIGHT KNEE  Final   Special Requests ZOSYN,VANCOMYCIN  Final   Gram Stain   Final    FEW WBC PRESENT,BOTH PMN AND MONONUCLEAR NO ORGANISMS SEEN    Culture   Final    NO GROWTH < 12 HOURS Performed at [facility]    Report Status PENDING  Incomplete     Studies: No results found.  Scheduled Meds: . enoxaparin (LOVENOX) injection  40 mg Subcutaneous Q24H  . escitalopram  10 mg Oral QHS  . gabapentin  200 mg Oral BID  . levothyroxine  125 mcg Oral QAC breakfast  . metoprolol succinate  25 mg Oral Q lunch  . pantoprazole  40 mg Oral Q lunch  . piperacillin-tazobactam (ZOSYN)  IV  3.375 g Intravenous Q8H  . vancomycin  1,250 mg Intravenous Q12H   Continuous Infusions: . sodium chloride 75 mL/hr at 03/10/15 1205  . lactated ringers    . lactated ringers      Principal Problem:   Cellulitis of right knee Active Problems:   Hypothyroidism   Hyperlipidemia   Essential hypertension   Coronary atherosclerosis   GASTROESOPHAGEAL REFLUX DISEASE   Twanisha Foulk K  [facility] Pager [phone]. If 7PM-7AM, please contact night-coverage at [email or web address], password [facility] 03/11/2015, 4:12 PM  LOS: 4 days

## 2015-03-11 NOTE — Op Note (Signed)
[name], [name] NO.:  [id number]  MEDICAL RECORD NO.:  [id number]  LOCATION:  1342                         FACILITY:  [facility]  PHYSICIAN:  Ollen Gross, M.D.    DATE OF BIRTH:  [birth year]  DATE OF PROCEDURE:  03/10/2015 DATE OF DISCHARGE:                              OPERATIVE REPORT   PREOPERATIVE DIAGNOSIS:  Infected right knee.  POSTOPERATIVE DIAGNOSIS:  Infected right knee.  PROCEDURE:  Irrigation and debridement of right knee.  SURGEON:  Ollen Gross, M.D.  ASSISTANT:  No assistant.  ANESTHESIA:  General.  ESTIMATED BLOOD LOSS:  Minimal.  DRAINS:  Hemovac x1.  TOURNIQUET TIME:  35 minutes at 300 mmHg.  COMPLICATIONS:  None.  CONDITION:  Stable to recovery.  BRIEF CLINICAL NOTE:  [name] is a 69 year old female, long very complex history in regard to her right knee.  She presented to  the hospital 3 days ago with swollen knee with cellulitis.  She had an aspiration which showed rare Pseudomonas.  She presents now for irrigation and debridement of the knee.  PROCEDURE IN DETAIL:  After successful administration of general anesthetic,  a tourniquet was placed high on her right thigh and right lower extremity was prepped and draped in the usual sterile fashion. Extremities wrapped in Esmarch, tourniquet inflated to 300 mmHg.  A midline incision was made with a 10 blade through subcutaneous tissue. There was a large subcutaneous fluid collection.  This fluid was sent for Gram stain, culture, and sensitivity.  It had a series appearance with no purulence.  I thoroughly irrigated and debrided the subcu space. A fresh knife was used to make a medial parapatellar arthrotomy.  She had a large amount of hypertrophic scar and synovial tissue with minimal fluid in the joint.  The scar and synovial tissue was debrided sharply with a knife to normal-appearing tissue.  The prosthesis showed no signs of any loosening.  We thoroughly irrigated the  joint with 6 L of saline using pulsatile lavage.  We did not need to excise any further tissue as the tissue had a more normal appearance.  We then further irrigated and then closed the arthrotomy over Hemovac drain with a running #1 V-Loc suture.  The subcu was then further irrigated and closed over a Hemovac drain.  Second limb with interrupted 2-0 Vicryl.  The skin was then closed with staples.  Tourniquet was released with total time approximately 35 minutes.  The incision was then  cleaned and dried and a bulky sterile dressing applied.  The drains were hooked to suction. She was then placed into a knee immobilizer, awakened, and transported to recovery in stable condition.     Ollen Gross, M.D.     FA/MEDQ  D:  03/10/2015  T:  03/11/2015  Job:  [id number]

## 2015-03-12 ENCOUNTER — Other Ambulatory Visit: Payer: Self-pay | Admitting: Surgical

## 2015-03-12 DIAGNOSIS — Z96651 Presence of right artificial knee joint: Secondary | ICD-10-CM

## 2015-03-12 LAB — BODY FLUID CULTURE

## 2015-03-12 MED ORDER — CIPROFLOXACIN HCL 750 MG PO TABS: 750.0000 mg | ORAL_TABLET | Freq: Two times a day (BID) | ORAL | Status: DC

## 2015-03-12 MED ORDER — HYDROMORPHONE HCL 2 MG PO TABS: 2.0000 mg | ORAL_TABLET | Freq: Three times a day (TID) | ORAL | Status: DC | PRN

## 2015-03-12 NOTE — Care Management Note (Addendum)
Case Management Note  Patient Details  Name: Andrea Norton MRN: [id number] Date of Birth: Aug 14, 1945  Subjective/Objective:                   Right knee infection with cellulitis Action/Plan:  Discharge planning Expected Discharge Date:  03/12/15               Expected Discharge Plan:  Home w Home Health Services  In-House Referral:     Discharge planning Services  CM Consult  Post Acute Care Choice:  Home Health Choice offered to:  Patient  DME Arranged:    DME Agency:     HH Arranged:  PT HH Agency:  [facility]  Status of Service:  Completed, signed off  Medicare Important Message Given:  Yes-second notification given Date Medicare IM Given:    Medicare IM give by:    Date Additional Medicare IM Given:    Additional Medicare Important Message give by:     If discussed at Long Length of Stay Meetings, dates discussed:    Additional Comments: CM received call from [facility] as order and face to face needs to be placed.  CM requested both from RN, Amy.  No other CM needs were communicated.  CM notified [facility] rep, Baxter Hire of pt discharge.  [facility] providing HHPT.  No other CM needs were communicated. Yves Dill, RN 03/12/2015, 10:30 AM

## 2015-03-12 NOTE — Progress Notes (Signed)
Physical Therapy Treatment Patient Details Name: SHAMRA BRADEEN MRN: [id number] DOB: 03-Sep-1945 Today's Date: 03/12/2015    History of Present Illness 69 y.o. female with a past medical history of multiple bilateral knee surgeries (R TKA resection 2014, revision2015), hypertension, hypothyroidism, hyperlipidemia, coronary artery disease, depression, GERD who comes to the ER with complaints of fever, chills, fatigue associated with right knee tenderness.  Dx of cellulitis, septic arthritis.  s/p I &D 03/10/15.      PT Comments    Progressing well with mobility. Pt denied need to practice steps this admission. No further questions/concerns from pt/husband. Ready to d/c from PT standpoint.   Follow Up Recommendations  Home health PT     Equipment Recommendations  None recommended by PT   Recommendations for Other Services       Precautions / Restrictions Precautions Precautions: None Restrictions Weight Bearing Restrictions: No    Mobility  Bed Mobility               General bed mobility comments: pt oob in recliner  Transfers Overall transfer level: Needs assistance Equipment used: Rolling walker (2 wheeled) Transfers: Sit to/from Stand Sit to Stand: Min guard         General transfer comment: min/guard for safety  Ambulation/Gait Ambulation/Gait assistance: Min guard Ambulation Distance (Feet): 100 Feet Assistive device: Rolling walker (2 wheeled) Gait Pattern/deviations: Trunk flexed;Decreased stride length;Step-to pattern     General Gait Details: steady with RW, no LOB, trunk flexed (pt stated this is due to back pain, she's due for a shot in her back soon)   Stairs            Wheelchair Mobility    Modified Rankin (Stroke Patients Only)       Balance                                    Cognition Arousal/Alertness: Awake/alert Behavior During Therapy: WFL for tasks assessed/performed Overall Cognitive Status: Within  Functional Limits for tasks assessed                      Exercises Total Joint Exercises Ankle Circles/Pumps: AROM;Both;10 reps Quad Sets: AROM;Both;Supine;10 reps Short Arc QuadBarbaraann Boys;Right;10 reps;Supine Heel Slides: AAROM;Right;10 reps;Supine Hip ABduction/ADduction: AAROM;Right;10 reps;Supine Straight Leg Raises:  (deferred due to increased back pain this session) Goniometric ROM: ~5-60 degrees    General Comments        Pertinent Vitals/Pain Pain Assessment: 0-10 Pain Score: 7  Pain Location: back mostly. some pain with R knee as well Pain Descriptors / Indicators: Aching;Sore Pain Intervention(s): Limited activity within patient's tolerance;Repositioned;Ice applied    Home Living                      Prior Function            PT Goals (current goals can now be found in the care plan section) Progress towards PT goals: Progressing toward goals    Frequency  Min 5X/week    PT Plan Current plan remains appropriate    Co-evaluation             End of Session   Activity Tolerance: Patient tolerated treatment well Patient left: in chair;with call bell/phone within reach;with family/visitor present     Time: 0953-1003 PT Time Calculation (min) (ACUTE ONLY): 10 min  Charges:  $Gait Training: 8-22 mins  G Codes:      Weston Anna, MPT Pager: [phone]

## 2015-03-12 NOTE — Discharge Instructions (Signed)
Change dressing daily with sterile 4x4 gauze and paper tape °Shower only, no tub bath. °Call if any temperatures greater than 101 or any wound complications: 545-5000 during the day °Take aspirin 325mg daily to prevent blood clots  °

## 2015-03-12 NOTE — Progress Notes (Signed)
   Subjective: 2 Days Post-Op Procedure(s) (LRB): IRRIGATION AND DEBRIDEMENT of right KNEE (Right) Patient reports pain as mild.   Patient seen in rounds with Dr. Lequita Halt. Patient is well, and has had no acute complaints or problems Plan is to go Home after hospital stay.  Objective: Vital signs in last 24 hours: Temp:  [98 F (36.7 C)-99.2 F (37.3 C)] 98 F (36.7 C) (08/18 0653) Pulse Rate:  [69-79] 77 (08/18 0653) Resp:  [18] 18 (08/18 0653) BP: (83-125)/(36-60) 125/60 mmHg (08/18 0653) SpO2:  [93 %-96 %] 96 % (08/18 0653)  Intake/Output from previous day:  Intake/Output Summary (Last 24 hours) at 03/12/15 0806 Last data filed at 03/12/15 0654  Gross per 24 hour  Intake    720 ml  Output    335 ml  Net    385 ml     EXAM General - Patient is Alert and Oriented Extremity - Neurologically intact Intact pulses distally Dorsiflexion/Plantar flexion intact Compartment soft Dressing/Incision - clean, dry Motor Function - intact, moving foot and toes well on exam.  Hemovac removed without difficulty  Past Medical History  Diagnosis Date  . Hyperlipidemia   . Status post primary angioplasty with coronary stent     DRUG-ELUTING STENT X1  TO PROXIMAL LAD  . Chronic back pain   . Chronic neck pain   . Arthritis     HANDS, KNEES  . DDD (degenerative disc disease), cervical   . DDD (degenerative disc disease), lumbar   . Radicular pain in left arm OCCASIONAL PAIN SHOOTING DOWN LEFT ARM SECAONDARY TO  CERVICAL DEGENERATION  . Hemorrhoid   . Diverticulitis of colon   . B12 deficiency anemia   . Vitamin D deficiency   . Hypothyroidism   . Synovitis of knee HYPERTROPHIC RIGHT KNEE  . Abnormal stress test 01-22-2009    LOW RISK ADENOSINE NUCLEAR STUDY W/ PROBABLE MILD APICAL THINNING BUT NO ISCHEMIA  . Depression     states well controlled  . GERD (gastroesophageal reflux disease)   . Inguinal hernia, right     with ventral hernia  . Hypertension       PCP Dr Lysbeth Galas     . Hemorrhoids   . Coronary artery disease 2005    STRESS TEST /, NOte Dr Kirtland Bouchard EPIC/ no cardiologist now--STATES HEART STENT PLACED 2010  . Sleep apnea     couldnt tolerate machine- states last sleep study " many years ago"  . Bronchitis     hx of   . H/O hiatal hernia     Assessment/Plan: 2 Days Post-Op Procedure(s) (LRB): IRRIGATION AND DEBRIDEMENT of right KNEE (Right) Principal Problem:   Cellulitis of right knee Active Problems:   Hypothyroidism   Hyperlipidemia   Essential hypertension   Coronary atherosclerosis   GASTROESOPHAGEAL REFLUX DISEASE  Estimated body mass index is 40.25 kg/(m^2) as calculated from the following:   Height as of this encounter: 5' 4.5" (1.638 m).   Weight as of this encounter: 108.001 kg (238 lb 1.6 oz). Advance diet Up with therapy D/C IV fluids Discharge home with home health  DVT Prophylaxis - Lovenox Weight-Bearing as tolerated   Plan for DC home today on Cipro.   Dimitri Ped, PA-C Orthopaedic Surgery 03/12/2015, 8:06 AM

## 2015-03-12 NOTE — Anesthesia Postprocedure Evaluation (Signed)
  Anesthesia Post-op Note  Patient: Andrea Norton  Procedure(s) Performed: Procedure(s) (LRB): IRRIGATION AND DEBRIDEMENT of right KNEE (Right)  Patient Location: PACU  Anesthesia Type: General  Level of Consciousness: awake and alert   Airway and Oxygen Therapy: Patient Spontanous Breathing  Post-op Pain: mild  Post-op Assessment: Post-op Vital signs reviewed, Patient's Cardiovascular Status Stable, Respiratory Function Stable, Patent Airway and No signs of Nausea or vomiting  Last Vitals:  Filed Vitals:   03/12/15 0945  BP: 111/52  Pulse: 74  Temp:   Resp: 18    Post-op Vital Signs: stable   Complications: No apparent anesthesia complications

## 2015-03-13 LAB — CULTURE, BLOOD (ROUTINE X 2)
Culture: NO GROWTH
Culture: NO GROWTH

## 2015-03-13 LAB — ANAEROBIC CULTURE

## 2015-03-13 NOTE — Discharge Summary (Signed)
Physician Discharge Summary   Patient ID: Andrea Norton MRN: [id number] DOB/AGE: 11-23-1945 69 y.o.  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 Hyperlipidemia   . Status post primary angioplasty with coronary stent     DRUG-ELUTING STENT X1  TO PROXIMAL LAD  . Chronic back pain   . Chronic neck pain   . Arthritis     HANDS, KNEES  . DDD (degenerative disc disease), cervical   . DDD (degenerative disc disease), lumbar   . Radicular pain in left arm OCCASIONAL PAIN SHOOTING DOWN LEFT ARM SECAONDARY TO  CERVICAL DEGENERATION  . Hemorrhoid   . Diverticulitis of colon   . B12 deficiency anemia   . Vitamin D deficiency   . Hypothyroidism   . Synovitis of knee HYPERTROPHIC RIGHT KNEE  . Abnormal stress test 01-22-2009    LOW RISK ADENOSINE NUCLEAR STUDY W/ PROBABLE MILD APICAL THINNING BUT NO ISCHEMIA  . Depression     states well controlled  . GERD (gastroesophageal reflux disease)   . Inguinal hernia, right     with ventral hernia  . Hypertension       PCP Dr Edrick Oh    . Hemorrhoids   . Coronary artery disease 2005    STRESS TEST /, NOte Dr Jenkins [place] EPIC/ no cardiologist now--STATES [place] 2010  . Sleep apnea     couldnt tolerate machine- states last sleep study " many years ago"  . Bronchitis     hx of   . H/O hiatal hernia    Discharge Diagnoses:   Principal Problem:   Cellulitis of right knee Active Problems:   Hypothyroidism   Hyperlipidemia   Essential hypertension   Coronary atherosclerosis   GASTROESOPHAGEAL REFLUX DISEASE  Estimated body mass index is 40.25 kg/(m^2) as calculated from the following:   Height as of this encounter: 5' 4.5" (1.638 m).   Weight as of this encounter: 108.001 kg (238 lb 1.6 oz).  Procedure:  Procedure(s) (LRB): IRRIGATION AND DEBRIDEMENT of right KNEE (Right)     HPI: Andrea Norton is a 69 y.o. female with a past medical history of multiple bilateral knee surgeries, hypertension, hypothyroidism, hyperlipidemia, coronary artery disease, depression, GERD who comes to the ER with complaints of fever, chills, fatigue associated with right knee tenderness since this morning after she woke up. She denies any type of trauma to the area, but given all the multiple surgeries on the knee, she is probably at an increased risk for infection.  Orthopedic surgery was consulted by the emergency department and they performed an arthrocentesis which showed about 15 ML of Atia Haupt-colored fluid mixed with blood. This was sent to the lab for Gram stain, culture and sensitivity.  In the ER, she was given analgesics and feels better. She is currently in no acute distress.  Laboratory Data: Admission on 03/07/2015, Discharged on 03/12/2015  Component Date Value Ref Range Status  . WBC 03/07/2015 15.8* 4.0 - 10.5 K/uL Final  . RBC 03/07/2015 4.62  3.87 - 5.11 MIL/uL Final  . Hemoglobin 03/07/2015 13.6  12.0 - 15.0 g/dL Final  . HCT 03/07/2015 41.6  36.0 - 46.0 % Final  . MCV 03/07/2015 90.0  78.0 - 100.0 fL Final  . MCH 03/07/2015 29.4  26.0 - 34.0 pg Final  . MCHC 03/07/2015 32.7  30.0 - 36.0 g/dL Final  . RDW 03/07/2015  13.8  11.5 - 15.5 % Final  . Platelets 03/07/2015 282  150 - 400 K/uL Final  . Neutrophils Relative % 03/07/2015 83* 43 - 77 % Final  . Neutro Abs 03/07/2015 13.1* 1.7 - 7.7 K/uL Final  . Lymphocytes Relative 03/07/2015 9* 12 - 46 % Final  . Lymphs Abs 03/07/2015 1.3  0.7 - 4.0 K/uL Final  . Monocytes Relative 03/07/2015 7  3 - 12 % Final  . Monocytes Absolute 03/07/2015 1.2* 0.1 - 1.0 K/uL Final  . Eosinophils Relative 03/07/2015 1  0 - 5 % Final  . Eosinophils Absolute 03/07/2015 0.2  0.0 - 0.7 K/uL Final  . Basophils Relative 03/07/2015 0  0 - 1 % Final  . Basophils Absolute 03/07/2015 0.0  0.0 - 0.1 K/uL Final  . Sodium 03/07/2015 138  135 -  145 mmol/L Final  . Potassium 03/07/2015 4.3  3.5 - 5.1 mmol/L Final  . Chloride 03/07/2015 104  101 - 111 mmol/L Final  . CO2 03/07/2015 25  22 - 32 mmol/L Final  . Glucose, Bld 03/07/2015 109* 65 - 99 mg/dL Final  . BUN 03/07/2015 14  6 - 20 mg/dL Final  . Creatinine, Ser 03/07/2015 0.70  0.44 - 1.00 mg/dL Final  . Calcium 03/07/2015 9.2  8.9 - 10.3 mg/dL Final  . Total Protein 03/07/2015 7.8  6.5 - 8.1 g/dL Final  . Albumin 03/07/2015 3.6  3.5 - 5.0 g/dL Final  . AST 03/07/2015 21  15 - 41 U/L Final  . ALT 03/07/2015 16  14 - 54 U/L Final  . Alkaline Phosphatase 03/07/2015 76  38 - 126 U/L Final  . Total Bilirubin 03/07/2015 1.0  0.3 - 1.2 mg/dL Final  . GFR calc non Af Amer 03/07/2015 >60  >60 mL/min Final  . GFR calc Af Amer 03/07/2015 >60  >60 mL/min Final   Comment: (NOTE) The eGFR has been calculated using the CKD EPI equation. This calculation has not been validated in all clinical situations. eGFR's persistently <60 mL/min signify possible Chronic Kidney Disease.   . Anion gap 03/07/2015 9  5 - 15 Final  . Lactic Acid, Venous 03/07/2015 1.56  0.5 - 2.0 mmol/L Final  . Sed Rate 03/07/2015 41* 0 - 22 mm/hr Final  . CRP 03/07/2015 1.4* <1.0 mg/dL Final   Performed at [facility]  . Specimen Description 03/07/2015 BLOOD LEFT HAND   Final  . Special Requests 03/07/2015 BOTTLES DRAWN AEROBIC ONLY 4ML   Final  . Culture 03/07/2015    Final                   Value:NO GROWTH 4 DAYS Performed at [facility]   . Report Status 03/07/2015 PENDING   Incomplete  . Specimen Description 03/07/2015 BLOOD RIGHT ANTECUBITAL   Final  . Special Requests 03/07/2015 BOTTLES DRAWN AEROBIC AND ANAEROBIC 5ML   Final  . Culture 03/07/2015    Final                   Value:NO GROWTH 4 DAYS Performed at [facility]   . Report Status 03/07/2015 PENDING   Incomplete  . Specimen Description 03/07/2015 SYNOVIAL RIGHT KNEE   Final  . Special Requests 03/07/2015 NONE   Final   . Gram Stain 03/07/2015    Final                   Value:CYTOSPIN SMEAR WBC PRESENT,BOTH PMN AND MONONUCLEAR NO ORGANISMS SEEN   .  Culture 03/07/2015    Final                   Value:NO ANAEROBES ISOLATED Performed at [facility]   . Report Status 03/07/2015 03/13/2015 FINAL   Final  . Specimen Description 03/07/2015 SYNOVIAL RIGHT KNEE   Final  . Special Requests 03/07/2015 NONE   Final  . Gram Stain 03/07/2015 WBC PRESENT,BOTH PMN AND MONONUCLEAR CYTOSPIN SMEAR NO ORGANISMS SEEN RESULT CALLED TO, READ BACK BY AND VERIFIED WITH: Addison Naegeli RN @ 0000 ON 03/08/15 BY C DAVIS   Final  . Culture 03/07/2015    Final                   Value:FEW PSEUDOMONAS AERUGINOSA CRITICAL RESULT CALLED TO, READ BACK BY AND VERIFIED WITH: C MILLAR,RN AT 1102 03/09/15 BY L BENFIELD Performed at [facility]   . Report Status 03/07/2015 03/12/2015 FINAL   Final  . Organism ID, Bacteria 03/07/2015 PSEUDOMONAS AERUGINOSA   Final  . WBC 03/08/2015 15.3* 4.0 - 10.5 K/uL Final  . RBC 03/08/2015 4.32  3.87 - 5.11 MIL/uL Final  . Hemoglobin 03/08/2015 12.7  12.0 - 15.0 g/dL Final  . HCT 03/08/2015 39.2  36.0 - 46.0 % Final  . MCV 03/08/2015 90.7  78.0 - 100.0 fL Final  . MCH 03/08/2015 29.4  26.0 - 34.0 pg Final  . MCHC 03/08/2015 32.4  30.0 - 36.0 g/dL Final  . RDW 03/08/2015 14.2  11.5 - 15.5 % Final  . Platelets 03/08/2015 274  150 - 400 K/uL Final  . Neutrophils Relative % 03/08/2015 76  43 - 77 % Final  . Neutro Abs 03/08/2015 11.6* 1.7 - 7.7 K/uL Final  . Lymphocytes Relative 03/08/2015 13  12 - 46 % Final  . Lymphs Abs 03/08/2015 2.0  0.7 - 4.0 K/uL Final  . Monocytes Relative 03/08/2015 10  3 - 12 % Final  . Monocytes Absolute 03/08/2015 1.5* 0.1 - 1.0 K/uL Final  . Eosinophils Relative 03/08/2015 1  0 - 5 % Final  . Eosinophils Absolute 03/08/2015 0.1  0.0 - 0.7 K/uL Final  . Basophils Relative 03/08/2015 0  0 - 1 % Final  . Basophils Absolute 03/08/2015 0.0  0.0 - 0.1 K/uL Final  .  Sodium 03/08/2015 138  135 - 145 mmol/L Final  . Potassium 03/08/2015 4.1  3.5 - 5.1 mmol/L Final  . Chloride 03/08/2015 105  101 - 111 mmol/L Final  . CO2 03/08/2015 28  22 - 32 mmol/L Final  . Glucose, Bld 03/08/2015 109* 65 - 99 mg/dL Final  . BUN 03/08/2015 16  6 - 20 mg/dL Final  . Creatinine, Ser 03/08/2015 0.91  0.44 - 1.00 mg/dL Final  . Calcium 03/08/2015 8.7* 8.9 - 10.3 mg/dL Final  . Total Protein 03/08/2015 6.7  6.5 - 8.1 g/dL Final  . Albumin 03/08/2015 3.1* 3.5 - 5.0 g/dL Final  . AST 03/08/2015 18  15 - 41 U/L Final  . ALT 03/08/2015 13* 14 - 54 U/L Final  . Alkaline Phosphatase 03/08/2015 63  38 - 126 U/L Final  . Total Bilirubin 03/08/2015 1.5* 0.3 - 1.2 mg/dL Final  . GFR calc non Af Amer 03/08/2015 >60  >60 mL/min Final  . GFR calc Af Amer 03/08/2015 >60  >60 mL/min Final   Comment: (NOTE) The eGFR has been calculated using the CKD EPI equation. This calculation has not been validated in all clinical situations. eGFR's persistently <60 mL/min signify  possible Chronic Kidney Disease.   . Anion gap 03/08/2015 5  5 - 15 Final  . WBC 03/09/2015 9.7  4.0 - 10.5 K/uL Final  . RBC 03/09/2015 4.00  3.87 - 5.11 MIL/uL Final  . Hemoglobin 03/09/2015 11.3* 12.0 - 15.0 g/dL Final  . HCT 03/09/2015 35.6* 36.0 - 46.0 % Final  . MCV 03/09/2015 89.0  78.0 - 100.0 fL Final  . MCH 03/09/2015 28.3  26.0 - 34.0 pg Final  . MCHC 03/09/2015 31.7  30.0 - 36.0 g/dL Final  . RDW 03/09/2015 13.9  11.5 - 15.5 % Final  . Platelets 03/09/2015 229  150 - 400 K/uL Final  . Neutrophils Relative % 03/09/2015 71  43 - 77 % Final  . Neutro Abs 03/09/2015 6.9  1.7 - 7.7 K/uL Final  . Lymphocytes Relative 03/09/2015 16  12 - 46 % Final  . Lymphs Abs 03/09/2015 1.6  0.7 - 4.0 K/uL Final  . Monocytes Relative 03/09/2015 11  3 - 12 % Final  . Monocytes Absolute 03/09/2015 1.0  0.1 - 1.0 K/uL Final  . Eosinophils Relative 03/09/2015 2  0 - 5 % Final  . Eosinophils Absolute 03/09/2015 0.2  0.0 - 0.7  K/uL Final  . Basophils Relative 03/09/2015 0  0 - 1 % Final  . Basophils Absolute 03/09/2015 0.0  0.0 - 0.1 K/uL Final  . Sodium 03/09/2015 140  135 - 145 mmol/L Final  . Potassium 03/09/2015 3.8  3.5 - 5.1 mmol/L Final  . Chloride 03/09/2015 109  101 - 111 mmol/L Final  . CO2 03/09/2015 25  22 - 32 mmol/L Final  . Glucose, Bld 03/09/2015 114* 65 - 99 mg/dL Final  . BUN 03/09/2015 10  6 - 20 mg/dL Final  . Creatinine, Ser 03/09/2015 0.70  0.44 - 1.00 mg/dL Final  . Calcium 03/09/2015 8.4* 8.9 - 10.3 mg/dL Final  . Total Protein 03/09/2015 6.2* 6.5 - 8.1 g/dL Final  . Albumin 03/09/2015 2.6* 3.5 - 5.0 g/dL Final  . AST 03/09/2015 40  15 - 41 U/L Final  . ALT 03/09/2015 43  14 - 54 U/L Final  . Alkaline Phosphatase 03/09/2015 75  38 - 126 U/L Final  . Total Bilirubin 03/09/2015 1.2  0.3 - 1.2 mg/dL Final  . GFR calc non Af Amer 03/09/2015 >60  >60 mL/min Final  . GFR calc Af Amer 03/09/2015 >60  >60 mL/min Final   Comment: (NOTE) The eGFR has been calculated using the CKD EPI equation. This calculation has not been validated in all clinical situations. eGFR's persistently <60 mL/min signify possible Chronic Kidney Disease.   . Anion gap 03/09/2015 6  5 - 15 Final  . Vancomycin Tr 03/10/2015 11  10.0 - 20.0 ug/mL Final  . MRSA, PCR 03/10/2015 NEGATIVE  NEGATIVE Final  . Staphylococcus aureus 03/10/2015 NEGATIVE  NEGATIVE Final   Comment:        The Xpert SA Assay (FDA approved for NASAL specimens in patients over 44 years of age), is one component of a comprehensive surveillance program.  Test performance has been validated by [facility] for patients greater than or equal to 67 year old. It is not intended to diagnose infection nor to guide or monitor treatment.   Marland Kitchen Specimen Description 03/10/2015 SYNOVIAL RIGHT KNEE   Final  . Special Requests 03/10/2015 PATIENT ON FOLLOWING ZOYSEN AND VANCOMYCIN   Final  . Gram Stain 03/10/2015    Final  Value:RARE WBC  PRESENT, PREDOMINANTLY PMN NO ORGANISMS SEEN Performed at [facility]   . Culture 03/10/2015    Final                   Value:NO ANAEROBES ISOLATED; CULTURE IN PROGRESS FOR 5 DAYS Performed at [facility]   . Report Status 03/10/2015 PENDING   Incomplete  . Specimen Description 03/10/2015 SYNOVIAL RIGHT KNEE   Final  . Special Requests 03/10/2015 ZOSYN,VANCOMYCIN   Final  . Gram Stain 03/10/2015    Final                   Value:FEW WBC PRESENT,BOTH PMN AND MONONUCLEAR NO ORGANISMS SEEN   . Culture 03/10/2015    Final                   Value:FEW PSEUDOMONAS AERUGINOSA SUSCEPTIBILITIES PERFORMED ON PREVIOUS CULTURE WITHIN THE LAST 5 DAYS. Performed at [facility]   . Report Status 03/10/2015 PENDING   Incomplete     X-Rays:Dg Chest 2 View  03/07/2015   CLINICAL DATA:  Fever chills and generalized body aches.  EXAM: CHEST - 2 VIEW  COMPARISON:  Two-view chest x-ray 01/14/2014  FINDINGS: Heart is within normal limits size. Mild interstitial coarsening is likely chronic. No focal airspace disease is evident. The visualized soft tissues and bony thorax are unremarkable.  IMPRESSION: No acute cardiopulmonary disease or significant interval change.   Electronically Signed   By: San Morelle M.D.   On: 03/07/2015 19:36   Dg Knee Complete 4 Views Right  03/07/2015   CLINICAL DATA:  Fever and chills  EXAM: RIGHT KNEE - COMPLETE 4+ VIEW  COMPARISON:  09/27/2012  FINDINGS: Considerable soft tissue swelling is noted surrounding the knee joint. A knee prosthesis is again identified and stable. Significant joint effusion is seen no loosening is identified at this time.  IMPRESSION: Soft tissue swelling and large joint effusion.  No acute bony abnormality is seen.   Electronically Signed   By: Inez Catalina M.D.   On: 03/07/2015 19:39    EKG: Orders placed or performed in visit on 01/14/14  . EKG 12-Lead     Hospital Course: KELSHA OLDER is a 69 y.o. who was  admitted to [facility]. They were brought to the operating room on 03/07/2015 - 03/10/2015 and underwent Procedure(s): IRRIGATION AND DEBRIDEMENT of right KNEE.  Patient tolerated the procedure well and was later transferred to the recovery room and then to the orthopaedic floor for postoperative care.  They were given PO and IV analgesics for pain control following their surgery.  They were given 24 hours of postoperative antibiotics of  Anti-infectives    Start     Dose/Rate Route Frequency Ordered Stop   03/12/15 0000  ciprofloxacin (CIPRO) 750 MG tablet     750 mg Oral 2 times daily 03/12/15 0813     03/11/15 2200  ciprofloxacin (CIPRO) tablet 750 mg  Status:  Discontinued     750 mg Oral 2 times daily 03/11/15 1928 03/12/15 1357   03/10/15 1800  vancomycin (VANCOCIN) 1,250 mg in sodium chloride 0.9 % 250 mL IVPB  Status:  Discontinued     1,250 mg 166.7 mL/hr over 90 Minutes Intravenous Every 12 hours 03/10/15 1005 03/11/15 1928   03/08/15 0900  vancomycin (VANCOCIN) IVPB 1000 mg/200 mL premix  Status:  Discontinued     1,000 mg 200 mL/hr over 60 Minutes Intravenous Every 12  hours 03/08/15 0008 03/10/15 1005   03/08/15 0400  piperacillin-tazobactam (ZOSYN) IVPB 3.375 g  Status:  Discontinued     3.375 g 12.5 mL/hr over 240 Minutes Intravenous Every 8 hours 03/08/15 0008 03/11/15 1928   03/07/15 1915  vancomycin (VANCOCIN) IVPB 1000 mg/200 mL premix     1,000 mg 200 mL/hr over 60 Minutes Intravenous  Once 03/07/15 1911 03/07/15 2214   03/07/15 1915  piperacillin-tazobactam (ZOSYN) IVPB 3.375 g     3.375 g 12.5 mL/hr over 240 Minutes Intravenous  Once 03/07/15 1911 03/07/15 2114     and started on DVT prophylaxis in the form of Lovenox.   PT and OT were ordered.  Discharge planning consulted to help with postop disposition and equipment needs.  Patient had a fair night on the evening of surgery. Internal medicine and infectious disease were involved with patient care and  determination of antibiotics. Hemovac drain was pulled post op day two without difficulty.  Continued to work with therapy into day two.  Dressing was changed on day two and the incision was clean and dry. Cultures showed to be sensitive to Cipro. Patient was seen in rounds and was ready to go home.   Diet: Cardiac diet Activity:WBAT Follow-up:in 2 weeks Disposition - Home Discharged Condition: stable   Discharge Instructions    Call MD / Call 911    Complete by:  As directed   If you experience chest pain or shortness of breath, CALL 911 and be transported to the hospital emergency room.  If you develope a fever above 101 F, pus (white drainage) or increased drainage or redness at the wound, or calf pain, call your surgeon's office.     Constipation Prevention    Complete by:  As directed   Drink plenty of fluids.  Prune juice may be helpful.  You may use a stool softener, such as Colace (over the counter) 100 mg twice a day.  Use MiraLax (over the counter) for constipation as needed.     Diet - low sodium heart healthy    Complete by:  As directed      Discharge instructions    Complete by:  As directed   Change dressing daily with sterile 4x4 gauze and paper tape Shower only, no tub bath. Call if any temperatures greater than 101 or any wound complications: [phone] during the day Take aspirin 345m daily to prevent blood clots     Increase activity slowly as tolerated    Complete by:  As directed             Medication List    STOP taking these medications        aspirin EC 81 MG tablet      TAKE these medications        calcium citrate-vitamin D 315-200 MG-UNIT per tablet  Commonly known as:  CITRACAL+D  Take 1-2 tablets by mouth daily.     ciprofloxacin 750 MG tablet  Commonly known as:  CIPRO  Take 1 tablet (750 mg total) by mouth 2 (two) times daily.     cyanocobalamin 1000 MCG/ML injection  Commonly known as:  (VITAMIN B-12)  Inject 1,000 mcg into the skin  every 30 (thirty) days.     escitalopram 10 MG tablet  Commonly known as:  LEXAPRO  Take 10 mg by mouth at bedtime.     FLINSTONES GUMMIES OMEGA-3 DHA PO  Take 1 each by mouth 2 (two) times daily.  gabapentin 100 MG capsule  Commonly known as:  NEURONTIN  Take 200 mg by mouth 2 (two) times daily.     HYDROmorphone 2 MG tablet  Commonly known as:  DILAUDID  Take 1-2 tablets (2-4 mg total) by mouth every 8 (eight) hours as needed. pain     ketoconazole 2 % cream  Commonly known as:  NIZORAL  Apply 1 application topically daily as needed. Apply to vagina for irritation     levothyroxine 125 MCG tablet  Commonly known as:  SYNTHROID, LEVOTHROID  Take 125 mcg by mouth daily before breakfast. Brand Name Only     lisinopril 10 MG tablet  Commonly known as:  PRINIVIL,ZESTRIL  Take 10 mg by mouth daily with lunch.     metoprolol succinate 25 MG 24 hr tablet  Commonly known as:  TOPROL-XL  Take 25 mg by mouth daily with lunch.     pantoprazole 40 MG tablet  Commonly known as:  PROTONIX  Take 40 mg by mouth daily with lunch.     simvastatin 40 MG tablet  Commonly known as:  ZOCOR  Take 40 mg by mouth daily.     Vitamin D (Ergocalciferol) 50000 UNITS Caps capsule  Commonly known as:  DRISDOL  Take 50,000 Units by mouth every 7 (seven) days. Fridays           Follow-up Information    Follow up with Gearlean Alf, MD. Schedule an appointment as soon as possible for a visit on 03/26/2015.   Specialty:  Orthopedic Surgery   Contact information:   [address] [place] [address] [phone]       Follow up with [facility].   Why:  home health physical therapy   Contact information:   [place] [address] [phone]       Signed: Ardeen Jourdain, PA-C Orthopaedic Surgery 03/13/2015, 12:37 PM

## 2015-03-14 LAB — BODY FLUID CULTURE

## 2015-03-15 LAB — ANAEROBIC CULTURE

## 2015-05-27 NOTE — Patient Instructions (Addendum)
Andrea Norton  05/27/2015   Your procedure is scheduled on: 06/02/2015    Report to [facility] Main  Entrance take [facility]  elevators to 3rd floor to  Short Stay Center at      4:00 PM.  Call this number if you have problems the morning of surgery [phone]   Remember: ONLY 1 PERSON MAY GO WITH YOU TO SHORT STAY TO GET  READY MORNING OF YOUR SURGERY.  Do not eat food after midnite. May have clear liquids from 12 midnite until 1200 noon then nothing by mouth.       Take these medicines the morning of surgery with A SIP OF WATER:   Hydromorphone ( dilaudid), Synthroid, Toprol ( Metoprolol), Protonix,  Gabapentin DO NOT TAKE ANY DIABETIC MEDICATIONS DAY OF YOUR SURGERY                               You may not have any metal on your body including hair pins and              piercings  Do not wear jewelry, make-up, lotions, powders or perfumes, deodorant             Do not wear nail polish.  Do not shave  48 hours prior to surgery.     Do not bring valuables to the hospital. [facility] IS NOT             RESPONSIBLE   FOR VALUABLES.  Contacts, dentures or bridgework may not be worn into surgery.  Leave suitcase in the car. After surgery it may be brought to your room.      Special Instructions: coughing and deep breathing exercises,leg exercises    CLEAR LIQUID DIET   Foods Allowed                                                                     Foods Excluded  Coffee and tea, regular and decaf                             liquids that you cannot  Plain Jell-O in any flavor                                             see through such as: Fruit ices (not with fruit pulp)                                     milk, soups, orange juice  Iced Popsicles                                    All solid food Carbonated beverages, regular and diet  Cranberry, grape and apple juices Sports drinks like Gatorade Lightly seasoned clear  broth or consume(fat free) Sugar, honey syrup  Sample Menu Breakfast                                Lunch                                     Supper Cranberry juice                    Beef broth                            Chicken broth Jell-O                                     Grape juice                           Apple juice Coffee or tea                        Jell-O                                      Popsicle                                                Coffee or tea                        Coffee or tea  _____________________________________________________________________                Please read over the following fact sheets you were given: _____________________________________________________________________             [facility] - Preparing for [facility] Before surgery, you can play an important role.  Because skin is not sterile, your skin needs to be as free of germs as possible.  You can reduce the number of germs on your skin by washing with CHG (chlorahexidine gluconate) soap before surgery.  CHG is an antiseptic cleaner which kills germs and bonds with the skin to continue killing germs even after washing. Please DO NOT use if you have an allergy to CHG or antibacterial soaps.  If your skin becomes reddened/irritated stop using the CHG and inform your nurse when you arrive at Short Stay. Do not shave (including legs and underarms) for at least 48 hours prior to the first CHG shower.  You may shave your face/neck. Please follow these instructions carefully:  1.  Shower with CHG Soap the night before surgery and the  morning of Surgery.  2.  If you choose to wash your hair, wash your hair first as usual with your  normal  shampoo.  3.  After you shampoo, rinse your hair and body thoroughly to remove the  shampoo.  4.  Use CHG as you would any other liquid soap.  You can apply chg directly  to the skin and wash                       Gently with a  scrungie or clean washcloth.  5.  Apply the CHG Soap to your body ONLY FROM THE NECK DOWN.   Do not use on face/ open                           Wound or open sores. Avoid contact with eyes, ears mouth and genitals (private parts).                       Wash face,  Genitals (private parts) with your normal soap.             6.  Wash thoroughly, paying special attention to the area where your surgery  will be performed.  7.  Thoroughly rinse your body with warm water from the neck down.  8.  DO NOT shower/wash with your normal soap after using and rinsing off  the CHG Soap.                9.  Pat yourself dry with a clean towel.            10.  Wear clean pajamas.            11.  Place clean sheets on your bed the night of your first shower and do not  sleep with pets. Day of Surgery : Do not apply any lotions/deodorants the morning of surgery.  Please wear clean clothes to the hospital/surgery center.  FAILURE TO FOLLOW THESE INSTRUCTIONS MAY RESULT IN THE CANCELLATION OF YOUR SURGERY PATIENT SIGNATURE_________________________________  NURSE SIGNATURE__________________________________  ________________________________________________________________________   [name]  An incentive spirometer is a tool that can help keep your lungs clear and active. This tool measures how well you are filling your lungs with each breath. Taking long deep breaths may help reverse or decrease the chance of developing breathing (pulmonary) problems (especially infection) following:  A long period of time when you are unable to move or be active. BEFORE THE PROCEDURE   If the spirometer includes an indicator to show your best effort, your nurse or respiratory therapist will set it to a desired goal.  If possible, sit up straight or lean slightly forward. Try not to slouch.  Hold the incentive spirometer in an upright position. INSTRUCTIONS FOR USE  1. Sit on the edge of your bed if possible,  or sit up as far as you can in bed or on a chair. 2. Hold the incentive spirometer in an upright position. 3. Breathe out normally. 4. Place the mouthpiece in your mouth and seal your lips tightly around it. 5. Breathe in slowly and as deeply as possible, raising the piston or the ball toward the top of the column. 6. Hold your breath for 3-5 seconds or for as long as possible. Allow the piston or ball to fall to the bottom of the column. 7. Remove the mouthpiece from your mouth and breathe out normally. 8. Rest for a few seconds and repeat Steps 1 through 7 at least 10 times every 1-2 hours when you are awake. Take your time and take a few normal breaths between deep breaths. 9. The spirometer may include an indicator to  show your best effort. Use the indicator as a goal to work toward during each repetition. 10. After each set of 10 deep breaths, practice coughing to be sure your lungs are clear. If you have an incision (the cut made at the time of surgery), support your incision when coughing by placing a pillow or rolled up towels firmly against it. Once you are able to get out of bed, walk around indoors and cough well. You may stop using the incentive spirometer when instructed by your caregiver.  RISKS AND COMPLICATIONS  Take your time so you do not get dizzy or light-headed.  If you are in pain, you may need to take or ask for pain medication before doing incentive spirometry. It is harder to take a deep breath if you are having pain. AFTER USE  Rest and breathe slowly and easily.  It can be helpful to keep track of a log of your progress. Your caregiver can provide you with a simple table to help with this. If you are using the spirometer at home, follow these instructions: [place] IF:   You are having difficultly using the spirometer.  You have trouble using the spirometer as often as instructed.  Your pain medication is not giving enough relief while using the  spirometer.  You develop fever of 100.5 F (38.1 C) or higher. SEEK IMMEDIATE MEDICAL CARE IF:   You cough up bloody sputum that had not been present before.  You develop fever of 102 F (38.9 C) or greater.  You develop worsening pain at or near the incision site. MAKE SURE YOU:   Understand these instructions.  Will watch your condition.  Will get help right away if you are not doing well or get worse. Document Released: 11/21/2006 Document Revised: 10/03/2011 Document Reviewed: 01/22/2007 [facility] Patient Information 2014 [place], [place].   ________________________________________________________________________

## 2015-05-28 ENCOUNTER — Ambulatory Visit: Payer: Self-pay | Admitting: Orthopedic Surgery

## 2015-05-28 ENCOUNTER — Encounter (HOSPITAL_COMMUNITY)
Admission: RE | Admit: 2015-05-28 | Discharge: 2015-05-28 | Disposition: A | Discharge status: Home / Self Care | Payer: Medicare Other | Source: Ambulatory Visit | Attending: Orthopedic Surgery | Admitting: Orthopedic Surgery

## 2015-05-28 ENCOUNTER — Encounter (HOSPITAL_COMMUNITY): Payer: Self-pay

## 2015-05-28 DIAGNOSIS — S76119A Strain of unspecified quadriceps muscle, fascia and tendon, initial encounter: Secondary | ICD-10-CM | POA: Insufficient documentation

## 2015-05-28 DIAGNOSIS — Z01818 Encounter for other preprocedural examination: Secondary | ICD-10-CM | POA: Diagnosis present

## 2015-05-28 HISTORY — DX: Pneumonia, unspecified organism: J18.9

## 2015-05-28 LAB — CBC
HCT: 39.1 % (ref 36.0–46.0)
Hemoglobin: 12.5 g/dL (ref 12.0–15.0)
MCH: 28.9 pg (ref 26.0–34.0)
MCHC: 32 g/dL (ref 30.0–36.0)
MCV: 90.5 fL (ref 78.0–100.0)
PLATELETS: 316 10*3/uL (ref 150–400)
RBC: 4.32 MIL/uL (ref 3.87–5.11)
RDW: 14.3 % (ref 11.5–15.5)
WBC: 7.4 10*3/uL (ref 4.0–10.5)

## 2015-05-28 LAB — BASIC METABOLIC PANEL
Anion gap: 6 (ref 5–15)
BUN: 11 mg/dL (ref 6–20)
CALCIUM: 9.1 mg/dL (ref 8.9–10.3)
CO2: 28 mmol/L (ref 22–32)
CREATININE: 0.77 mg/dL (ref 0.44–1.00)
Chloride: 108 mmol/L (ref 101–111)
GFR calc non Af Amer: >60 mL/min (ref >60)
Glucose, Bld: 104 mg/dL — ABNORMAL HIGH (ref 65–99)
Potassium: 4.5 mmol/L (ref 3.5–5.1)
SODIUM: 142 mmol/L (ref 135–145)

## 2015-05-28 NOTE — Progress Notes (Addendum)
03-10-15 - I & D rt. Knee - EPIC 03-07-15 - 2V CXR - EPIC 11-2014 - Rt. Knee scope - EPIC

## 2015-05-28 NOTE — Progress Notes (Signed)
Preoperative surgical orders have been place into the Epic hospital system for Andrea Norton on 05/28/2015, 10:07 AM  by Patrica DuelPERKINS, Brystol Wasilewski for surgery on 06-02-2015.  Preop Knee orders including IV Tylenol and IV Decadron as long as there are no contraindications to the above medications. [name], PA-C

## 2015-06-02 ENCOUNTER — Observation Stay (HOSPITAL_COMMUNITY)
Admission: RE | Admit: 2015-06-02 | Discharge: 2015-06-04 | Disposition: A | Discharge status: Home / Self Care | Payer: Medicare Other | Source: Ambulatory Visit | Attending: Orthopedic Surgery | Admitting: Orthopedic Surgery

## 2015-06-02 ENCOUNTER — Encounter (HOSPITAL_COMMUNITY)
Admission: RE | Disposition: A | Discharge status: Home / Self Care | Payer: Self-pay | Source: Ambulatory Visit | Attending: Orthopedic Surgery

## 2015-06-02 ENCOUNTER — Ambulatory Visit (HOSPITAL_COMMUNITY): Payer: Medicare Other | Admitting: Anesthesiology

## 2015-06-02 ENCOUNTER — Encounter (HOSPITAL_COMMUNITY): Payer: Self-pay | Admitting: *Deleted

## 2015-06-02 DIAGNOSIS — S83014A Lateral dislocation of right patella, initial encounter: Secondary | ICD-10-CM | POA: Diagnosis present

## 2015-06-02 DIAGNOSIS — K449 Diaphragmatic hernia without obstruction or gangrene: Secondary | ICD-10-CM | POA: Diagnosis not present

## 2015-06-02 DIAGNOSIS — G4730 Sleep apnea, unspecified: Secondary | ICD-10-CM | POA: Insufficient documentation

## 2015-06-02 DIAGNOSIS — M19041 Primary osteoarthritis, right hand: Secondary | ICD-10-CM | POA: Insufficient documentation

## 2015-06-02 DIAGNOSIS — I10 Essential (primary) hypertension: Secondary | ICD-10-CM | POA: Insufficient documentation

## 2015-06-02 DIAGNOSIS — S76111A Strain of right quadriceps muscle, fascia and tendon, initial encounter: Secondary | ICD-10-CM | POA: Diagnosis not present

## 2015-06-02 DIAGNOSIS — Z96653 Presence of artificial knee joint, bilateral: Secondary | ICD-10-CM | POA: Insufficient documentation

## 2015-06-02 DIAGNOSIS — Z87891 Personal history of nicotine dependence: Secondary | ICD-10-CM | POA: Diagnosis not present

## 2015-06-02 DIAGNOSIS — I2510 Atherosclerotic heart disease of native coronary artery without angina pectoris: Secondary | ICD-10-CM | POA: Diagnosis not present

## 2015-06-02 DIAGNOSIS — M19042 Primary osteoarthritis, left hand: Secondary | ICD-10-CM | POA: Diagnosis not present

## 2015-06-02 DIAGNOSIS — K219 Gastro-esophageal reflux disease without esophagitis: Secondary | ICD-10-CM | POA: Diagnosis not present

## 2015-06-02 DIAGNOSIS — E039 Hypothyroidism, unspecified: Secondary | ICD-10-CM | POA: Diagnosis not present

## 2015-06-02 DIAGNOSIS — M5136 Other intervertebral disc degeneration, lumbar region: Secondary | ICD-10-CM | POA: Insufficient documentation

## 2015-06-02 DIAGNOSIS — M5030 Other cervical disc degeneration, unspecified cervical region: Secondary | ICD-10-CM | POA: Insufficient documentation

## 2015-06-02 DIAGNOSIS — E785 Hyperlipidemia, unspecified: Secondary | ICD-10-CM | POA: Insufficient documentation

## 2015-06-02 DIAGNOSIS — X58XXXA Exposure to other specified factors, initial encounter: Secondary | ICD-10-CM | POA: Insufficient documentation

## 2015-06-02 DIAGNOSIS — Z9841 Cataract extraction status, right eye: Secondary | ICD-10-CM | POA: Diagnosis not present

## 2015-06-02 DIAGNOSIS — S76119A Strain of unspecified quadriceps muscle, fascia and tendon, initial encounter: Secondary | ICD-10-CM | POA: Diagnosis present

## 2015-06-02 DIAGNOSIS — Z961 Presence of intraocular lens: Secondary | ICD-10-CM | POA: Insufficient documentation

## 2015-06-02 HISTORY — PX: QUADRICEPS TENDON REPAIR: SHX756

## 2015-06-02 SURGERY — REPAIR, TENDON, QUADRICEPS
Anesthesia: General | Site: Knee | Laterality: Right

## 2015-06-02 MED ORDER — SODIUM CHLORIDE 0.9 % IV SOLN
INTRAVENOUS | Status: DC
  Administered 2015-06-02 – 2015-06-03 (×2): via INTRAVENOUS

## 2015-06-02 MED ORDER — SIMVASTATIN 40 MG PO TABS
40.0000 mg | ORAL_TABLET | Freq: Every day | ORAL | Status: DC
  Administered 2015-06-03: 40 mg via ORAL
  Filled 2015-06-02 (×2): qty 1

## 2015-06-02 MED ORDER — FENTANYL CITRATE (PF) 100 MCG/2ML IJ SOLN
INTRAMUSCULAR | Status: AC
  Filled 2015-06-02: qty 4

## 2015-06-02 MED ORDER — ONDANSETRON HCL 4 MG/2ML IJ SOLN
INTRAMUSCULAR | Status: AC
  Filled 2015-06-02: qty 2

## 2015-06-02 MED ORDER — HYDROMORPHONE HCL 1 MG/ML IJ SOLN
INTRAMUSCULAR | Status: AC
  Filled 2015-06-02: qty 1

## 2015-06-02 MED ORDER — HYDROMORPHONE HCL 1 MG/ML IJ SOLN
0.2500 mg | INTRAMUSCULAR | Status: DC | PRN
  Administered 2015-06-02 (×3): 0.5 mg via INTRAVENOUS

## 2015-06-02 MED ORDER — SODIUM CHLORIDE 0.9 % IV SOLN: INTRAVENOUS | Status: DC

## 2015-06-02 MED ORDER — PROPOFOL 10 MG/ML IV BOLUS
INTRAVENOUS | Status: AC
  Filled 2015-06-02: qty 20

## 2015-06-02 MED ORDER — EPHEDRINE SULFATE 50 MG/ML IJ SOLN
INTRAMUSCULAR | Status: DC | PRN
Start: 2015-06-02 — End: 2015-06-02
  Administered 2015-06-02: 10 mg via INTRAVENOUS

## 2015-06-02 MED ORDER — LACTATED RINGERS IV SOLN: INTRAVENOUS | Status: DC

## 2015-06-02 MED ORDER — MIDAZOLAM HCL 5 MG/5ML IJ SOLN
INTRAMUSCULAR | Status: DC | PRN
  Administered 2015-06-02: 2 mg via INTRAVENOUS

## 2015-06-02 MED ORDER — BISACODYL 10 MG RE SUPP: 10.0000 mg | Freq: Every day | RECTAL | Status: DC | PRN

## 2015-06-02 MED ORDER — ONDANSETRON HCL 4 MG/2ML IJ SOLN: 4.0000 mg | Freq: Four times a day (QID) | INTRAMUSCULAR | Status: DC | PRN

## 2015-06-02 MED ORDER — PANTOPRAZOLE SODIUM 40 MG PO TBEC
40.0000 mg | DELAYED_RELEASE_TABLET | Freq: Every day | ORAL | Status: DC
  Administered 2015-06-03: 40 mg via ORAL
  Filled 2015-06-02 (×2): qty 1

## 2015-06-02 MED ORDER — FENTANYL CITRATE (PF) 100 MCG/2ML IJ SOLN
INTRAMUSCULAR | Status: DC | PRN
  Administered 2015-06-02: 100 ug via INTRAVENOUS

## 2015-06-02 MED ORDER — HYDROMORPHONE HCL 2 MG PO TABS
2.0000 mg | ORAL_TABLET | ORAL | Status: DC | PRN
  Administered 2015-06-03: 2 mg via ORAL
  Administered 2015-06-03: 4 mg via ORAL
  Administered 2015-06-03 (×3): 2 mg via ORAL
  Administered 2015-06-04 (×2): 4 mg via ORAL
  Filled 2015-06-02 (×3): qty 1
  Filled 2015-06-02: qty 2
  Filled 2015-06-02: qty 1
  Filled 2015-06-02 (×2): qty 2

## 2015-06-02 MED ORDER — ACETAMINOPHEN 500 MG PO TABS
1000.0000 mg | ORAL_TABLET | Freq: Four times a day (QID) | ORAL | Status: AC
  Administered 2015-06-03 (×2): 1000 mg via ORAL
  Administered 2015-06-03: 500 mg via ORAL
  Administered 2015-06-03: 1000 mg via ORAL
  Filled 2015-06-02 (×5): qty 2

## 2015-06-02 MED ORDER — HYDROMORPHONE HCL 1 MG/ML IJ SOLN
INTRAMUSCULAR | Status: DC | PRN
  Administered 2015-06-02: .4 mg via INTRAVENOUS
  Administered 2015-06-02: 0.5 mg via INTRAVENOUS
  Administered 2015-06-02: .6 mg via INTRAVENOUS

## 2015-06-02 MED ORDER — GABAPENTIN 300 MG PO CAPS
300.0000 mg | ORAL_CAPSULE | Freq: Two times a day (BID) | ORAL | Status: DC
  Administered 2015-06-02 – 2015-06-04 (×4): 300 mg via ORAL
  Filled 2015-06-02 (×5): qty 1

## 2015-06-02 MED ORDER — ACETAMINOPHEN 10 MG/ML IV SOLN
1000.0000 mg | Freq: Once | INTRAVENOUS | Status: AC
  Administered 2015-06-02: 1000 mg via INTRAVENOUS

## 2015-06-02 MED ORDER — MORPHINE SULFATE (PF) 2 MG/ML IV SOLN
1.0000 mg | INTRAVENOUS | Status: DC | PRN
  Filled 2015-06-02: qty 1

## 2015-06-02 MED ORDER — MIDAZOLAM HCL 2 MG/2ML IJ SOLN
INTRAMUSCULAR | Status: AC
  Filled 2015-06-02: qty 4

## 2015-06-02 MED ORDER — CEFAZOLIN SODIUM-DEXTROSE 2-3 GM-% IV SOLR
2.0000 g | INTRAVENOUS | Status: AC
  Administered 2015-06-02: 2 g via INTRAVENOUS

## 2015-06-02 MED ORDER — ONDANSETRON HCL 4 MG/2ML IJ SOLN
INTRAMUSCULAR | Status: DC | PRN
  Administered 2015-06-02: 4 mg via INTRAVENOUS

## 2015-06-02 MED ORDER — METOPROLOL SUCCINATE ER 25 MG PO TB24
25.0000 mg | ORAL_TABLET | Freq: Every day | ORAL | Status: DC
  Filled 2015-06-02 (×2): qty 1

## 2015-06-02 MED ORDER — ONDANSETRON HCL 4 MG PO TABS: 4.0000 mg | ORAL_TABLET | Freq: Four times a day (QID) | ORAL | Status: DC | PRN

## 2015-06-02 MED ORDER — DEXAMETHASONE SODIUM PHOSPHATE 10 MG/ML IJ SOLN
10.0000 mg | Freq: Once | INTRAMUSCULAR | Status: AC
  Administered 2015-06-02: 10 mg via INTRAVENOUS

## 2015-06-02 MED ORDER — LEVOTHYROXINE SODIUM 125 MCG PO TABS
125.0000 ug | ORAL_TABLET | Freq: Every day | ORAL | Status: DC
Start: 2015-06-03 — End: 2015-06-04
  Administered 2015-06-03 – 2015-06-04 (×2): 125 ug via ORAL
  Filled 2015-06-02 (×3): qty 1

## 2015-06-02 MED ORDER — ACETAMINOPHEN 650 MG RE SUPP: 650.0000 mg | Freq: Four times a day (QID) | RECTAL | Status: DC | PRN

## 2015-06-02 MED ORDER — ACETAMINOPHEN 325 MG PO TABS
650.0000 mg | ORAL_TABLET | Freq: Four times a day (QID) | ORAL | Status: DC | PRN
  Administered 2015-06-03: 650 mg via ORAL
  Filled 2015-06-02: qty 2

## 2015-06-02 MED ORDER — DEXTROSE 5 % IV SOLN
500.0000 mg | Freq: Four times a day (QID) | INTRAVENOUS | Status: DC | PRN
  Filled 2015-06-02: qty 5

## 2015-06-02 MED ORDER — POLYETHYLENE GLYCOL 3350 17 G PO PACK: 17.0000 g | PACK | Freq: Every day | ORAL | Status: DC | PRN

## 2015-06-02 MED ORDER — LIDOCAINE HCL (CARDIAC) 20 MG/ML IV SOLN
INTRAVENOUS | Status: AC
  Filled 2015-06-02: qty 5

## 2015-06-02 MED ORDER — CEFAZOLIN SODIUM-DEXTROSE 2-3 GM-% IV SOLR
INTRAVENOUS | Status: AC
  Filled 2015-06-02: qty 50

## 2015-06-02 MED ORDER — DEXAMETHASONE SODIUM PHOSPHATE 10 MG/ML IJ SOLN
INTRAMUSCULAR | Status: AC
  Filled 2015-06-02: qty 1

## 2015-06-02 MED ORDER — METHOCARBAMOL 500 MG PO TABS
500.0000 mg | ORAL_TABLET | Freq: Four times a day (QID) | ORAL | Status: DC | PRN
  Administered 2015-06-03 – 2015-06-04 (×4): 500 mg via ORAL
  Filled 2015-06-02 (×4): qty 1

## 2015-06-02 MED ORDER — FLEET ENEMA 7-19 GM/118ML RE ENEM: 1.0000 | ENEMA | Freq: Once | RECTAL | Status: DC | PRN

## 2015-06-02 MED ORDER — DOCUSATE SODIUM 100 MG PO CAPS
100.0000 mg | ORAL_CAPSULE | Freq: Two times a day (BID) | ORAL | Status: DC
  Administered 2015-06-02 – 2015-06-04 (×4): 100 mg via ORAL

## 2015-06-02 MED ORDER — FENTANYL CITRATE (PF) 100 MCG/2ML IJ SOLN: 25.0000 ug | INTRAMUSCULAR | Status: DC | PRN

## 2015-06-02 MED ORDER — LACTATED RINGERS IV SOLN
INTRAVENOUS | Status: DC | PRN
  Administered 2015-06-02 (×2): via INTRAVENOUS

## 2015-06-02 MED ORDER — METOCLOPRAMIDE HCL 10 MG PO TABS: 5.0000 mg | ORAL_TABLET | Freq: Three times a day (TID) | ORAL | Status: DC | PRN

## 2015-06-02 MED ORDER — CHLORHEXIDINE GLUCONATE 4 % EX LIQD: 60.0000 mL | Freq: Once | CUTANEOUS | Status: DC

## 2015-06-02 MED ORDER — CEFAZOLIN SODIUM-DEXTROSE 2-3 GM-% IV SOLR
2.0000 g | Freq: Four times a day (QID) | INTRAVENOUS | Status: AC
  Administered 2015-06-03 (×3): 2 g via INTRAVENOUS
  Filled 2015-06-02 (×3): qty 50

## 2015-06-02 MED ORDER — LIDOCAINE HCL (CARDIAC) 20 MG/ML IV SOLN
INTRAVENOUS | Status: DC | PRN
  Administered 2015-06-02: 50 mg via INTRAVENOUS

## 2015-06-02 MED ORDER — HYDROMORPHONE HCL 2 MG/ML IJ SOLN
INTRAMUSCULAR | Status: AC
  Filled 2015-06-02: qty 1

## 2015-06-02 MED ORDER — 0.9 % SODIUM CHLORIDE (POUR BTL) OPTIME
TOPICAL | Status: DC | PRN
  Administered 2015-06-02: 1000 mL

## 2015-06-02 MED ORDER — ASPIRIN 325 MG PO TABS
325.0000 mg | ORAL_TABLET | Freq: Every day | ORAL | Status: DC
  Administered 2015-06-03 – 2015-06-04 (×2): 325 mg via ORAL
  Filled 2015-06-02 (×2): qty 1

## 2015-06-02 MED ORDER — ESCITALOPRAM OXALATE 10 MG PO TABS
10.0000 mg | ORAL_TABLET | Freq: Every day | ORAL | Status: DC
  Administered 2015-06-02 – 2015-06-03 (×2): 10 mg via ORAL
  Filled 2015-06-02 (×3): qty 1

## 2015-06-02 MED ORDER — ACETAMINOPHEN 10 MG/ML IV SOLN
INTRAVENOUS | Status: AC
  Filled 2015-06-02: qty 100

## 2015-06-02 MED ORDER — METOCLOPRAMIDE HCL 5 MG/ML IJ SOLN: 5.0000 mg | Freq: Three times a day (TID) | INTRAMUSCULAR | Status: DC | PRN

## 2015-06-02 MED ORDER — PROPOFOL 10 MG/ML IV BOLUS
INTRAVENOUS | Status: DC | PRN
  Administered 2015-06-02: 180 mg via INTRAVENOUS

## 2015-06-02 SURGICAL SUPPLY — 39 items
BAG SPEC THK2 15X12 ZIP CLS (MISCELLANEOUS) ×1
BAG ZIPLOCK 12X15 (MISCELLANEOUS) ×2 IMPLANT
BANDAGE ELASTIC 6 VELCRO ST LF (GAUZE/BANDAGES/DRESSINGS) ×2 IMPLANT
BIT DRILL 2.8X5 QR DISP (BIT) ×2 IMPLANT
BLADE MIC 41X13 (BLADE) ×2 IMPLANT
CLOTH BEACON ORANGE TIMEOUT ST (SAFETY) ×2 IMPLANT
DRAPE U-SHAPE 47X51 STRL (DRAPES) ×2 IMPLANT
DRSG EMULSION OIL 3X3 NADH (GAUZE/BANDAGES/DRESSINGS) ×2 IMPLANT
DRSG PAD ABDOMINAL 8X10 ST (GAUZE/BANDAGES/DRESSINGS) ×2 IMPLANT
DURAPREP 26ML APPLICATOR (WOUND CARE) ×2 IMPLANT
ELECT REM PT RETURN 9FT ADLT (ELECTROSURGICAL) ×2
ELECTRODE REM PT RTRN 9FT ADLT (ELECTROSURGICAL) ×1 IMPLANT
GAUZE SPONGE 4X4 12PLY STRL (GAUZE/BANDAGES/DRESSINGS) ×2 IMPLANT
GLOVE BIO SURGEON STRL SZ7.5 (GLOVE) ×2 IMPLANT
GLOVE BIO SURGEON STRL SZ8 (GLOVE) ×2 IMPLANT
GLOVE BIOGEL PI IND STRL 8 (GLOVE) ×3 IMPLANT
GLOVE BIOGEL PI INDICATOR 8 (GLOVE) ×3
GOWN STRL REUS W/TWL LRG LVL3 (GOWN DISPOSABLE) ×2 IMPLANT
GOWN STRL REUS W/TWL XL LVL3 (GOWN DISPOSABLE) ×2 IMPLANT
IMMOBILIZER KNEE 20 (SOFTGOODS) ×2
IMMOBILIZER KNEE 20 THIGH 36 (SOFTGOODS) ×1 IMPLANT
MANIFOLD NEPTUNE II (INSTRUMENTS) ×2 IMPLANT
NDL MA TROC 1/2 CIR (NEEDLE) ×1 IMPLANT
NEEDLE MA TROC 1/2 CIR (NEEDLE) ×2 IMPLANT
NS IRRIG 1000ML POUR BTL (IV SOLUTION) ×2 IMPLANT
PACK ICE MAXI GEL EZY WRAP (MISCELLANEOUS) ×2 IMPLANT
PACK TOTAL KNEE CUSTOM (KITS) ×4 IMPLANT
PADDING CAST COTTON 6X4 STRL (CAST SUPPLIES) ×4 IMPLANT
PASSER SUT SWANSON 36MM LOOP (INSTRUMENTS) ×2 IMPLANT
POSITIONER SURGICAL ARM (MISCELLANEOUS) ×2 IMPLANT
STRIP CLOSURE SKIN 1/2X4 (GAUZE/BANDAGES/DRESSINGS) ×4 IMPLANT
SUT ETHIBOND 2 OS 4 DA (SUTURE) ×4 IMPLANT
SUT FIBERWIRE #2 38 T-5 BLUE (SUTURE) ×4
SUT MNCRL AB 4-0 PS2 18 (SUTURE) ×2 IMPLANT
SUT VIC AB 0 CT1 27 (SUTURE) ×4
SUT VIC AB 0 CT1 27XBRD ANTBC (SUTURE) ×2 IMPLANT
SUT VIC AB 2-0 CT1 27 (SUTURE) ×4
SUT VIC AB 2-0 CT1 TAPERPNT 27 (SUTURE) ×2 IMPLANT
SUTURE FIBERWR #2 38 T-5 BLUE (SUTURE) ×2 IMPLANT

## 2015-06-02 NOTE — Anesthesia Preprocedure Evaluation (Addendum)
Anesthesia Evaluation  Patient identified by MRN, date of birth, ID band Patient awake    Reviewed: Allergy & Precautions, H&P , NPO status , Patient's Chart, lab work & pertinent test results  Airway Mallampati: III  TM Distance: <3 FB Neck ROM: Full    Dental no notable dental hx. (+) Dental Advisory Given, Teeth Intact,    Pulmonary shortness of breath and with exertion, sleep apnea , former smoker,    Pulmonary exam normal breath sounds clear to auscultation       Cardiovascular hypertension, Pt. on medications and Pt. on home beta blockers + CAD and + Cardiac Stents  Normal cardiovascular exam Rhythm:Regular Rate:Normal     Neuro/Psych negative neurological ROS  negative psych ROS   GI/Hepatic negative GI ROS, Neg liver ROS,   Endo/Other  Hypothyroidism Morbid obesity  Renal/GU negative Renal ROS  negative genitourinary   Musculoskeletal negative musculoskeletal ROS (+)   Abdominal (+) + obese,   Peds negative pediatric ROS (+)  Hematology negative hematology ROS (+)   Anesthesia Other Findings   Reproductive/Obstetrics negative OB ROS                            Anesthesia Physical Anesthesia Plan  ASA: III  Anesthesia Plan: General   Post-op Pain Management:    Induction: Intravenous  Airway Management Planned: LMA  Additional Equipment:   Intra-op Plan:   Post-operative Plan:   Informed Consent:   Plan Discussed with: Surgeon  Anesthesia Plan Comments:         Anesthesia Quick Evaluation

## 2015-06-02 NOTE — H&P (Signed)
Andrea Norton is an 69 y.o. female.   Chief Complaint: Right knee extensor mechanism disruption HPI: 69 yo female with long complex history regarding her right knee. SHe has had multiple arthroplasty procedures and a recent irrigation and debridement. She has not been able to actively extend her knee since the last procedure. Radiograph last week showed patellar dislocation laterally. SHe presents now for extensor mechanism reconstruction  Past Medical History  Diagnosis Date  . Hyperlipidemia   . Status post primary angioplasty with coronary stent     DRUG-ELUTING STENT X1  TO PROXIMAL LAD  . Chronic back pain   . Chronic neck pain   . Arthritis     HANDS, KNEES  . DDD (degenerative disc disease), cervical   . DDD (degenerative disc disease), lumbar   . Radicular pain in left arm OCCASIONAL PAIN SHOOTING DOWN LEFT ARM SECAONDARY TO  CERVICAL DEGENERATION  . Hemorrhoid   . Diverticulitis of colon   . B12 deficiency anemia   . Vitamin D deficiency   . Hypothyroidism   . Synovitis of knee HYPERTROPHIC RIGHT KNEE  . Abnormal stress test 01-22-2009    LOW RISK ADENOSINE NUCLEAR STUDY W/ PROBABLE MILD APICAL THINNING BUT NO ISCHEMIA  . Depression     states well controlled  . GERD (gastroesophageal reflux disease)   . Inguinal hernia, right     with ventral hernia  . Hypertension       PCP Dr Lysbeth Galas    . Hemorrhoids   . Coronary artery disease 2005    STRESS TEST /, NOte Dr Kirtland Bouchard EPIC/ no cardiologist now--STATES HEART STENT PLACED 2010  . Sleep apnea     couldnt tolerate machine- states last sleep study " many years ago"  . Bronchitis     hx of   . H/O hiatal hernia   . Pneumonia     hx of walking pneumonia    Past Surgical History  Procedure Laterality Date  . Knee closed reduction  05-07-2009  RIGHT KNEE    LEFT KNEE  10-01-2009  . Total knee arthroplasty  LEFT  2005    RIGHT  03-13-2009  . Total knee revision  RIGHT 09-30-2009    LEFT 11-25-2009  . Hysteroscopy  w/d&c  2002  . Open patellofemoral right knee/ lateral release  05-04-2009  . Knee arthroscopy w/ debridement  01-22-2010    SEPTIC KNEE  . Right knee i & d polythylene revision  02-04-2010    KNEE INFECTED  . Left foot surg.  1990  . Appendectomy  1993  . Knee arthroscopy  LEFT 1999    RIGHT 2004  . Hemicolectomy for diverticulitis  2005  . Knee arthroscopy  08/03/2011    Procedure: ARTHROSCOPY KNEE;  Surgeon: Loanne Drilling;  Location: [facility];  Service: Orthopedics;  Laterality: Right;  . Synovectomy  08/03/2011    Procedure: SYNOVECTOMY;  Surgeon: Loanne Drilling;  Location: [facility];  Service: Orthopedics;  Laterality: Right;  . I&d knee with poly exchange  02/01/2012    Procedure: IRRIGATION AND DEBRIDEMENT KNEE WITH POLY EXCHANGE;  Surgeon: Loanne Drilling, MD;  Location: [facility];  Service: Orthopedics;  Laterality: Right;  Right Knee Polyethlene Revision   . Colon surgery    . Total knee revision Right 02/20/2013    Procedure: RIGHT TOTAL KNEE ARTHROPLASTY REVISION VERSUS RESECTION ARTHROPLASTY;  Surgeon: Loanne Drilling, MD;  Location: [facility];  Service: Orthopedics;  Laterality: Right;  .  Joint replacement      right knee   . Coronary angioplasty  2004    DRUG-ELUTING STENT X1 TO PROXIMAL  LAD  . Cataract extraction w/phaco Right 05/06/2014    Procedure: CATARACT EXTRACTION PHACO AND INTRAOCULAR LENS PLACEMENT (IOC);  Surgeon: Loraine LericheMark T. Nile RiggsShapiro, MD;  Location: [facility];  Service: Ophthalmology;  Laterality: Right;  CDE 6.41  . Total knee revision Right 04/02/2014    Procedure: RIGHT KNEE FEMORAL ARTHROPLASTY REVISION;  Surgeon: Loanne DrillingFrank Tu Shimmel V, MD;  Location: [facility];  Service: Orthopedics;  Laterality: Right;  . Cataract extraction w/phaco Left 05/20/2014    Procedure: CATARACT EXTRACTION PHACO AND INTRAOCULAR LENS PLACEMENT (IOC);  Surgeon: Loraine LericheMark T. Nile RiggsShapiro, MD;  Location: [facility];  Service: Ophthalmology;  Laterality: Left;  CDE:4.42  . Laparoscopic  gastric sleeve resection N/A 07/21/2014    Procedure: LAPAROSCOPIC GASTRIC SLEEVE RESECTION, TAKEDOWN OF INCARCERATED VENTRAL HERNIA, ENTERAL LYSIS, UPPER ENDOSCOPY;  Surgeon: Valarie MerinoMatthew B Martin, MD;  Location: [facility];  Service: General;  Laterality: N/A;  . Knee arthroscopy Right 12/17/2014    Procedure: RIGHT ARTHROSCOPY KNEE, SYNOVECTOMY;  Surgeon: Ollen GrossFrank Cal Gindlesperger, MD;  Location: [facility];  Service: Orthopedics;  Laterality: Right;  . Irrigation and debridement knee Right 03/10/2015    Procedure: IRRIGATION AND DEBRIDEMENT of right KNEE;  Surgeon: Ollen GrossFrank Jan Olano, MD;  Location: [facility];  Service: Orthopedics;  Laterality: Right;    Family History  Problem Relation Age of Onset  . COPD Mother   . Cancer Father     lung   Social History:  reports that she quit smoking about 12 years ago. Her smoking use included Cigarettes. She quit after 39 years of use. She has never used smokeless tobacco. She reports that she does not drink alcohol or use illicit drugs.  Allergies: No Known Allergies  No prescriptions prior to admission    No results found for this or any previous visit (from the past 48 hour(s)). No results found.  ROS  There were no vitals taken for this visit. Physical Exam Physical Examination: General appearance - alert, well appearing, and in no distress Mental status - alert, oriented to person, place, and time Chest - clear to auscultation, no wheezes, rales or rhonchi, symmetric air entry Heart - normal rate, regular rhythm, normal S1, S2, no murmurs, rubs, clicks or gallops Abdomen - soft, nontender, nondistended, no masses or organomegaly Neurological - alert, oriented, normal speech, no focal findings or movement disorder noted Right knee- no effusion. Unable to actively extend knee but has full extension passively.   Assessment/Plan Right knee extensor mechanism instability- Plan right knee extensor mechanism reconstruction. Discussed in detail with patient who elects to  proceed.  Loanne DrillingALUISIO,Sofia Vanmeter V 06/02/2015, 3:40 PM

## 2015-06-02 NOTE — Brief Op Note (Signed)
06/02/2015  8:13 PM  PATIENT:  Andrea Norton  69 y.o. female  PRE-OPERATIVE DIAGNOSIS:  RIGHT KNEE QUADRICEP TEAR  POST-OPERATIVE DIAGNOSIS:  RIGHT KNEE QUADRICEP TEAR  PROCEDURE:  Procedure(s): RIGHT KNEE QUADRICEP TENDON REPAIR  (Right)  SURGEON:  Surgeon(s) and Role:    * Ollen GrossFrank Bernell Haynie, MD - Primary  PHYSICIAN ASSISTANT:   ASSISTANTS: Avel Peacerew Perkins, PA-C   ANESTHESIA:   general  EBL:     DRAINS: (Medium) Hemovact drain(s) in the right knee with  Suction Open   LOCAL MEDICATIONS USED:  NONE  COUNTS:  YES  TOURNIQUET:  18 minutes @ 300 mm Hg  DICTATION: .Other Dictation: Dictation Number [id number]  PLAN OF CARE: Admit to inpatient   PATIENT DISPOSITION:  PACU - hemodynamically stable.

## 2015-06-02 NOTE — Interval H&P Note (Signed)
History and Physical Interval Note:  06/02/2015 6:22 PM  Andrea NgGloria O Stenzel  has presented today for surgery, with the diagnosis of RIGHT KNEE QUADRICEP TEAR  The various methods of treatment have been discussed with the patient and family. After consideration of risks, benefits and other options for treatment, the patient has consented to  Procedure(s): RIGHT KNEE QUADRICEP TENDON REPAIR  (Right) as a surgical intervention .  The patient's history has been reviewed, patient examined, no change in status, stable for surgery.  I have reviewed the patient's chart and labs.  Questions were answered to the patient's satisfaction.     Loanne DrillingALUISIO,Emilie Carp V

## 2015-06-02 NOTE — Anesthesia Procedure Notes (Signed)
Procedure Name: LMA Insertion Date/Time: 06/02/2015 7:23 PM Performed by: Enriqueta ShutterWILLIFORD, Teresa Nicodemus D Pre-anesthesia Checklist: Patient identified, Emergency Drugs available, Suction available and Patient being monitored Patient Re-evaluated:Patient Re-evaluated prior to inductionOxygen Delivery Method: Circle System Utilized Preoxygenation: Pre-oxygenation with 100% oxygen Intubation Type: IV induction Ventilation: Mask ventilation without difficulty LMA Size: 4.0 Tube type: Oral Number of attempts: 1 Placement Confirmation: positive ETCO2 and breath sounds checked- equal and bilateral Tube secured with: Tape Dental Injury: Teeth and Oropharynx as per pre-operative assessment

## 2015-06-02 NOTE — Transfer of Care (Signed)
Immediate Anesthesia Transfer of Care Note  Patient: Andrea Norton  Procedure(s) Performed: Procedure(s): RIGHT KNEE QUADRICEP TENDON REPAIR  (Right)  Patient Location: PACU  Anesthesia Type:General  Level of Consciousness: awake, alert  and oriented  Airway & Oxygen Therapy: Patient Spontanous Breathing and Patient connected to face mask oxygen  Post-op Assessment: Report given to RN and Post -op Vital signs reviewed and stable  Post vital signs: Reviewed and stable  Last Vitals:  Filed Vitals:   06/02/15 1553  BP: 124/74  Pulse: 71  Temp: 37.2 C  Resp: 16    Complications: No apparent anesthesia complications

## 2015-06-03 ENCOUNTER — Encounter (HOSPITAL_COMMUNITY): Payer: Self-pay | Admitting: Orthopedic Surgery

## 2015-06-03 DIAGNOSIS — S76111A Strain of right quadriceps muscle, fascia and tendon, initial encounter: Secondary | ICD-10-CM | POA: Diagnosis not present

## 2015-06-03 NOTE — Progress Notes (Signed)
11.09/16  0820  Patient refused to allow the CNA to help with taking her to bathroom. Patient states her husband will help her to the bathroom. Pt advised that we should be assisting, but pt still refused.

## 2015-06-03 NOTE — Progress Notes (Signed)
Subjective: 1 Day Post-Op Procedure(s) (LRB): RIGHT KNEE QUADRICEP TENDON REPAIR  (Right) Patient reports pain as mild.   Patient seen in rounds by Dr. Lequita Halt. Patient is well, but has had some minor complaints of pain in the knee, requiring pain medications We will start therapy today. She can be up with PT but no bending or motion to the knee. Plan is to go Home after hospital stay.  Objective: Vital signs in last 24 hours: Temp:  [97.5 F (36.4 C)-98.9 F (37.2 C)] 97.5 F (36.4 C) (11/09 0500) Pulse Rate:  [63-77] 74 (11/09 1233) Resp:  [11-16] 16 (11/09 0500) BP: (83-131)/(43-92) 83/48 mmHg (11/09 1233) SpO2:  [94 %-100 %] 97 % (11/09 0500) Weight:  [103.42 kg (228 lb)] 103.42 kg (228 lb) (11/08 1553)  Intake/Output from previous day: 11/08 0701 - 11/09 0700 In: 2135 [P.O.:480; I.V.:1555; IV Piggyback:100] Out: 440 [Urine:350; Drains:90] Intake/Output this shift: Total I/O In: 885 [P.O.:360; I.V.:525] Out: -   No results for input(s): HGB in the last 72 hours. No results for input(s): WBC, RBC, HCT, PLT in the last 72 hours. No results for input(s): NA, K, CL, CO2, BUN, CREATININE, GLUCOSE, CALCIUM in the last 72 hours. No results for input(s): LABPT, INR in the last 72 hours.  EXAM General - Patient is Alert, Appropriate and Oriented Extremity - Neurovascular intact Sensation intact distally Dorsiflexion/Plantar flexion intact Dressing - dressing C/D/I Motor Function - intact, moving foot and toes well on exam.  Drains have been left in place.  Past Medical History  Diagnosis Date  . Hyperlipidemia   . Status post primary angioplasty with coronary stent     DRUG-ELUTING STENT X1  TO PROXIMAL LAD  . Chronic back pain   . Chronic neck pain   . Arthritis     HANDS, KNEES  . DDD (degenerative disc disease), cervical   . DDD (degenerative disc disease), lumbar   . Radicular pain in left arm OCCASIONAL PAIN SHOOTING DOWN LEFT ARM SECAONDARY TO  CERVICAL  DEGENERATION  . Hemorrhoid   . Diverticulitis of colon   . B12 deficiency anemia   . Vitamin D deficiency   . Hypothyroidism   . Synovitis of knee HYPERTROPHIC RIGHT KNEE  . Abnormal stress test 01-22-2009    LOW RISK ADENOSINE NUCLEAR STUDY W/ PROBABLE MILD APICAL THINNING BUT NO ISCHEMIA  . Depression     states well controlled  . GERD (gastroesophageal reflux disease)   . Inguinal hernia, right     with ventral hernia  . Hypertension       PCP Dr Lysbeth Galas    . Hemorrhoids   . Coronary artery disease 2005    STRESS TEST /, NOte Dr Kirtland Bouchard EPIC/ no cardiologist now--STATES HEART STENT PLACED 2010  . Sleep apnea     couldnt tolerate machine- states last sleep study " many years ago"  . Bronchitis     hx of   . H/O hiatal hernia   . Pneumonia     hx of walking pneumonia    Assessment/Plan: 1 Day Post-Op Procedure(s) (LRB): RIGHT KNEE QUADRICEP TENDON REPAIR  (Right) Active Problems:   Quadriceps tendon rupture  Estimated body mass index is 38.55 kg/(m^2) as calculated from the following:   Height as of this encounter: 5' 4.5" (1.638 m).   Weight as of this encounter: 103.42 kg (228 lb). Advance diet Up with therapy Plan for discharge tomorrow  DVT Prophylaxis - Aspirin 325 mg Weight-Bearing as tolerated to right leg  but no bending or motion to the knee. D/C O2 and Pulse OX and try on Room Air  Avel Peacerew Rodney Wigger, [place]PA-C Orthopaedic Surgery 06/03/2015, 1:07 PM

## 2015-06-03 NOTE — Care Management Obs Status (Signed)
MEDICARE OBSERVATION STATUS NOTIFICATION   Patient Details  Name: Andrea Norton MRN: [id number] Date of Birth: 09/12/1945   Medicare Observation Status Notification Given:  Yes C44 given to pt; copy to shadow chart; copy to CM file for audit trail.   Yves DillJeffries, Andrea Norton Christine, RN 06/03/2015, 4:32 PM

## 2015-06-03 NOTE — Op Note (Signed)
NAMEBRYLYN, Andrea Norton NO.:  [id number]  MEDICAL RECORD NO.:  [id number]  LOCATION:  1613                         FACILITY:  [facility]  PHYSICIAN:  Ollen Gross, M.D.    DATE OF BIRTH:  Apr 20, 1946  DATE OF PROCEDURE:  06/02/2015 DATE OF DISCHARGE:                              OPERATIVE REPORT   PREOPERATIVE DIAGNOSIS:  Right knee quadriceps/extensor mechanism tear.  POSTOPERATIVE DIAGNOSIS:  Right knee quadriceps/extensor mechanism tear.  PROCEDURE:  Right knee quadriceps tendon repair.  SURGEON:  Ollen Gross, M.D.  ASSISTANT:  Alexzandrew L. Perkins, P.A.C.  ANESTHESIA:  General.  ESTIMATED BLOOD LOSS:  Minimal.  DRAINS:  Hemovac x1.  TOURNIQUET TIME:  18 minutes at 300 mmHg.  COMPLICATIONS:  None.  CONDITION:  Stable to recovery.  BRIEF CLINICAL NOTE:  Ms. Ahmed is a 69 year old female, very long complex history in regard to her right knee.  She had a recent irrigation and debridement procedure performed and has had difficulty with extending the knee.  Recent x-ray showed that she had subluxation/dislocation of the patella.  It was felt that she had disrupted her extensor mechanism repair.  She presents now for extensor repair.  PROCEDURE IN DETAIL:  After successful administration of general anesthetic, a tourniquet was placed high on the right thigh and right lower extremity was prepped and draped in the usual sterile fashion. Extremities wrapped in Esmarch, tourniquet inflated to 300 mmHg. Midline incision was made with a 10-blade through the subcutaneous tissue to the extensor mechanism.  Fresh blade was used to make an arthrotomy.  She had a fair amount of synovial fluid present.  It was clear fluid, did not have any consistency of infection.  She had significant attenuation of the extensor mechanism starting at above the patella and coursing and laid down distally.  The tissue was rather redundant.  I excised about a centimeter of tissue  off the medial part of the extensor mechanism.  We then did a scar debridement and the patella mobilized much better.  I was then able to repair it and it did move over more centrally.  She had the patella in a [place] position due to the multiple revision surgeries.  However, had been subluxed laterally and we opened the knee and I was able to do a lateral release and then mobilized it over medially.  The wound was copiously irrigated with a liter of saline solution and then we repaired the medial arthrotomy with interrupted #1 PDS suture.  I flexed the knee and then flexed over 90 degrees and the patella was tracking centrally.  The tourniquet was then released with total tourniquet time of 18 minutes.  Hemovac drain had been placed prior to closing the knee.  Minor bleeding was stopped with cautery.  Subcu was closed with interrupted 2-0 Vicryl and skin with staples.  Drains hooked to suction.  Incision was cleaned and dried and a bulky sterile dressing applied.  She was placed into a knee immobilizer, awakened and transported to recovery in stable condition. Note that a surgical assistant was necessary for this procedure to help repair this complex extensor mechanism dilemma.     Ollen Gross, M.D.  FA/MEDQ  D:  06/02/2015  T:  06/03/2015  Job:  [id number]

## 2015-06-03 NOTE — Evaluation (Addendum)
Physical Therapy Evaluation Patient Details Name: Andrea Norton MRN: [id number] DOB: Feb 15, 1946 Today's Date: 06/03/2015   History of Present Illness  69 y.o. female with a past medical history of multiple bilateral knee surgeries (R TKA resection 2014, revision2015), hypertension, hypothyroidism, hyperlipidemia, coronary artery disease, depression, GERD, RLE cellulitis, septic arthritis.  s/p I &D 03/10/15.  Admitted with R patella dislocation and quad tendon rupture. s/p quad tendon repair.   Clinical Impression  Pt is modified independent with mobility. She walked 300' with RW independently and demonstrates understanding of HEP. She reports she's independent with stairs using RW. No further acute PT indicated. MD, please order outpt PT vs. HHPT once she's able to flex R knee.  Encouraged pt to ambulate 3x/day while in hospital. PT signing off.     Follow Up Recommendations No PT follow up    Equipment Recommendations  None recommended by PT    Recommendations for Other Services       Precautions / Restrictions Precautions Precaution Comments: no flexion R knee Required Braces or Orthoses: Knee Immobilizer - Right Knee Immobilizer - Right: On at all times      Mobility  Bed Mobility Overal bed mobility: Modified Independent             General bed mobility comments: self assisted RLE OOB  Transfers Overall transfer level: Modified independent Equipment used: Rolling walker (2 wheeled)                Ambulation/Gait Ambulation/Gait assistance: Modified independent (Device/Increase time) Ambulation Distance (Feet): 300 Feet Assistive device: Rolling walker (2 wheeled) Gait Pattern/deviations: Step-through pattern   Gait velocity interpretation: at or above normal speed for age/gender General Gait Details: steady with RW, no LOB, good sequencing  Stairs            Wheelchair Mobility    Modified Rankin (Stroke Patients Only)       Balance Overall  balance assessment: Modified Independent                                           Pertinent Vitals/Pain Pain Assessment: 0-10 Pain Score: 4  Pain Location: R knee with walking Pain Descriptors / Indicators: Sore Pain Intervention(s): Limited activity within patient's tolerance;Monitored during session;Premedicated before session;Ice applied    Home Living Family/patient expects to be discharged to:: Private residence Living Arrangements: Spouse/significant other Available Help at Discharge: Family;Available 24 hours/day   Home Access: Stairs to enter Entrance Stairs-Rails: Right;Left Entrance Stairs-Number of Steps: 4 Home Layout: One level Home Equipment: Walker - 2 wheels;Cane - single point;Walker - 4 wheels;Bedside commode;Shower seat      Prior Function Level of Independence: Independent with assistive device(s)         Comments: independent bathing, husband helps dress lower body; used RW PTA     Hand Dominance        Extremity/Trunk Assessment   Upper Extremity Assessment: Overall WFL for tasks assessed           Lower Extremity Assessment: RLE deficits/detail RLE Deficits / Details: SLR +2/5, ankle WNL, knee NT     Cervical / Trunk Assessment: Normal  Communication   Communication: No difficulties  Cognition Arousal/Alertness: Awake/alert Behavior During Therapy: WFL for tasks assessed/performed Overall Cognitive Status: Within Functional Limits for tasks assessed  General Comments      Exercises Total Joint Exercises Ankle Circles/Pumps: AROM;Both;10 reps;Supine Quad Sets: AROM;Right;10 reps;Supine Hip ABduction/ADduction: AAROM;Right;10 reps;Supine Straight Leg Raises: AAROM;Right;10 reps;Supine      Assessment/Plan    PT Assessment Patent does not need any further PT services  PT Diagnosis     PT Problem List    PT Treatment Interventions     PT Goals (Current goals can be found in  the Care Plan section) Acute Rehab PT Goals Patient Stated Goal: to walk without a device, be able to cook and grocery shop PT Goal Formulation: All assessment and education complete, DC therapy    Frequency     Barriers to discharge        Co-evaluation               End of Session Equipment Utilized During Treatment: Gait belt;Right knee immobilizer Activity Tolerance: Patient tolerated treatment well Patient left: in chair;with call bell/phone within reach;with family/visitor present Nurse Communication: Mobility status         Time: 0920-0955 PT Time Calculation (min) (ACUTE ONLY): 35 min   Charges:   PT Evaluation $Initial PT Evaluation Tier I: 1 Procedure PT Treatments $Gait Training: 8-22 mins   PT G Codes:     PT G-Codes **NOT FOR INPATIENT CLASS** Functional Assessment Tool Used clinical judgement clinical judgement at 1007 on 06/03/15 by Alvester Morin, PT Functional Limitation Mobility: Walking and moving around Mobility: Walking and moving around at 1007 on 06/03/15 by Alvester Morin, PT Mobility: Walking and Moving Around Current Status [phone]) At least 1 percent but less than 20 percent impaired, limited or restricted CI at 1007 on 06/03/15 by Alvester Morin, PT Mobility: Walking and Moving Around Goal Status [phone]) At least 1 percent but less than 20 percent impaired, limited or restricted CI at 1007 on 06/03/15 by Alvester Morin, PT Mobility: Walking and Moving Around Discharge Status [phone]) At least 1 percent but less than 20 percent impaired, limited or restricted CI at 1007 on 06/03/15 by Alvester Morin, PT     Ralene Bathe Kistler 06/03/2015, 10:07 AM [phone]

## 2015-06-04 DIAGNOSIS — S76111A Strain of right quadriceps muscle, fascia and tendon, initial encounter: Secondary | ICD-10-CM | POA: Diagnosis not present

## 2015-06-04 MED ORDER — METHOCARBAMOL 500 MG PO TABS: 500.0000 mg | ORAL_TABLET | Freq: Four times a day (QID) | ORAL | Status: DC | PRN

## 2015-06-04 MED ORDER — HYDROMORPHONE HCL 2 MG PO TABS: 2.0000 mg | ORAL_TABLET | ORAL | Status: DC | PRN

## 2015-06-04 NOTE — Discharge Instructions (Addendum)
Quadriceps Tendon Tear or Disruption With Rehab    The quadriceps muscles are located on the front of the thigh and are responsible for straightening the knee and bending the hip. The quadriceps tendon connects these muscles to the kneecap (patella) and also from the patella to a portion of the shin bone (tibial tubercle). A quadriceps tendon tear or disruption is characterized by a partial or complete tear of the quadriceps tendon between the quadriceps muscles and the patella. Quadriceps tendon tears or disruptions often cause pain above the knee and result in a decrease in function of the quadriceps muscles.  SYMPTOMS  A "pop" or tear felt or heard above the patella at the time of injury.  Pain, tenderness, inflammation, and/or bruising over the quadriceps tendon.  Pain that worsens with use of the quadriceps muscles.  Difficulty with common tasks that involve the quadriceps muscle, such as walking.  A crackling sound (crepitation) when the tendon is moved or touched.  Loss of fullness of the muscle or bulging within the area of muscle with complete rupture. CAUSES  A strain occurs when a force is placed on the muscle or tendon that is greater than it can withstand. Common mechanisms of injury include:  Repetitive strenuous use of the quadriceps muscles. This may be due to an increase in the intensity, frequency, or duration of exercise.  Direct trauma to the quadriceps muscles or tendons. RISK INCREASES WITH:  Activities that involve forceful contractions of the quadriceps muscles (jumping or sprinting).  Contact sports (soccer or football).  Poor strength and flexibility.  Failure to warm-up properly before activity.  Previous injury to the thigh or knee.  Untreated quadriceps tendinitis.  Corticosteroid injections into the quadriceps tendon. PREVENTION  Warm up and stretch properly before activity.  Allow for adequate recovery between workouts.  Maintain physical fitness:  Strength,  flexibility, and endurance.  Cardiovascular fitness. Wear properly fitted and padded protective equipment. PROGNOSIS  If treated properly, then recovery from a quadriceps tendon tear or disruption usually occurs; however the recovery period may b 6 to 9 months.  RELATED COMPLICATIONS  Quadriceps muscle weakness.  Re-rupture of the tendon after treatment.  Prolonged healing time, if improperly treated or re-injured.  Risks of surgery: infection, bleeding, nerve damage, or damage to surrounding tissues. TREATMENT  Initial treatment involves rest from any activities that aggravate the symptoms. Ice, medication, and elevation may be used to help reduce pain and inflammation. The use of strengthening and stretching exercises may help reduce pain with activity. These exercises may be performed at home or with referral to a therapist. If the tear is complete, then surgery is usually required to repair the tendon, as it cannot heal on its own. After surgery immobilization is required to allow for healing. After immobilization it is important to perform strengthening and stretching exercises to help regain strength and a full range of motion.  MEDICATION  If pain medication is necessary, then nonsteroidal anti-inflammatory medications, such as aspirin and ibuprofen, or other minor pain relievers, such as acetaminophen, are often recommended.  Do not take pain medication for 7 days before surgery.  Prescription pain relievers may be given if deemed necessary by your caregiver. Use only as directed and only as much as you need. COLD THERAPY  Cold treatment (icing) relieves pain and reduces inflammation. Cold treatment should be applied for 10 to 15 minutes every 2 to 3 hours for inflammation and pain and immediately after any activity that aggravates your symptoms. Use ice  packs or massage the area with a piece of ice (ice massage).  SEEK MEDICAL CARE IF:  Treatment seems to offer no benefit, or the  condition worsens.  Any medications produce adverse side effects.  Any complications from surgery occur:  Pain, numbness, or coldness in the extremity operated upon.  Discoloration of the nail beds (they become blue or gray) of the extremity operated upon.  Signs of infections (fever, pain, inflammation, redness, or persistent bleeding).  Pick up stool softner and laxative for home use following surgery while on pain medications. Do not submerge incision under water. Please use good hand washing techniques while changing dressing each day. May shower starting three days after surgery. Please use a clean towel to pat the incision dry following showers. Continue to use ice for pain and swelling after surgery. Do not use any lotions or creams on the incision until instructed by your surgeon.  Resume the Aspirin 325 mg daily at home.  Knee Immobilizer to right knee at all times. No bending or range of motion to the right knee.  Postoperative Constipation Protocol  Constipation - defined medically as fewer than three stools per week and severe constipation as less than one stool per week.  One of the most common issues patients have following surgery is constipation. Even if you have a regular bowel pattern at home, your normal regimen is likely to be disrupted due to multiple reasons following surgery. Combination of anesthesia, postoperative narcotics, change in appetite and fluid intake all can affect your bowels. In order to avoid complications following surgery, here are some recommendations in order to help you during your recovery period.  Colace (docusate) - Pick up an over-the-counter form of Colace or another stool softener and take twice a day as long as you are requiring postoperative pain medications. Take with a full glass of water daily. If you experience loose stools or diarrhea, hold the colace until you stool forms back up. If your symptoms do not get better within 1 week  or if they get worse, check with your doctor.  Dulcolax (bisacodyl) - Pick up over-the-counter and take as directed by the product packaging as needed to assist with the movement of your bowels. Take with a full glass of water. Use this product as needed if not relieved by Colace only.   MiraLax (polyethylene glycol) - Pick up over-the-counter to have on hand. MiraLax is a solution that will increase the amount of water in your bowels to assist with bowel movements. Take as directed and can mix with a glass of water, juice, soda, coffee, or tea. Take if you go more than two days without a movement. Do not use MiraLax more than once per day. Call your doctor if you are still constipated or irregular after using this medication for 7 days in a row.  If you continue to have problems with postoperative constipation, please contact the office for further assistance and recommendations. If you experience "the worst abdominal pain ever" or develop nausea or vomiting, please contact the office immediatly for further recommendations for treatment.

## 2015-06-04 NOTE — Plan of Care (Signed)
Problem: Skin Integrity: Goal: Risk for impaired skin integrity will decrease Outcome: Completed/Met Date Met:  06/04/15 Independent  Problem: Activity: Goal: Risk for activity intolerance will decrease Outcome: Completed/Met Date Met:  06/04/15 OOB w/ PT     

## 2015-06-04 NOTE — Progress Notes (Signed)
Subjective: 2 Days Post-Op Procedure(s) (LRB): RIGHT KNEE QUADRICEP TENDON REPAIR  (Right) Patient reports pain as mild.   Patient seen in rounds by Dr. Lequita Halt. Patient is well, and has had no acute complaints or problems Patient is ready to go home. Will not need HHPT.  Objective: Vital signs in last 24 hours: Temp:  [97.6 F (36.4 C)-98.1 F (36.7 C)] 97.6 F (36.4 C) (11/10 0534) Pulse Rate:  [62-74] 62 (11/10 0534) Resp:  [16-17] 16 (11/10 0534) BP: (83-121)/(43-66) 121/66 mmHg (11/10 0534) SpO2:  [95 %-96 %] 96 % (11/10 0534)  Intake/Output from previous day:  Intake/Output Summary (Last 24 hours) at 06/04/15 0903 Last data filed at 06/04/15 0821  Gross per 24 hour  Intake 2038.83 ml  Output    900 ml  Net 1138.83 ml    Intake/Output this shift: Total I/O In: 240 [P.O.:240] Out: -   Labs: No results for input(s): HGB in the last 72 hours. No results for input(s): WBC, RBC, HCT, PLT in the last 72 hours. No results for input(s): NA, K, CL, CO2, BUN, CREATININE, GLUCOSE, CALCIUM in the last 72 hours. No results for input(s): LABPT, INR in the last 72 hours.  EXAM: General - Patient is Alert, Appropriate and Oriented Extremity - Neurovascular intact Sensation intact distally Dorsiflexion/Plantar flexion intact Incision - clean, dry, no drainage Motor Function - intact, moving foot and toes well on exam.   Assessment/Plan: 2 Days Post-Op Procedure(s) (LRB): RIGHT KNEE QUADRICEP TENDON REPAIR  (Right) Procedure(s) (LRB): RIGHT KNEE QUADRICEP TENDON REPAIR  (Right) Past Medical History  Diagnosis Date  . Hyperlipidemia   . Status post primary angioplasty with coronary stent     DRUG-ELUTING STENT X1  TO PROXIMAL LAD  . Chronic back pain   . Chronic neck pain   . Arthritis     HANDS, KNEES  . DDD (degenerative disc disease), cervical   . DDD (degenerative disc disease), lumbar   . Radicular pain in left arm OCCASIONAL PAIN SHOOTING DOWN LEFT ARM  SECAONDARY TO  CERVICAL DEGENERATION  . Hemorrhoid   . Diverticulitis of colon   . B12 deficiency anemia   . Vitamin D deficiency   . Hypothyroidism   . Synovitis of knee HYPERTROPHIC RIGHT KNEE  . Abnormal stress test 01-22-2009    LOW RISK ADENOSINE NUCLEAR STUDY W/ PROBABLE MILD APICAL THINNING BUT NO ISCHEMIA  . Depression     states well controlled  . GERD (gastroesophageal reflux disease)   . Inguinal hernia, right     with ventral hernia  . Hypertension       PCP Dr Lysbeth Galas    . Hemorrhoids   . Coronary artery disease 2005    STRESS TEST /, NOte Dr Kirtland Bouchard EPIC/ no cardiologist now--STATES HEART STENT PLACED 2010  . Sleep apnea     couldnt tolerate machine- states last sleep study " many years ago"  . Bronchitis     hx of   . H/O hiatal hernia   . Pneumonia     hx of walking pneumonia   Active Problems:   Quadriceps tendon rupture  Estimated body mass index is 38.55 kg/(m^2) as calculated from the following:   Height as of this encounter: 5' 4.5" (1.638 m).   Weight as of this encounter: 103.42 kg (228 lb). Discharge home with home health Diet - Regular diet and Cardiac diet Follow up - in 2 weeks Activity - WBAT Disposition - Home Condition Upon Discharge - Good D/C  Meds - See DC Summary DVT Prophylaxis - Aspirin  Avel Peacerew Perkins, PA-C Orthopaedic Surgery 06/04/2015, 9:04 AM

## 2015-06-04 NOTE — Discharge Summary (Signed)
Physician Discharge Summary   Patient ID: Andrea Norton MRN: [id number] DOB/AGE: [birth year] 69 y.o.  Admit date: 06/02/2015 Discharge date: 06-04-2015  Primary Diagnosis:  Right knee quadriceps/extensor mechanism tear.  Admission Diagnoses:  Past Medical History  Diagnosis Date  . Hyperlipidemia   . Status post primary angioplasty with coronary stent     DRUG-ELUTING STENT X1  TO PROXIMAL LAD  . Chronic back pain   . Chronic neck pain   . Arthritis     HANDS, KNEES  . DDD (degenerative disc disease), cervical   . DDD (degenerative disc disease), lumbar   . Radicular pain in left arm OCCASIONAL PAIN SHOOTING DOWN LEFT ARM SECAONDARY TO  CERVICAL DEGENERATION  . Hemorrhoid   . Diverticulitis of colon   . B12 deficiency anemia   . Vitamin D deficiency   . Hypothyroidism   . Synovitis of knee HYPERTROPHIC RIGHT KNEE  . Abnormal stress test 01-22-2009    LOW RISK ADENOSINE NUCLEAR STUDY W/ PROBABLE MILD APICAL THINNING BUT NO ISCHEMIA  . Depression     states well controlled  . GERD (gastroesophageal reflux disease)   . Inguinal hernia, right     with ventral hernia  . Hypertension       PCP Dr Edrick Oh    . Hemorrhoids   . Coronary artery disease 2005    STRESS TEST /, NOte Dr Jenkins [place] EPIC/ no cardiologist now--STATES [place] 2010  . Sleep apnea     couldnt tolerate machine- states last sleep study " many years ago"  . Bronchitis     hx of   . H/O hiatal hernia   . Pneumonia     hx of walking pneumonia   Discharge Diagnoses:   Active Problems:   Quadriceps tendon rupture  Estimated body mass index is 38.55 kg/(m^2) as calculated from the following:   Height as of this encounter: 5' 4.5" (1.638 m).   Weight as of this encounter: 103.42 kg (228 lb).  Procedure(s) (LRB): RIGHT KNEE QUADRICEP TENDON REPAIR  (Right)   Consults: None  HPI: [name] is a 69 year old female, very long complex history in regard to her right knee. She had a  recent irrigation and debridement procedure performed and has had difficulty with extending the knee. Recent x-ray showed that she had subluxation/dislocation of the patella. It was felt that she had disrupted her extensor mechanism repair. She presents now for extensor Repair.  Laboratory Data: Hospital Outpatient Visit on 05/28/2015  Component Date Value Ref Range Status  . WBC 05/28/2015 7.4  4.0 - 10.5 K/uL Final  . RBC 05/28/2015 4.32  3.87 - 5.11 MIL/uL Final  . Hemoglobin 05/28/2015 12.5  12.0 - 15.0 g/dL Final  . HCT 05/28/2015 39.1  36.0 - 46.0 % Final  . MCV 05/28/2015 90.5  78.0 - 100.0 fL Final  . MCH 05/28/2015 28.9  26.0 - 34.0 pg Final  . MCHC 05/28/2015 32.0  30.0 - 36.0 g/dL Final  . RDW 05/28/2015 14.3  11.5 - 15.5 % Final  . Platelets 05/28/2015 316  150 - 400 K/uL Final  . Sodium 05/28/2015 142  135 - 145 mmol/L Final  . Potassium 05/28/2015 4.5  3.5 - 5.1 mmol/L Final  . Chloride 05/28/2015 108  101 - 111 mmol/L Final  . CO2 05/28/2015 28  22 - 32 mmol/L Final  . Glucose, Bld 05/28/2015 104* 65 - 99 mg/dL Final  . BUN 05/28/2015 11  6 - 20 mg/dL Final  .  Creatinine, Ser 05/28/2015 0.77  0.44 - 1.00 mg/dL Final  . Calcium 05/28/2015 9.1  8.9 - 10.3 mg/dL Final  . GFR calc non Af Amer 05/28/2015 >60  >60 mL/min Final  . GFR calc Af Amer 05/28/2015 >60  >60 mL/min Final   Comment: (NOTE) The eGFR has been calculated using the CKD EPI equation. This calculation has not been validated in all clinical situations. eGFR's persistently <60 mL/min signify possible Chronic Kidney Disease.   . Anion gap 05/28/2015 6  5 - 15 Final     X-Rays:No results found.  EKG: Orders placed or performed during the hospital encounter of 05/28/15  . EKG 12-Lead  . EKG 12-Lead     Hospital Course: Patient was admitted to [facility] and taken to the OR and underwent the above state procedure without complications.  Patient tolerated the procedure well and was later  transferred to the recovery room and then to the orthopaedic floor for postoperative care.  They were given PO and IV analgesics for pain control following their surgery.  They were given 24 hours of postoperative antibiotics of  Anti-infectives    Start     Dose/Rate Route Frequency Ordered Stop   06/03/15 0100  ceFAZolin (ANCEF) IVPB 2 g/50 mL premix     2 g 100 mL/hr over 30 Minutes Intravenous Every 6 hours 06/02/15 2248 06/03/15 1303   06/02/15 1615  ceFAZolin (ANCEF) IVPB 2 g/50 mL premix     2 g 100 mL/hr over 30 Minutes Intravenous On call to O.R. 06/02/15 1603 06/02/15 1924     and started on DVT prophylaxis in the form of Aspirin. Patient was encouraged to get up and ambulate the day after surgery.  The patient was allowed to be WBAT with therapy. Discharge planning was consulted to help with postop disposition and equipment needs.  Patient had a tough night on the evening of surgery.  They started to get up OOB with therapy on day one.   Patient was seen in rounds the following morning on day two and was ready to go home following morning ambulation.   Discharge home with home health Diet - Regular diet and Cardiac diet Follow up - in 2 weeks Activity - WBAT Disposition - Home Condition Upon Discharge - Good D/C Meds - See DC Summary DVT Prophylaxis - Aspirin  Discharge Instructions    Call MD / Call 911    Complete by:  As directed   If you experience chest pain or shortness of breath, CALL 911 and be transported to the hospital emergency room.  If you develope a fever above 101 F, pus (white drainage) or increased drainage or redness at the wound, or calf pain, call your surgeon's office.     Change dressing    Complete by:  As directed   Change dressing daily with sterile 4 x 4 inch gauze dressing and apply TED hose. Do not submerge the incision under water.     Constipation Prevention    Complete by:  As directed   Drink plenty of fluids.  Prune juice may be helpful.  You  may use a stool softener, such as Colace (over the counter) 100 mg twice a day.  Use MiraLax (over the counter) for constipation as needed.     Diet - low sodium heart healthy    Complete by:  As directed      Discharge instructions    Complete by:  As directed   Pick  up stool softner and laxative for home use following surgery while on pain medications. Do not submerge incision under water. Please use good hand washing techniques while changing dressing each day. May shower starting three days after surgery. Please use a clean towel to pat the incision dry following showers. Continue to use ice for pain and swelling after surgery. Do not use any lotions or creams on the incision until instructed by your surgeon.  Resume the Aspirin 325 mg daily at home.  Knee Immobilizer to right knee at all times. No bending or range of motion to the right knee.  Postoperative Constipation Protocol  Constipation - defined medically as fewer than three stools per week and severe constipation as less than one stool per week.  One of the most common issues patients have following surgery is constipation.  Even if you have a regular bowel pattern at home, your normal regimen is likely to be disrupted due to multiple reasons following surgery.  Combination of anesthesia, postoperative narcotics, change in appetite and fluid intake all can affect your bowels.  In order to avoid complications following surgery, here are some recommendations in order to help you during your recovery period.  Colace (docusate) - Pick up an over-the-counter form of Colace or another stool softener and take twice a day as long as you are requiring postoperative pain medications.  Take with a full glass of water daily.  If you experience loose stools or diarrhea, hold the colace until you stool forms back up.  If your symptoms do not get better within 1 week or if they get worse, check with your doctor.  Dulcolax (bisacodyl) - Pick  up over-the-counter and take as directed by the product packaging as needed to assist with the movement of your bowels.  Take with a full glass of water.  Use this product as needed if not relieved by Colace only.   MiraLax (polyethylene glycol) - Pick up over-the-counter to have on hand.  MiraLax is a solution that will increase the amount of water in your bowels to assist with bowel movements.  Take as directed and can mix with a glass of water, juice, soda, coffee, or tea.  Take if you go more than two days without a movement. Do not use MiraLax more than once per day. Call your doctor if you are still constipated or irregular after using this medication for 7 days in a row.  If you continue to have problems with postoperative constipation, please contact the office for further assistance and recommendations.  If you experience "the worst abdominal pain ever" or develop nausea or vomiting, please contact the office immediatly for further recommendations for treatment.     Do not put a pillow under the knee. Place it under the heel.    Complete by:  As directed      Do not sit on low chairs, stoools or toilet seats, as it may be difficult to get up from low surfaces    Complete by:  As directed      Driving restrictions    Complete by:  As directed   No driving until released by the physician.     Increase activity slowly as tolerated    Complete by:  As directed   No bending or range of motion to the right knee. Knee Immobilizer at all times.     Lifting restrictions    Complete by:  As directed   No lifting until released by the physician.  Patient may shower    Complete by:  As directed   You may shower without a dressing once there is no drainage.  Do not wash over the wound.  If drainage remains, do not shower until drainage stops.     TED hose    Complete by:  As directed   Use stockings (TED hose) for 3 weeks on both leg(s).  You may remove them at night for sleeping.      Weight bearing as tolerated    Complete by:  As directed   Laterality:  right  Extremity:  Lower            Medication List    TAKE these medications        aspirin 325 MG tablet  Take 325 mg by mouth daily.     calcium citrate-vitamin D 500-400 MG-UNIT chewable tablet  Chew 1 tablet by mouth 3 (three) times daily. Spaces tabs ~2-3 hrs apart around lunchtime     cyanocobalamin 1000 MCG/ML injection  Commonly known as:  (VITAMIN B-12)  Inject 1,000 mcg into the skin every 30 (thirty) days.     escitalopram 10 MG tablet  Commonly known as:  LEXAPRO  Take 10 mg by mouth at bedtime.     FLINSTONES GUMMIES OMEGA-3 DHA PO  Take 1 each by mouth 2 (two) times daily.     gabapentin 100 MG capsule  Commonly known as:  NEURONTIN  Take 300 mg by mouth 2 (two) times daily.     HYDROmorphone 2 MG tablet  Commonly known as:  DILAUDID  Take 1-2 tablets (2-4 mg total) by mouth every 4 (four) hours as needed for moderate pain or severe pain.     levothyroxine 125 MCG tablet  Commonly known as:  SYNTHROID, LEVOTHROID  Take 125 mcg by mouth daily before breakfast. Brand Name Only     methocarbamol 500 MG tablet  Commonly known as:  ROBAXIN  Take 1 tablet (500 mg total) by mouth every 6 (six) hours as needed for muscle spasms.     metoprolol succinate 25 MG 24 hr tablet  Commonly known as:  TOPROL-XL  Take 25 mg by mouth daily with lunch.     pantoprazole 40 MG tablet  Commonly known as:  PROTONIX  Take 40 mg by mouth daily with lunch.     simvastatin 40 MG tablet  Commonly known as:  ZOCOR  Take 40 mg by mouth daily.     Vitamin D (Ergocalciferol) 50000 UNITS Caps capsule  Commonly known as:  DRISDOL  Take 50,000 Units by mouth every 7 (seven) days. Fridays           Follow-up Information    Follow up with Gearlean Alf, MD. Schedule an appointment as soon as possible for a visit on 06/16/2015.   Specialty:  Orthopedic Surgery   Why:  Call office at [phone] to setup  appointment on Tuesday 06/16/2015 with Dr. Wynelle Link.   Contact information:   [address] [place] [address] [phone]       Signed: Arlee Muslim, PA-C Orthopaedic Surgery 06/04/2015, 9:19 AM

## 2015-06-04 NOTE — Progress Notes (Signed)
06/04/15 1000 Reviewed discharge instructions with patient. Patient verbalized understanding of discharge instructions. Copy of discharge instructions and prescriptions given to patient.

## 2015-06-05 NOTE — Anesthesia Postprocedure Evaluation (Signed)
  Anesthesia Post-op Note  Patient: Andrea Norton  Procedure(s) Performed: Procedure(s) (LRB): RIGHT KNEE QUADRICEP TENDON REPAIR  (Right)  Patient Location: PACU  Anesthesia Type: General  Level of Consciousness: awake and alert   Airway and Oxygen Therapy: Patient Spontanous Breathing  Post-op Pain: mild  Post-op Assessment: Post-op Vital signs reviewed, Patient's Cardiovascular Status Stable, Respiratory Function Stable, Patent Airway and No signs of Nausea or vomiting  Last Vitals:  Filed Vitals:   06/04/15 0534  BP: 121/66  Pulse: 62  Temp: 36.4 C  Resp: 16    Post-op Vital Signs: stable   Complications: No apparent anesthesia complications

## 2015-06-08 ENCOUNTER — Ambulatory Visit: Payer: Medicare Other | Admitting: Dietician

## 2015-07-07 ENCOUNTER — Ambulatory Visit: Payer: Medicare Other | Admitting: Dietician

## 2015-07-16 ENCOUNTER — Ambulatory Visit: Payer: Medicare Other | Attending: Orthopedic Surgery | Admitting: Physical Therapy

## 2015-07-16 DIAGNOSIS — M25661 Stiffness of right knee, not elsewhere classified: Secondary | ICD-10-CM | POA: Insufficient documentation

## 2015-07-16 DIAGNOSIS — M25561 Pain in right knee: Secondary | ICD-10-CM | POA: Diagnosis present

## 2015-07-16 DIAGNOSIS — R5381 Other malaise: Secondary | ICD-10-CM | POA: Diagnosis present

## 2015-07-16 DIAGNOSIS — M25461 Effusion, right knee: Secondary | ICD-10-CM

## 2015-07-16 DIAGNOSIS — R269 Unspecified abnormalities of gait and mobility: Secondary | ICD-10-CM | POA: Insufficient documentation

## 2015-07-16 DIAGNOSIS — R531 Weakness: Secondary | ICD-10-CM | POA: Diagnosis present

## 2015-07-16 NOTE — Therapy (Signed)
[facility] [address] [place], [place], [address] Phone: [phone]   Fax:  [phone]  Physical Therapy Evaluation  Patient Details  Name: Andrea Norton MRN: [id number] Date of Birth: 1946-05-11 Referring Provider: Ollen Gross MD  Encounter Date: 07/16/2015      PT End of Session - 07/16/15 1734    Visit Number 1   Date for PT Re-Evaluation 08/27/15   PT Start Time 0107   PT Stop Time 0149   PT Time Calculation (min) 42 min   Activity Tolerance Patient tolerated treatment well   Behavior During Therapy [facility] for tasks assessed/performed      Past Medical History  Diagnosis Date  . Hyperlipidemia   . Status post primary angioplasty with coronary stent     DRUG-ELUTING STENT X1  TO PROXIMAL LAD  . Chronic back pain   . Chronic neck pain   . Arthritis     HANDS, KNEES  . DDD (degenerative disc disease), cervical   . DDD (degenerative disc disease), lumbar   . Radicular pain in left arm OCCASIONAL PAIN SHOOTING DOWN LEFT ARM SECAONDARY TO  CERVICAL DEGENERATION  . Hemorrhoid   . Diverticulitis of colon   . B12 deficiency anemia   . Vitamin D deficiency   . Hypothyroidism   . Synovitis of knee HYPERTROPHIC RIGHT KNEE  . Abnormal stress test 01-22-2009    LOW RISK ADENOSINE NUCLEAR STUDY W/ PROBABLE MILD APICAL THINNING BUT NO ISCHEMIA  . Depression     states well controlled  . GERD (gastroesophageal reflux disease)   . Inguinal hernia, right     with ventral hernia  . Hypertension       PCP Dr Lysbeth Galas    . Hemorrhoids   . Coronary artery disease 2005    STRESS TEST /, NOte Dr Kirtland Bouchard EPIC/ no cardiologist now--STATES [facility] STENT PLACED 2010  . Sleep apnea     couldnt tolerate machine- states last sleep study " many years ago"  . Bronchitis     hx of   . H/O hiatal hernia   . Pneumonia     hx of walking pneumonia    Past Surgical History  Procedure Laterality Date  . Knee closed reduction  05-07-2009   RIGHT KNEE    LEFT KNEE  10-01-2009  . Total knee arthroplasty  LEFT  2005    RIGHT  03-13-2009  . Total knee revision  RIGHT 09-30-2009    LEFT 11-25-2009  . Hysteroscopy w/d&c  2002  . Open patellofemoral right knee/ lateral release  05-04-2009  . Knee arthroscopy w/ debridement  01-22-2010    SEPTIC KNEE  . Right knee i & d polythylene revision  02-04-2010    KNEE INFECTED  . Left foot surg.  1990  . Appendectomy  1993  . Knee arthroscopy  LEFT 1999    RIGHT 2004  . Hemicolectomy for diverticulitis  2005  . Knee arthroscopy  08/03/2011    Procedure: ARTHROSCOPY KNEE;  Surgeon: Loanne Drilling;  Location: [facility];  Service: Orthopedics;  Laterality: Right;  . Synovectomy  08/03/2011    Procedure: SYNOVECTOMY;  Surgeon: Loanne Drilling;  Location: [facility];  Service: Orthopedics;  Laterality: Right;  . I&d knee with poly exchange  02/01/2012    Procedure: IRRIGATION AND DEBRIDEMENT KNEE WITH POLY EXCHANGE;  Surgeon: Loanne Drilling, MD;  Location: [facility];  Service: Orthopedics;  Laterality: Right;  Right Knee Polyethlene Revision   .  Colon surgery    . Total knee revision Right 02/20/2013    Procedure: RIGHT TOTAL KNEE ARTHROPLASTY REVISION VERSUS RESECTION ARTHROPLASTY;  Surgeon: Loanne DrillingFrank V Aluisio, MD;  Location: [facility];  Service: Orthopedics;  Laterality: Right;  . Joint replacement      right knee   . Coronary angioplasty  2004    DRUG-ELUTING STENT X1 TO PROXIMAL  LAD  . Cataract extraction w/phaco Right 05/06/2014    Procedure: CATARACT EXTRACTION PHACO AND INTRAOCULAR LENS PLACEMENT (IOC);  Surgeon: Loraine LericheMark T. Nile RiggsShapiro, MD;  Location: [facility];  Service: Ophthalmology;  Laterality: Right;  CDE 6.41  . Total knee revision Right 04/02/2014    Procedure: RIGHT KNEE FEMORAL ARTHROPLASTY REVISION;  Surgeon: Loanne DrillingFrank Aluisio V, MD;  Location: [facility];  Service: Orthopedics;  Laterality: Right;  . Cataract extraction w/phaco Left 05/20/2014    Procedure: CATARACT  EXTRACTION PHACO AND INTRAOCULAR LENS PLACEMENT (IOC);  Surgeon: Loraine LericheMark T. Nile RiggsShapiro, MD;  Location: [facility];  Service: Ophthalmology;  Laterality: Left;  CDE:4.42  . Laparoscopic gastric sleeve resection N/A 07/21/2014    Procedure: LAPAROSCOPIC GASTRIC SLEEVE RESECTION, TAKEDOWN OF INCARCERATED VENTRAL HERNIA, ENTERAL LYSIS, UPPER ENDOSCOPY;  Surgeon: Valarie MerinoMatthew B Martin, MD;  Location: [facility];  Service: General;  Laterality: N/A;  . Knee arthroscopy Right 12/17/2014    Procedure: RIGHT ARTHROSCOPY KNEE, SYNOVECTOMY;  Surgeon: Ollen GrossFrank Aluisio, MD;  Location: [facility];  Service: Orthopedics;  Laterality: Right;  . Irrigation and debridement knee Right 03/10/2015    Procedure: IRRIGATION AND DEBRIDEMENT of right KNEE;  Surgeon: Ollen GrossFrank Aluisio, MD;  Location: [facility];  Service: Orthopedics;  Laterality: Right;  . Quadriceps tendon repair Right 06/02/2015    Procedure: RIGHT KNEE QUADRICEP TENDON REPAIR ;  Surgeon: Ollen GrossFrank Aluisio, MD;  Location: [facility];  Service: Orthopedics;  Laterality: Right;    There were no vitals filed for this visit.  Visit Diagnosis:  Right knee pain - Plan: PT plan of care cert/re-cert  Knee swelling, right - Plan: PT plan of care cert/re-cert  Debility - Plan: PT plan of care cert/re-cert      Subjective Assessment - 07/16/15 1736    Subjective Discouraged that I may never be able to use my leg like i would like again.   Limitations Standing   How long can you stand comfortably? 10 minutes.   Patient Stated Goals Use right leg like normal again.   Currently in Pain? Yes   Pain Score 8    Pain Location Knee   Pain Orientation Right   Pain Descriptors / Indicators Aching;Throbbing   Pain Type Surgical pain   Pain Onset More than a month ago   Pain Frequency Constant   Aggravating Factors  Being up too long.   Pain Relieving Factors Rest.            [facility] PT Assessment - 07/16/15 0001    Assessment   Medical Diagnosis Right quadriceps tendon repair.   Referring  Provider Ollen GrossFrank Aluisio MD   Onset Date/Surgical Date --  06/02/15 (surgery date).   Precautions   Precautions --  No ultrasound.   Restrictions   Weight Bearing Restrictions No   Balance Screen   Has the patient fallen in the past 6 months No   Has the patient had a decrease in activity level because of a fear of falling?  Yes   Is the patient reluctant to leave their home because of a fear of falling?  No   Home Environment   Living  Environment Private residence   Prior Function   Level of Independence Independent   Observation/Other Assessments-Edema    Edema Circumferential   Circumferential Edema   Circumferential - Right  cms   Circumferential - Left  50 cms   ROM / Strength   AROM / PROM / Strength AROM;PROM;Strength   AROM   AROM Assessment Site Knee   Right/Left Knee Right   Right Knee Extension --  -30 degrees.   Right Knee Flexion --  98 degrees.   PROM   PROM Assessment Site Knee   Right/Left Knee Right   Right Knee Extension --  -10 degrees.   Strength   Strength Assessment Site Hip;Knee   Right/Left Hip Right   Right Hip Flexion 2+/5   Right Hip ABduction 3-/5   Right/Left Knee Right   Right Knee Flexion 4-/5   Right Knee Extension 2-/5   Palpation   Palpation comment Diffuse anterior right knee pain.   Ambulation/Gait   Gait Comments Trendelenburg type gait pattern with a straight cane.                   [facility] Adult PT Treatment/Exercise - July 19, 2015 0001    Modalities   Modalities Vasopneumatic   Vasopneumatic   Number Minutes Vasopneumatic  15 minutes   Vasopnuematic Location  --  Right knee.   Vasopneumatic Pressure Medium                  PT Short Term Goals - 07/19/15 1745    PT SHORT TERM GOAL #1   Title I with initial HEP   Time 2   Period Weeks   Status New           PT Long Term Goals - 07-19-2015 1745    PT LONG TERM GOAL #1   Title I with advanced HEP   Time 6   Period Weeks   Status New   PT LONG  TERM GOAL #2   Title improved right knee flex to 115 to improve gait   Time 6   Period Weeks   Status New   PT LONG TERM GOAL #3   Title decreased pain in RLE by 50%   Period Weeks   Status New   PT LONG TERM GOAL #5   Title improve FOTO to CK   Time 6   Period Weeks   Status New               Plan - 2015/07/19 1738    Clinical Impression Statement The patient underwent a right quad tendon repair surgery on 06/02/15.  She prsented to the clinic today with a straight cane for safe ambulation.  Her pain-level as of late israted at a 7-8/10 especially if up for longer periods of time.   Pt will benefit from skilled therapeutic intervention in order to improve on the following deficits Pain;Decreased activity tolerance;Increased edema;Decreased strength;Decreased range of motion   Rehab Potential Fair   PT Frequency 2x / week   PT Duration 6 weeks   PT Treatment/Interventions ADLs/Self Care Home Management;Cryotherapy;Electrical Stimulation;Therapeutic exercise;Therapeutic activities;Neuromuscular re-education;Patient/family education;Passive range of motion   PT Next Visit Plan SLR's; assisted right quad exercises; QS; O and CKC exercises; Nustep (LE's only).   Consulted and Agree with Plan of Care Patient          G-Codes - 07-19-15 1746    Functional Assessment Tool Used FOTO--74% limitation.   Functional Limitation Mobility: Walking and moving around  Mobility: Walking and Moving Around Current Status [phone]) At least 60 percent but less than 80 percent impaired, limited or restricted   Mobility: Walking and Moving Around Goal Status [phone]) At least 40 percent but less than 60 percent impaired, limited or restricted       Problem List Patient Active Problem List   Diagnosis Date Noted  . Quadriceps tendon rupture 06/02/2015  . Cellulitis of right knee 03/07/2015  . 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 S/P laparoscopic sleeve  gastrectomy Dec 2015 07/21/2014  . Postoperative anemia due to acute blood loss 02/21/2013  . 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 Instability of prosthetic knee (HCC) 02/01/2012  . DEGENERATIVE JOINT DISEASE 01/09/2009  . Hypothyroidism 01/06/2009  . Hyperlipidemia 01/06/2009  . OBESITY, MORBID 01/06/2009  . Essential hypertension 01/06/2009  . Coronary atherosclerosis 01/06/2009  . GASTROESOPHAGEAL REFLUX DISEASE 01/06/2009  . SLEEP APNEA 01/06/2009  . DYSPNEA 01/06/2009  . DIVERTICULITIS, HX OF 01/06/2009    [place], [place] MPT 07/16/2015, 6:02 PM  [facility] [address] [place], [place], [address] Phone: [phone]   Fax:  [phone]  Name: AVIELLE IMBERT MRN: [id number] Date of Birth: 12-Sep-1945

## 2015-07-21 ENCOUNTER — Ambulatory Visit: Payer: Medicare Other | Admitting: *Deleted

## 2015-07-21 DIAGNOSIS — M25561 Pain in right knee: Secondary | ICD-10-CM

## 2015-07-21 DIAGNOSIS — R5381 Other malaise: Secondary | ICD-10-CM

## 2015-07-21 DIAGNOSIS — M25661 Stiffness of right knee, not elsewhere classified: Secondary | ICD-10-CM

## 2015-07-21 DIAGNOSIS — R269 Unspecified abnormalities of gait and mobility: Secondary | ICD-10-CM

## 2015-07-21 DIAGNOSIS — R531 Weakness: Secondary | ICD-10-CM

## 2015-07-21 DIAGNOSIS — M25461 Effusion, right knee: Secondary | ICD-10-CM

## 2015-07-21 NOTE — Therapy (Signed)
[facility] [address] [place], [place], [address] Phone: [phone]   Fax:  [phone]  Physical Therapy Treatment  Patient Details  Name: MARIELYS TRINIDAD MRN: [id number] Date of Birth: 26-Oct-1945 Referring Provider: Ollen Gross MD  Encounter Date: 07/21/2015      PT End of Session - 07/21/15 1354    Visit Number 2   Date for PT Re-Evaluation 08/27/15   PT Start Time 1300   PT Stop Time 1350   PT Time Calculation (min) 50 min      Past Medical History  Diagnosis Date  . Hyperlipidemia   . Status post primary angioplasty with coronary stent     DRUG-ELUTING STENT X1  TO PROXIMAL LAD  . Chronic back pain   . Chronic neck pain   . Arthritis     HANDS, KNEES  . DDD (degenerative disc disease), cervical   . DDD (degenerative disc disease), lumbar   . Radicular pain in left arm OCCASIONAL PAIN SHOOTING DOWN LEFT ARM SECAONDARY TO  CERVICAL DEGENERATION  . Hemorrhoid   . Diverticulitis of colon   . B12 deficiency anemia   . Vitamin D deficiency   . Hypothyroidism   . Synovitis of knee HYPERTROPHIC RIGHT KNEE  . Abnormal stress test 01-22-2009    LOW RISK ADENOSINE NUCLEAR STUDY W/ PROBABLE MILD APICAL THINNING BUT NO ISCHEMIA  . Depression     states well controlled  . GERD (gastroesophageal reflux disease)   . Inguinal hernia, right     with ventral hernia  . Hypertension       PCP Dr Lysbeth Galas    . Hemorrhoids   . Coronary artery disease 2005    STRESS TEST /, NOte Dr Kirtland Bouchard EPIC/ no cardiologist now--STATES HEART STENT PLACED 2010  . Sleep apnea     couldnt tolerate machine- states last sleep study " many years ago"  . Bronchitis     hx of   . H/O hiatal hernia   . Pneumonia     hx of walking pneumonia    Past Surgical History  Procedure Laterality Date  . Knee closed reduction  05-07-2009  RIGHT KNEE    LEFT KNEE  10-01-2009  . Total knee arthroplasty  LEFT  2005    RIGHT  03-13-2009  . Total knee  revision  RIGHT 09-30-2009    LEFT 11-25-2009  . Hysteroscopy w/d&c  2002  . Open patellofemoral right knee/ lateral release  05-04-2009  . Knee arthroscopy w/ debridement  01-22-2010    SEPTIC KNEE  . Right knee i & d polythylene revision  02-04-2010    KNEE INFECTED  . Left foot surg.  1990  . Appendectomy  1993  . Knee arthroscopy  LEFT 1999    RIGHT 2004  . Hemicolectomy for diverticulitis  2005  . Knee arthroscopy  08/03/2011    Procedure: ARTHROSCOPY KNEE;  Surgeon: Loanne Drilling;  Location: [facility];  Service: Orthopedics;  Laterality: Right;  . Synovectomy  08/03/2011    Procedure: SYNOVECTOMY;  Surgeon: Loanne Drilling;  Location: [facility];  Service: Orthopedics;  Laterality: Right;  . I&d knee with poly exchange  02/01/2012    Procedure: IRRIGATION AND DEBRIDEMENT KNEE WITH POLY EXCHANGE;  Surgeon: Loanne Drilling, MD;  Location: [facility];  Service: Orthopedics;  Laterality: Right;  Right Knee Polyethlene Revision   . Colon surgery    . Total knee revision Right 02/20/2013    Procedure:  RIGHT TOTAL KNEE ARTHROPLASTY REVISION VERSUS RESECTION ARTHROPLASTY;  Surgeon: Loanne DrillingFrank V Aluisio, MD;  Location: [facility];  Service: Orthopedics;  Laterality: Right;  . Joint replacement      right knee   . Coronary angioplasty  2004    DRUG-ELUTING STENT X1 TO PROXIMAL  LAD  . Cataract extraction w/phaco Right 05/06/2014    Procedure: CATARACT EXTRACTION PHACO AND INTRAOCULAR LENS PLACEMENT (IOC);  Surgeon: Loraine LericheMark T. Nile RiggsShapiro, MD;  Location: [facility];  Service: Ophthalmology;  Laterality: Right;  CDE 6.41  . Total knee revision Right 04/02/2014    Procedure: RIGHT KNEE FEMORAL ARTHROPLASTY REVISION;  Surgeon: Loanne DrillingFrank Aluisio V, MD;  Location: [facility];  Service: Orthopedics;  Laterality: Right;  . Cataract extraction w/phaco Left 05/20/2014    Procedure: CATARACT EXTRACTION PHACO AND INTRAOCULAR LENS PLACEMENT (IOC);  Surgeon: Loraine LericheMark T. Nile RiggsShapiro, MD;  Location: [facility];  Service:  Ophthalmology;  Laterality: Left;  CDE:4.42  . Laparoscopic gastric sleeve resection N/A 07/21/2014    Procedure: LAPAROSCOPIC GASTRIC SLEEVE RESECTION, TAKEDOWN OF INCARCERATED VENTRAL HERNIA, ENTERAL LYSIS, UPPER ENDOSCOPY;  Surgeon: Valarie MerinoMatthew B Martin, MD;  Location: [facility];  Service: General;  Laterality: N/A;  . Knee arthroscopy Right 12/17/2014    Procedure: RIGHT ARTHROSCOPY KNEE, SYNOVECTOMY;  Surgeon: Ollen GrossFrank Aluisio, MD;  Location: [facility];  Service: Orthopedics;  Laterality: Right;  . Irrigation and debridement knee Right 03/10/2015    Procedure: IRRIGATION AND DEBRIDEMENT of right KNEE;  Surgeon: Ollen GrossFrank Aluisio, MD;  Location: [facility];  Service: Orthopedics;  Laterality: Right;  . Quadriceps tendon repair Right 06/02/2015    Procedure: RIGHT KNEE QUADRICEP TENDON REPAIR ;  Surgeon: Ollen GrossFrank Aluisio, MD;  Location: [facility];  Service: Orthopedics;  Laterality: Right;    There were no vitals filed for this visit.  Visit Diagnosis:  Right knee pain  Knee swelling, right  Pain in right knee  Stiffness of right knee  Abnormality of gait  Debility  Generalized weakness      Subjective Assessment - 07/21/15 1310    Subjective Discouraged that I may never be able to use my leg like i would like again. Doing good with the bend. I can't raise it well.   Limitations Standing   How long can you stand comfortably? 10 minutes.   Patient Stated Goals Use right leg like normal again.   Currently in Pain? Yes   Pain Score 8    Pain Location Knee   Pain Orientation Right   Pain Descriptors / Indicators Aching;Throbbing   Pain Type Surgical pain   Pain Onset More than a month ago   Pain Frequency Constant   Aggravating Factors  Being up too long   Pain Relieving Factors Rest                         [facility] Adult PT Treatment/Exercise - 07/21/15 0001    Exercises   Exercises Knee/Hip   Knee/Hip Exercises: Aerobic   Nustep L3   LEs only x 10 mins   Knee/Hip Exercises: Supine    Quad Sets AROM;Right;20 reps   [employer] Quad Sets --  Attempted but unable to do.   Straight Leg Raises AAROM;3 sets;10 reps  needs assistance   Modalities   Modalities Vasopneumatic;Electrical Stimulation   Electrical Stimulation   Electrical Stimulation Location Premod x 15 mins  to RT knee with Vaso  and  VMS with Quad sets x 10 mins   Electrical Stimulation Action VMS 10  sec on/off   Electrical Stimulation Goals Tone;Pain;Edema   Vasopneumatic   Number Minutes Vasopneumatic  15 minutes   Vasopnuematic Location  Knee   Vasopneumatic Pressure Medium   Vasopneumatic Temperature  38   Manual Therapy   Manual Therapy --  AAROM with SLR and SAQs                  PT Short Term Goals - 07/16/15 1745    PT SHORT TERM GOAL #1   Title I with initial HEP   Time 2   Period Weeks   Status New           PT Long Term Goals - 07/16/15 1745    PT LONG TERM GOAL #1   Title I with advanced HEP   Time 6   Period Weeks   Status New   PT LONG TERM GOAL #2   Title improved right knee flex to 115 to improve gait   Time 6   Period Weeks   Status New   PT LONG TERM GOAL #3   Title decreased pain in RLE by 50%   Period Weeks   Status New   PT LONG TERM GOAL #5   Title improve FOTO to CK   Time 6   Period Weeks   Status New               Plan - 07/21/15 1321    Clinical Impression Statement Pt did fair today with Rx. She was able to do fairly well on Nustep with LEs only, but had difficulty with SAQs and SLRs and needed assistance. Did ok with VMS, but unable to tolerate much Intensity.   Pt will benefit from skilled therapeutic intervention in order to improve on the following deficits Pain;Decreased activity tolerance;Increased edema;Decreased strength;Decreased range of motion   Rehab Potential Fair   PT Frequency 2x / week   PT Duration 6 weeks   PT Treatment/Interventions ADLs/Self Care Home Management;Cryotherapy;Electrical Stimulation;Therapeutic  exercise;Therapeutic activities;Neuromuscular re-education;Patient/family education;Passive range of motion   PT Next Visit Plan SLR's; assisted right quad exercises; QS; O and CKC exercises; Nustep (LE's only).   Consulted and Agree with Plan of Care Patient        Problem List Patient Active Problem List   Diagnosis Date Noted  . Quadriceps tendon rupture 06/02/2015  . Cellulitis of right knee 03/07/2015  . 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 Postoperative anemia due to acute blood loss 02/21/2013  . 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 Instability of prosthetic knee (HCC) 02/01/2012  . DEGENERATIVE JOINT DISEASE 01/09/2009  . Hypothyroidism 01/06/2009  . Hyperlipidemia 01/06/2009  . OBESITY, MORBID 01/06/2009  . Essential hypertension 01/06/2009  . Coronary atherosclerosis 01/06/2009  . GASTROESOPHAGEAL REFLUX DISEASE 01/06/2009  . SLEEP APNEA 01/06/2009  . DYSPNEA 01/06/2009  . DIVERTICULITIS, HX OF 01/06/2009    RAMSEUR,CHRIS, PTA 07/21/2015, 2:00 PM  [facility] [address] [place], [place], [address] Phone: [phone]   Fax:  [phone]  Name: ELYCE ZOLLINGER MRN: [id number] Date of Birth: 06/20/1946

## 2015-07-23 ENCOUNTER — Ambulatory Visit: Payer: Medicare Other | Admitting: *Deleted

## 2015-07-23 DIAGNOSIS — M25561 Pain in right knee: Secondary | ICD-10-CM | POA: Diagnosis not present

## 2015-07-23 DIAGNOSIS — R531 Weakness: Secondary | ICD-10-CM

## 2015-07-23 DIAGNOSIS — M25661 Stiffness of right knee, not elsewhere classified: Secondary | ICD-10-CM

## 2015-07-23 DIAGNOSIS — M25461 Effusion, right knee: Secondary | ICD-10-CM

## 2015-07-23 DIAGNOSIS — R269 Unspecified abnormalities of gait and mobility: Secondary | ICD-10-CM

## 2015-07-23 DIAGNOSIS — R5381 Other malaise: Secondary | ICD-10-CM

## 2015-07-23 NOTE — Therapy (Signed)
[facility] [address] [place], [place], [address] Phone: [phone]   Fax:  [phone]  Physical Therapy Treatment  Patient Details  Name: Andrea Norton MRN: [id number] Date of Birth: 13-Aug-1945 Referring Provider: Ollen Gross MD  Encounter Date: 07/23/2015      PT End of Session - 07/23/15 1419    Visit Number 3   Date for PT Re-Evaluation 08/27/15   PT Start Time 1345   PT Stop Time 1435   PT Time Calculation (min) 50 min      Past Medical History  Diagnosis Date  . Hyperlipidemia   . Status post primary angioplasty with coronary stent     DRUG-ELUTING STENT X1  TO PROXIMAL LAD  . Chronic back pain   . Chronic neck pain   . Arthritis     HANDS, KNEES  . DDD (degenerative disc disease), cervical   . DDD (degenerative disc disease), lumbar   . Radicular pain in left arm OCCASIONAL PAIN SHOOTING DOWN LEFT ARM SECAONDARY TO  CERVICAL DEGENERATION  . Hemorrhoid   . Diverticulitis of colon   . B12 deficiency anemia   . Vitamin D deficiency   . Hypothyroidism   . Synovitis of knee HYPERTROPHIC RIGHT KNEE  . Abnormal stress test 01-22-2009    LOW RISK ADENOSINE NUCLEAR STUDY W/ PROBABLE MILD APICAL THINNING BUT NO ISCHEMIA  . Depression     states well controlled  . GERD (gastroesophageal reflux disease)   . Inguinal hernia, right     with ventral hernia  . Hypertension       PCP Dr Lysbeth Galas    . Hemorrhoids   . Coronary artery disease 2005    STRESS TEST /, NOte Dr Kirtland Bouchard EPIC/ no cardiologist now--STATES HEART STENT PLACED 2010  . Sleep apnea     couldnt tolerate machine- states last sleep study " many years ago"  . Bronchitis     hx of   . H/O hiatal hernia   . Pneumonia     hx of walking pneumonia    Past Surgical History  Procedure Laterality Date  . Knee closed reduction  05-07-2009  RIGHT KNEE    LEFT KNEE  10-01-2009  . Total knee arthroplasty  LEFT  2005    RIGHT  03-13-2009  . Total knee  revision  RIGHT 09-30-2009    LEFT 11-25-2009  . Hysteroscopy w/d&c  2002  . Open patellofemoral right knee/ lateral release  05-04-2009  . Knee arthroscopy w/ debridement  01-22-2010    SEPTIC KNEE  . Right knee i & d polythylene revision  02-04-2010    KNEE INFECTED  . Left foot surg.  1990  . Appendectomy  1993  . Knee arthroscopy  LEFT 1999    RIGHT 2004  . Hemicolectomy for diverticulitis  2005  . Knee arthroscopy  08/03/2011    Procedure: ARTHROSCOPY KNEE;  Surgeon: Loanne Drilling;  Location: [facility];  Service: Orthopedics;  Laterality: Right;  . Synovectomy  08/03/2011    Procedure: SYNOVECTOMY;  Surgeon: Loanne Drilling;  Location: [facility];  Service: Orthopedics;  Laterality: Right;  . I&d knee with poly exchange  02/01/2012    Procedure: IRRIGATION AND DEBRIDEMENT KNEE WITH POLY EXCHANGE;  Surgeon: Loanne Drilling, MD;  Location: [facility];  Service: Orthopedics;  Laterality: Right;  Right Knee Polyethlene Revision   . Colon surgery    . Total knee revision Right 02/20/2013    Procedure:  RIGHT TOTAL KNEE ARTHROPLASTY REVISION VERSUS RESECTION ARTHROPLASTY;  Surgeon: Gearlean Alf, MD;  Location: [facility];  Service: Orthopedics;  Laterality: Right;  . Joint replacement      right knee   . Coronary angioplasty  2004    DRUG-ELUTING STENT X1 TO PROXIMAL  LAD  . Cataract extraction w/phaco Right 05/06/2014    Procedure: CATARACT EXTRACTION PHACO AND INTRAOCULAR LENS PLACEMENT (IOC);  Surgeon: Elta Guadeloupe T. Gershon Crane, MD;  Location: [facility];  Service: Ophthalmology;  Laterality: Right;  CDE 6.41  . Total knee revision Right 04/02/2014    Procedure: RIGHT KNEE FEMORAL ARTHROPLASTY REVISION;  Surgeon: Gearlean Alf, MD;  Location: [facility];  Service: Orthopedics;  Laterality: Right;  . Cataract extraction w/phaco Left 05/20/2014    Procedure: CATARACT EXTRACTION PHACO AND INTRAOCULAR LENS PLACEMENT (IOC);  Surgeon: Elta Guadeloupe T. Gershon Crane, MD;  Location: [facility];  Service:  Ophthalmology;  Laterality: Left;  CDE:4.42  . Laparoscopic gastric sleeve resection N/A 07/21/2014    Procedure: LAPAROSCOPIC GASTRIC SLEEVE RESECTION, TAKEDOWN OF INCARCERATED VENTRAL HERNIA, ENTERAL LYSIS, UPPER ENDOSCOPY;  Surgeon: Pedro Earls, MD;  Location: [facility];  Service: General;  Laterality: N/A;  . Knee arthroscopy Right 12/17/2014    Procedure: RIGHT ARTHROSCOPY KNEE, SYNOVECTOMY;  Surgeon: Gaynelle Arabian, MD;  Location: [facility];  Service: Orthopedics;  Laterality: Right;  . Irrigation and debridement knee Right 03/10/2015    Procedure: IRRIGATION AND DEBRIDEMENT of right KNEE;  Surgeon: Gaynelle Arabian, MD;  Location: [facility];  Service: Orthopedics;  Laterality: Right;  . Quadriceps tendon repair Right 06/02/2015    Procedure: RIGHT KNEE QUADRICEP TENDON REPAIR ;  Surgeon: Gaynelle Arabian, MD;  Location: [facility];  Service: Orthopedics;  Laterality: Right;    There were no vitals filed for this visit.  Visit Diagnosis:  Right knee pain  Knee swelling, right  Pain in right knee  Stiffness of right knee  Abnormality of gait  Debility  Generalized weakness      Subjective Assessment - 07/23/15 1410    Subjective Discouraged that I may never be able to use my leg like i would like again. Doing good with the bend. I can't raise it well. Very sore after last Rx   Limitations Standing   How long can you stand comfortably? 10 minutes.   Patient Stated Goals Use right leg like normal again.   Currently in Pain? Yes   Pain Score 8    Pain Location Knee   Pain Orientation Right   Pain Descriptors / Indicators Aching;Throbbing   Pain Type Surgical pain   Pain Onset More than a month ago   Pain Frequency Constant   Aggravating Factors  being up   Pain Relieving Factors rest                         [facility] Adult PT Treatment/Exercise - 07/23/15 0001    Exercises   Exercises Knee/Hip   Knee/Hip Exercises: Aerobic   Nustep L4  LEs only x 15 mins   Knee/Hip  Exercises: Supine   Quad Sets AROM;Right;20 reps   Terminal Knee Extension AROM;Strengthening;Right;3 sets;10 reps  TKE CKC RT LE with Pt pushing into swiss ball against wall   Straight Leg Raises AAROM;3 sets;10 reps  needs assistance   Modalities   Modalities Vasopneumatic;Electrical Stimulation   Electrical Stimulation   Electrical Stimulation Location Premod x 15 mins  to RT knee with Vaso   Electrical Stimulation Goals Pain;Edema  Vasopneumatic   Number Minutes Vasopneumatic  15 minutes   Vasopnuematic Location  Knee   Vasopneumatic Pressure Medium   Vasopneumatic Temperature  38                  PT Short Term Goals - 07/16/15 1745    PT SHORT TERM GOAL #1   Title I with initial HEP   Time 2   Period Weeks   Status New           PT Long Term Goals - 07/16/15 1745    PT LONG TERM GOAL #1   Title I with advanced HEP   Time 6   Period Weeks   Status New   PT LONG TERM GOAL #2   Title improved right knee flex to 115 to improve gait   Time 6   Period Weeks   Status New   PT LONG TERM GOAL #3   Title decreased pain in RLE by 50%   Period Weeks   Status New   PT LONG TERM GOAL #5   Title improve FOTO to CK   Time 6   Period Weeks   Status New               Plan - 07/23/15 1705    Clinical Impression Statement Pt did fairly well today with Rx, but continues to be challenged activating RT LE quads and has extension lag. She feels that she did better today with Exs and felt pushing into ex ball while supine was an ex she would try at home as well. No LTGs met today due to weakness    Pt will benefit from skilled therapeutic intervention in order to improve on the following deficits Pain;Decreased activity tolerance;Increased edema;Decreased strength;Decreased range of motion   Rehab Potential Fair   PT Frequency 2x / week   PT Duration 6 weeks   PT Treatment/Interventions ADLs/Self Care Home Management;Cryotherapy;Electrical  Stimulation;Therapeutic exercise;Therapeutic activities;Neuromuscular re-education;Patient/family education;Passive range of motion   PT Next Visit Plan SLR's; assisted right quad exercises; QS; O and CKC exercises; Nustep (LE's only).   Consulted and Agree with Plan of Care Patient        Problem List Patient Active Problem List   Diagnosis Date Noted  . Quadriceps tendon rupture 06/02/2015  . Cellulitis of right knee 03/07/2015  . 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 Postoperative anemia due to acute blood loss 02/21/2013  . Failed total knee arthroplasty ([place]) 02/20/2013  . Instability of prosthetic knee ([place]) 02/01/2012  . DEGENERATIVE JOINT DISEASE 01/09/2009  . Hypothyroidism 01/06/2009  . Hyperlipidemia 01/06/2009  . OBESITY, MORBID 01/06/2009  . Essential hypertension 01/06/2009  . Coronary atherosclerosis 01/06/2009  . GASTROESOPHAGEAL REFLUX DISEASE 01/06/2009  . SLEEP APNEA 01/06/2009  . DYSPNEA 01/06/2009  . DIVERTICULITIS, HX OF 01/06/2009    Younis Mathey,CHRIS, PTA 07/23/2015, 5:13 PM  [facility] [place], [place], [address] Phone: [phone]   Fax:  [phone]  Name: CYRSTAL LEITZ MRN: [id number] Date of Birth: 01-06-1946

## 2015-07-28 ENCOUNTER — Encounter: Payer: Medicare Other | Admitting: Physical Therapy

## 2015-07-30 ENCOUNTER — Ambulatory Visit: Payer: Medicare Other | Attending: Orthopedic Surgery | Admitting: Physical Therapy

## 2015-07-30 DIAGNOSIS — M25661 Stiffness of right knee, not elsewhere classified: Secondary | ICD-10-CM | POA: Insufficient documentation

## 2015-07-30 DIAGNOSIS — M25461 Effusion, right knee: Secondary | ICD-10-CM

## 2015-07-30 DIAGNOSIS — M25561 Pain in right knee: Secondary | ICD-10-CM

## 2015-07-30 DIAGNOSIS — R531 Weakness: Secondary | ICD-10-CM | POA: Diagnosis present

## 2015-07-30 DIAGNOSIS — R5381 Other malaise: Secondary | ICD-10-CM | POA: Insufficient documentation

## 2015-07-30 DIAGNOSIS — R269 Unspecified abnormalities of gait and mobility: Secondary | ICD-10-CM | POA: Diagnosis present

## 2015-07-30 NOTE — Therapy (Signed)
[facility] [address] [place], [place], [address] Phone: [phone]   Fax:  [phone]  Physical Therapy Treatment  Patient Details  Name: Andrea Norton MRN: [id number] Date of Birth: 02/14/1946 Referring Provider: Ollen Gross MD  Encounter Date: 07/30/2015      PT End of Session - 07/30/15 0903    Visit Number 4   Date for PT Re-Evaluation 08/27/15   PT Start Time 0904   PT Stop Time 1003   PT Time Calculation (min) 59 min   Activity Tolerance Patient tolerated treatment well   Behavior During Therapy [facility] for tasks assessed/performed      Past Medical History  Diagnosis Date  . Hyperlipidemia   . Status post primary angioplasty with coronary stent     DRUG-ELUTING STENT X1  TO PROXIMAL LAD  . Chronic back pain   . Chronic neck pain   . Arthritis     HANDS, KNEES  . DDD (degenerative disc disease), cervical   . DDD (degenerative disc disease), lumbar   . Radicular pain in left arm OCCASIONAL PAIN SHOOTING DOWN LEFT ARM SECAONDARY TO  CERVICAL DEGENERATION  . Hemorrhoid   . Diverticulitis of colon   . B12 deficiency anemia   . Vitamin D deficiency   . Hypothyroidism   . Synovitis of knee HYPERTROPHIC RIGHT KNEE  . Abnormal stress test 01-22-2009    LOW RISK ADENOSINE NUCLEAR STUDY W/ PROBABLE MILD APICAL THINNING BUT NO ISCHEMIA  . Depression     states well controlled  . GERD (gastroesophageal reflux disease)   . Inguinal hernia, right     with ventral hernia  . Hypertension       PCP Dr Lysbeth Galas    . Hemorrhoids   . Coronary artery disease 2005    STRESS TEST /, NOte Dr Kirtland Bouchard EPIC/ no cardiologist now--STATES HEART STENT PLACED 2010  . Sleep apnea     couldnt tolerate machine- states last sleep study " many years ago"  . Bronchitis     hx of   . H/O hiatal hernia   . Pneumonia     hx of walking pneumonia    Past Surgical History  Procedure Laterality Date  . Knee closed reduction  05-07-2009  RIGHT  KNEE    LEFT KNEE  10-01-2009  . Total knee arthroplasty  LEFT  2005    RIGHT  03-13-2009  . Total knee revision  RIGHT 09-30-2009    LEFT 11-25-2009  . Hysteroscopy w/d&c  2002  . Open patellofemoral right knee/ lateral release  05-04-2009  . Knee arthroscopy w/ debridement  01-22-2010    SEPTIC KNEE  . Right knee i & d polythylene revision  02-04-2010    KNEE INFECTED  . Left foot surg.  1990  . Appendectomy  1993  . Knee arthroscopy  LEFT 1999    RIGHT 2004  . Hemicolectomy for diverticulitis  2005  . Knee arthroscopy  08/03/2011    Procedure: ARTHROSCOPY KNEE;  Surgeon: Loanne Drilling;  Location: [facility];  Service: Orthopedics;  Laterality: Right;  . Synovectomy  08/03/2011    Procedure: SYNOVECTOMY;  Surgeon: Loanne Drilling;  Location: [facility];  Service: Orthopedics;  Laterality: Right;  . I&d knee with poly exchange  02/01/2012    Procedure: IRRIGATION AND DEBRIDEMENT KNEE WITH POLY EXCHANGE;  Surgeon: Loanne Drilling, MD;  Location: [facility];  Service: Orthopedics;  Laterality: Right;  Right Knee Polyethlene Revision   .  Colon surgery    . Total knee revision Right 02/20/2013    Procedure: RIGHT TOTAL KNEE ARTHROPLASTY REVISION VERSUS RESECTION ARTHROPLASTY;  Surgeon: Loanne Drilling, MD;  Location: [facility];  Service: Orthopedics;  Laterality: Right;  . Joint replacement      right knee   . Coronary angioplasty  2004    DRUG-ELUTING STENT X1 TO PROXIMAL  LAD  . Cataract extraction w/phaco Right 05/06/2014    Procedure: CATARACT EXTRACTION PHACO AND INTRAOCULAR LENS PLACEMENT (IOC);  Surgeon: Loraine Leriche T. Nile Riggs, MD;  Location: [facility];  Service: Ophthalmology;  Laterality: Right;  CDE 6.41  . Total knee revision Right 04/02/2014    Procedure: RIGHT KNEE FEMORAL ARTHROPLASTY REVISION;  Surgeon: Loanne Drilling, MD;  Location: [facility];  Service: Orthopedics;  Laterality: Right;  . Cataract extraction w/phaco Left 05/20/2014    Procedure: CATARACT  EXTRACTION PHACO AND INTRAOCULAR LENS PLACEMENT (IOC);  Surgeon: Loraine Leriche T. Nile Riggs, MD;  Location: [facility];  Service: Ophthalmology;  Laterality: Left;  CDE:4.42  . Laparoscopic gastric sleeve resection N/A 07/21/2014    Procedure: LAPAROSCOPIC GASTRIC SLEEVE RESECTION, TAKEDOWN OF INCARCERATED VENTRAL HERNIA, ENTERAL LYSIS, UPPER ENDOSCOPY;  Surgeon: Valarie Merino, MD;  Location: [facility];  Service: General;  Laterality: N/A;  . Knee arthroscopy Right 12/17/2014    Procedure: RIGHT ARTHROSCOPY KNEE, SYNOVECTOMY;  Surgeon: Ollen Gross, MD;  Location: [facility];  Service: Orthopedics;  Laterality: Right;  . Irrigation and debridement knee Right 03/10/2015    Procedure: IRRIGATION AND DEBRIDEMENT of right KNEE;  Surgeon: Ollen Gross, MD;  Location: [facility];  Service: Orthopedics;  Laterality: Right;  . Quadriceps tendon repair Right 06/02/2015    Procedure: RIGHT KNEE QUADRICEP TENDON REPAIR ;  Surgeon: Ollen Gross, MD;  Location: [facility];  Service: Orthopedics;  Laterality: Right;    There were no vitals filed for this visit.  Visit Diagnosis:  Right knee pain  Knee swelling, right  Stiffness of right knee      Subjective Assessment - 07/30/15 0905    Subjective Patient states her knee to her hip is hurting today. She couldn't sleep last night. When she goes to turn over it hurts up into her hip and she has to sit up to turn.   Limitations Standing   How long can you stand comfortably? 10 minutes.   Patient Stated Goals Use right leg like normal again.   Currently in Pain? Yes   Pain Score 7    Pain Location Knee   Pain Orientation Right   Pain Descriptors / Indicators Aching;Throbbing   Pain Onset More than a month ago   Pain Frequency Constant   Aggravating Factors  being up on it; turning over in bed   Pain Relieving Factors rest                         [facility] Adult PT Treatment/Exercise - 07/30/15 0001    Knee/Hip Exercises: Aerobic   Nustep L4  LEs only x 10  mins  seat 9; attempted to move forward but too painful   Knee/Hip Exercises: Supine   Quad Sets AROM;Right;20 reps   Short Arc [employer] AAROM;Strengthening;Right;20 reps   Bridges with Clamshell Strengthening;Both;10 reps;2 sets   Other Supine Knee/Hip Exercises hip flexion with knee bent and PT support under heel x 20  pt unable to lift foot of table   Modalities   Modalities Vasopneumatic;Electrical Stimulation   Electrical Stimulation   Electrical Stimulation  Location Premod to R knee   Electrical Stimulation Parameters 80-150 Hz x 15 min to tolerance   Electrical Stimulation Goals Pain   Vasopneumatic   Number Minutes Vasopneumatic  15 minutes   Vasopnuematic Location  Knee   Vasopneumatic Temperature  40   Manual Therapy   Manual Therapy Soft tissue mobilization   Soft tissue mobilization R ITB, gluts and lateral calf in sidelying; medial and lateral knee in hooklying                  PT Short Term Goals - 07/16/15 1745    PT SHORT TERM GOAL #1   Title I with initial HEP   Time 2   Period Weeks   Status New           PT Long Term Goals - 07/16/15 1745    PT LONG TERM GOAL #1   Title I with advanced HEP   Time 6   Period Weeks   Status New   PT LONG TERM GOAL #2   Title improved right knee flex to 115 to improve gait   Time 6   Period Weeks   Status New   PT LONG TERM GOAL #3   Title decreased pain in RLE by 50%   Period Weeks   Status New   PT LONG TERM GOAL #5   Title improve FOTO to CK   Time 6   Period Weeks   Status New               Plan - 07/30/15 [id number]    Clinical Impression Statement Patient demos good quad contraction with QS but is unable ot perform SAQ or SLR independently. She has crepitus in lateral knee with AA SAQ and noted tissue tension medially and laterally. Patient reported relief following manual to lateral R leg. Goals are ongoing.   Pt will benefit from skilled therapeutic intervention in order to improve on  the following deficits Pain;Decreased activity tolerance;Increased edema;Decreased strength;Decreased range of motion   Rehab Potential Fair   PT Frequency 2x / week   PT Duration 6 weeks   PT Treatment/Interventions ADLs/Self Care Home Management;Cryotherapy;Electrical Stimulation;Therapeutic exercise;Therapeutic activities;Neuromuscular re-education;Patient/family education;Passive range of motion;Manual techniques;Vasopneumatic Device   PT Next Visit Plan SLR's; assisted right quad exercises; QS; O and CKC exercises; Nustep (LE's only). Continue manual PRN.        Problem List Patient Active Problem List   Diagnosis Date Noted  . Quadriceps tendon rupture 06/02/2015  . Cellulitis of right knee 03/07/2015  . 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 Postoperative anemia due to acute blood loss 02/21/2013  . 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 Instability of prosthetic knee (HCC) 02/01/2012  . DEGENERATIVE JOINT DISEASE 01/09/2009  . Hypothyroidism 01/06/2009  . Hyperlipidemia 01/06/2009  . OBESITY, MORBID 01/06/2009  . Essential hypertension 01/06/2009  . Coronary atherosclerosis 01/06/2009  . GASTROESOPHAGEAL REFLUX DISEASE 01/06/2009  . SLEEP APNEA 01/06/2009  . DYSPNEA 01/06/2009  . DIVERTICULITIS, HX OF 01/06/2009    Solon PalmJulie Nalanie Winiecki PT  07/30/2015, 3:41 PM  [facility] [address] [place], [place], [address] Phone: [phone]   Fax:  [phone]  Name: Lear NgGloria O Foister MRN: [id number] Date of Birth: 09-Sep-1945

## 2015-07-31 ENCOUNTER — Encounter: Payer: Medicare Other | Admitting: Physical Therapy

## 2015-08-03 ENCOUNTER — Encounter: Payer: Medicare Other | Admitting: Physical Therapy

## 2015-08-05 ENCOUNTER — Encounter: Payer: Self-pay | Admitting: Physical Therapy

## 2015-08-05 ENCOUNTER — Ambulatory Visit: Payer: Medicare Other | Admitting: Physical Therapy

## 2015-08-05 DIAGNOSIS — M25561 Pain in right knee: Secondary | ICD-10-CM

## 2015-08-05 DIAGNOSIS — M25661 Stiffness of right knee, not elsewhere classified: Secondary | ICD-10-CM

## 2015-08-05 DIAGNOSIS — R269 Unspecified abnormalities of gait and mobility: Secondary | ICD-10-CM

## 2015-08-05 DIAGNOSIS — R5381 Other malaise: Secondary | ICD-10-CM

## 2015-08-05 DIAGNOSIS — M25461 Effusion, right knee: Secondary | ICD-10-CM

## 2015-08-05 DIAGNOSIS — R531 Weakness: Secondary | ICD-10-CM

## 2015-08-05 NOTE — Therapy (Signed)
[facility] [address] [place], [place], [address] Phone: [phone]   Fax:  [phone]  Physical Therapy Treatment  Patient Details  Name: Andrea Norton MRN: [id number] Date of Birth: [birth year] Referring Provider: Ollen Gross MD  Encounter Date: 08/05/2015      PT End of Session - 08/05/15 0910    Visit Number 5   Date for PT Re-Evaluation 08/27/15   PT Start Time 0905   PT Stop Time 0952   PT Time Calculation (min) 47 min   Activity Tolerance Patient tolerated treatment well   Behavior During Therapy [facility] for tasks assessed/performed      Past Medical History  Diagnosis Date  . Hyperlipidemia   . Status post primary angioplasty with coronary stent     DRUG-ELUTING STENT X1  TO PROXIMAL LAD  . Chronic back pain   . Chronic neck pain   . Arthritis     HANDS, KNEES  . DDD (degenerative disc disease), cervical   . DDD (degenerative disc disease), lumbar   . Radicular pain in left arm OCCASIONAL PAIN SHOOTING DOWN LEFT ARM SECAONDARY TO  CERVICAL DEGENERATION  . Hemorrhoid   . Diverticulitis of colon   . B12 deficiency anemia   . Vitamin D deficiency   . Hypothyroidism   . Synovitis of knee HYPERTROPHIC RIGHT KNEE  . Abnormal stress test 01-22-2009    LOW RISK ADENOSINE NUCLEAR STUDY W/ PROBABLE MILD APICAL THINNING BUT NO ISCHEMIA  . Depression     states well controlled  . GERD (gastroesophageal reflux disease)   . Inguinal hernia, right     with ventral hernia  . Hypertension       PCP Dr Lysbeth Galas    . Hemorrhoids   . Coronary artery disease 2005    STRESS TEST /, NOte Dr Kirtland Bouchard EPIC/ no cardiologist now--STATES HEART STENT PLACED 2010  . Sleep apnea     couldnt tolerate machine- states last sleep study " many years ago"  . Bronchitis     hx of   . H/O hiatal hernia   . Pneumonia     hx of walking pneumonia    Past Surgical History  Procedure Laterality Date  . Knee closed reduction  05-07-2009   RIGHT KNEE    LEFT KNEE  10-01-2009  . Total knee arthroplasty  LEFT  2005    RIGHT  03-13-2009  . Total knee revision  RIGHT 09-30-2009    LEFT 11-25-2009  . Hysteroscopy w/d&c  2002  . Open patellofemoral right knee/ lateral release  05-04-2009  . Knee arthroscopy w/ debridement  01-22-2010    SEPTIC KNEE  . Right knee i & d polythylene revision  02-04-2010    KNEE INFECTED  . Left foot surg.  1990  . Appendectomy  1993  . Knee arthroscopy  LEFT 1999    RIGHT 2004  . Hemicolectomy for diverticulitis  2005  . Knee arthroscopy  08/03/2011    Procedure: ARTHROSCOPY KNEE;  Surgeon: Loanne Drilling;  Location: [facility];  Service: Orthopedics;  Laterality: Right;  . Synovectomy  08/03/2011    Procedure: SYNOVECTOMY;  Surgeon: Loanne Drilling;  [facility];  Service: Orthopedics;  Laterality: Right;  . I&d knee with poly exchange  02/01/2012    Procedure: IRRIGATION AND DEBRIDEMENT KNEE WITH POLY EXCHANGE;  Surgeon: Loanne Drilling, MD;  Location: [facility];  Service: Orthopedics;  Laterality: Right;  Right Knee Polyethlene Revision   .  Colon surgery    . Total knee revision Right 02/20/2013    Procedure: RIGHT TOTAL KNEE ARTHROPLASTY REVISION VERSUS RESECTION ARTHROPLASTY;  Surgeon: Loanne DrillingFrank V Aluisio, MD;  Location: [facility];  Service: Orthopedics;  Laterality: Right;  . Joint replacement      right knee   . Coronary angioplasty  2004    DRUG-ELUTING STENT X1 TO PROXIMAL  LAD  . Cataract extraction w/phaco Right 05/06/2014    Procedure: CATARACT EXTRACTION PHACO AND INTRAOCULAR LENS PLACEMENT (IOC);  Surgeon: Loraine LericheMark T. Nile RiggsShapiro, MD;  Location: [facility];  Service: Ophthalmology;  Laterality: Right;  CDE 6.41  . Total knee revision Right 04/02/2014    Procedure: RIGHT KNEE FEMORAL ARTHROPLASTY REVISION;  Surgeon: Loanne DrillingFrank Aluisio V, MD;  Location: [facility];  Service: Orthopedics;  Laterality: Right;  . Cataract extraction w/phaco Left 05/20/2014    Procedure: CATARACT  EXTRACTION PHACO AND INTRAOCULAR LENS PLACEMENT (IOC);  Surgeon: Loraine LericheMark T. Nile RiggsShapiro, MD;  Location: [facility];  Service: Ophthalmology;  Laterality: Left;  CDE:4.42  . Laparoscopic gastric sleeve resection N/A 07/21/2014    Procedure: LAPAROSCOPIC GASTRIC SLEEVE RESECTION, TAKEDOWN OF INCARCERATED VENTRAL HERNIA, ENTERAL LYSIS, UPPER ENDOSCOPY;  Surgeon: Valarie MerinoMatthew B Martin, MD;  Location: [facility];  Service: General;  Laterality: N/A;  . Knee arthroscopy Right 12/17/2014    Procedure: RIGHT ARTHROSCOPY KNEE, SYNOVECTOMY;  Surgeon: Ollen GrossFrank Aluisio, MD;  Location: [facility];  Service: Orthopedics;  Laterality: Right;  . Irrigation and debridement knee Right 03/10/2015    Procedure: IRRIGATION AND DEBRIDEMENT of right KNEE;  Surgeon: Ollen GrossFrank Aluisio, MD;  Location: [facility];  Service: Orthopedics;  Laterality: Right;  . Quadriceps tendon repair Right 06/02/2015    Procedure: RIGHT KNEE QUADRICEP TENDON REPAIR ;  Surgeon: Ollen GrossFrank Aluisio, MD;  Location: [facility];  Service: Orthopedics;  Laterality: Right;    There were no vitals filed for this visit.  Visit Diagnosis:  Right knee pain  Knee swelling, right  Stiffness of right knee  Pain in right knee  Abnormality of gait  Debility  Generalized weakness      Subjective Assessment - 08/05/15 0910    Subjective Reports she hardly slept last night due to sciatic pain. Wishes to be able to walk without a cane.   Limitations Standing   How long can you stand comfortably? 10 minutes.   Patient Stated Goals Use right leg like normal again.   Currently in Pain? Yes   Pain Score 7    Pain Location Leg   Pain Orientation Right   Pain Descriptors / Indicators Stabbing   Pain Type Surgical pain   Pain Onset More than a month ago            [facility] - 08/05/15 0001    Assessment   Medical Diagnosis Right quadriceps tendon repair.   Onset Date/Surgical Date 06/02/15   Next MD Visit 08/06/2015   Restrictions   Weight Bearing Restrictions No   ROM /  Strength   AROM / PROM / Strength AROM   AROM   Overall AROM  Deficits   AROM Assessment Site Knee   Right/Left Knee Right   Right Knee Extension -10   Right Knee Flexion 104                     [facility] Adult PT Treatment/Exercise - 08/05/15 0001    Knee/Hip Exercises: Aerobic   Nustep L4 x10 min   Knee/Hip Exercises: Supine   Quad Sets AROM;Right;3 sets;10 reps  Short Arc [employer] AAROM;Strengthening;Right;20 reps   Heel Slides AROM;Right;3 sets;10 reps   Terminal Knee Extension AAROM;Right;Other (comment)  x4 min with red ball against wall   Bridges Limitations 2x10 reos   Modalities   Modalities Vasopneumatic;Electrical Research scientist (medical) Location R knee   Electrical Stimulation Action Pre-Mod   Electrical Stimulation Parameters 80-150 hz x15 min   Electrical Stimulation Goals Pain;Edema   Vasopneumatic   Number Minutes Vasopneumatic  15 minutes   Vasopnuematic Location  Knee   Vasopneumatic Pressure Medium   Vasopneumatic Temperature  34                  PT Short Term Goals - 07/16/15 1745    PT SHORT TERM GOAL #1   Title I with initial HEP   Time 2   Period Weeks   Status New           PT Long Term Goals - 07/16/15 1745    PT LONG TERM GOAL #1   Title I with advanced HEP   Time 6   Period Weeks   Status New   PT LONG TERM GOAL #2   Title improved right knee flex to 115 to improve gait   Time 6   Period Weeks   Status New   PT LONG TERM GOAL #3   Title decreased pain in RLE by 50%   Period Weeks   Status New   PT LONG TERM GOAL #5   Title improve FOTO to CK   Time 6   Period Weeks   Status New               Plan - 08/05/15 0939    Clinical Impression Statement Patient continues to have difficulty with R Quad activation although she demonstrated improved R knee ROM today. Completed heel slides much better today only requiring assist with R knee extension. Continues to  require assist with SAQ although patient experienced inferior Quad pull. Patient experienced difficulty with TKE with ball on wall today with being able to maintain ball on wall. R knee ROM measured as 10-104 deg today in supine AROM. Normal modalities response noted following removal of the modalities. Continues to ambulate with SPC and signficant antalgic gait due to pain and ROM deficits. Experienced 5/10 pain following today's treatment.   Pt will benefit from skilled therapeutic intervention in order to improve on the following deficits Pain;Decreased activity tolerance;Increased edema;Decreased strength;Decreased range of motion   Rehab Potential Fair   PT Frequency 2x / week   PT Duration 6 weeks   PT Treatment/Interventions ADLs/Self Care Home Management;Cryotherapy;Electrical Stimulation;Therapeutic exercise;Therapeutic activities;Neuromuscular re-education;Patient/family education;Passive range of motion;Manual techniques;Vasopneumatic Device   PT Next Visit Plan SLR's; assisted right quad exercises; QS; O and CKC exercises; Nustep (LE's only). Continue manual PRN.   Consulted and Agree with Plan of Care Patient        Problem List Patient Active Problem List   Diagnosis Date Noted  . Quadriceps tendon rupture 06/02/2015  . Cellulitis of right knee 03/07/2015  . 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 Postoperative anemia due to acute blood loss 02/21/2013  . 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 Instability of prosthetic knee (HCC) 02/01/2012  . DEGENERATIVE JOINT DISEASE 01/09/2009  . Hypothyroidism 01/06/2009  . Hyperlipidemia 01/06/2009  . OBESITY, MORBID 01/06/2009  . Essential hypertension 01/06/2009  .  Coronary atherosclerosis 01/06/2009  . GASTROESOPHAGEAL REFLUX DISEASE 01/06/2009  . SLEEP APNEA 01/06/2009  . DYSPNEA 01/06/2009  . DIVERTICULITIS, HX OF  01/06/2009    Florence Canner, PTA 08/05/2015 12:25 PM [place] MPT [facility] [address] [place], [place], [address] Phone: [phone]   Fax:  [phone]  Name: Andrea Norton MRN: [id number] Date of Birth: 16-Jul-1946

## 2015-08-11 ENCOUNTER — Encounter: Payer: Medicare Other | Admitting: Physical Therapy

## 2015-08-13 ENCOUNTER — Encounter: Payer: Medicare Other | Admitting: Physical Therapy

## 2015-09-10 ENCOUNTER — Ambulatory Visit: Payer: Self-pay | Admitting: Orthopedic Surgery

## 2015-09-11 ENCOUNTER — Encounter (HOSPITAL_COMMUNITY): Payer: Self-pay | Admitting: *Deleted

## 2015-09-11 ENCOUNTER — Encounter (HOSPITAL_COMMUNITY)
Admission: RE | Admit: 2015-09-11 | Discharge: 2015-09-11 | Disposition: A | Discharge status: Home / Self Care | Payer: Medicare Other | Source: Ambulatory Visit | Attending: Orthopedic Surgery | Admitting: Orthopedic Surgery

## 2015-09-11 LAB — CBC
HEMATOCRIT: 40.6 % (ref 36.0–46.0)
Hemoglobin: 12.5 g/dL (ref 12.0–15.0)
MCH: 27.8 pg (ref 26.0–34.0)
MCHC: 30.8 g/dL (ref 30.0–36.0)
MCV: 90.4 fL (ref 78.0–100.0)
PLATELETS: 314 10*3/uL (ref 150–400)
RBC: 4.49 MIL/uL (ref 3.87–5.11)
RDW: 13.9 % (ref 11.5–15.5)
WBC: 5.8 10*3/uL (ref 4.0–10.5)

## 2015-09-11 LAB — URINALYSIS, ROUTINE W REFLEX MICROSCOPIC
Bilirubin Urine: NEGATIVE
GLUCOSE, UA: NEGATIVE mg/dL
Hgb urine dipstick: NEGATIVE
Ketones, ur: NEGATIVE mg/dL
LEUKOCYTES UA: NEGATIVE
NITRITE: NEGATIVE
PH: 6 (ref 5.0–8.0)
Protein, ur: NEGATIVE mg/dL
SPECIFIC GRAVITY, URINE: 1.023 (ref 1.005–1.030)

## 2015-09-11 LAB — COMPREHENSIVE METABOLIC PANEL
ALT: 14 U/L (ref 14–54)
AST: 20 U/L (ref 15–41)
Albumin: 3.6 g/dL (ref 3.5–5.0)
Alkaline Phosphatase: 82 U/L (ref 38–126)
Anion gap: 9 (ref 5–15)
BILIRUBIN TOTAL: 0.7 mg/dL (ref 0.3–1.2)
BUN: 14 mg/dL (ref 6–20)
CALCIUM: 9.2 mg/dL (ref 8.9–10.3)
CHLORIDE: 106 mmol/L (ref 101–111)
CO2: 29 mmol/L (ref 22–32)
CREATININE: 0.7 mg/dL (ref 0.44–1.00)
Glucose, Bld: 119 mg/dL — ABNORMAL HIGH (ref 65–99)
Potassium: 4.6 mmol/L (ref 3.5–5.1)
Sodium: 144 mmol/L (ref 135–145)
TOTAL PROTEIN: 7.7 g/dL (ref 6.5–8.1)

## 2015-09-11 LAB — APTT: aPTT: 34 seconds (ref 24–37)

## 2015-09-11 LAB — SURGICAL PCR SCREEN
MRSA, PCR: NEGATIVE
STAPHYLOCOCCUS AUREUS: NEGATIVE

## 2015-09-11 LAB — PROTIME-INR
INR: 1.05 (ref 0.00–1.49)
PROTHROMBIN TIME: 13.9 s (ref 11.6–15.2)

## 2015-09-12 LAB — HEMOGLOBIN A1C
Hgb A1c MFr Bld: 5.3 % (ref 4.8–5.6)
MEAN PLASMA GLUCOSE: 105 mg/dL

## 2015-09-14 ENCOUNTER — Encounter (HOSPITAL_COMMUNITY): Payer: Self-pay | Admitting: Anesthesiology

## 2015-09-14 ENCOUNTER — Encounter (HOSPITAL_COMMUNITY)
Admission: RE | Disposition: A | Discharge status: Home Health | Payer: Self-pay | Source: Ambulatory Visit | Attending: Orthopedic Surgery

## 2015-09-14 ENCOUNTER — Inpatient Hospital Stay (HOSPITAL_COMMUNITY): Payer: Medicare Other | Admitting: Certified Registered Nurse Anesthetist

## 2015-09-14 ENCOUNTER — Inpatient Hospital Stay (HOSPITAL_COMMUNITY)
Admission: RE | Admit: 2015-09-14 | Discharge: 2015-09-17 | DRG: 465 | Disposition: A | Discharge status: Home Health | Payer: Medicare Other | Source: Ambulatory Visit | Attending: Orthopedic Surgery | Admitting: Orthopedic Surgery

## 2015-09-14 DIAGNOSIS — I10 Essential (primary) hypertension: Secondary | ICD-10-CM | POA: Diagnosis present

## 2015-09-14 DIAGNOSIS — K219 Gastro-esophageal reflux disease without esophagitis: Secondary | ICD-10-CM | POA: Diagnosis present

## 2015-09-14 DIAGNOSIS — M25561 Pain in right knee: Secondary | ICD-10-CM | POA: Diagnosis present

## 2015-09-14 DIAGNOSIS — K449 Diaphragmatic hernia without obstruction or gangrene: Secondary | ICD-10-CM | POA: Diagnosis present

## 2015-09-14 DIAGNOSIS — Y838 Other surgical procedures as the cause of abnormal reaction of the patient, or of later complication, without mention of misadventure at the time of the procedure: Secondary | ICD-10-CM | POA: Diagnosis not present

## 2015-09-14 DIAGNOSIS — Z96652 Presence of left artificial knee joint: Secondary | ICD-10-CM | POA: Diagnosis present

## 2015-09-14 DIAGNOSIS — T8453XA Infection and inflammatory reaction due to internal right knee prosthesis, initial encounter: Principal | ICD-10-CM

## 2015-09-14 DIAGNOSIS — M00861 Arthritis due to other bacteria, right knee: Secondary | ICD-10-CM | POA: Diagnosis not present

## 2015-09-14 DIAGNOSIS — M1990 Unspecified osteoarthritis, unspecified site: Secondary | ICD-10-CM | POA: Diagnosis present

## 2015-09-14 DIAGNOSIS — B965 Pseudomonas (aeruginosa) (mallei) (pseudomallei) as the cause of diseases classified elsewhere: Secondary | ICD-10-CM | POA: Diagnosis present

## 2015-09-14 DIAGNOSIS — I2510 Atherosclerotic heart disease of native coronary artery without angina pectoris: Secondary | ICD-10-CM | POA: Diagnosis present

## 2015-09-14 DIAGNOSIS — E785 Hyperlipidemia, unspecified: Secondary | ICD-10-CM | POA: Diagnosis present

## 2015-09-14 DIAGNOSIS — Z87440 Personal history of urinary (tract) infections: Secondary | ICD-10-CM | POA: Diagnosis not present

## 2015-09-14 DIAGNOSIS — Z6838 Body mass index (BMI) 38.0-38.9, adult: Secondary | ICD-10-CM | POA: Diagnosis not present

## 2015-09-14 DIAGNOSIS — E119 Type 2 diabetes mellitus without complications: Secondary | ICD-10-CM | POA: Diagnosis present

## 2015-09-14 DIAGNOSIS — M009 Pyogenic arthritis, unspecified: Secondary | ICD-10-CM | POA: Insufficient documentation

## 2015-09-14 DIAGNOSIS — Z01812 Encounter for preprocedural laboratory examination: Secondary | ICD-10-CM

## 2015-09-14 DIAGNOSIS — G4730 Sleep apnea, unspecified: Secondary | ICD-10-CM | POA: Diagnosis present

## 2015-09-14 DIAGNOSIS — Y831 Surgical operation with implant of artificial internal device as the cause of abnormal reaction of the patient, or of later complication, without mention of misadventure at the time of the procedure: Secondary | ICD-10-CM | POA: Diagnosis present

## 2015-09-14 DIAGNOSIS — Z955 Presence of coronary angioplasty implant and graft: Secondary | ICD-10-CM

## 2015-09-14 DIAGNOSIS — E039 Hypothyroidism, unspecified: Secondary | ICD-10-CM | POA: Diagnosis present

## 2015-09-14 DIAGNOSIS — T8453XD Infection and inflammatory reaction due to internal right knee prosthesis, subsequent encounter: Secondary | ICD-10-CM | POA: Diagnosis not present

## 2015-09-14 DIAGNOSIS — Z87891 Personal history of nicotine dependence: Secondary | ICD-10-CM | POA: Diagnosis not present

## 2015-09-14 HISTORY — DX: Unspecified urinary incontinence: R32

## 2015-09-14 HISTORY — DX: Personal history of urinary (tract) infections: Z87.440

## 2015-09-14 HISTORY — PX: EXCISIONAL TOTAL KNEE ARTHROPLASTY WITH ANTIBIOTIC SPACERS: SHX5827

## 2015-09-14 LAB — TYPE AND SCREEN
ABO/RH(D): O POS
Antibody Screen: NEGATIVE

## 2015-09-14 LAB — GLUCOSE, CAPILLARY
GLUCOSE-CAPILLARY: 146 mg/dL — AB (ref 65–99)
Glucose-Capillary: 82 mg/dL (ref 65–99)

## 2015-09-14 SURGERY — REMOVAL, TOTAL ARTHROPLASTY HARDWARE, KNEE, WITH ANTIBIOTIC SPACER INSERTION
Anesthesia: General | Site: Knee | Laterality: Right

## 2015-09-14 MED ORDER — MENTHOL 3 MG MT LOZG: 1.0000 | LOZENGE | OROMUCOSAL | Status: DC | PRN

## 2015-09-14 MED ORDER — HYDROMORPHONE HCL 1 MG/ML IJ SOLN
INTRAMUSCULAR | Status: AC
  Filled 2015-09-14: qty 1

## 2015-09-14 MED ORDER — OXYCODONE HCL 5 MG/5ML PO SOLN
5.0000 mg | Freq: Once | ORAL | Status: DC | PRN
  Filled 2015-09-14: qty 5

## 2015-09-14 MED ORDER — ONDANSETRON HCL 4 MG/2ML IJ SOLN
INTRAMUSCULAR | Status: AC
  Filled 2015-09-14: qty 2

## 2015-09-14 MED ORDER — ESCITALOPRAM OXALATE 10 MG PO TABS
10.0000 mg | ORAL_TABLET | Freq: Every day | ORAL | Status: DC
  Administered 2015-09-14 – 2015-09-16 (×3): 10 mg via ORAL
  Filled 2015-09-14 (×4): qty 1

## 2015-09-14 MED ORDER — ACETAMINOPHEN 325 MG PO TABS: 325.0000 mg | ORAL_TABLET | ORAL | Status: DC | PRN

## 2015-09-14 MED ORDER — DEXAMETHASONE SODIUM PHOSPHATE 10 MG/ML IJ SOLN
INTRAMUSCULAR | Status: AC
  Filled 2015-09-14: qty 1

## 2015-09-14 MED ORDER — SODIUM CHLORIDE 0.9 % IR SOLN
Status: DC | PRN
  Administered 2015-09-14: 1000 mL

## 2015-09-14 MED ORDER — CHLORHEXIDINE GLUCONATE 4 % EX LIQD: 60.0000 mL | Freq: Once | CUTANEOUS | Status: DC

## 2015-09-14 MED ORDER — RIVAROXABAN 10 MG PO TABS
10.0000 mg | ORAL_TABLET | Freq: Every day | ORAL | Status: DC
  Administered 2015-09-15 – 2015-09-17 (×3): 10 mg via ORAL
  Filled 2015-09-14 (×4): qty 1

## 2015-09-14 MED ORDER — OXYCODONE HCL 5 MG PO TABS: 5.0000 mg | ORAL_TABLET | Freq: Once | ORAL | Status: DC | PRN

## 2015-09-14 MED ORDER — HYDROMORPHONE HCL 2 MG PO TABS
2.0000 mg | ORAL_TABLET | ORAL | Status: DC | PRN
  Administered 2015-09-14 – 2015-09-16 (×7): 4 mg via ORAL
  Administered 2015-09-16: 6 mg via ORAL
  Administered 2015-09-16: 4 mg via ORAL
  Administered 2015-09-16: 6 mg via ORAL
  Administered 2015-09-16: 4 mg via ORAL
  Administered 2015-09-17: 6 mg via ORAL
  Administered 2015-09-17: 4 mg via ORAL
  Administered 2015-09-17: 6 mg via ORAL
  Filled 2015-09-14 (×2): qty 2
  Filled 2015-09-14: qty 3
  Filled 2015-09-14: qty 2
  Filled 2015-09-14: qty 3
  Filled 2015-09-14 (×7): qty 2
  Filled 2015-09-14 (×2): qty 3

## 2015-09-14 MED ORDER — FENTANYL CITRATE (PF) 100 MCG/2ML IJ SOLN
INTRAMUSCULAR | Status: AC
  Filled 2015-09-14: qty 2

## 2015-09-14 MED ORDER — DOCUSATE SODIUM 100 MG PO CAPS
100.0000 mg | ORAL_CAPSULE | Freq: Two times a day (BID) | ORAL | Status: DC
  Administered 2015-09-14 – 2015-09-17 (×6): 100 mg via ORAL

## 2015-09-14 MED ORDER — ONDANSETRON HCL 4 MG PO TABS
4.0000 mg | ORAL_TABLET | Freq: Four times a day (QID) | ORAL | Status: DC | PRN
  Administered 2015-09-15: 4 mg via ORAL
  Filled 2015-09-14: qty 1

## 2015-09-14 MED ORDER — CEFAZOLIN SODIUM-DEXTROSE 2-3 GM-% IV SOLR
INTRAVENOUS | Status: AC
  Filled 2015-09-14: qty 50

## 2015-09-14 MED ORDER — LIDOCAINE HCL (CARDIAC) 20 MG/ML IV SOLN
INTRAVENOUS | Status: DC | PRN
  Administered 2015-09-14: 50 mg via INTRAVENOUS

## 2015-09-14 MED ORDER — METHOCARBAMOL 1000 MG/10ML IJ SOLN
500.0000 mg | Freq: Four times a day (QID) | INTRAVENOUS | Status: DC | PRN
  Administered 2015-09-14: 500 mg via INTRAVENOUS
  Filled 2015-09-14 (×2): qty 5

## 2015-09-14 MED ORDER — CEFAZOLIN SODIUM-DEXTROSE 2-3 GM-% IV SOLR
INTRAVENOUS | Status: DC | PRN
  Administered 2015-09-14: 2 g via INTRAVENOUS

## 2015-09-14 MED ORDER — HYDROMORPHONE HCL 1 MG/ML IJ SOLN
1.0000 mg | INTRAMUSCULAR | Status: DC | PRN
  Administered 2015-09-14 – 2015-09-16 (×6): 1 mg via INTRAVENOUS
  Filled 2015-09-14 (×6): qty 1

## 2015-09-14 MED ORDER — SODIUM CHLORIDE 0.9 % IV SOLN
INTRAVENOUS | Status: DC
  Administered 2015-09-14: 22:00:00 via INTRAVENOUS

## 2015-09-14 MED ORDER — BISACODYL 10 MG RE SUPP: 10.0000 mg | Freq: Every day | RECTAL | Status: DC | PRN

## 2015-09-14 MED ORDER — ONDANSETRON HCL 4 MG/2ML IJ SOLN
INTRAMUSCULAR | Status: DC | PRN
  Administered 2015-09-14 (×2): 4 mg via INTRAVENOUS

## 2015-09-14 MED ORDER — MIDAZOLAM HCL 2 MG/2ML IJ SOLN
INTRAMUSCULAR | Status: AC
  Filled 2015-09-14: qty 2

## 2015-09-14 MED ORDER — ACETAMINOPHEN 650 MG RE SUPP: 650.0000 mg | Freq: Four times a day (QID) | RECTAL | Status: DC | PRN

## 2015-09-14 MED ORDER — ACETAMINOPHEN 325 MG PO TABS: 650.0000 mg | ORAL_TABLET | Freq: Four times a day (QID) | ORAL | Status: DC | PRN

## 2015-09-14 MED ORDER — LEVOTHYROXINE SODIUM 125 MCG PO TABS
125.0000 ug | ORAL_TABLET | Freq: Every day | ORAL | Status: DC
  Administered 2015-09-15 – 2015-09-17 (×3): 125 ug via ORAL
  Filled 2015-09-14 (×4): qty 1

## 2015-09-14 MED ORDER — METOCLOPRAMIDE HCL 10 MG PO TABS: 5.0000 mg | ORAL_TABLET | Freq: Three times a day (TID) | ORAL | Status: DC | PRN

## 2015-09-14 MED ORDER — METHOCARBAMOL 500 MG PO TABS
500.0000 mg | ORAL_TABLET | Freq: Four times a day (QID) | ORAL | Status: DC | PRN
Start: 2015-09-14 — End: 2015-09-17
  Administered 2015-09-15 – 2015-09-17 (×7): 500 mg via ORAL
  Filled 2015-09-14 (×8): qty 1

## 2015-09-14 MED ORDER — BUPIVACAINE HCL (PF) 0.25 % IJ SOLN
INTRAMUSCULAR | Status: AC
Start: 2015-09-14 — End: 2015-09-14
  Filled 2015-09-14: qty 60

## 2015-09-14 MED ORDER — TRANEXAMIC ACID 1000 MG/10ML IV SOLN
2000.0000 mg | Freq: Once | INTRAVENOUS | Status: DC
  Filled 2015-09-14: qty 20

## 2015-09-14 MED ORDER — EPHEDRINE SULFATE 50 MG/ML IJ SOLN
INTRAMUSCULAR | Status: DC | PRN
  Administered 2015-09-14 (×4): 10 mg via INTRAVENOUS

## 2015-09-14 MED ORDER — GLYCOPYRROLATE 0.2 MG/ML IJ SOLN
INTRAMUSCULAR | Status: DC | PRN
  Administered 2015-09-14: 0.2 mg via INTRAVENOUS

## 2015-09-14 MED ORDER — LIDOCAINE HCL (CARDIAC) 20 MG/ML IV SOLN
INTRAVENOUS | Status: AC
  Filled 2015-09-14: qty 5

## 2015-09-14 MED ORDER — SODIUM CHLORIDE 0.9 % IR SOLN
Status: DC | PRN
  Administered 2015-09-14: 3000 mL

## 2015-09-14 MED ORDER — HYDROMORPHONE HCL 1 MG/ML IJ SOLN
0.2500 mg | INTRAMUSCULAR | Status: DC | PRN
  Administered 2015-09-14 (×4): 0.5 mg via INTRAVENOUS

## 2015-09-14 MED ORDER — DEXAMETHASONE SODIUM PHOSPHATE 10 MG/ML IJ SOLN
10.0000 mg | Freq: Once | INTRAMUSCULAR | Status: AC
  Administered 2015-09-14: 10 mg via INTRAVENOUS

## 2015-09-14 MED ORDER — BUPIVACAINE HCL (PF) 0.25 % IJ SOLN
INTRAMUSCULAR | Status: DC | PRN
  Administered 2015-09-14: 30 mL

## 2015-09-14 MED ORDER — POLYETHYLENE GLYCOL 3350 17 G PO PACK
17.0000 g | PACK | Freq: Every day | ORAL | Status: DC | PRN
Start: 2015-09-14 — End: 2015-09-17

## 2015-09-14 MED ORDER — PHENOL 1.4 % MT LIQD
1.0000 | OROMUCOSAL | Status: DC | PRN
  Filled 2015-09-14: qty 177

## 2015-09-14 MED ORDER — MIDAZOLAM HCL 5 MG/5ML IJ SOLN
INTRAMUSCULAR | Status: DC | PRN
  Administered 2015-09-14: 2 mg via INTRAVENOUS

## 2015-09-14 MED ORDER — ACETAMINOPHEN 10 MG/ML IV SOLN
INTRAVENOUS | Status: AC
  Filled 2015-09-14: qty 100

## 2015-09-14 MED ORDER — ACETAMINOPHEN 500 MG PO TABS
1000.0000 mg | ORAL_TABLET | Freq: Four times a day (QID) | ORAL | Status: AC
  Administered 2015-09-14 – 2015-09-15 (×4): 1000 mg via ORAL
  Filled 2015-09-14 (×5): qty 2

## 2015-09-14 MED ORDER — PROPOFOL 10 MG/ML IV BOLUS
INTRAVENOUS | Status: DC | PRN
  Administered 2015-09-14: 150 mg via INTRAVENOUS

## 2015-09-14 MED ORDER — CIPROFLOXACIN IN D5W 400 MG/200ML IV SOLN
400.0000 mg | Freq: Two times a day (BID) | INTRAVENOUS | Status: DC
  Administered 2015-09-14 – 2015-09-15 (×2): 400 mg via INTRAVENOUS
  Filled 2015-09-14 (×3): qty 200

## 2015-09-14 MED ORDER — TRANEXAMIC ACID 1000 MG/10ML IV SOLN
2000.0000 mg | INTRAVENOUS | Status: DC | PRN
  Administered 2015-09-14: 2000 mg via TOPICAL

## 2015-09-14 MED ORDER — SODIUM CHLORIDE 0.9 % IV SOLN: INTRAVENOUS | Status: DC

## 2015-09-14 MED ORDER — ONDANSETRON HCL 4 MG/2ML IJ SOLN: 4.0000 mg | Freq: Four times a day (QID) | INTRAMUSCULAR | Status: DC | PRN

## 2015-09-14 MED ORDER — METOCLOPRAMIDE HCL 5 MG/ML IJ SOLN
5.0000 mg | Freq: Three times a day (TID) | INTRAMUSCULAR | Status: DC | PRN
  Administered 2015-09-15: 10 mg via INTRAVENOUS
  Filled 2015-09-14: qty 2

## 2015-09-14 MED ORDER — DEXAMETHASONE SODIUM PHOSPHATE 10 MG/ML IJ SOLN
10.0000 mg | Freq: Once | INTRAMUSCULAR | Status: AC
  Administered 2015-09-15: 10 mg via INTRAVENOUS
  Filled 2015-09-14: qty 1

## 2015-09-14 MED ORDER — HYDROMORPHONE HCL 2 MG/ML IJ SOLN
INTRAMUSCULAR | Status: AC
  Filled 2015-09-14: qty 1

## 2015-09-14 MED ORDER — ACETAMINOPHEN 10 MG/ML IV SOLN
1000.0000 mg | Freq: Once | INTRAVENOUS | Status: AC
  Administered 2015-09-14: 1000 mg via INTRAVENOUS
  Filled 2015-09-14: qty 100

## 2015-09-14 MED ORDER — LACTATED RINGERS IV SOLN
INTRAVENOUS | Status: DC
  Administered 2015-09-14: 19:00:00 via INTRAVENOUS

## 2015-09-14 MED ORDER — LACTATED RINGERS IV SOLN
INTRAVENOUS | Status: DC | PRN
  Administered 2015-09-14 (×2): via INTRAVENOUS

## 2015-09-14 MED ORDER — FLEET ENEMA 7-19 GM/118ML RE ENEM: 1.0000 | ENEMA | Freq: Once | RECTAL | Status: DC | PRN

## 2015-09-14 MED ORDER — DIPHENHYDRAMINE HCL 12.5 MG/5ML PO ELIX: 12.5000 mg | ORAL_SOLUTION | ORAL | Status: DC | PRN

## 2015-09-14 MED ORDER — HYDROMORPHONE HCL 1 MG/ML IJ SOLN
INTRAMUSCULAR | Status: DC | PRN
  Administered 2015-09-14 (×5): .2 mg via INTRAVENOUS
  Administered 2015-09-14: .3 mg via INTRAVENOUS
  Administered 2015-09-14: .2 mg via INTRAVENOUS
  Administered 2015-09-14: .3 mg via INTRAVENOUS
  Administered 2015-09-14: .2 mg via INTRAVENOUS

## 2015-09-14 MED ORDER — STERILE WATER FOR IRRIGATION IR SOLN
Status: DC | PRN
  Administered 2015-09-14: 2000 mL

## 2015-09-14 MED ORDER — ACETAMINOPHEN 160 MG/5ML PO SOLN: 325.0000 mg | ORAL | Status: DC | PRN

## 2015-09-14 MED ORDER — GABAPENTIN 300 MG PO CAPS
300.0000 mg | ORAL_CAPSULE | Freq: Two times a day (BID) | ORAL | Status: DC
  Administered 2015-09-14 – 2015-09-17 (×6): 300 mg via ORAL
  Filled 2015-09-14 (×7): qty 1

## 2015-09-14 MED ORDER — PANTOPRAZOLE SODIUM 40 MG PO TBEC
40.0000 mg | DELAYED_RELEASE_TABLET | Freq: Every day | ORAL | Status: DC
  Administered 2015-09-15 – 2015-09-17 (×3): 40 mg via ORAL
  Filled 2015-09-14 (×3): qty 1

## 2015-09-14 MED ORDER — SIMVASTATIN 40 MG PO TABS
40.0000 mg | ORAL_TABLET | Freq: Every day | ORAL | Status: DC
  Administered 2015-09-14 – 2015-09-17 (×4): 40 mg via ORAL
  Filled 2015-09-14 (×4): qty 1

## 2015-09-14 MED ORDER — FENTANYL CITRATE (PF) 100 MCG/2ML IJ SOLN
INTRAMUSCULAR | Status: DC | PRN
  Administered 2015-09-14: 50 ug via INTRAVENOUS
  Administered 2015-09-14 (×6): 25 ug via INTRAVENOUS

## 2015-09-14 SURGICAL SUPPLY — 63 items
BAG SPEC THK2 15X12 ZIP CLS (MISCELLANEOUS) ×1
BAG ZIPLOCK 12X15 (MISCELLANEOUS) ×2 IMPLANT
BANDAGE ACE 6X5 VEL STRL LF (GAUZE/BANDAGES/DRESSINGS) ×2 IMPLANT
BANDAGE ESMARK 6X9 LF (GAUZE/BANDAGES/DRESSINGS) ×1 IMPLANT
BLADE SAG 18X100X1.27 (BLADE) ×1 IMPLANT
BLADE SURG SZ20 CARB STEEL (BLADE) ×1 IMPLANT
BNDG CMPR 9X6 STRL LF SNTH (GAUZE/BANDAGES/DRESSINGS) ×1
BNDG ESMARK 6X9 LF (GAUZE/BANDAGES/DRESSINGS) ×2
BONE CEMENT GENTAMICIN (Cement) ×12 IMPLANT
BOWL SMART MIX CTS (DISPOSABLE) ×2 IMPLANT
CEMENT BONE GENTAMICIN 40 (Cement) ×3 IMPLANT
CLOTH BEACON ORANGE TIMEOUT ST (SAFETY) ×1 IMPLANT
CUFF TOURN SGL QUICK 34 (TOURNIQUET CUFF) ×4
CUFF TRNQT CYL 34X4X40X1 (TOURNIQUET CUFF) ×2 IMPLANT
DRAPE EXTREMITY T 121X128X90 (DRAPE) ×2 IMPLANT
DRAPE POUCH INSTRU U-SHP 10X18 (DRAPES) ×2 IMPLANT
DRAPE U-SHAPE 47X51 STRL (DRAPES) ×2 IMPLANT
DRSG ADAPTIC 3X8 NADH LF (GAUZE/BANDAGES/DRESSINGS) ×2 IMPLANT
DRSG PAD ABDOMINAL 8X10 ST (GAUZE/BANDAGES/DRESSINGS) ×2 IMPLANT
DURAPREP 26ML APPLICATOR (WOUND CARE) ×2 IMPLANT
ELECT REM PT RETURN 9FT ADLT (ELECTROSURGICAL) ×2
ELECTRODE REM PT RTRN 9FT ADLT (ELECTROSURGICAL) ×1 IMPLANT
EVACUATOR 1/8 PVC DRAIN (DRAIN) ×3 IMPLANT
FACESHIELD WRAPAROUND (MASK) ×10 IMPLANT
FACESHIELD WRAPAROUND OR TEAM (MASK) ×5 IMPLANT
GAUZE SPONGE 4X4 12PLY STRL (GAUZE/BANDAGES/DRESSINGS) ×2 IMPLANT
GLOVE BIO SURGEON STRL SZ7.5 (GLOVE) ×2 IMPLANT
GLOVE BIO SURGEON STRL SZ8 (GLOVE) ×5 IMPLANT
GLOVE BIOGEL PI IND STRL 6.5 (GLOVE) IMPLANT
GLOVE BIOGEL PI IND STRL 7.5 (GLOVE) IMPLANT
GLOVE BIOGEL PI IND STRL 8 (GLOVE) ×1 IMPLANT
GLOVE BIOGEL PI INDICATOR 6.5 (GLOVE) ×1
GLOVE BIOGEL PI INDICATOR 7.5 (GLOVE) ×2
GLOVE BIOGEL PI INDICATOR 8 (GLOVE) ×1
GLOVE SURG SS PI 6.5 STRL IVOR (GLOVE) ×1 IMPLANT
GLOVE SURG SS PI 7.5 STRL IVOR (GLOVE) ×1 IMPLANT
GOWN STRL REUS W/TWL LRG LVL3 (GOWN DISPOSABLE) ×3 IMPLANT
GOWN STRL REUS W/TWL XL LVL3 (GOWN DISPOSABLE) ×3 IMPLANT
HANDPIECE INTERPULSE COAX TIP (DISPOSABLE) ×2
IMMOBILIZER KNEE 20 (SOFTGOODS) ×2
IMMOBILIZER KNEE 20 THIGH 36 (SOFTGOODS) IMPLANT
MANIFOLD NEPTUNE II (INSTRUMENTS) ×2 IMPLANT
PAD ABD 8X10 STRL (GAUZE/BANDAGES/DRESSINGS) ×1 IMPLANT
PADDING CAST COTTON 6X4 STRL (CAST SUPPLIES) ×3 IMPLANT
POSITIONER SURGICAL ARM (MISCELLANEOUS) ×2 IMPLANT
SET HNDPC FAN SPRY TIP SCT (DISPOSABLE) ×1 IMPLANT
STAPLER VISISTAT 35W (STAPLE) ×3 IMPLANT
STRIP CLOSURE SKIN 1/2X4 (GAUZE/BANDAGES/DRESSINGS) ×1 IMPLANT
SUCTION FRAZIER HANDLE 10FR (MISCELLANEOUS) ×1
SUCTION TUBE FRAZIER 10FR DISP (MISCELLANEOUS) ×1 IMPLANT
SUT PDS AB 1 CT1 27 (SUTURE) ×3 IMPLANT
SUT VIC AB 1 CT1 27 (SUTURE) ×6
SUT VIC AB 1 CT1 27XBRD ANTBC (SUTURE) ×3 IMPLANT
SUT VIC AB 2-0 CT1 27 (SUTURE) ×8
SUT VIC AB 2-0 CT1 27XBRD (SUTURE) IMPLANT
SUT VIC AB 2-0 CT1 TAPERPNT 27 (SUTURE) ×3 IMPLANT
SWAB COLLECTION DEVICE MRSA (MISCELLANEOUS) ×2 IMPLANT
SWAB CULTURE ESWAB REG 1ML (MISCELLANEOUS) ×2 IMPLANT
SYR 50ML LL SCALE MARK (SYRINGE) ×2 IMPLANT
TOWEL OR 17X26 10 PK STRL BLUE (TOWEL DISPOSABLE) ×2 IMPLANT
TRAY FOLEY W/METER SILVER 14FR (SET/KITS/TRAYS/PACK) ×2 IMPLANT
WRAP KNEE MAXI GEL POST OP (GAUZE/BANDAGES/DRESSINGS) ×3 IMPLANT
YANKAUER SUCT BULB TIP 10FT TU (MISCELLANEOUS) IMPLANT

## 2015-09-14 NOTE — Brief Op Note (Signed)
09/14/2015  5:46 PM  PATIENT:  [name]  70 y.o. female  PRE-OPERATIVE DIAGNOSIS:  infected right knee arthroplasty  POST-OPERATIVE DIAGNOSIS:  infected right knee arthroplasty  PROCEDURE:  Procedure(s): RIGHT TOTAL KNEE RESSECTION ARTHROPLASTY WITH ANTIBIOTIC SPACERS (Right)  SURGEON:  Surgeon(s) and Role:    * Ollen Gross, MD - Primary  PHYSICIAN ASSISTANT:   ASSISTANTS: [name], PA-C   ANESTHESIA:   general  EBL:  Total I/O In: 1600 [I.V.:1600] Out: 950 [Urine:700; Blood:250]  BLOOD ADMINISTERED:none  DRAINS: (Medium) Hemovact drain(s) in the right knee with  Suction Open   LOCAL MEDICATIONS USED:  MARCAINE     COUNTS:  YES  TOURNIQUET:   Total Tourniquet Time Documented: Thigh (Right) - 105 minutes Total: Thigh (Right) - 105 minutes   DICTATION: .Other Dictation: Dictation Number [id number]  PLAN OF CARE: Admit to inpatient   PATIENT DISPOSITION:  PACU - hemodynamically stable.

## 2015-09-14 NOTE — Interval H&P Note (Signed)
History and Physical Interval Note:  09/14/2015 3:14 PM  Andrea Norton  has presented today for surgery, with the diagnosis of infected right knee arthroplasty  The various methods of treatment have been discussed with the patient and family. After consideration of risks, benefits and other options for treatment, the patient has consented to  Procedure(s): RIGHT TOTAL KNEE RESSECTION ARTHROPLASTY WITH ANTIBIOTIC SPACERS (Right) as a surgical intervention .  The patient's history has been reviewed, patient examined, no change in status, stable for surgery.  I have reviewed the patient's chart and labs.  Questions were answered to the patient's satisfaction.     Loanne Drilling

## 2015-09-14 NOTE — H&P (Signed)
Andrea Norton is an 71 y.o. female.   Chief Complaint: Right knee pain HPI: 70 yo female with long complex history regarding her right knee. She has had multiple surgeries and has had pesristent pain in the knee with recent increase in pain and swelling. I aspirated her knee last week obtaining bloody fluid which unfortunately grew Pseudomonas. She presents now for resection arthroplasty and antibiotic spacer placement.  Past Medical History  Diagnosis Date  . Hyperlipidemia   . Status post primary angioplasty with coronary stent     DRUG-ELUTING STENT X1  TO PROXIMAL LAD  . Chronic back pain   . Chronic neck pain   . Arthritis     HANDS, KNEES  . DDD (degenerative disc disease), cervical   . DDD (degenerative disc disease), lumbar   . Radicular pain in left arm OCCASIONAL PAIN SHOOTING DOWN LEFT ARM SECAONDARY TO  CERVICAL DEGENERATION  . Hemorrhoid   . Diverticulitis of colon   . B12 deficiency anemia   . Vitamin D deficiency   . Hypothyroidism   . Synovitis of knee HYPERTROPHIC RIGHT KNEE  . Abnormal stress test 01-22-2009    LOW RISK ADENOSINE NUCLEAR STUDY W/ PROBABLE MILD APICAL THINNING BUT NO ISCHEMIA  . Depression     states well controlled  . GERD (gastroesophageal reflux disease)   . Inguinal hernia, right     with ventral hernia  . Hemorrhoids   . Coronary artery disease 2005    STRESS TEST /, NOte Dr Kirtland Bouchard EPIC/ no cardiologist now--STATES HEART STENT PLACED 2010  . Bronchitis     hx of   . H/O hiatal hernia   . Pneumonia     hx of walking pneumonia  . Hypertension       PCP Dr Lysbeth Galas; pt currently on no medication for treatment   . Sleep apnea     couldnt tolerate machine- states last sleep study " many years ago"  . Diabetes mellitus without complication (HCC)     pt states since having gastric banding does not have take medication was on metformin in past  . History of urinary tract infection   . Urinary incontinence     Past Surgical History   Procedure Laterality Date  . Knee closed reduction  05-07-2009  RIGHT KNEE    LEFT KNEE  10-01-2009  . Total knee arthroplasty  LEFT  2005    RIGHT  03-13-2009  . Total knee revision  RIGHT 09-30-2009    LEFT 11-25-2009  . Hysteroscopy w/d&c  2002  . Open patellofemoral right knee/ lateral release  05-04-2009  . Knee arthroscopy w/ debridement  01-22-2010    SEPTIC KNEE  . Right knee i & d polythylene revision  02-04-2010    KNEE INFECTED  . Left foot surg.  1990  . Appendectomy  1993  . Knee arthroscopy  LEFT 1999    RIGHT 2004  . Hemicolectomy for diverticulitis  2005  . Knee arthroscopy  08/03/2011    Procedure: ARTHROSCOPY KNEE;  Surgeon: Loanne Drilling;  Location: [facility];  Service: Orthopedics;  Laterality: Right;  . Synovectomy  08/03/2011    Procedure: SYNOVECTOMY;  Surgeon: Loanne Drilling;  Location: [facility];  Service: Orthopedics;  Laterality: Right;  . I&d knee with poly exchange  02/01/2012    Procedure: IRRIGATION AND DEBRIDEMENT KNEE WITH POLY EXCHANGE;  Surgeon: Loanne Drilling, MD;  Location: [facility];  Service: Orthopedics;  Laterality: Right;  Right  Knee Polyethlene Revision   . Colon surgery    . Total knee revision Right 02/20/2013    Procedure: RIGHT TOTAL KNEE ARTHROPLASTY REVISION VERSUS RESECTION ARTHROPLASTY;  Surgeon: Loanne Drilling, MD;  Location: [facility];  Service: Orthopedics;  Laterality: Right;  . Joint replacement      right knee   . Coronary angioplasty  2004    DRUG-ELUTING STENT X1 TO PROXIMAL  LAD  . Cataract extraction w/phaco Right 05/06/2014    Procedure: CATARACT EXTRACTION PHACO AND INTRAOCULAR LENS PLACEMENT (IOC);  Surgeon: Loraine Leriche T. Nile Riggs, MD;  Location: [facility];  Service: Ophthalmology;  Laterality: Right;  CDE 6.41  . Total knee revision Right 04/02/2014    Procedure: RIGHT KNEE FEMORAL ARTHROPLASTY REVISION;  Surgeon: Loanne Drilling, MD;  Location: [facility];  Service: Orthopedics;  Laterality: Right;  .  Cataract extraction w/phaco Left 05/20/2014    Procedure: CATARACT EXTRACTION PHACO AND INTRAOCULAR LENS PLACEMENT (IOC);  Surgeon: Loraine Leriche T. Nile Riggs, MD;  Location: [facility];  Service: Ophthalmology;  Laterality: Left;  CDE:4.42  . Laparoscopic gastric sleeve resection N/A 07/21/2014    Procedure: LAPAROSCOPIC GASTRIC SLEEVE RESECTION, TAKEDOWN OF INCARCERATED VENTRAL HERNIA, ENTERAL LYSIS, UPPER ENDOSCOPY;  Surgeon: Valarie Merino, MD;  Location: [facility];  Service: General;  Laterality: N/A;  . Knee arthroscopy Right 12/17/2014    Procedure: RIGHT ARTHROSCOPY KNEE, SYNOVECTOMY;  Surgeon: Ollen Gross, MD;  Location: [facility];  Service: Orthopedics;  Laterality: Right;  . Irrigation and debridement knee Right 03/10/2015    Procedure: IRRIGATION AND DEBRIDEMENT of right KNEE;  Surgeon: Ollen Gross, MD;  Location: [facility];  Service: Orthopedics;  Laterality: Right;  . Quadriceps tendon repair Right 06/02/2015    Procedure: RIGHT KNEE QUADRICEP TENDON REPAIR ;  Surgeon: Ollen Gross, MD;  Location: [facility];  Service: Orthopedics;  Laterality: Right;  . Eye surgery      cataract surgery bilat     Family History  Problem Relation Age of Onset  . COPD Mother   . Cancer Father     lung   Social History:  reports that she quit smoking about 13 years ago. Her smoking use included Cigarettes. She has a 30 pack-year smoking history. She has never used smokeless tobacco. She reports that she does not drink alcohol or use illicit drugs.  Allergies: No Known Allergies  Medications Prior to Admission  Medication Sig Dispense Refill  . aspirin EC 81 MG tablet Take 81 mg by mouth daily.    . calcium citrate-vitamin D 500-400 MG-UNIT chewable tablet Chew 1 tablet by mouth 3 (three) times daily. Spaces tabs ~2-3 hrs apart around lunchtime    . cyanocobalamin (,VITAMIN B-12,) 1000 MCG/ML injection Inject 1,000 mcg into the skin every 30 (thirty) days.    Marland Kitchen escitalopram (LEXAPRO) 10 MG tablet Take 10 mg by mouth  at bedtime.    . gabapentin (NEURONTIN) 100 MG capsule Take 300 mg by mouth 2 (two) times daily.     Marland Kitchen HYDROmorphone (DILAUDID) 2 MG tablet Take 1-2 tablets (2-4 mg total) by mouth every 4 (four) hours as needed for moderate pain or severe pain. 80 tablet 0  . levothyroxine (SYNTHROID, LEVOTHROID) 125 MCG tablet Take 125 mcg by mouth daily before breakfast. Brand Name Only    . methocarbamol (ROBAXIN) 500 MG tablet Take 1 tablet (500 mg total) by mouth every 6 (six) hours as needed for muscle spasms. 80 tablet 0  . pantoprazole (PROTONIX) 40 MG tablet Take 40 mg by  mouth daily with lunch.     . Pediatric Multiple Vit-C-FA (FLINSTONES GUMMIES OMEGA-3 DHA PO) Take 1 each by mouth 2 (two) times daily.    Marland Kitchen PRESCRIPTION MEDICATION Apply 1 application topically 2 (two) times daily as needed (Itching). 3 in one cream. Ketoconazole Cream 2%, SSD cream 1%, and Triamcinolone Cream 0.1%. Compounded at [facility] in [place]    . simvastatin (ZOCOR) 40 MG tablet Take 40 mg by mouth daily.    . Vitamin D, Ergocalciferol, (DRISDOL) 50000 UNITS CAPS capsule Take 50,000 Units by mouth every 7 (seven) days. Fridays    . aspirin 325 MG tablet Take 325 mg by mouth daily.      Results for orders placed or performed during the hospital encounter of 09/14/15 (from the past 48 hour(s))  Glucose, capillary     Status: None   Collection Time: 09/14/15  1:34 PM  Result Value Ref Range   Glucose-Capillary 82 65 - 99 mg/dL   Comment 1 Document in Chart    No results found.  ROS  Blood pressure 128/75, pulse 70, temperature 97.9 F (36.6 C), temperature source Oral, resp. rate 16, height 5' 4.5" (1.638 m), weight 103.137 kg (227 lb 6 oz), SpO2 95 %. Physical Exam Physical Examination: General appearance - alert, well appearing, and in no distress Mental status - alert, oriented to person, place, and time Chest - clear to auscultation, no wheezes, rales or rhonchi, symmetric air entry Heart - normal rate, regular  rhythm, normal S1, S2, no murmurs, rubs, clicks or gallops Abdomen - soft, nontender, nondistended, no masses or organomegaly Neurological - alert, oriented, normal speech, no focal findings or movement disorder noted Right knee swollen and tender. Positive effusion and warmth   Assessment/Plan Right knee septic arthritis- Plan right knee resection arthroplasty with antibiotic spacer placement. Discussed in detail with patient who elects to proceed.  Loanne Drilling, MD 09/14/2015, 1:48 PM

## 2015-09-14 NOTE — Transfer of Care (Signed)
Immediate Anesthesia Transfer of Care Note  Patient: Andrea Norton  Procedure(s) Performed: Procedure(s): RIGHT TOTAL KNEE RESSECTION ARTHROPLASTY WITH ANTIBIOTIC SPACERS (Right)  Patient Location: PACU  Anesthesia Type:General  Level of Consciousness: awake, alert , oriented and patient cooperative  Airway & Oxygen Therapy: Patient Spontanous Breathing and Patient connected to face mask oxygen  Post-op Assessment: Report given to RN, Post -op Vital signs reviewed and stable and Patient moving all extremities  Post vital signs: Reviewed and stable  Last Vitals:  Filed Vitals:   09/14/15 1343  BP: 128/75  Pulse: 70  Temp: 36.6 C  Resp: 16    Complications: No apparent anesthesia complications

## 2015-09-14 NOTE — Op Note (Signed)
Andrea Norton, Andrea Norton NO.:  [id number]  MEDICAL RECORD NO.:  [id number]  LOCATION:  1604                         FACILITY:  [facility]  PHYSICIAN:  Ollen Gross, M.D.    DATE OF BIRTH:  June 13, 1946  DATE OF PROCEDURE:  09/14/2015 DATE OF DISCHARGE:                              OPERATIVE REPORT   PREOPERATIVE DIAGNOSIS:  Infected right total knee arthroplasty.  POSTOPERATIVE DIAGNOSIS:  Infected right total knee arthroplasty.  PROCEDURE:  Right knee resection arthroplasty, antibiotic spacer.  SURGEON:  Ollen Gross, M.D.  ASSISTANT:  Alexzandrew L. Perkins, P.A.C.  ANESTHESIA:  General.  ESTIMATED BLOOD LOSS:  Minimal.  DRAINS:  Hemovac x2.  One in the joint and one in the subcu.  COMPLICATIONS:  None.  CONDITION:  Stable to recovery.  BRIEF CLINICAL NOTE:  Andrea Norton is a 70 year old female, very long complex history in regard to this right knee.  She has had multiple procedures.  She has developed painful swelling in the knee and unfortunately has infection.  She had an aspirin in the office last week showing Pseudomonas.  The second time, she has had Pseudomonas.  At this time, we discussed the more definitive treatment with resection arthroplasty, antibiotic spacer placement and eventual two-stage revision.  She presents now for stage I, which is the resection arthroplasty and antibiotic spacer.  PROCEDURE IN DETAIL:  After successful administration of general anesthetic, a tourniquet was placed high on the right thigh.  Right lower extremity was prepped and draped in usual sterile fashion. Extremity was wrapped in Esmarch, tourniquet was inflated to 300 mmHg. Midline incision was made with 10-blade through the subcutaneous tissue to the extensor mechanism to make a medial parapatellar arthrotomy.  The bloody-type fluid was sent for Gram stain, culture and sensitivity as well as anaerobic culture.  I removed the large amount of scar tissue from the  joint.  Soft tissue over the proximal medial tibia was then subperiosteally elevated to the joint line with a knife and into the semimembranosus bursa with a Cobb elevator.  Soft tissue laterally was elevated with attention being paid to avoid the patellar tendon on tibial tubercle.  She had a hinged knee in.  I was able to isolate the pin for the hinge and removed that.  This uncoupled the femur from the tibia.  The polyethylene insert was then removed.  We started on the femoral side.  I disrupted the interface between the femoral component and bone with osteotomes.  Using bone tamp and mallet, I was then able to disrupt the junction between the femoral component and the femoral sleeve and the component was easily removed with no bone loss.  I had to use flexible osteotomes to disrupt the interface between the femoral sleeve and the femur and once I did that, I was able to remove the sleeve and the stem without difficulty without bone loss.  The tibia was much more difficult to extract.  She had a large built-up tibial platform with a cemented sleeve.  I disrupted the interface proximally.  I had to use flexible osteotomes to try to disrupt the interface between the sleeve and the tibia.  She had a  lot of bone loss already and this was magnified by trying to remove this component. Eventually, I was able to remove the component and then removal of cement from the tibial canal as well as a cement restrictor.  After doing this, the wound was copiously irrigated with 3 liters of saline with pulsatile lavage.  I then removed the patellar component with a saw.  Then mixed 6 batches of gentamicin impregnated cement.  We needed this much to fill the large gap between the femur and tibia.  Once it was molded into position and once it was beginning to harden, I placed it into the extension space.  We had good stability with it.  We irrigated further and then placed a Hemovac drain deep.  The  arthrotomy was closed over the drain with an interrupted #1 PDS sutures.  The tourniquet was then released with total time of 105 minutes.  Second drain was placed in the subcu and subcu closed with interrupted 2-0 Vicryl.  Prior to closing this, I injected the extensor mechanism with approximately 30 mL of 0.25% Marcaine.  After the subcu was closed, then we closed the skin with staples.  The drains were hooked to suction, incision was cleaned and dried and bulky sterile dressing applied.  She was placed into a knee immobilizer, awakened and transported to recovery in stable condition.  Of note, that the surgical assistant is an absolute medical necessity in this procedure.  This was a very difficult extraction of the prosthetics and the assistance necessary for proper placement of the limb and proper retraction and protection of the vital neurovascular structures.     Ollen Gross, M.D.     FA/MEDQ  D:  09/14/2015  T:  09/14/2015  Job:  [id number]

## 2015-09-14 NOTE — Anesthesia Preprocedure Evaluation (Signed)
Anesthesia Evaluation  Patient identified by MRN, date of birth, ID band Patient awake    Reviewed: Allergy & Precautions, H&P , NPO status , Patient's Chart, lab work & pertinent test results  History of Anesthesia Complications Negative for: history of anesthetic complications  Airway Mallampati: III  TM Distance: <3 FB Neck ROM: Full    Dental no notable dental hx. (+) Dental Advisory Given, Teeth Intact,    Pulmonary shortness of breath and with exertion, sleep apnea , former smoker,    Pulmonary exam normal breath sounds clear to auscultation       Cardiovascular hypertension, Pt. on medications + CAD and + Cardiac Stents  Normal cardiovascular exam Rhythm:Regular Rate:Normal     Neuro/Psych PSYCHIATRIC DISORDERS Depression negative neurological ROS  negative psych ROS   GI/Hepatic negative GI ROS, Neg liver ROS, hiatal hernia, GERD  Medicated and Controlled,  Endo/Other  diabetesHypothyroidism Morbid obesity  Renal/GU negative Renal ROS  negative genitourinary   Musculoskeletal negative musculoskeletal ROS (+) Arthritis ,   Abdominal (+) + obese,   Peds negative pediatric ROS (+)  Hematology negative hematology ROS (+)   Anesthesia Other Findings   Reproductive/Obstetrics negative OB ROS                             Anesthesia Physical Anesthesia Plan  ASA: II  Anesthesia Plan: General   Post-op Pain Management:    Induction: Intravenous  Airway Management Planned: LMA  Additional Equipment: None  Intra-op Plan:   Post-operative Plan:   Informed Consent: I have reviewed the patients History and Physical, chart, labs and discussed the procedure including the risks, benefits and alternatives for the proposed anesthesia with the patient or authorized representative who has indicated his/her understanding and acceptance.   Dental advisory given  Plan Discussed with:  CRNA and Surgeon  Anesthesia Plan Comments:         Anesthesia Quick Evaluation

## 2015-09-15 ENCOUNTER — Encounter (HOSPITAL_COMMUNITY): Payer: Self-pay | Admitting: Orthopedic Surgery

## 2015-09-15 DIAGNOSIS — M00861 Arthritis due to other bacteria, right knee: Secondary | ICD-10-CM

## 2015-09-15 DIAGNOSIS — Y838 Other surgical procedures as the cause of abnormal reaction of the patient, or of later complication, without mention of misadventure at the time of the procedure: Secondary | ICD-10-CM

## 2015-09-15 DIAGNOSIS — B965 Pseudomonas (aeruginosa) (mallei) (pseudomallei) as the cause of diseases classified elsewhere: Secondary | ICD-10-CM

## 2015-09-15 DIAGNOSIS — T8453XD Infection and inflammatory reaction due to internal right knee prosthesis, subsequent encounter: Secondary | ICD-10-CM

## 2015-09-15 DIAGNOSIS — T8453XA Infection and inflammatory reaction due to internal right knee prosthesis, initial encounter: Secondary | ICD-10-CM

## 2015-09-15 LAB — BASIC METABOLIC PANEL
ANION GAP: 6 (ref 5–15)
BUN: 14 mg/dL (ref 6–20)
CALCIUM: 8.9 mg/dL (ref 8.9–10.3)
CO2: 26 mmol/L (ref 22–32)
Chloride: 107 mmol/L (ref 101–111)
Creatinine, Ser: 0.72 mg/dL (ref 0.44–1.00)
GLUCOSE: 145 mg/dL — AB (ref 65–99)
POTASSIUM: 5.2 mmol/L — AB (ref 3.5–5.1)
Sodium: 139 mmol/L (ref 135–145)

## 2015-09-15 LAB — CBC
HEMATOCRIT: 34.5 % — AB (ref 36.0–46.0)
Hemoglobin: 10.7 g/dL — ABNORMAL LOW (ref 12.0–15.0)
MCH: 28 pg (ref 26.0–34.0)
MCHC: 31 g/dL (ref 30.0–36.0)
MCV: 90.3 fL (ref 78.0–100.0)
Platelets: 262 10*3/uL (ref 150–400)
RBC: 3.82 MIL/uL — AB (ref 3.87–5.11)
RDW: 13.6 % (ref 11.5–15.5)
WBC: 9 10*3/uL (ref 4.0–10.5)

## 2015-09-15 MED ORDER — CIPROFLOXACIN HCL 750 MG PO TABS
750.0000 mg | ORAL_TABLET | Freq: Two times a day (BID) | ORAL | Status: DC
  Administered 2015-09-15 – 2015-09-17 (×4): 750 mg via ORAL
  Filled 2015-09-15 (×6): qty 1

## 2015-09-15 NOTE — Care Management Note (Signed)
Case Management Note  Patient Details  Name: Andrea Norton MRN: [id number] Date of Birth: [birth year]  Subjective/Objective:                  Right knee resection arthroplasty, antibiotic spacer. Action/Plan: Discharge planning Expected Discharge Date:  09/16/15               Expected Discharge Plan:  Home/Self Care  In-House Referral:     Discharge planning Services  CM Consult  Post Acute Care Choice:    Choice offered to:     DME Arranged:  N/A DME Agency:  NA  HH Arranged:  NA HH Agency:     Status of Service:  Completed, signed off  Medicare Important Message Given:    Date Medicare IM Given:    Medicare IM give by:    Date Additional Medicare IM Given:    Additional Medicare Important Message give by:     If discussed at [place] of Stay Meetings, dates discussed:    Additional Comments: CM met with pt, discussed with MD, and revisited with PT and pt and pt feels comfortable in going home without HHPT.  Pt has all DME at home from previous surgery.  No other CM needs were communicated. Dellie Catholic, RN 09/15/2015, 3:13 PM

## 2015-09-15 NOTE — Progress Notes (Signed)
Subjective: 1 Day Post-Op Procedure(s) (LRB): RIGHT TOTAL KNEE RESSECTION ARTHROPLASTY WITH ANTIBIOTIC SPACERS (Right) Patient reports pain as mild and moderate.   Patient seen in rounds with Dr. Lequita Halt.  Sitting up in bed.  In good spirits.  Briefly discussed the surgical findings and the procedure. Will get I.D. Consult for recommendations.  Will hold off on PICC at this time until consult.  Slight possibility may only need oral antibiotics. Patient is well, but has had some minor complaints of pain in the knee, requiring pain medications We will start therapy today. Has antibiotic spacer in at this time.  No bending or motion to the right knee.  Knee immobilizer at all times. Plan is to go Home after hospital stay if does well and progresses with therapy.    Objective: Vital signs in last 24 hours: Temp:  [97.5 F (36.4 C)-98.5 F (36.9 C)] 97.9 F (36.6 C) (02/21 0549) Pulse Rate:  [70-116] 85 (02/21 0549) Resp:  [12-22] 16 (02/21 0549) BP: (100-128)/(46-75) 103/62 mmHg (02/21 0549) SpO2:  [95 %-100 %] 99 % (02/21 0549) Weight:  [103.137 kg (227 lb 6 oz)] 103.137 kg (227 lb 6 oz) (02/20 1343)  Intake/Output from previous day:  Intake/Output Summary (Last 24 hours) at 09/15/15 0832 Last data filed at 09/15/15 0800  Gross per 24 hour  Intake   3470 ml  Output   2535 ml  Net    935 ml    Intake/Output this shift: Total I/O In: 120 [P.O.:120] Out: -   Labs:  Recent Labs  09/15/15 0418  HGB 10.7*    Recent Labs  09/15/15 0418  WBC 9.0  RBC 3.82*  HCT 34.5*  PLT 262    Recent Labs  09/15/15 0418  NA 139  K 5.2*  CL 107  CO2 26  BUN 14  CREATININE 0.72  GLUCOSE 145*  CALCIUM 8.9   No results for input(s): LABPT, INR in the last 72 hours.  EXAM General - Patient is Alert, Appropriate and Oriented Extremity - Neurovascular intact Sensation intact distally Dorsiflexion/Plantar flexion intact Dressing - dressing C/D/I Motor Function - intact,  moving foot and toes well on exam.  Hemovac pulled without difficulty.  Past Medical History  Diagnosis Date  . Hyperlipidemia   . Status post primary angioplasty with coronary stent     DRUG-ELUTING STENT X1  TO PROXIMAL LAD  . Chronic back pain   . Chronic neck pain   . Arthritis     HANDS, KNEES  . DDD (degenerative disc disease), cervical   . DDD (degenerative disc disease), lumbar   . Radicular pain in left arm OCCASIONAL PAIN SHOOTING DOWN LEFT ARM SECAONDARY TO  CERVICAL DEGENERATION  . Hemorrhoid   . Diverticulitis of colon   . B12 deficiency anemia   . Vitamin D deficiency   . Hypothyroidism   . Synovitis of knee HYPERTROPHIC RIGHT KNEE  . Abnormal stress test 01-22-2009    LOW RISK ADENOSINE NUCLEAR STUDY W/ PROBABLE MILD APICAL THINNING BUT NO ISCHEMIA  . Depression     states well controlled  . GERD (gastroesophageal reflux disease)   . Inguinal hernia, right     with ventral hernia  . Hemorrhoids   . Coronary artery disease 2005    STRESS TEST /, NOte Dr Kirtland Bouchard EPIC/ no cardiologist now--STATES HEART STENT PLACED 2010  . Bronchitis     hx of   . H/O hiatal hernia   . Pneumonia     hx  of walking pneumonia  . Hypertension       PCP Dr Lysbeth Galas; pt currently on no medication for treatment   . Sleep apnea     couldnt tolerate machine- states last sleep study " many years ago"  . Diabetes mellitus without complication (HCC)     pt states since having gastric banding does not have take medication was on metformin in past  . History of urinary tract infection   . Urinary incontinence     Assessment/Plan: 1 Day Post-Op Procedure(s) (LRB): RIGHT TOTAL KNEE RESSECTION ARTHROPLASTY WITH ANTIBIOTIC SPACERS (Right) Principal Problem:   Septic arthritis of knee, right (HCC)  Estimated body mass index is 38.44 kg/(m^2) as calculated from the following:   Height as of this encounter: 5' 4.5" (1.638 m).   Weight as of this encounter: 103.137 kg (227 lb 6  oz). Advance diet Up with therapy Discharge home with home health  Will hold on PICC for now unless I.D. Consult recommends PICC.  DVT Prophylaxis - Xarelto Weight-Bearing as tolerated to right leg D/C O2 and Pulse OX and try on Room Air  I.D. Consult - Past History 05/25/2004 - Left Total Knee Arthroplasty - Dr. Chaney Malling Right Total Knee - performed by Dr. Kristeen Miss 05/04/2009 - Patella Realignment  - Dr. Madelon Lips 05/07/2009 - Closed Reduction of Right TKA Dislcoation Second Opinion by Dr. Lequita Halt - 07/15/2009 09/30/2009 - Revision Right Total Knee Arthroplasty - Dr. Lequita Halt 3/10 2011 - Clsoed Reduction of Left TKA Dislocation - Dr. Lequita Halt 11/25/2009 - Revision Left Total Knee Arthroplasty - Dr. Lequita Halt 01/21/2010 - Office Visit - Aspiration of Right Knee (dont have cultures - sent out) 01/22/2010 - Right Knee Arthroscopic Debridement (Right Knee Sepsis) - Culture Neg, IV Vancomycin 02/02/2010 - Office Visit - Previous Final Culture positive for Pseudomonas 02/04/2010 - Formal I&D and Poly Exchange Right Knee - Oral Cipro for 6 Weeks  08/03/2011 - Right Knee Arthroscopic Debridement and Synovectomy (hypertrophic synovitis) 02/01/2012 - Right Knee Poly Revision (Right Knee Instability) 02/21/2012 - Right Total Knee Revision (Right Knee Loosening)  11/01/2013 - Office Blood Work: Sed Rate - 30, CRP -1.3 04/02/2014 - Right Femoral Revision (Failed painful Right TKA)  12/17/2014 - Right Knee Arthroscopic Debridement and Synovectomy (hypertrophic synovitis) 03/07/2015 - Admitted for Cellulitis Right Knee - IV ABX 03/10/2015 - Formal I&D Right Knee - Culture positive for PSEUDOMONAS AERUGINOSA - Treated with Oral Cipro 06/02/2015 - Repair of Right Quad Tendon Rupture (operative note stated no clinical signs of infection)  09/03/2015 - Office Visit - Aspiration of Right Knee - Culture positive for PSEUDOMONAS AERUGINOSA.  Culture and Sensitivity Report are located on the hard chart at nurses  station.  [name], PA-C Orthopaedic Surgery 09/15/2015, 8:32 AM

## 2015-09-15 NOTE — Progress Notes (Signed)
Physical Therapy Treatment Patient Details Name: Andrea Norton MRN: [id number] DOB: 1945-10-29 Today's Date: 09/15/2015    History of Present Illness Pt s/p R TKR resection with antibiotic spacer    PT Comments    Pt progressing slowly with mobility and with increased time required to complete tasks.    Follow Up Recommendations  No PT follow up     Equipment Recommendations  None recommended by PT    Recommendations for Other Services       Precautions / Restrictions Precautions Precautions: Knee;Fall Required Braces or Orthoses: Knee Immobilizer - Right Knee Immobilizer - Right: On at all times Restrictions Weight Bearing Restrictions: No Other Position/Activity Restrictions: WBAT    Mobility  Bed Mobility Overal bed mobility: Needs Assistance Bed Mobility: Sit to Supine       Sit to supine: Min assist   General bed mobility comments: cues for sequence and use of L LE to self assist.  Physical assist required to manage R LE  Transfers Overall transfer level: Needs assistance Equipment used: Rolling walker (2 wheeled) Transfers: Sit to/from Stand Sit to Stand: Min assist         General transfer comment: cues for LE management and use of UEs to self assist  Ambulation/Gait Ambulation/Gait assistance: Min assist Ambulation Distance (Feet): 10 Feet Assistive device: Rolling walker (2 wheeled) Gait Pattern/deviations: Step-to pattern;Decreased step length - right;Decreased step length - left;Shuffle;Trunk flexed Gait velocity: decr   General Gait Details: Cues for sequence, posture and position from RW.  Pt tolerating min WB on R LE   Stairs            Wheelchair Mobility    Modified Rankin (Stroke Patients Only)       Balance                                    Cognition Arousal/Alertness: Awake/alert Behavior During Therapy: WFL for tasks assessed/performed Overall Cognitive Status: Within Functional Limits for tasks  assessed                      Exercises      General Comments        Pertinent Vitals/Pain Pain Assessment: 0-10 Pain Score: 8  Pain Location: R knee with activity Pain Descriptors / Indicators: Aching;Sore Pain Intervention(s): Limited activity within patient's tolerance;Monitored during session;Premedicated before session;Ice applied    Home Living                      Prior Function            PT Goals (current goals can now be found in the care plan section) Acute Rehab PT Goals Patient Stated Goal: Regain IND PT Goal Formulation: With patient Time For Goal Achievement: 09/19/15 Potential to Achieve Goals: Good Progress towards PT goals: Progressing toward goals    Frequency  7X/week    PT Plan Current plan remains appropriate    Co-evaluation             End of Session Equipment Utilized During Treatment: Gait belt;Right knee immobilizer Activity Tolerance: Patient limited by pain Patient left: in bed;with call bell/phone within reach     Time: 4010-2725 PT Time Calculation (min) (ACUTE ONLY): 27 min  Charges:  $Gait Training: 8-22 mins  G Codes:      Seydou Hearns 2015/09/26, 4:14 PM

## 2015-09-15 NOTE — Progress Notes (Signed)
Utilization review completed.  

## 2015-09-15 NOTE — Discharge Instructions (Addendum)
Information on my medicine - XARELTO (Rivaroxaban)  This medication education was reviewed with me or my healthcare representative as part of my discharge preparation.  The pharmacist that spoke with me during my hospital stay was:  Maurice March [facility]  Why was Xarelto prescribed for you? Xarelto was prescribed for you to reduce the risk of blood clots forming after orthopedic surgery. The medical term for these abnormal blood clots is venous thromboembolism (VTE).  What do you need to know about xarelto ? Take your Xarelto ONCE DAILY at the same time every day. You may take it either with or without food.  If you have difficulty swallowing the tablet whole, you may crush it and mix in applesauce just prior to taking your dose.  Take Xarelto exactly as prescribed by your doctor and DO NOT stop taking Xarelto without talking to the doctor who prescribed the medication.  Stopping without other VTE prevention medication to take the place of Xarelto may increase your risk of developing a clot.  After discharge, you should have regular check-up appointments with your healthcare provider that is prescribing your Xarelto.    What do you do if you miss a dose? If you miss a dose, take it as soon as you remember on the same day then continue your regularly scheduled once daily regimen the next day. Do not take two doses of Xarelto on the same day.   Important Safety Information A possible side effect of Xarelto is bleeding. You should call your healthcare provider right away if you experience any of the following: ? Bleeding from an injury or your nose that does not stop. ? Unusual colored urine (red or dark brown) or unusual colored stools (red or black). ? Unusual bruising for unknown reasons. ? A serious fall or if you hit your head (even if there is no bleeding).  Some medicines may interact with Xarelto and might increase your risk of bleeding while on Xarelto. To help avoid  this, consult your healthcare provider or pharmacist prior to using any new prescription or non-prescription medications, including herbals, vitamins, non-steroidal anti-inflammatory drugs (NSAIDs) and supplements.  This website has more information on Xarelto: VisitDestination.com.br.   Pick up stool softner and laxative for home use following surgery while on pain medications. Do not submerge incision under water. Please use good hand washing techniques while changing dressing each day. May shower starting three days after surgery. Please use a clean towel to pat the incision dry following showers. Continue to use ice for pain and swelling after surgery. Do not use any lotions or creams on the incision until instructed by your surgeon.  Take Xarelto for two and a half more weeks, then discontinue Xarelto. Once the patient has completed the Xarelto, they may resume the 81 mg Aspirin.  Right Knee Immobilizer At All Times. No bending or range of motion to the right leg. Continue to elevate and ice the knee for pain and swelling.

## 2015-09-15 NOTE — Progress Notes (Signed)
OT Cancellation Note  Patient Details Name: Andrea Norton MRN: [id number] DOB: January 31, 1946   Cancelled Treatment:    Reason Eval/Treat Not Completed: OT screened, no needs identified, will sign off  Pt has all needed DME and is aware of strategies to perform ADL activity  Thanks, Lise Auer, OT [phone]  Einar Crow D 09/15/2015, 11:12 AM

## 2015-09-15 NOTE — Evaluation (Signed)
Physical Therapy Evaluation Patient Details Name: Andrea Norton MRN: [id number] DOB: 1945/12/22 Today's Date: 09/15/2015   History of Present Illness  Pt s/p R TKR resection with antibiotic spacer  Clinical Impression  Pt s/p R TKR resection presents with decreased R LE strength/ROM and post op pain limiting functional mobility.  Pt should progress to dc home with family assist.    Follow Up Recommendations No PT follow up    Equipment Recommendations  None recommended by PT    Recommendations for Other Services       Precautions / Restrictions Precautions Precautions: Knee;Fall Required Braces or Orthoses: Knee Immobilizer - Right Knee Immobilizer - Right: On at all times Restrictions Weight Bearing Restrictions: No Other Position/Activity Restrictions: WBAT      Mobility  Bed Mobility Overal bed mobility: Needs Assistance Bed Mobility: Supine to Sit     Supine to sit: Min assist     General bed mobility comments: cues for sequence and use of L LE to self assist.  Physical assist required to manage R LE  Transfers Overall transfer level: Needs assistance Equipment used: Rolling walker (2 wheeled) Transfers: Sit to/from Stand Sit to Stand: Min assist         General transfer comment: cues for LE management and use of UEs to self assist  Ambulation/Gait Ambulation/Gait assistance: Min assist;Mod assist Ambulation Distance (Feet): 8 Feet Assistive device: Rolling walker (2 wheeled) Gait Pattern/deviations: Step-to pattern;Decreased step length - right;Decreased step length - left;Shuffle;Trunk flexed Gait velocity: decr   General Gait Details: Cues for sequence, posture and position from RW.  Pt tolerating min WB on R LE  Stairs            Wheelchair Mobility    Modified Rankin (Stroke Patients Only)       Balance                                             Pertinent Vitals/Pain Pain Assessment: 0-10 Pain Score: 8   Pain Location: R knee Pain Descriptors / Indicators: Aching;Sore;Grimacing Pain Intervention(s): Limited activity within patient's tolerance;Monitored during session;Premedicated before session;Ice applied    Home Living Family/patient expects to be discharged to:: Private residence Living Arrangements: Spouse/significant other Available Help at Discharge: Family Type of Home: House Home Access: Stairs to enter Entrance Stairs-Rails: Doctor, general practice of Steps: 4 Home Layout: Able to live on main level with bedroom/bathroom;One level Home Equipment: Walker - standard;Cane - single point;Bedside commode;Tub bench;Hand held shower head      Prior Function Level of Independence: Independent with assistive device(s)               Hand Dominance        Extremity/Trunk Assessment   Upper Extremity Assessment: Overall WFL for tasks assessed           Lower Extremity Assessment: RLE deficits/detail RLE Deficits / Details: KI in place.  Pt requiring mod assist to manage R LE in/out of bed     Cervical / Trunk Assessment: Normal  Communication   Communication: No difficulties  Cognition Arousal/Alertness: Awake/alert Behavior During Therapy: WFL for tasks assessed/performed Overall Cognitive Status: Within Functional Limits for tasks assessed                      General Comments      Exercises Total Joint Exercises  Ankle Circles/Pumps: AROM;Both;15 reps;Supine      Assessment/Plan    PT Assessment Patient needs continued PT services  PT Diagnosis Difficulty walking   PT Problem List Decreased strength;Decreased range of motion;Decreased activity tolerance;Decreased mobility;Decreased knowledge of use of DME;Pain  PT Treatment Interventions DME instruction;Gait training;Stair training;Functional mobility training;Therapeutic activities;Therapeutic exercise;Patient/family education   PT Goals (Current goals can be found in the Care Plan  section) Acute Rehab PT Goals Patient Stated Goal: Regain IND PT Goal Formulation: With patient Time For Goal Achievement: 09/19/15 Potential to Achieve Goals: Good    Frequency 7X/week   Barriers to discharge        Co-evaluation               End of Session Equipment Utilized During Treatment: Gait belt;Right knee immobilizer Activity Tolerance: Patient tolerated treatment well;Patient limited by pain Patient left: in chair;with call bell/phone within reach;with family/visitor present Nurse Communication: Mobility status         Time: 1610-9604 PT Time Calculation (min) (ACUTE ONLY): 25 min   Charges:   PT Evaluation $PT Eval Low Complexity: 1 Procedure PT Treatments $Gait Training: 8-22 mins   PT G Codes:        Shirly Bartosiewicz 09/17/15, 12:35 PM

## 2015-09-15 NOTE — Consult Note (Signed)
[facility]    Date of Admission:  09/14/2015           Day 1 ciprofloxacin       Reason for Consult: Pseudomonas prosthetic right knee infection    Referring Physician: Dr. Ollen Gross  Principal Problem:   Infection of prosthetic right knee joint (HCC) Active Problems:   Osteoarthritis   Hypothyroidism   Hyperlipidemia   OBESITY, MORBID   Essential hypertension   Coronary atherosclerosis   GASTROESOPHAGEAL REFLUX DISEASE   Sleep apnea   . acetaminophen  1,000 mg Oral 4 times per day  . ciprofloxacin  750 mg Oral BID  . docusate sodium  100 mg Oral BID  . escitalopram  10 mg Oral QHS  . gabapentin  300 mg Oral BID  . levothyroxine  125 mcg Oral QAC breakfast  . pantoprazole  40 mg Oral Q lunch  . rivaroxaban  10 mg Oral Q breakfast  . simvastatin  40 mg Oral Daily    Recommendations: 1. Agree with ciprofloxacin. I have changed it to ciprofloxacin 750 mg by mouth twice daily and instructed her not to take this with her usual calcium supplements, iron or other heavy metals.   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 Her recent Pseudomonas isolate remains susceptible to ciprofloxacin. I changed ciprofloxacin over to the oral form. She has taken calcium intermittently in the past. I instructed her not to take calcium, iron, magnesium or other heavy metals while she is on ciprofloxacin as these can interfere with absorption. She will need at least 6 weeks of therapy.    HPI: Andrea Norton is a 70 y.o. female multiple medical problems including degenerative joint disease. She underwent right total knee arthroplasty in 2006. She has required multiple surgeries on that knee since that time (see time line constructed by [name] attached below). She's been treated for Pseudomonas infection of her right prosthetic knee on 2 previous occasions, the first in 2011 and again  last August. She states that she has had swelling, warmth, redness and increased pain in her right knee ever since her surgery last August. She has had difficulty bearing weight. Her right knee was aspirated in the office on 09/03/2015 with cultures growing Pseudomonas again, still sensitive to ciprofloxacin. She was admitted yesterday and underwent excision arthroplasty and spacer placement. Operative Gram stain was negative and cultures are pending.  Left knee 05/25/2004 - Left Total Knee Arthroplasty - Dr. Chaney Malling 3/10 2011 - Clsoed Reduction of Left TKA Dislocation - Dr. Lequita Halt 11/25/2009 - Revision Left Total Knee Arthroplasty - Dr. Lequita Halt  Right Knee 2006 - Right Total Knee - performed by Dr. Kristeen Miss 05/04/2009 - Patella Realignment - Dr. Madelon Lips 05/07/2009 - Closed Reduction of Right TKA Dislcoation Second Opinion by Dr. Lequita Halt - 07/15/2009 09/30/2009 - Revision Right Total Knee Arthroplasty - Dr. Lequita Halt 01/21/2010 - Office Visit - Aspiration of Right Knee (dont have cultures - sent out) 01/22/2010 - Right Knee Arthroscopic Debridement (Right Knee Sepsis) - Culture Neg, IV Vancomycin 02/02/2010 - Office Visit - Previous Final Culture positive for Pseudomonas 02/04/2010 - Formal I&D and Poly Exchange Right Knee - Oral Cipro for 6 Weeks 08/03/2011 - Right Knee Arthroscopic Debridement and Synovectomy (hypertrophic synovitis) 02/01/2012 - Right Knee Poly Revision (Right Knee Instability) 02/21/2012 - Right Total Knee Revision (Right Knee Loosening) 11/01/2013 - Office Blood Work: Sed Rate -  30, CRP -1.3 04/02/2014 - Right Femoral Revision (Failed painful Right TKA) 12/17/2014 - Right Knee Arthroscopic Debridement and Synovectomy (hypertrophic synovitis) 03/07/2015 - Admitted for Cellulitis Right Knee - IV ABX 03/10/2015 - Formal I&D Right Knee - Culture positive for PSEUDOMONAS AERUGINOSA - Treated with Oral Cipro 06/02/2015 - Repair of Right Quad Tendon Rupture (operative note stated no clinical  signs of infection) 09/03/2015 - Office Visit - Aspiration of Right Knee - Culture positive for PSEUDOMONAS AERUGINOSA. 09/14/2015 - Excision arthroplasty with spacer placement  Review of Systems: Review of Systems  Constitutional: Positive for malaise/fatigue. Negative for fever, chills, weight loss and diaphoresis.  HENT: Negative for sore throat.   Respiratory: Negative for cough, sputum production and shortness of breath.   Cardiovascular: Negative for chest pain.  Gastrointestinal: Positive for nausea. Negative for vomiting and diarrhea.  Genitourinary: Negative for dysuria and frequency.  Musculoskeletal: Positive for joint pain. Negative for myalgias.  Skin: Negative for rash.  Neurological: Positive for sensory change and weakness. Negative for focal weakness and headaches.       She describes no numbness in her right first and second toes.  Psychiatric/Behavioral: Negative for depression and substance abuse. The patient is not nervous/anxious.     Past Medical History  Diagnosis Date  . Hyperlipidemia   . Status post primary angioplasty with coronary stent     DRUG-ELUTING STENT X1  TO PROXIMAL LAD  . Chronic back pain   . Chronic neck pain   . Arthritis     HANDS, KNEES  . DDD (degenerative disc disease), cervical   . DDD (degenerative disc disease), lumbar   . Radicular pain in left arm OCCASIONAL PAIN SHOOTING DOWN LEFT ARM SECAONDARY TO  CERVICAL DEGENERATION  . Hemorrhoid   . Diverticulitis of colon   . B12 deficiency anemia   . Vitamin D deficiency   . Hypothyroidism   . Synovitis of knee HYPERTROPHIC RIGHT KNEE  . Abnormal stress test 01-22-2009    LOW RISK ADENOSINE NUCLEAR STUDY W/ PROBABLE MILD APICAL THINNING BUT NO ISCHEMIA  . Depression     states well controlled  . GERD (gastroesophageal reflux disease)   . Inguinal hernia, right     with ventral hernia  . Hemorrhoids   . Coronary artery disease 2005    STRESS TEST /, NOte Dr Kirtland Bouchard EPIC/ no  cardiologist now--STATES HEART STENT PLACED 2010  . Bronchitis     hx of   . H/O hiatal hernia   . Pneumonia     hx of walking pneumonia  . Hypertension       PCP Dr Lysbeth Galas; pt currently on no medication for treatment   . Sleep apnea     couldnt tolerate machine- states last sleep study " many years ago"  . Diabetes mellitus without complication (HCC)     pt states since having gastric banding does not have take medication was on metformin in past  . History of urinary tract infection   . Urinary incontinence     Social History  Substance Use Topics  . Smoking status: Former Smoker -- 1.00 packs/day for 30 years    Types: Cigarettes    Quit date: 07/31/2002  . Smokeless tobacco: Never Used  . Alcohol Use: No    Family History  Problem Relation Age of Onset  . COPD Mother   . Cancer Father     lung   No Known Allergies  OBJECTIVE: Blood pressure 103/45, pulse 69, temperature 98.1 F (36.7  C), temperature source Oral, resp. rate 18, height 5' 4.5" (1.638 m), weight 227 lb 6 oz (103.137 kg), SpO2 98 %.  Physical Exam  Constitutional: She is oriented to person, place, and time.  She is sitting up in bed. She is flushed.  HENT:  Mouth/Throat: No oropharyngeal exudate.  Eyes: Conjunctivae are normal.  Cardiovascular: Normal rate and regular rhythm.   No murmur heard. Pulmonary/Chest: Breath sounds normal. She has no wheezes. She has no rales.  Abdominal: Soft. There is no tenderness.  Musculoskeletal:  Her right leg is in an Ace wrap. 2 operative drains are in place.  Neurological: She is alert and oriented to person, place, and time.  Skin: No rash noted.  Psychiatric: Mood and affect normal.    Lab Results Lab Results  Component Value Date   WBC 9.0 09/15/2015   HGB 10.7* 09/15/2015   HCT 34.5* 09/15/2015   MCV 90.3 09/15/2015   PLT 262 09/15/2015    Lab Results  Component Value Date   CREATININE 0.72 09/15/2015   BUN 14 09/15/2015   NA 139 09/15/2015    K 5.2* 09/15/2015   CL 107 09/15/2015   CO2 26 09/15/2015    Lab Results  Component Value Date   ALT 14 09/11/2015   AST 20 09/11/2015   ALKPHOS 82 09/11/2015   BILITOT 0.7 09/11/2015     Microbiology: Recent Results (from the past 240 hour(s))  Surgical pcr screen     Status: None   Collection Time: 09/11/15  2:00 PM  Result Value Ref Range Status   MRSA, PCR NEGATIVE NEGATIVE Final   Staphylococcus aureus NEGATIVE NEGATIVE Final    Comment:        The Xpert SA Assay (FDA approved for NASAL specimens in patients over 83 years of age), is one component of a comprehensive surveillance program.  Test performance has been validated by [facility] for patients greater than or equal to 25 year old. It is not intended to diagnose infection nor to guide or monitor treatment.   Body fluid culture     Status: None (Preliminary result)   Collection Time: 09/14/15  1:40 PM  Result Value Ref Range Status   Specimen Description SYNOVIAL  Final   Special Requests NONE  Final   Gram Stain   Final    ABUNDANT WBC PRESENT,BOTH PMN AND MONONUCLEAR NO ORGANISMS SEEN Gram Stain Report Called to,Read Back By and Verified With: Carolynn Sayers @ 1702 BY J SCOTTON    Culture PENDING  Incomplete   Report Status PENDING  Incomplete    Cliffton Asters, MD [facility] [phone] pager   [phone] cell 09/15/2015, 4:41 PM

## 2015-09-16 LAB — BASIC METABOLIC PANEL
Anion gap: 7 (ref 5–15)
BUN: 14 mg/dL (ref 6–20)
CALCIUM: 8.7 mg/dL — AB (ref 8.9–10.3)
CO2: 25 mmol/L (ref 22–32)
CREATININE: 0.74 mg/dL (ref 0.44–1.00)
Chloride: 106 mmol/L (ref 101–111)
GFR calc Af Amer: >60 mL/min (ref >60)
GLUCOSE: 109 mg/dL — AB (ref 65–99)
POTASSIUM: 5.2 mmol/L — AB (ref 3.5–5.1)
SODIUM: 138 mmol/L (ref 135–145)

## 2015-09-16 LAB — CBC
HCT: 32.7 % — ABNORMAL LOW (ref 36.0–46.0)
Hemoglobin: 10.1 g/dL — ABNORMAL LOW (ref 12.0–15.0)
MCH: 27.9 pg (ref 26.0–34.0)
MCHC: 30.9 g/dL (ref 30.0–36.0)
MCV: 90.3 fL (ref 78.0–100.0)
PLATELETS: 254 10*3/uL (ref 150–400)
RBC: 3.62 MIL/uL — AB (ref 3.87–5.11)
RDW: 13.6 % (ref 11.5–15.5)
WBC: 6.8 10*3/uL (ref 4.0–10.5)

## 2015-09-16 NOTE — Progress Notes (Signed)
Patient ID: Andrea Norton, female   DOB: 1945-10-31, 70 y.o.   MRN: [id number]         [facility]    Date of Admission:  09/14/2015           Day 2 ciprofloxacin  Principal Problem:   Infection of prosthetic right knee joint (HCC) Active Problems:   Osteoarthritis   Hypothyroidism   Hyperlipidemia   OBESITY, MORBID   Essential hypertension   Coronary atherosclerosis   GASTROESOPHAGEAL REFLUX DISEASE   Sleep apnea   . ciprofloxacin  750 mg Oral BID  . docusate sodium  100 mg Oral BID  . escitalopram  10 mg Oral QHS  . gabapentin  300 mg Oral BID  . levothyroxine  125 mcg Oral QAC breakfast  . pantoprazole  40 mg Oral Q lunch  . rivaroxaban  10 mg Oral Q breakfast  . simvastatin  40 mg Oral Daily    SUBJECTIVE: She is very tired after physical therapy today. She just took some pain medication for her right knee pain. She's not having any more nausea.  Review of Systems: Review of Systems  Constitutional: Positive for malaise/fatigue. Negative for fever, chills, weight loss and diaphoresis.  HENT: Negative for sore throat.   Respiratory: Negative for cough, sputum production and shortness of breath.   Cardiovascular: Negative for chest pain.  Gastrointestinal: Negative for nausea, vomiting and diarrhea.  Genitourinary: Negative for dysuria.  Musculoskeletal: Positive for back pain and joint pain. Negative for myalgias.  Skin: Negative for rash.  Neurological: Positive for speech change. Negative for headaches.    Past Medical History  Diagnosis Date  . Hyperlipidemia   . Status post primary angioplasty with coronary stent     DRUG-ELUTING STENT X1  TO PROXIMAL LAD  . Chronic back pain   . Chronic neck pain   . Arthritis     HANDS, KNEES  . DDD (degenerative disc disease), cervical   . DDD (degenerative disc disease), lumbar   . Radicular pain in left arm OCCASIONAL PAIN SHOOTING DOWN LEFT ARM SECAONDARY TO  CERVICAL DEGENERATION  .  Hemorrhoid   . Diverticulitis of colon   . B12 deficiency anemia   . Vitamin D deficiency   . Hypothyroidism   . Synovitis of knee HYPERTROPHIC RIGHT KNEE  . Abnormal stress test 01-22-2009    LOW RISK ADENOSINE NUCLEAR STUDY W/ PROBABLE MILD APICAL THINNING BUT NO ISCHEMIA  . Depression     states well controlled  . GERD (gastroesophageal reflux disease)   . Inguinal hernia, right     with ventral hernia  . Hemorrhoids   . Coronary artery disease 2005    STRESS TEST /, NOte Dr Kirtland Bouchard EPIC/ no cardiologist now--STATES HEART STENT PLACED 2010  . Bronchitis     hx of   . H/O hiatal hernia   . Pneumonia     hx of walking pneumonia  . Hypertension       PCP Dr Lysbeth Galas; pt currently on no medication for treatment   . Sleep apnea     couldnt tolerate machine- states last sleep study " many years ago"  . Diabetes mellitus without complication (HCC)     pt states since having gastric banding does not have take medication was on metformin in past  . History of urinary tract infection   . Urinary incontinence     Social History  Substance Use Topics  . Smoking status: Former Smoker -- 1.00  packs/day for 30 years    Types: Cigarettes    Quit date: 07/31/2002  . Smokeless tobacco: Never Used  . Alcohol Use: No    Family History  Problem Relation Age of Onset  . COPD Mother   . Cancer Father     lung   No Known Allergies  OBJECTIVE: Filed Vitals:   09/15/15 1747 09/15/15 2241 09/16/15 0451 09/16/15 1300  BP: 126/69 103/59 137/71 103/54  Pulse: 76 83 63 79  Temp: 98.9 F (37.2 C) 97.9 F (36.6 C) 97.5 F (36.4 C) 97.9 F (36.6 C)  TempSrc: Oral Oral Oral Oral  Resp: Height:      Weight:      SpO2: 98% 96% 96% 93%   Body mass index is 38.44 kg/(m^2).  Physical Exam  Constitutional: She is oriented to person, place, and time. No distress.  She is resting quietly in bed.  HENT:  Mouth/Throat: No oropharyngeal exudate.  Cardiovascular: Normal rate  and regular rhythm.   No murmur heard. Pulmonary/Chest: Breath sounds normal.  Abdominal: Soft. There is no tenderness.  Musculoskeletal:  Her right knee drains remain in place. She has soft brace on.  Neurological: She is alert and oriented to person, place, and time.  Skin: No rash noted.  Psychiatric: Mood and affect normal.    Lab Results Lab Results  Component Value Date   WBC 6.8 09/16/2015   HGB 10.1* 09/16/2015   HCT 32.7* 09/16/2015   MCV 90.3 09/16/2015   PLT 254 09/16/2015    Lab Results  Component Value Date   CREATININE 0.74 09/16/2015   BUN 14 09/16/2015   NA 138 09/16/2015   K 5.2* 09/16/2015   CL 106 09/16/2015   CO2 25 09/16/2015    Lab Results  Component Value Date   ALT 14 09/11/2015   AST 20 09/11/2015   ALKPHOS 82 09/11/2015   BILITOT 0.7 09/11/2015     Microbiology: Recent Results (from the past 240 hour(s))  Surgical pcr screen     Status: None   Collection Time: 09/11/15  2:00 PM  Result Value Ref Range Status   MRSA, PCR NEGATIVE NEGATIVE Final   Staphylococcus aureus NEGATIVE NEGATIVE Final    Comment:        The Xpert SA Assay (FDA approved for NASAL specimens in patients over 38 years of age), is one component of a comprehensive surveillance program.  Test performance has been validated by [facility] for patients greater than or equal to 37 year old. It is not intended to diagnose infection nor to guide or monitor treatment.   Body fluid culture     Status: None (Preliminary result)   Collection Time: 09/14/15  1:40 PM  Result Value Ref Range Status   Specimen Description SYNOVIAL  Final   Special Requests NONE  Final   Gram Stain   Final    ABUNDANT WBC PRESENT,BOTH PMN AND MONONUCLEAR NO ORGANISMS SEEN Gram Stain Report Called to,Read Back By and Verified With: LISA HARTLEY,RN [id number] @ 1702 BY J SCOTTON    Culture   Final    RARE PSEUDOMONAS AERUGINOSA CRITICAL RESULT CALLED TO, READ BACK BY AND VERIFIED WITH: A FARGO  09/16/15 @ 1246 M VESTAL Performed at [facility]    Report Status PENDING  Incomplete     ASSESSMENT: The operative cultures from her right knee are also growing Pseudomonas. Arthrocentesis cultures from her right knee obtained 2 weeks ago Pseudomonas sensitive  to ciprofloxacin. I will plan on continuing ciprofloxacin for at least 6 weeks. I'm hopeful that now that she has had excision arthroplasty and spacer placement 6 weeks of antibiotic therapy will be sufficient to cure the infection.  PLAN: 1. Continue ciprofloxacin 2. I will arrange follow-up in my clinic in 4-5 weeks.  Cliffton Asters, MD [facility] [phone] pager   [phone] cell 09/16/2015, 4:17 PM

## 2015-09-16 NOTE — Progress Notes (Signed)
Physical Therapy Treatment Patient Details Name: Andrea Norton MRN: [id number] DOB: 1946-04-16 Today's Date: 09/16/2015    History of Present Illness Pt s/p R TKR resection with antibiotic spacer    PT Comments    Pt continues motivated and cooperative but very ltd by pain with mobility.  Follow Up Recommendations  No PT follow up     Equipment Recommendations  Wheelchair (measurements PT)    Recommendations for Other Services       Precautions / Restrictions Precautions Precautions: Knee;Fall Required Braces or Orthoses: Knee Immobilizer - Right Knee Immobilizer - Right: On at all times Restrictions Weight Bearing Restrictions: No Other Position/Activity Restrictions: WBAT    Mobility  Bed Mobility Overal bed mobility: Needs Assistance Bed Mobility: Sit to Supine       Sit to supine: Min assist   General bed mobility comments: cues for sequence and use of L LE to self assist.  Physical assist required to manage R LE  Transfers Overall transfer level: Needs assistance Equipment used: Rolling walker (2 wheeled) Transfers: Sit to/from Stand Sit to Stand: Min assist         General transfer comment: cues for LE management and use of UEs to self assist  Ambulation/Gait Ambulation/Gait assistance: Min assist Ambulation Distance (Feet): 17 Feet Assistive device: Rolling walker (2 wheeled) Gait Pattern/deviations: Step-to pattern;Decreased step length - right;Decreased step length - left;Shuffle;Trunk flexed Gait velocity: decr   General Gait Details: Cues for sequence, posture and position from RW.  Pt tolerating min WB on R LE   Stairs            Wheelchair Mobility    Modified Rankin (Stroke Patients Only)       Balance                                    Cognition Arousal/Alertness: Awake/alert Behavior During Therapy: WFL for tasks assessed/performed Overall Cognitive Status: Within Functional Limits for tasks assessed                       Exercises Total Joint Exercises Ankle Circles/Pumps: AROM;Both;15 reps;Supine    General Comments        Pertinent Vitals/Pain Pain Assessment: 0-10 Pain Score: 8  Pain Location: R knee with activity Pain Descriptors / Indicators: Aching;Sore Pain Intervention(s): Limited activity within patient's tolerance;Monitored during session;Premedicated before session;Ice applied    Home Living                      Prior Function            PT Goals (current goals can now be found in the care plan section) Acute Rehab PT Goals Patient Stated Goal: Regain IND PT Goal Formulation: With patient Time For Goal Achievement: 09/19/15 Potential to Achieve Goals: Good Progress towards PT goals: Progressing toward goals    Frequency  7X/week    PT Plan Current plan remains appropriate    Co-evaluation             End of Session Equipment Utilized During Treatment: Gait belt;Right knee immobilizer Activity Tolerance: Patient limited by pain Patient left: in bed;with call bell/phone within reach     Time: 1420-1445 PT Time Calculation (min) (ACUTE ONLY): 25 min  Charges:  $Gait Training: 8-22 mins  G Codes:      Marquise Wicke 10-10-15, 5:21 PM

## 2015-09-16 NOTE — Progress Notes (Signed)
Physical Therapy Treatment Patient Details Name: Andrea Norton MRN: [id number] DOB: 09/26/1945 Today's Date: 09/16/2015    History of Present Illness Pt s/p R TKR resection with antibiotic spacer    PT Comments    Pt continues motivated and cooperative but ltd by pain with activity.  Follow Up Recommendations  No PT follow up     Equipment Recommendations  None recommended by PT    Recommendations for Other Services       Precautions / Restrictions Precautions Precautions: Knee;Fall Required Braces or Orthoses: Knee Immobilizer - Right Knee Immobilizer - Right: On at all times Restrictions Weight Bearing Restrictions: No Other Position/Activity Restrictions: WBAT    Mobility  Bed Mobility Overal bed mobility: Needs Assistance Bed Mobility: Supine to Sit       Sit to supine: Min assist   General bed mobility comments: cues for sequence and use of L LE to self assist.  Physical assist required to manage R LE  Transfers Overall transfer level: Needs assistance Equipment used: Rolling walker (2 wheeled) Transfers: Sit to/from Stand Sit to Stand: Min assist         General transfer comment: cues for LE management and use of UEs to self assist  Ambulation/Gait Ambulation/Gait assistance: Min assist Ambulation Distance (Feet): 9 Feet Assistive device: Rolling walker (2 wheeled) Gait Pattern/deviations: Step-to pattern;Decreased step length - right;Decreased step length - left;Shuffle;Trunk flexed Gait velocity: decr   General Gait Details: Cues for sequence, posture and position from RW.  Pt tolerating min WB on R LE   Stairs            Wheelchair Mobility    Modified Rankin (Stroke Patients Only)       Balance                                    Cognition Arousal/Alertness: Awake/alert Behavior During Therapy: WFL for tasks assessed/performed Overall Cognitive Status: Within Functional Limits for tasks assessed                       Exercises      General Comments        Pertinent Vitals/Pain Pain Assessment: 0-10 Pain Score: 8  Pain Location: R knee with activity Pain Descriptors / Indicators: Aching;Sore Pain Intervention(s): Limited activity within patient's tolerance;Monitored during session;Premedicated before session;Ice applied    Home Living                      Prior Function            PT Goals (current goals can now be found in the care plan section) Acute Rehab PT Goals Patient Stated Goal: Regain IND PT Goal Formulation: With patient Time For Goal Achievement: 09/19/15 Potential to Achieve Goals: Good Progress towards PT goals: Progressing toward goals    Frequency  7X/week    PT Plan Current plan remains appropriate    Co-evaluation             End of Session Equipment Utilized During Treatment: Gait belt;Right knee immobilizer Activity Tolerance: Patient limited by pain Patient left: in chair;with call bell/phone within reach;with family/visitor present     Time: 1050-1106 PT Time Calculation (min) (ACUTE ONLY): 16 min  Charges:  $Gait Training: 8-22 mins  G Codes:      Arnette Driggs 27-Sep-2015, 12:27 PM

## 2015-09-17 LAB — CBC
HCT: 32.6 % — ABNORMAL LOW (ref 36.0–46.0)
Hemoglobin: 10.2 g/dL — ABNORMAL LOW (ref 12.0–15.0)
MCH: 28.4 pg (ref 26.0–34.0)
MCHC: 31.3 g/dL (ref 30.0–36.0)
MCV: 90.8 fL (ref 78.0–100.0)
PLATELETS: 251 10*3/uL (ref 150–400)
RBC: 3.59 MIL/uL — ABNORMAL LOW (ref 3.87–5.11)
RDW: 14 % (ref 11.5–15.5)
WBC: 6.6 10*3/uL (ref 4.0–10.5)

## 2015-09-17 LAB — BODY FLUID CULTURE

## 2015-09-17 MED ORDER — METHOCARBAMOL 500 MG PO TABS: 500.0000 mg | ORAL_TABLET | Freq: Four times a day (QID) | ORAL | Status: DC | PRN

## 2015-09-17 MED ORDER — RIVAROXABAN 10 MG PO TABS: 10.0000 mg | ORAL_TABLET | Freq: Every day | ORAL | Status: DC

## 2015-09-17 MED ORDER — CIPROFLOXACIN HCL 750 MG PO TABS: 750.0000 mg | ORAL_TABLET | Freq: Two times a day (BID) | ORAL | Status: DC

## 2015-09-17 MED ORDER — HYDROMORPHONE HCL 2 MG PO TABS: 2.0000 mg | ORAL_TABLET | ORAL | Status: DC | PRN

## 2015-09-17 NOTE — Discharge Summary (Signed)
Physician Discharge Summary   Patient ID: Andrea Norton MRN: [id number] DOB/AGE: 68-Apr-1947 68 y.o.  Admit date: 09/14/2015 Discharge date: 09-17-2015  Primary Diagnosis:  Infected right total knee arthroplasty.  Admission Diagnoses:  Past Medical History  Diagnosis Date  . Hyperlipidemia   . Status post primary angioplasty with coronary stent     DRUG-ELUTING STENT X1  TO PROXIMAL LAD  . Chronic back pain   . Chronic neck pain   . Arthritis     HANDS, KNEES  . DDD (degenerative disc disease), cervical   . DDD (degenerative disc disease), lumbar   . Radicular pain in left arm OCCASIONAL PAIN SHOOTING DOWN LEFT ARM SECAONDARY TO  CERVICAL DEGENERATION  . Hemorrhoid   . Diverticulitis of colon   . B12 deficiency anemia   . Vitamin D deficiency   . Hypothyroidism   . Synovitis of knee HYPERTROPHIC RIGHT KNEE  . Abnormal stress test 01-22-2009    LOW RISK ADENOSINE NUCLEAR STUDY W/ PROBABLE MILD APICAL THINNING BUT NO ISCHEMIA  . Depression     states well controlled  . GERD (gastroesophageal reflux disease)   . Inguinal hernia, right     with ventral hernia  . Hemorrhoids   . Coronary artery disease 2005    STRESS TEST /, NOte Dr Jenkins [place] EPIC/ no cardiologist now--STATES [place] 2010  . Bronchitis     hx of   . H/O hiatal hernia   . Pneumonia     hx of walking pneumonia  . Hypertension       PCP Dr Edrick Oh; pt currently on no medication for treatment   . Sleep apnea     couldnt tolerate machine- states last sleep study " many years ago"  . Diabetes mellitus without complication ([place])     pt states since having gastric banding does not have take medication was on metformin in past  . History of urinary tract infection   . Urinary incontinence    Discharge Diagnoses:   Principal Problem:   Infection of prosthetic right knee joint (HCC) Active Problems:   Hypothyroidism   Hyperlipidemia   OBESITY, MORBID   Essential hypertension   Coronary  atherosclerosis   GASTROESOPHAGEAL REFLUX DISEASE   Osteoarthritis   Sleep apnea  Estimated body mass index is 38.44 kg/(m^2) as calculated from the following:   Height as of this encounter: 5' 4.5" (1.638 m).   Weight as of this encounter: 103.137 kg (227 lb 6 oz).  Procedure:  Procedure(s) (LRB): RIGHT TOTAL KNEE RESSECTION ARTHROPLASTY WITH ANTIBIOTIC SPACERS (Right)   Consults: ID  HPI: Andrea Norton is a 70 year old female, very long complex history in regard to this right knee. She has had multiple procedures. She has developed painful swelling in the knee and unfortunately has infection. She had an aspirin in the office last week showing Pseudomonas. The second time, she has had Pseudomonas. At this time, we discussed the more definitive treatment with resection arthroplasty, antibiotic spacer placement and eventual two-stage revision. She presents now for stage I, which is the resection arthroplasty and antibiotic spacer.  Laboratory Data: Admission on 09/14/2015  Component Date Value Ref Range Status  . Glucose-Capillary 09/14/2015 82  65 - 99 mg/dL Final  . Comment 1 09/14/2015 Document in Chart   Final  . Specimen Description 09/14/2015 SYNOVIAL   Final  . Special Requests 09/14/2015 NONE   Final  . Gram Stain 09/14/2015    Final  Value:ABUNDANT WBC PRESENT,BOTH PMN AND MONONUCLEAR NO ORGANISMS SEEN Gram Stain Report Called to,Read Back By and Verified With: [place] [id number] @ [place]   . Culture 09/14/2015    Final                   Value:RARE PSEUDOMONAS AERUGINOSA CRITICAL RESULT CALLED TO, READ BACK BY AND VERIFIED WITH: A FARGO 09/16/15 @ [place] Performed at [facility]   . Report Status 09/14/2015 PENDING   Incomplete  . Glucose-Capillary 09/14/2015 146* 65 - 99 mg/dL Final  . Comment 1 09/14/2015 Notify RN   Final  . Comment 2 09/14/2015 Document in Chart   Final  . WBC 09/15/2015 9.0  4.0 - 10.5 K/uL  Final  . RBC 09/15/2015 3.82* 3.87 - 5.11 MIL/uL Final  . Hemoglobin 09/15/2015 10.7* 12.0 - 15.0 g/dL Final  . HCT 09/15/2015 34.5* 36.0 - 46.0 % Final  . MCV 09/15/2015 90.3  78.0 - 100.0 fL Final  . MCH 09/15/2015 28.0  26.0 - 34.0 pg Final  . MCHC 09/15/2015 31.0  30.0 - 36.0 g/dL Final  . RDW 09/15/2015 13.6  11.5 - 15.5 % Final  . Platelets 09/15/2015 262  150 - 400 K/uL Final  . Sodium 09/15/2015 139  135 - 145 mmol/L Final  . Potassium 09/15/2015 5.2* 3.5 - 5.1 mmol/L Final  . Chloride 09/15/2015 107  101 - 111 mmol/L Final  . CO2 09/15/2015 26  22 - 32 mmol/L Final  . Glucose, Bld 09/15/2015 145* 65 - 99 mg/dL Final  . BUN 09/15/2015 14  6 - 20 mg/dL Final  . Creatinine, Ser 09/15/2015 0.72  0.44 - 1.00 mg/dL Final  . Calcium 09/15/2015 8.9  8.9 - 10.3 mg/dL Final  . GFR calc non Af Amer 09/15/2015 >60  >60 mL/min Final  . GFR calc Af Amer 09/15/2015 >60  >60 mL/min Final   Comment: (NOTE) The eGFR has been calculated using the CKD EPI equation. This calculation has not been validated in all clinical situations. eGFR's persistently <60 mL/min signify possible Chronic Kidney Disease.   . Anion gap 09/15/2015 6  5 - 15 Final  . WBC 09/16/2015 6.8  4.0 - 10.5 K/uL Final  . RBC 09/16/2015 3.62* 3.87 - 5.11 MIL/uL Final  . Hemoglobin 09/16/2015 10.1* 12.0 - 15.0 g/dL Final  . HCT 09/16/2015 32.7* 36.0 - 46.0 % Final  . MCV 09/16/2015 90.3  78.0 - 100.0 fL Final  . MCH 09/16/2015 27.9  26.0 - 34.0 pg Final  . MCHC 09/16/2015 30.9  30.0 - 36.0 g/dL Final  . RDW 09/16/2015 13.6  11.5 - 15.5 % Final  . Platelets 09/16/2015 254  150 - 400 K/uL Final  . Sodium 09/16/2015 138  135 - 145 mmol/L Final  . Potassium 09/16/2015 5.2* 3.5 - 5.1 mmol/L Final  . Chloride 09/16/2015 106  101 - 111 mmol/L Final  . CO2 09/16/2015 25  22 - 32 mmol/L Final  . Glucose, Bld 09/16/2015 109* 65 - 99 mg/dL Final  . BUN 09/16/2015 14  6 - 20 mg/dL Final  . Creatinine, Ser 09/16/2015 0.74  0.44 -  1.00 mg/dL Final  . Calcium 09/16/2015 8.7* 8.9 - 10.3 mg/dL Final  . GFR calc non Af Amer 09/16/2015 >60  >60 mL/min Final  . GFR calc Af Amer 09/16/2015 >60  >60 mL/min Final   Comment: (NOTE) The eGFR has been calculated using the CKD EPI equation. This calculation  has not been validated in all clinical situations. eGFR's persistently <60 mL/min signify possible Chronic Kidney Disease.   . Anion gap 09/16/2015 7  5 - 15 Final  . WBC 09/17/2015 6.6  4.0 - 10.5 K/uL Final  . RBC 09/17/2015 3.59* 3.87 - 5.11 MIL/uL Final  . Hemoglobin 09/17/2015 10.2* 12.0 - 15.0 g/dL Final  . HCT 09/17/2015 32.6* 36.0 - 46.0 % Final  . MCV 09/17/2015 90.8  78.0 - 100.0 fL Final  . MCH 09/17/2015 28.4  26.0 - 34.0 pg Final  . MCHC 09/17/2015 31.3  30.0 - 36.0 g/dL Final  . RDW 09/17/2015 14.0  11.5 - 15.5 % Final  . Platelets 09/17/2015 251  150 - 400 K/uL Final  Hospital Outpatient Visit on 09/11/2015  Component Date Value Ref Range Status  . MRSA, PCR 09/11/2015 NEGATIVE  NEGATIVE Final  . Staphylococcus aureus 09/11/2015 NEGATIVE  NEGATIVE Final   Comment:        The Xpert SA Assay (FDA approved for NASAL specimens in patients over 6 years of age), is one component of a comprehensive surveillance program.  Test performance has been validated by [facility] for patients greater than or equal to 59 year old. It is not intended to diagnose infection nor to guide or monitor treatment.   Marland Kitchen aPTT 09/11/2015 34  24 - 37 seconds Final  . WBC 09/11/2015 5.8  4.0 - 10.5 K/uL Final  . RBC 09/11/2015 4.49  3.87 - 5.11 MIL/uL Final  . Hemoglobin 09/11/2015 12.5  12.0 - 15.0 g/dL Final  . HCT 09/11/2015 40.6  36.0 - 46.0 % Final  . MCV 09/11/2015 90.4  78.0 - 100.0 fL Final  . MCH 09/11/2015 27.8  26.0 - 34.0 pg Final  . MCHC 09/11/2015 30.8  30.0 - 36.0 g/dL Final  . RDW 09/11/2015 13.9  11.5 - 15.5 % Final  . Platelets 09/11/2015 314  150 - 400 K/uL Final  . Sodium 09/11/2015 144  135 - 145  mmol/L Final  . Potassium 09/11/2015 4.6  3.5 - 5.1 mmol/L Final  . Chloride 09/11/2015 106  101 - 111 mmol/L Final  . CO2 09/11/2015 29  22 - 32 mmol/L Final  . Glucose, Bld 09/11/2015 119* 65 - 99 mg/dL Final  . BUN 09/11/2015 14  6 - 20 mg/dL Final  . Creatinine, Ser 09/11/2015 0.70  0.44 - 1.00 mg/dL Final  . Calcium 09/11/2015 9.2  8.9 - 10.3 mg/dL Final  . Total Protein 09/11/2015 7.7  6.5 - 8.1 g/dL Final  . Albumin 09/11/2015 3.6  3.5 - 5.0 g/dL Final  . AST 09/11/2015 20  15 - 41 U/L Final  . ALT 09/11/2015 14  14 - 54 U/L Final  . Alkaline Phosphatase 09/11/2015 82  38 - 126 U/L Final  . Total Bilirubin 09/11/2015 0.7  0.3 - 1.2 mg/dL Final  . GFR calc non Af Amer 09/11/2015 >60  >60 mL/min Final  . GFR calc Af Amer 09/11/2015 >60  >60 mL/min Final   Comment: (NOTE) The eGFR has been calculated using the CKD EPI equation. This calculation has not been validated in all clinical situations. eGFR's persistently <60 mL/min signify possible Chronic Kidney Disease.   . Anion gap 09/11/2015 9  5 - 15 Final  . Prothrombin Time 09/11/2015 13.9  11.6 - 15.2 seconds Final  . INR 09/11/2015 1.05  0.00 - 1.49 Final  . ABO/RH(D) 09/11/2015 O POS   Final  . Antibody Screen 09/11/2015 NEG  Final  . Sample Expiration 09/11/2015 09/17/2015   Final  . Extend sample reason 09/11/2015 NO TRANSFUSIONS OR PREGNANCY IN THE PAST 3 MONTHS   Final  . Color, Urine 09/11/2015 YELLOW  YELLOW Final  . APPearance 09/11/2015 CLEAR  CLEAR Final  . Specific Gravity, Urine 09/11/2015 1.023  1.005 - 1.030 Final  . pH 09/11/2015 6.0  5.0 - 8.0 Final  . Glucose, UA 09/11/2015 NEGATIVE  NEGATIVE mg/dL Final  . Hgb urine dipstick 09/11/2015 NEGATIVE  NEGATIVE Final  . Bilirubin Urine 09/11/2015 NEGATIVE  NEGATIVE Final  . Ketones, ur 09/11/2015 NEGATIVE  NEGATIVE mg/dL Final  . Protein, ur 09/11/2015 NEGATIVE  NEGATIVE mg/dL Final  . Nitrite 09/11/2015 NEGATIVE  NEGATIVE Final  . Leukocytes, UA 09/11/2015  NEGATIVE  NEGATIVE Final   MICROSCOPIC NOT DONE ON URINES WITH NEGATIVE PROTEIN, BLOOD, LEUKOCYTES, NITRITE, OR GLUCOSE <1000 mg/dL.  . Hgb A1c MFr Bld 09/11/2015 5.3  4.8 - 5.6 % Final   Comment: (NOTE)         Pre-diabetes: 5.7 - 6.4         Diabetes: >6.4         Glycemic control for adults with diabetes: <7.0   . Mean Plasma Glucose 09/11/2015 105   Final   Comment: (NOTE) Performed At: [facility] [place], [place] [id number] Lindon Romp MD ZD:[id number]      X-Rays:No results found.  EKG: Orders placed or performed during the hospital encounter of 05/28/15  . EKG 12-Lead  . EKG 12-Lead     Hospital Course: TANIYAH BALLOW is a 70 y.o. who was admitted to [facility]. They were brought to the operating room on 09/14/2015 and underwent Procedure(s): RIGHT TOTAL KNEE RESSECTION ARTHROPLASTY WITH ANTIBIOTIC SPACERS.  Patient tolerated the procedure well and was later transferred to the recovery room and then to the orthopaedic floor for postoperative care.  They were given PO and IV analgesics for pain control following their surgery.  They were given postoperative antibiotics of  Anti-infectives    Start     Dose/Rate Route Frequency Ordered Stop   09/17/15 0000  ciprofloxacin (CIPRO) 750 MG tablet     750 mg Oral 2 times daily 09/17/15 0910     09/15/15 2000  ciprofloxacin (CIPRO) tablet 750 mg     750 mg Oral 2 times daily 09/15/15 1640     09/14/15 2200  ciprofloxacin (CIPRO) IVPB 400 mg  Status:  Discontinued     400 mg 200 mL/hr over 60 Minutes Intravenous Every 12 hours 09/14/15 1948 09/15/15 1640     and started on DVT prophylaxis in the form of Xarelto.   PT and OT were ordered for total joint protocol.  Discharge planning consulted to help with postop disposition and equipment needs.  Patient had a decent night on the evening of surgery. Seen on the morning of day one. Patient reported pain as mild and moderate.Patient seen in rounds  with Dr. Wynelle Link. Sitting up in bed. In good spirits. Briefly discussed the surgical findings and the procedure.  Requested I.D. Consult for recommendations. Held off on PICC at that time until consult. Slight possibility may only need oral antibiotics. They started to get up OOB with therapy on day one.  I.D Consult Recommendations: 1. Agree with ciprofloxacin. I have changed it to ciprofloxacin 750 mg by mouth twice daily and instructed her not to take this with her usual calcium supplements, iron or other heavy metals. Assessment:  She has persistent Pseudomonas infection of her right prosthetic knee that was removed yesterday. Her recent Pseudomonas isolate remains susceptible to ciprofloxacin. I changed ciprofloxacin over to the oral form. She has taken calcium intermittently in the past. I instructed her not to take calcium, iron, magnesium or other heavy metals while she is on ciprofloxacin as these can interfere with absorption. She will need at least 6 weeks of therapy.  Hemovac drain was pulled without difficulty on POD 2.  Continued to work with therapy into day two.   ASSESSMENT: The operative cultures from her right knee are also growing Pseudomonas. Arthrocentesis cultures from her right knee obtained 2 weeks ago Pseudomonas sensitive to ciprofloxacin. I will plan on continuing ciprofloxacin for at least 6 weeks. I'm hopeful that now that she has had excision arthroplasty and spacer placement 6 weeks of antibiotic therapy will be sufficient to cure the infection. PLAN: 2. Continue ciprofloxacin 3. I will arrange follow-up in my clinic in 4-5 weeks. Michel Bickers, MD [place] [facility] On POD 3, the dressing was changed on day two and the incision was healing well and only scant drainage for the previous HV site.  By day three, the patient was getting up with therapy.  Incision was healing well.  Patient was seen in rounds by Dr. Wynelle Link  and was ready to go home. Recommended to continue the Cipro 750 mg BID.  Discharge home with home health Diet - Cardiac diet and Diabetic diet Follow up - in 2 weeks Activity - WBAT, Knee Immobilizer at all time, No bending or motion to the knee. Disposition - Home Condition Upon Discharge - Stable D/C Meds - See DC Summary DVT Prophylaxis - Xarelto      Discharge Instructions    Call MD / Call 911    Complete by:  As directed   If you experience chest pain or shortness of breath, CALL 911 and be transported to the hospital emergency room.  If you develope a fever above 101 F, pus (white drainage) or increased drainage or redness at the wound, or calf pain, call your surgeon's office.     Change dressing    Complete by:  As directed   Change dressing daily with sterile 4 x 4 inch gauze dressing and apply TED hose. Do not submerge the incision under water.     Constipation Prevention    Complete by:  As directed   Drink plenty of fluids.  Prune juice may be helpful.  You may use a stool softener, such as Colace (over the counter) 100 mg twice a day.  Use MiraLax (over the counter) for constipation as needed.     Diet - low sodium heart healthy    Complete by:  As directed      Discharge instructions    Complete by:  As directed   Pick up stool softner and laxative for home use following surgery while on pain medications. Do not submerge incision under water. Please use good hand washing techniques while changing dressing each day. May shower starting three days after surgery. Please use a clean towel to pat the incision dry following showers. Continue to use ice for pain and swelling after surgery. Do not use any lotions or creams on the incision until instructed by your surgeon.  Take Xarelto for two and a half more weeks, then discontinue Xarelto. Once the patient has completed the Xarelto, they may resume the 81 mg Aspirin.  Postoperative Constipation Protocol  Constipation  - defined medically as fewer than three stools per week and severe constipation as less than one stool per week.  One of the most common issues patients have following surgery is constipation.  Even if you have a regular bowel pattern at home, your normal regimen is likely to be disrupted due to multiple reasons following surgery.  Combination of anesthesia, postoperative narcotics, change in appetite and fluid intake all can affect your bowels.  In order to avoid complications following surgery, here are some recommendations in order to help you during your recovery period.  Colace (docusate) - Pick up an over-the-counter form of Colace or another stool softener and take twice a day as long as you are requiring postoperative pain medications.  Take with a full glass of water daily.  If you experience loose stools or diarrhea, hold the colace until you stool forms back up.  If your symptoms do not get better within 1 week or if they get worse, check with your doctor.  Dulcolax (bisacodyl) - Pick up over-the-counter and take as directed by the product packaging as needed to assist with the movement of your bowels.  Take with a full glass of water.  Use this product as needed if not relieved by Colace only.   MiraLax (polyethylene glycol) - Pick up over-the-counter to have on hand.  MiraLax is a solution that will increase the amount of water in your bowels to assist with bowel movements.  Take as directed and can mix with a glass of water, juice, soda, coffee, or tea.  Take if you go more than two days without a movement. Do not use MiraLax more than once per day. Call your doctor if you are still constipated or irregular after using this medication for 7 days in a row.  If you continue to have problems with postoperative constipation, please contact the office for further assistance and recommendations.  If you experience "the worst abdominal pain ever" or develop nausea or vomiting, please contact the  office immediatly for further recommendations for treatment.  Knee Immobilizer At All Times No bending or motion to the right knee.     Do not sit on low chairs, stoools or toilet seats, as it may be difficult to get up from low surfaces    Complete by:  As directed      Driving restrictions    Complete by:  As directed   No driving until released by the physician.     Increase activity slowly as tolerated    Complete by:  As directed      Lifting restrictions    Complete by:  As directed   No lifting until released by the physician.     Patient may shower    Complete by:  As directed   You may shower without a dressing once there is no drainage.  Do not wash over the wound.  If drainage remains, do not shower until drainage stops.     TED hose    Complete by:  As directed   Use stockings (TED hose) for 3 weeks on both leg(s).  You may remove them at night for sleeping.     Weight bearing as tolerated    Complete by:  As directed   Laterality:  right  Extremity:  Lower            Medication List    STOP taking these medications  aspirin 325 MG tablet     aspirin EC 81 MG tablet     calcium citrate-vitamin D 500-400 MG-UNIT chewable tablet     cyanocobalamin 1000 MCG/ML injection  Commonly known as:  (VITAMIN B-12)     FLINSTONES GUMMIES OMEGA-3 DHA PO     Vitamin D (Ergocalciferol) 50000 units Caps capsule  Commonly known as:  DRISDOL      TAKE these medications        ciprofloxacin 750 MG tablet  Commonly known as:  CIPRO  Take 1 tablet (750 mg total) by mouth 2 (two) times daily. Take twice a day for 6 weeks.     escitalopram 10 MG tablet  Commonly known as:  LEXAPRO  Take 10 mg by mouth at bedtime.     gabapentin 100 MG capsule  Commonly known as:  NEURONTIN  Take 300 mg by mouth 2 (two) times daily.     HYDROmorphone 2 MG tablet  Commonly known as:  DILAUDID  Take 1-3 tablets (2-6 mg total) by mouth every 4 (four) hours as needed for moderate  pain or severe pain.     levothyroxine 125 MCG tablet  Commonly known as:  SYNTHROID, LEVOTHROID  Take 125 mcg by mouth daily before breakfast. Brand Name Only     methocarbamol 500 MG tablet  Commonly known as:  ROBAXIN  Take 1 tablet (500 mg total) by mouth every 6 (six) hours as needed for muscle spasms.     pantoprazole 40 MG tablet  Commonly known as:  PROTONIX  Take 40 mg by mouth daily with lunch.     PRESCRIPTION MEDICATION  Apply 1 application topically 2 (two) times daily as needed (Itching). 3 in one cream. Ketoconazole Cream 2%, SSD cream 1%, and Triamcinolone Cream 0.1%. Compounded at [facility] in [place]     rivaroxaban 10 MG Tabs tablet  Commonly known as:  XARELTO  Take 1 tablet (10 mg total) by mouth daily with breakfast. Take Xarelto for two and a half more weeks, then discontinue Xarelto. Once the patient has completed the Xarelto, they may resume the 81 mg Aspirin.     simvastatin 40 MG tablet  Commonly known as:  ZOCOR  Take 40 mg by mouth daily.       Follow-up Information    Follow up with Gearlean Alf, MD On 09/29/2015.   Specialty:  Orthopedic Surgery   Why:  Call office at [phone] to setup appiontment on Tuesday 09/29/2015 with Dr. Wynelle Link.   Contact information:   [address] [place] [address] [phone]       Follow up with [facility].   Why:  wheelchair   Contact information:   [address] [place] [address] [phone]       Follow up with Michel Bickers, MD.   Specialty:  Infectious Diseases   Why:  Office will arrange follow up for patient to be seen in about 4-5 weeks in clinic.   Contact information:   [address] [place] [address] [phone]       Signed: Arlee Muslim, PA-C Orthopaedic Surgery 09/17/2015, 9:22 AM

## 2015-09-17 NOTE — Progress Notes (Signed)
Physical Therapy Treatment Patient Details Name: Andrea Norton MRN: [id number] DOB: Jun 07, 1946 Today's Date: 09/17/2015    History of Present Illness Pt s/p R TKR resection with antibiotic spacer    PT Comments    Pt tolerating gentle AAROM SLR, abd/add, QS and AP with KI in place but ltd with ambulation 2* c/o increased R knee pain.  Pt hopes to dc home this date and states her husband will assist her into home with wc and they feel comfortable without HHPT having been through multiple surgeries before.  Follow Up Recommendations  No PT follow up     Equipment Recommendations  Wheelchair (measurements PT) (elevating leg rests please)    Recommendations for Other Services       Precautions / Restrictions Precautions Precautions: Knee;Fall Required Braces or Orthoses: Knee Immobilizer - Right Knee Immobilizer - Right: On at all times Restrictions Weight Bearing Restrictions: No Other Position/Activity Restrictions: WBAT    Mobility  Bed Mobility Overal bed mobility: Needs Assistance Bed Mobility: Supine to Sit     Supine to sit: Min assist     General bed mobility comments: cues for sequence and use of L LE to self assist.  Physical assist required to manage R LE  Transfers Overall transfer level: Needs assistance Equipment used: Rolling walker (2 wheeled) Transfers: Sit to/from Stand Sit to Stand: Min guard         General transfer comment: cues for LE management and use of UEs to self assist  Ambulation/Gait Ambulation/Gait assistance: Min assist Ambulation Distance (Feet): 1 Feet Assistive device: Rolling walker (2 wheeled) Gait Pattern/deviations: Step-to pattern;Decreased step length - right;Decreased step length - left;Shuffle;Trunk flexed Gait velocity: decr   General Gait Details: Cues for sequence, posture and position from RW.  Pt tolerating min WB on R LE.  Pt unable to tolerate pain level and did stand pvt inside RW to transfer to  chair   Stairs            Wheelchair Mobility    Modified Rankin (Stroke Patients Only)       Balance                                    Cognition Arousal/Alertness: Awake/alert Behavior During Therapy: WFL for tasks assessed/performed Overall Cognitive Status: Within Functional Limits for tasks assessed                      Exercises Total Joint Exercises Ankle Circles/Pumps: AROM;Both;15 reps;Supine Quad Sets: AROM;Both;5 reps;Supine Hip ABduction/ADduction: AAROM;Right;10 reps;Supine Straight Leg Raises: AAROM;Right;10 reps;Supine    General Comments        Pertinent Vitals/Pain Pain Assessment: 0-10 Pain Score: 8  Pain Location: R knee with activity Pain Descriptors / Indicators: Aching;Burning;Sore Pain Intervention(s): Limited activity within patient's tolerance;Monitored during session;Premedicated before session;Ice applied    Home Living                      Prior Function            PT Goals (current goals can now be found in the care plan section) Acute Rehab PT Goals Patient Stated Goal: Regain IND PT Goal Formulation: With patient Time For Goal Achievement: 09/19/15 Potential to Achieve Goals: Good Progress towards PT goals: Progressing toward goals    Frequency  7X/week    PT Plan Current plan remains appropriate  Co-evaluation             End of Session Equipment Utilized During Treatment: Gait belt;Right knee immobilizer Activity Tolerance: Patient limited by pain Patient left: with call bell/phone within reach;in chair     Time: 0825-0900 PT Time Calculation (min) (ACUTE ONLY): 35 min  Charges:  $Therapeutic Exercise: 8-22 mins $Therapeutic Activity: 8-22 mins                    G Codes:      Monico Sudduth 09-21-15, 9:05 AM

## 2015-09-17 NOTE — Progress Notes (Signed)
Subjective: 3 Days Post-Op Procedure(s) (LRB): RIGHT TOTAL KNEE RESSECTION ARTHROPLASTY WITH ANTIBIOTIC SPACERS (Right) Patient reports pain as mild and moderate.   Patient seen in rounds with Dr. Lequita Halt.  Still has burning in the knee. Tolerating meds. Patient is having problems with pain in the knee and burning along the incision, requiring pain medications Patient is ready to go home later today. Greatly appreciate ID consult by Dr. Orvan Falconer.  Recommended to continue the Cipro 750 mg BID.  Will provide her with 6 weeks of medication on RX and then she will follow up with [facility] for further medication duration and/or changes.  Objective: Vital signs in last 24 hours: Temp:  [97.7 F (36.5 C)-97.9 F (36.6 C)] 97.7 F (36.5 C) (02/23 0500) Pulse Rate:  [79-89] 85 (02/23 0500) Resp:  [18] 18 (02/23 0500) BP: (103-131)/(54-75) 119/75 mmHg (02/23 0500) SpO2:  [85 %-98 %] 85 % (02/23 0500)  Intake/Output from previous day:  Intake/Output Summary (Last 24 hours) at 09/17/15 0851 Last data filed at 09/17/15 0807  Gross per 24 hour  Intake    960 ml  Output   1650 ml  Net   -690 ml    Intake/Output this shift: Total I/O In: -  Out: 300 [Urine:300]  Labs:  Recent Labs  09/15/15 0418 09/16/15 0426 09/17/15 0418  HGB 10.7* 10.1* 10.2*    Recent Labs  09/16/15 0426 09/17/15 0418  WBC 6.8 6.6  RBC 3.62* 3.59*  HCT 32.7* 32.6*  PLT 254 251    Recent Labs  09/15/15 0418 09/16/15 0426  NA 139 138  K 5.2* 5.2*  CL 107 106  CO2 26 25  BUN 14 14  CREATININE 0.72 0.74  GLUCOSE 145* 109*  CALCIUM 8.9 8.7*   No results for input(s): LABPT, INR in the last 72 hours.  EXAM: General - Patient is Alert, Appropriate and Oriented Extremity - Neurovascular intact Sensation intact distally Dorsiflexion/Plantar flexion intact Incision - clean, dry, healing, only scant bloody drainage for the previous hemovac site. Staples intact, no active drainage for the  incision. Motor Function - intact, moving foot and toes well on exam.   Assessment/Plan: 3 Days Post-Op Procedure(s) (LRB): RIGHT TOTAL KNEE RESSECTION ARTHROPLASTY WITH ANTIBIOTIC SPACERS (Right) Procedure(s) (LRB): RIGHT TOTAL KNEE RESSECTION ARTHROPLASTY WITH ANTIBIOTIC SPACERS (Right) Past Medical History  Diagnosis Date  . Hyperlipidemia   . Status post primary angioplasty with coronary stent     DRUG-ELUTING STENT X1  TO PROXIMAL LAD  . Chronic back pain   . Chronic neck pain   . Arthritis     HANDS, KNEES  . DDD (degenerative disc disease), cervical   . DDD (degenerative disc disease), lumbar   . Radicular pain in left arm OCCASIONAL PAIN SHOOTING DOWN LEFT ARM SECAONDARY TO  CERVICAL DEGENERATION  . Hemorrhoid   . Diverticulitis of colon   . B12 deficiency anemia   . Vitamin D deficiency   . Hypothyroidism   . Synovitis of knee HYPERTROPHIC RIGHT KNEE  . Abnormal stress test 01-22-2009    LOW RISK ADENOSINE NUCLEAR STUDY W/ PROBABLE MILD APICAL THINNING BUT NO ISCHEMIA  . Depression     states well controlled  . GERD (gastroesophageal reflux disease)   . Inguinal hernia, right     with ventral hernia  . Hemorrhoids   . Coronary artery disease 2005    STRESS TEST /, NOte Dr Kirtland Bouchard EPIC/ no cardiologist now--STATES HEART STENT PLACED 2010  . Bronchitis  hx of   . H/O hiatal hernia   . Pneumonia     hx of walking pneumonia  . Hypertension       PCP Dr Lysbeth Galas; pt currently on no medication for treatment   . Sleep apnea     couldnt tolerate machine- states last sleep study " many years ago"  . Diabetes mellitus without complication (HCC)     pt states since having gastric banding does not have take medication was on metformin in past  . History of urinary tract infection   . Urinary incontinence    Principal Problem:   Infection of prosthetic right knee joint (HCC) Active Problems:   Hypothyroidism   Hyperlipidemia   OBESITY, MORBID   Essential  hypertension   Coronary atherosclerosis   GASTROESOPHAGEAL REFLUX DISEASE   Osteoarthritis   Sleep apnea  Estimated body mass index is 38.44 kg/(m^2) as calculated from the following:   Height as of this encounter: 5' 4.5" (1.638 m).   Weight as of this encounter: 103.137 kg (227 lb 6 oz). Up with therapy Discharge home with home health Diet - Cardiac diet and Diabetic diet Follow up - in 2 weeks Activity - WBAT, Knee Immobilizer at all time, No bending or motion to the knee. Disposition - Home Condition Upon Discharge - Stable D/C Meds - See DC Summary DVT Prophylaxis - Xarelto  [name], PA-C Orthopaedic Surgery 09/17/2015, 8:51 AM

## 2015-09-17 NOTE — Progress Notes (Signed)
[facility]    [facility] is providing the following services: wheelchair  If patient discharges after hours, please call [phone].   Renard Hamper 09/17/2015, 9:59 AM

## 2015-09-17 NOTE — Progress Notes (Signed)
CM called [facility] DME rep, Lecretia to please deliver the wheelchair with elevated leg rests to room so pt can discharge.  No other CM needs were communicated.

## 2015-09-21 NOTE — Anesthesia Postprocedure Evaluation (Signed)
Anesthesia Post Note  Patient: Andrea Norton  Procedure(s) Performed: Procedure(s) (LRB): RIGHT TOTAL KNEE RESSECTION ARTHROPLASTY WITH ANTIBIOTIC SPACERS (Right)  Patient location during evaluation: PACU Anesthesia Type: General Level of consciousness: awake Pain management: pain level controlled Vital Signs Assessment: post-procedure vital signs reviewed and stable Respiratory status: spontaneous breathing Cardiovascular status: stable Postop Assessment: no signs of nausea or vomiting Anesthetic complications: no    Last Vitals:  Filed Vitals:   09/16/15 2150 09/17/15 0500  BP: 131/68 119/75  Pulse: 89 85  Temp: 36.5 C 36.5 C  Resp: 18 18    Last Pain:  Filed Vitals:   09/17/15 1143  PainSc: 3                  Riddick Nuon

## 2015-10-22 ENCOUNTER — Ambulatory Visit (INDEPENDENT_AMBULATORY_CARE_PROVIDER_SITE_OTHER): Payer: Medicare Other | Admitting: Internal Medicine

## 2015-10-22 NOTE — Progress Notes (Deleted)
[facility] for Infectious Disease  Patient Active Problem List   Diagnosis Date Noted  . Infection of prosthetic right knee joint ([place]) 09/15/2015    Priority: High  . Osteoarthritis 01/09/2009    Priority: High  . Septic arthritis of knee, right ([place]) 09/14/2015  . Quadriceps tendon rupture 06/02/2015  . Cellulitis of right knee 03/07/2015  . 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 Postoperative anemia due to acute blood loss 02/21/2013  . 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 Instability of prosthetic knee ([place]) 02/01/2012  . Hypothyroidism 01/06/2009  . Hyperlipidemia 01/06/2009  . OBESITY, MORBID 01/06/2009  . Essential hypertension 01/06/2009  . Coronary atherosclerosis 01/06/2009  . GASTROESOPHAGEAL REFLUX DISEASE 01/06/2009  . Sleep apnea 01/06/2009  . DYSPNEA 01/06/2009  . DIVERTICULITIS, HX OF 01/06/2009    Patient's Medications  New Prescriptions   No medications on file  Previous Medications   CIPROFLOXACIN (CIPRO) 750 MG TABLET    Take 1 tablet (750 mg total) by mouth 2 (two) times daily. Take twice a day for 6 weeks.   ESCITALOPRAM (LEXAPRO) 10 MG TABLET    Take 10 mg by mouth at bedtime.   GABAPENTIN (NEURONTIN) 100 MG CAPSULE    Take 300 mg by mouth 2 (two) times daily.    HYDROMORPHONE (DILAUDID) 2 MG TABLET    Take 1-3 tablets (2-6 mg total) by mouth every 4 (four) hours as needed for moderate pain or severe pain.   LEVOTHYROXINE (SYNTHROID, LEVOTHROID) 125 MCG TABLET    Take 125 mcg by mouth daily before breakfast. Brand Name Only   METHOCARBAMOL (ROBAXIN) 500 MG TABLET    Take 1 tablet (500 mg total) by mouth every 6 (six) hours as needed for muscle spasms.   PANTOPRAZOLE (PROTONIX) 40 MG TABLET    Take 40 mg by mouth daily with lunch.    PRESCRIPTION MEDICATION    Apply 1 application topically 2 (two) times daily as needed  (Itching). 3 in one cream. Ketoconazole Cream 2%, SSD cream 1%, and Triamcinolone Cream 0.1%. Compounded at [facility] in [place]   RIVAROXABAN (XARELTO) 10 MG TABS TABLET    Take 1 tablet (10 mg total) by mouth daily with breakfast. Take Xarelto for two and a half more weeks, then discontinue Xarelto. Once the patient has completed the Xarelto, they may resume the 81 mg Aspirin.   SIMVASTATIN (ZOCOR) 40 MG TABLET    Take 40 mg by mouth daily.  Modified Medications   No medications on file  Discontinued Medications   No medications on file    Subjective: [name] is in for her hospital follow-up visit. She is a 70 y.o. female with multiple medical problems including degenerative joint disease. She underwent right total knee arthroplasty in 2006. She has required multiple surgeries on that knee since that time (see time line constructed by Arlee Muslim attached below). She's been treated for Pseudomonas infection of her right prosthetic knee on 2 previous occasions, the first in 2011 and again last August. She states that she has had swelling, warmth, redness and increased pain in her right knee ever since her surgery last August. She has had difficulty bearing weight. Her right knee was aspirated in the office on 09/03/2015 with cultures growing Pseudomonas again, still sensitive to ciprofloxacin. She was admitted on 09/14/2015 and underwent excision arthroplasty and spacer placement. Operative cultures grew  ciprofloxacin sensitive Pseudomonas again. She is now completed 38 days of ciprofloxacin postoperatively.  Review of Systems: ROS  Past Medical History  Diagnosis Date  . Hyperlipidemia   . Status post primary angioplasty with coronary stent     DRUG-ELUTING STENT X1  TO PROXIMAL LAD  . Chronic back pain   . Chronic neck pain   . Arthritis     HANDS, KNEES  . DDD (degenerative disc disease), cervical   . DDD (degenerative disc disease), lumbar   . Radicular pain in left arm  OCCASIONAL PAIN SHOOTING DOWN LEFT ARM SECAONDARY TO  CERVICAL DEGENERATION  . Hemorrhoid   . Diverticulitis of colon   . B12 deficiency anemia   . Vitamin D deficiency   . Hypothyroidism   . Synovitis of knee HYPERTROPHIC RIGHT KNEE  . Abnormal stress test 01-22-2009    LOW RISK ADENOSINE NUCLEAR STUDY W/ PROBABLE MILD APICAL THINNING BUT NO ISCHEMIA  . Depression     states well controlled  . GERD (gastroesophageal reflux disease)   . Inguinal hernia, right     with ventral hernia  . Hemorrhoids   . Coronary artery disease 2005    STRESS TEST /, NOte Dr [place] EPIC/ no cardiologist now--STATES [place] 2010  . Bronchitis     hx of   . H/O hiatal hernia   . Pneumonia     hx of walking pneumonia  . Hypertension       PCP Dr Edrick Oh; pt currently on no medication for treatment   . Sleep apnea     couldnt tolerate machine- states last sleep study " many years ago"  . Diabetes mellitus without complication ([place])     pt states since having gastric banding does not have take medication was on metformin in past  . History of urinary tract infection   . Urinary incontinence     Social History  Substance Use Topics  . Smoking status: Former Smoker -- 1.00 packs/day for 30 years    Types: Cigarettes    Quit date: 07/31/2002  . Smokeless tobacco: Never Used  . Alcohol Use: No    Family History  Problem Relation Age of Onset  . COPD Mother   . Cancer Father     lung    No Known Allergies  Objective: There were no vitals filed for this visit. There is no weight on file to calculate BMI.  Physical Exam  Lab Results SED RATE (mm/hr)  Date Value  03/07/2015 41*  01/17/2010 59*   CRP (mg/dL)  Date Value  03/07/2015 1.4*     Problem List Items Addressed This Visit    None       Michel Bickers, MD [facility] for Infectious [place] [phone] pager   [phone] cell 10/22/2015, 11:16 AM

## 2015-10-28 ENCOUNTER — Ambulatory Visit: Payer: Medicare Other | Admitting: Internal Medicine

## 2015-10-29 ENCOUNTER — Ambulatory Visit (INDEPENDENT_AMBULATORY_CARE_PROVIDER_SITE_OTHER): Payer: Medicare Other | Admitting: Internal Medicine

## 2015-10-29 ENCOUNTER — Encounter: Payer: Self-pay | Admitting: Internal Medicine

## 2015-10-29 VITALS — BP 107/71 | HR 81 | Temp 97.7°F

## 2015-10-29 DIAGNOSIS — T8453XA Infection and inflammatory reaction due to internal right knee prosthesis, initial encounter: Secondary | ICD-10-CM | POA: Diagnosis present

## 2015-10-29 NOTE — Progress Notes (Signed)
[facility]  Patient Active Problem List   Diagnosis Date Noted  . Infection of prosthetic right knee joint (HCC) 09/15/2015    Priority: High  . Osteoarthritis 01/09/2009    Priority: High  . Septic arthritis of knee, right (HCC) 09/14/2015  . Quadriceps tendon rupture 06/02/2015  . Cellulitis of right knee 03/07/2015  . 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 Postoperative anemia due to acute blood loss 02/21/2013  . 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 Instability of prosthetic knee (HCC) 02/01/2012  . Hypothyroidism 01/06/2009  . Hyperlipidemia 01/06/2009  . OBESITY, MORBID 01/06/2009  . Essential hypertension 01/06/2009  . Coronary atherosclerosis 01/06/2009  . GASTROESOPHAGEAL REFLUX DISEASE 01/06/2009  . Sleep apnea 01/06/2009  . DYSPNEA 01/06/2009  . DIVERTICULITIS, HX OF 01/06/2009    Patient's Medications  New Prescriptions   No medications on file  Previous Medications   ESCITALOPRAM (LEXAPRO) 10 MG TABLET    Take 10 mg by mouth at bedtime.   GABAPENTIN (NEURONTIN) 100 MG CAPSULE    Take 300 mg by mouth 2 (two) times daily.    HYDROMORPHONE (DILAUDID) 2 MG TABLET    Take 1-3 tablets (2-6 mg total) by mouth every 4 (four) hours as needed for moderate pain or severe pain.   LEVOTHYROXINE (SYNTHROID, LEVOTHROID) 125 MCG TABLET    Take 125 mcg by mouth daily before breakfast. Brand Name Only   METHOCARBAMOL (ROBAXIN) 500 MG TABLET    Take 1 tablet (500 mg total) by mouth every 6 (six) hours as needed for muscle spasms.   PANTOPRAZOLE (PROTONIX) 40 MG TABLET    Take 40 mg by mouth daily with lunch.    PRESCRIPTION MEDICATION    Apply 1 application topically 2 (two) times daily as needed (Itching). 3 in one cream. Ketoconazole Cream 2%, SSD cream 1%, and Triamcinolone Cream 0.1%. Compounded at [facility] in [place]   RIVAROXABAN (XARELTO) 10 MG TABS TABLET    Take 1 tablet (10 mg total) by mouth daily with breakfast. Take Xarelto for two and a half more weeks, then discontinue Xarelto. Once the patient has completed the Xarelto, they may resume the 81 mg Aspirin.   SIMVASTATIN (ZOCOR) 40 MG TABLET    Take 40 mg by mouth daily.  Modified Medications   No medications on file  Discontinued Medications   CIPROFLOXACIN (CIPRO) 750 MG TABLET    Take 1 tablet (750 mg total) by mouth 2 (two) times daily. Take twice a day for 6 weeks.    Subjective: Andrea Norton is in for her hospital follow-up visit. 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 Operative cultures grew the same Pseudomonas that have been isolated last fall. Her isolate remain sensitive to fluoroquinolone antibiotics. She was discharged on oral ciprofloxacin and completed 6 weeks of therapy yesterday. She saw Dr. Lequita HaltAluisio recently who told her that her infection was cured (I believe on the basis of improved inflammatory markers). She tells me that he is planning revision arthroplasty within the next 3-4 weeks if she continues to do well. She had no problem tolerating her ciprofloxacin. She still has pain in her right knee and is unable to bear weight. She has had some numbness over the dorsum of her foot since her most recent surgery.  Review of Systems:  Review of Systems  Constitutional:       As noted in history present illness.    Past Medical History  Diagnosis Date  . Hyperlipidemia   . Status post primary angioplasty with coronary stent     DRUG-ELUTING STENT X1  TO PROXIMAL LAD  . Chronic back pain   . Chronic neck pain   . Arthritis     HANDS, KNEES  . DDD (degenerative disc disease), cervical   . DDD (degenerative disc disease), lumbar   . Radicular pain in left arm OCCASIONAL PAIN SHOOTING DOWN LEFT ARM SECAONDARY TO  CERVICAL DEGENERATION    . Hemorrhoid   . Diverticulitis of colon   . B12 deficiency anemia   . Vitamin D deficiency   . Hypothyroidism   . Synovitis of knee HYPERTROPHIC RIGHT KNEE  . Abnormal stress test 01-22-2009    LOW RISK ADENOSINE NUCLEAR STUDY W/ PROBABLE MILD APICAL THINNING BUT NO ISCHEMIA  . Depression     states well controlled  . GERD (gastroesophageal reflux disease)   . Inguinal hernia, right     with ventral hernia  . Hemorrhoids   . Coronary artery disease 2005    STRESS TEST /, NOte Dr Kirtland Bouchard EPIC/ no cardiologist now--STATES HEART STENT PLACED 2010  . Bronchitis     hx of   . H/O hiatal hernia   . Pneumonia     hx of walking pneumonia  . Hypertension       PCP Dr Lysbeth Galas; pt currently on no medication for treatment   . Sleep apnea     couldnt tolerate machine- states last sleep study " many years ago"  . Diabetes mellitus without complication (HCC)     pt states since having gastric banding does not have take medication was on metformin in past  . History of urinary tract infection   . Urinary incontinence     Social History  Substance Use Topics  . Smoking status: Former Smoker -- 1.00 packs/day for 30 years    Types: Cigarettes    Quit date: 07/31/2002  . Smokeless tobacco: Never Used  . Alcohol Use: No    Family History  Problem Relation Age of Onset  . COPD Mother   . Cancer Father     lung    No Known Allergies  Objective: Filed Vitals:   10/29/15 1518  BP: 107/71  Pulse: 81  Temp: 97.7 F (36.5 C)  TempSrc: Oral   There is no weight on file to calculate BMI.  Physical Exam  Constitutional: She is oriented to person, place, and time.  She is seated in her wheelchair. She is in no distress. She is accompanied by her husband.  Musculoskeletal:  Her right knee incision is healing well. She has no redness or unusual warmth.  Neurological: She is alert and oriented to person, place, and time.  Skin: Rash noted.  Psychiatric: Mood and affect normal.     Lab Results    Problem List Items Addressed This Visit      High   Infection of prosthetic right knee joint (HCC) - Primary    I am hopeful that her persistent Pseudomonas infection has now been cured with a combination of resection arthroplasty in 6 weeks of oral ciprofloxacin. I will observe off of antibiotics for now.          Cliffton Asters, MD [facility] for Infectious Disease [facility] [phone] pager   [phone] cell 10/29/2015, 3:49 PM

## 2015-10-29 NOTE — Assessment & Plan Note (Signed)
I am hopeful that her persistent Pseudomonas infection has now been cured with a combination of resection arthroplasty in 6 weeks of oral ciprofloxacin. I will observe off of antibiotics for now.

## 2015-11-13 NOTE — Patient Instructions (Addendum)
Andrea Norton  11/13/2015   Your procedure is scheduled on: 11/23/15  [facility]  [facility] 3rd floor to  Short Stay Center at 11:55AM.  Call this number if you have problems the morning of surgery [phone]   Remember: ONLY 1 PERSON MAY GO WITH YOU TO SHORT STAY TO GET  READY MORNING OF YOUR SURGERY.  Do not eat food or drink liquids :After Midnight. You may have clear liquids until 8:55AM morning of surgery.     Take these medicines the morning of surgery with A SIP OF WATER: Gabapentin (Neurontin), Levothyroxine (Synthroid), Protonix.              You may not have any metal on your body including hair pins and              piercings  Do not wear jewelry, make-up, lotions, powders or perfumes, deodorant             Do not wear nail polish.  Do not shave  48 hours prior to surgery.              Men may shave face and neck.   Do not bring valuables to the hospital. [facility] IS NOT             RESPONSIBLE   FOR VALUABLES.  Contacts, dentures or bridgework may not be worn into surgery.  Leave suitcase in the car. After surgery it may be brought to your room.    Special Instructions:coughing and deep breathing exercises, leg exercises WHAT IS A BLOOD TRANSFUSION? Blood Transfusion Information  A transfusion is the replacement of blood or some of its parts. Blood is made up of multiple cells which provide different functions.  Red blood cells carry oxygen and are used for blood loss replacement.  White blood cells fight against infection.  Platelets control bleeding.  Plasma helps clot blood.  Other blood products are available for specialized needs, such as hemophilia or other clotting disorders. BEFORE THE TRANSFUSION  Who gives blood for transfusions?   Healthy volunteers who are fully evaluated to make sure their blood is safe. This is blood bank blood. Transfusion therapy is the safest it has ever been in  the practice of medicine. Before blood is taken from a donor, a complete history is taken to make sure that person has no history of diseases nor engages in risky social behavior (examples are intravenous drug use or sexual activity with multiple partners). The donor's travel history is screened to minimize risk of transmitting infections, such as malaria. The donated blood is tested for signs of infectious diseases, such as HIV and hepatitis. The blood is then tested to be sure it is compatible with you in order to minimize the chance of a transfusion reaction. If you or a relative donates blood, this is often done in anticipation of surgery and is not appropriate for emergency situations. It takes many days to process the donated blood. RISKS AND COMPLICATIONS Although transfusion therapy is very safe and saves many lives, the main dangers of transfusion include:  1. Getting an infectious disease. 2. Developing a transfusion reaction. This is an allergic reaction to something in the blood you were given. Every precaution is taken to prevent this. The decision to have a blood transfusion has been considered carefully by your caregiver before blood is  given. Blood is not given unless the benefits outweigh the risks. AFTER THE TRANSFUSION  Right after receiving a blood transfusion, you will usually feel much better and more energetic. This is especially true if your red blood cells have gotten low (anemic). The transfusion raises the level of the red blood cells which carry oxygen, and this usually causes an energy increase.  The nurse administering the transfusion will monitor you carefully for complications. HOME CARE INSTRUCTIONS  No special instructions are needed after a transfusion. You may find your energy is better. Speak with your caregiver about any limitations on activity for underlying diseases you may have. SEEK MEDICAL CARE IF:   Your condition is not improving after your  transfusion.  You develop redness or irritation at the intravenous (IV) site. SEEK IMMEDIATE MEDICAL CARE IF:  Any of the following symptoms occur over the next 12 hours:  Shaking chills.  You have a temperature by mouth above 102 F (38.9 C), not controlled by medicine.  Chest, back, or muscle pain.  People around you feel you are not acting correctly or are confused.  Shortness of breath or difficulty breathing.  Dizziness and fainting.  You get a rash or develop hives.  You have a decrease in urine output.  Your urine turns a dark color or changes to pink, red, or brown. Any of the following symptoms occur over the next 10 days:  You have a temperature by mouth above 102 F (38.9 C), not controlled by medicine.  Shortness of breath.  Weakness after normal activity.  The white part of the eye turns yellow (jaundice).  You have a decrease in the amount of urine or are urinating less often.  Your urine turns a dark color or changes to pink, red, or brown. Document Released: 07/08/2000 Document Revised: 10/03/2011 Document Reviewed: 02/25/2008 ExitCare Patient Information 2014 [place], [place].  _______________________________________________________________________  Incentive Spirometer  An incentive spirometer is a tool that can help keep your lungs clear and active. This tool measures how well you are filling your lungs with each breath. Taking long deep breaths may help reverse or decrease the chance of developing breathing (pulmonary) problems (especially infection) following:  A long period of time when you are unable to move or be active. BEFORE THE PROCEDURE   If the spirometer includes an indicator to show your best effort, your nurse or respiratory therapist will set it to a desired goal.  If possible, sit up straight or lean slightly forward. Try not to slouch.  Hold the incentive spirometer in an upright position. INSTRUCTIONS FOR USE  3. Sit on the  edge of your bed if possible, or sit up as far as you can in bed or on a chair. 4. Hold the incentive spirometer in an upright position. 5. Breathe out normally. 6. Place the mouthpiece in your mouth and seal your lips tightly around it. 7. Breathe in slowly and as deeply as possible, raising the piston or the ball toward the top of the column. 8. Hold your breath for 3-5 seconds or for as long as possible. Allow the piston or ball to fall to the bottom of the column. 9. Remove the mouthpiece from your mouth and breathe out normally. 10. Rest for a few seconds and repeat Steps 1 through 7 at least 10 times every 1-2 hours when you are awake. Take your time and take a few normal breaths between deep breaths. 11. The spirometer may include an indicator to show your best  effort. Use the indicator as a goal to work toward during each repetition. 12. After each set of 10 deep breaths, practice coughing to be sure your lungs are clear. If you have an incision (the cut made at the time of surgery), support your incision when coughing by placing a pillow or rolled up towels firmly against it. Once you are able to get out of bed, walk around indoors and cough well. You may stop using the incentive spirometer when instructed by your caregiver.  RISKS AND COMPLICATIONS  Take your time so you do not get dizzy or light-headed.  If you are in pain, you may need to take or ask for pain medication before doing incentive spirometry. It is harder to take a deep breath if you are having pain. AFTER USE  Rest and breathe slowly and easily.  It can be helpful to keep track of a log of your progress. Your caregiver can provide you with a simple table to help with this. If you are using the spirometer at home, follow these instructions: SEEK MEDICAL CARE IF:   You are having difficultly using the spirometer.  You have trouble using the spirometer as often as instructed.  Your pain medication is not giving  enough relief while using the spirometer.  You develop fever of 100.5 F (38.1 C) or higher. SEEK IMMEDIATE MEDICAL CARE IF:   You cough up bloody sputum that had not been present before.  You develop fever of 102 F (38.9 C) or greater.  You develop worsening pain at or near the incision site. MAKE SURE YOU:   Understand these instructions.  Will watch your condition.  Will get help right away if you are not doing well or get worse. Document Released: 11/21/2006 Document Revised: 10/03/2011 Document Reviewed: 01/22/2007 ExitCare Patient Information 2014 [place].   ________________________________________________________________________               Please read over the following fact sheets you were given: _____________________________________________________________________             [facility] - Preparing for Surgery Before surgery, you can play an important role.  Because skin is not sterile, your skin needs to be as free of germs as possible.  You can reduce the number of germs on your skin by washing with CHG (chlorahexidine gluconate) soap before surgery.  CHG is an antiseptic cleaner which kills germs and bonds with the skin to continue killing germs even after washing. Please DO NOT use if you have an allergy to CHG or antibacterial soaps.  If your skin becomes reddened/irritated stop using the CHG and inform your nurse when you arrive at Short Stay. Do not shave (including legs and underarms) for at least 48 hours prior to the first CHG shower.  You may shave your face/neck. Please follow these instructions carefully:  1.  Shower with CHG Soap the night before surgery and the  morning of Surgery.  2.  If you choose to wash your hair, wash your hair first as usual with your  normal  shampoo.  3.  After you shampoo, rinse your hair and body thoroughly to remove the  shampoo.                           4.  Use CHG as you would any other liquid soap.  You can  apply chg directly  to the skin and wash  Gently with a scrungie or clean washcloth.  5.  Apply the CHG Soap to your body ONLY FROM THE NECK DOWN.   Do not use on face/ open                           Wound or open sores. Avoid contact with eyes, ears mouth and genitals (private parts).                       Wash face,  Genitals (private parts) with your normal soap.             6.  Wash thoroughly, paying special attention to the area where your surgery  will be performed.  7.  Thoroughly rinse your body with warm water from the neck down.  8.  DO NOT shower/wash with your normal soap after using and rinsing off  the CHG Soap.                9.  Pat yourself dry with a clean towel.            10.  Wear clean pajamas.            11.  Place clean sheets on your bed the night of your first shower and do not  sleep with pets. Day of Surgery : Do not apply any lotions/deodorants the morning of surgery.  Please wear clean clothes to the hospital/surgery center.  FAILURE TO FOLLOW THESE INSTRUCTIONS MAY RESULT IN THE CANCELLATION OF YOUR SURGERY PATIENT SIGNATURE_________________________________  NURSE SIGNATURE__________________________________  ________________________________________________________________________    CLEAR LIQUID DIET   Foods Allowed                                                                     Foods Excluded  Coffee and tea, regular and decaf                             liquids that you cannot  Plain Jell-O in any flavor                                             see through such as: Fruit ices (not with fruit pulp)                                     milk, soups, orange juice  Iced Popsicles                                    All solid food Carbonated beverages, regular and diet                                    Cranberry, grape and apple juices Sports drinks like Gatorade Lightly seasoned clear broth or consume(fat free) Sugar, honey  syrup  Sample Menu Breakfast                                Lunch                                     Supper Cranberry juice                    Beef broth                            Chicken broth Jell-O                                     Grape juice                           Apple juice Coffee or tea                        Jell-O                                      Popsicle                                                Coffee or tea                        Coffee or tea  _____________________________________________________________________

## 2015-11-17 ENCOUNTER — Ambulatory Visit: Payer: Self-pay | Admitting: Orthopedic Surgery

## 2015-11-17 ENCOUNTER — Encounter (HOSPITAL_COMMUNITY)
Admission: RE | Admit: 2015-11-17 | Discharge: 2015-11-17 | Disposition: A | Discharge status: Home / Self Care | Payer: Medicare Other | Source: Ambulatory Visit | Attending: Orthopedic Surgery | Admitting: Orthopedic Surgery

## 2015-11-17 ENCOUNTER — Encounter (HOSPITAL_COMMUNITY): Payer: Self-pay

## 2015-11-17 DIAGNOSIS — E785 Hyperlipidemia, unspecified: Secondary | ICD-10-CM | POA: Diagnosis not present

## 2015-11-17 DIAGNOSIS — E119 Type 2 diabetes mellitus without complications: Secondary | ICD-10-CM | POA: Insufficient documentation

## 2015-11-17 DIAGNOSIS — F329 Major depressive disorder, single episode, unspecified: Secondary | ICD-10-CM | POA: Diagnosis not present

## 2015-11-17 DIAGNOSIS — Z01812 Encounter for preprocedural laboratory examination: Secondary | ICD-10-CM | POA: Diagnosis present

## 2015-11-17 DIAGNOSIS — K219 Gastro-esophageal reflux disease without esophagitis: Secondary | ICD-10-CM | POA: Diagnosis not present

## 2015-11-17 DIAGNOSIS — E039 Hypothyroidism, unspecified: Secondary | ICD-10-CM | POA: Insufficient documentation

## 2015-11-17 LAB — APTT: APTT: 29 s (ref 24–37)

## 2015-11-17 LAB — COMPREHENSIVE METABOLIC PANEL
ALBUMIN: 3.6 g/dL (ref 3.5–5.0)
ALT: 10 U/L — ABNORMAL LOW (ref 14–54)
ANION GAP: 7 (ref 5–15)
AST: 17 U/L (ref 15–41)
Alkaline Phosphatase: 78 U/L (ref 38–126)
BILIRUBIN TOTAL: 0.9 mg/dL (ref 0.3–1.2)
BUN: 10 mg/dL (ref 6–20)
CO2: 28 mmol/L (ref 22–32)
Calcium: 9.3 mg/dL (ref 8.9–10.3)
Chloride: 106 mmol/L (ref 101–111)
Creatinine, Ser: 0.68 mg/dL (ref 0.44–1.00)
Glucose, Bld: 99 mg/dL (ref 65–99)
POTASSIUM: 4.4 mmol/L (ref 3.5–5.1)
Sodium: 141 mmol/L (ref 135–145)
TOTAL PROTEIN: 7.3 g/dL (ref 6.5–8.1)

## 2015-11-17 LAB — CBC
HEMATOCRIT: 40.1 % (ref 36.0–46.0)
Hemoglobin: 13.1 g/dL (ref 12.0–15.0)
MCH: 26.8 pg (ref 26.0–34.0)
MCHC: 32.7 g/dL (ref 30.0–36.0)
MCV: 82.2 fL (ref 78.0–100.0)
Platelets: 217 10*3/uL (ref 150–400)
RBC: 4.88 MIL/uL (ref 3.87–5.11)
RDW: 14.3 % (ref 11.5–15.5)
WBC: 6.3 10*3/uL (ref 4.0–10.5)

## 2015-11-17 LAB — URINALYSIS, ROUTINE W REFLEX MICROSCOPIC
BILIRUBIN URINE: NEGATIVE
Glucose, UA: NEGATIVE mg/dL
HGB URINE DIPSTICK: NEGATIVE
Ketones, ur: NEGATIVE mg/dL
Leukocytes, UA: NEGATIVE
Nitrite: NEGATIVE
Protein, ur: NEGATIVE mg/dL
SPECIFIC GRAVITY, URINE: 1.01 (ref 1.005–1.030)
pH: 7 (ref 5.0–8.0)

## 2015-11-17 LAB — PROTIME-INR
INR: 1.02 (ref 0.00–1.49)
PROTHROMBIN TIME: 13.6 s (ref 11.6–15.2)

## 2015-11-17 LAB — SURGICAL PCR SCREEN
MRSA, PCR: NEGATIVE
Staphylococcus aureus: NEGATIVE

## 2015-11-17 NOTE — Progress Notes (Addendum)
05-28-15 - EKG - EPIC 03-07-15 - CXR - EPIC 06-24-15 - LOV - Dr. Lysbeth GalasNyland (fam.med.) - Care Everywhere  10-29-15 - LOV - Dr. Orvan Falconerampbell (inf. Dis.) - EPIC

## 2015-11-17 NOTE — Progress Notes (Signed)
Preoperative surgical orders have been place into the Epic hospital system for Andrea Norton on 11/17/2015, 10:04 AM  by Patrica DuelPERKINS, Kriti Katayama for surgery on 11-23-2015.  Preop Total Knee orders including Experal, IV Tylenol, and IV Decadron as long as there are no contraindications to the above medications. Avel Peacerew Dalessandro Baldyga, PA-C

## 2015-11-19 ENCOUNTER — Ambulatory Visit: Payer: Self-pay | Admitting: Orthopedic Surgery

## 2015-11-23 ENCOUNTER — Encounter (HOSPITAL_COMMUNITY)
Admission: RE | Disposition: A | Discharge status: Home Health | Payer: Self-pay | Source: Ambulatory Visit | Attending: Orthopedic Surgery

## 2015-11-23 ENCOUNTER — Inpatient Hospital Stay (HOSPITAL_COMMUNITY): Payer: Medicare Other | Admitting: Anesthesiology

## 2015-11-23 ENCOUNTER — Encounter (HOSPITAL_COMMUNITY): Payer: Self-pay

## 2015-11-23 ENCOUNTER — Inpatient Hospital Stay (HOSPITAL_COMMUNITY)
Admission: RE | Admit: 2015-11-23 | Discharge: 2015-11-25 | DRG: 467 | Disposition: A | Discharge status: Home Health | Payer: Medicare Other | Source: Ambulatory Visit | Attending: Orthopedic Surgery | Admitting: Orthopedic Surgery

## 2015-11-23 DIAGNOSIS — Z01812 Encounter for preprocedural laboratory examination: Secondary | ICD-10-CM | POA: Diagnosis not present

## 2015-11-23 DIAGNOSIS — M009 Pyogenic arthritis, unspecified: Secondary | ICD-10-CM | POA: Diagnosis not present

## 2015-11-23 DIAGNOSIS — I2510 Atherosclerotic heart disease of native coronary artery without angina pectoris: Secondary | ICD-10-CM | POA: Diagnosis present

## 2015-11-23 DIAGNOSIS — Y831 Surgical operation with implant of artificial internal device as the cause of abnormal reaction of the patient, or of later complication, without mention of misadventure at the time of the procedure: Secondary | ICD-10-CM | POA: Diagnosis present

## 2015-11-23 DIAGNOSIS — Z7982 Long term (current) use of aspirin: Secondary | ICD-10-CM

## 2015-11-23 DIAGNOSIS — E119 Type 2 diabetes mellitus without complications: Secondary | ICD-10-CM | POA: Diagnosis present

## 2015-11-23 DIAGNOSIS — T8453XA Infection and inflammatory reaction due to internal right knee prosthesis, initial encounter: Secondary | ICD-10-CM | POA: Diagnosis not present

## 2015-11-23 DIAGNOSIS — Z955 Presence of coronary angioplasty implant and graft: Secondary | ICD-10-CM | POA: Diagnosis not present

## 2015-11-23 DIAGNOSIS — Z8701 Personal history of pneumonia (recurrent): Secondary | ICD-10-CM | POA: Diagnosis not present

## 2015-11-23 DIAGNOSIS — G4730 Sleep apnea, unspecified: Secondary | ICD-10-CM | POA: Diagnosis present

## 2015-11-23 DIAGNOSIS — M549 Dorsalgia, unspecified: Secondary | ICD-10-CM | POA: Diagnosis present

## 2015-11-23 DIAGNOSIS — Z87440 Personal history of urinary (tract) infections: Secondary | ICD-10-CM

## 2015-11-23 DIAGNOSIS — Z96652 Presence of left artificial knee joint: Secondary | ICD-10-CM | POA: Diagnosis present

## 2015-11-23 DIAGNOSIS — M00861 Arthritis due to other bacteria, right knee: Secondary | ICD-10-CM

## 2015-11-23 DIAGNOSIS — E039 Hypothyroidism, unspecified: Secondary | ICD-10-CM | POA: Diagnosis present

## 2015-11-23 DIAGNOSIS — M179 Osteoarthritis of knee, unspecified: Secondary | ICD-10-CM | POA: Diagnosis present

## 2015-11-23 DIAGNOSIS — Z79899 Other long term (current) drug therapy: Secondary | ICD-10-CM | POA: Diagnosis not present

## 2015-11-23 DIAGNOSIS — K219 Gastro-esophageal reflux disease without esophagitis: Secondary | ICD-10-CM | POA: Diagnosis present

## 2015-11-23 DIAGNOSIS — Z87891 Personal history of nicotine dependence: Secondary | ICD-10-CM | POA: Diagnosis not present

## 2015-11-23 DIAGNOSIS — Z6837 Body mass index (BMI) 37.0-37.9, adult: Secondary | ICD-10-CM

## 2015-11-23 DIAGNOSIS — Z825 Family history of asthma and other chronic lower respiratory diseases: Secondary | ICD-10-CM

## 2015-11-23 DIAGNOSIS — I10 Essential (primary) hypertension: Secondary | ICD-10-CM | POA: Diagnosis not present

## 2015-11-23 DIAGNOSIS — M1711 Unilateral primary osteoarthritis, right knee: Secondary | ICD-10-CM | POA: Diagnosis present

## 2015-11-23 DIAGNOSIS — M542 Cervicalgia: Secondary | ICD-10-CM | POA: Diagnosis present

## 2015-11-23 DIAGNOSIS — M1710 Unilateral primary osteoarthritis, unspecified knee: Secondary | ICD-10-CM | POA: Diagnosis present

## 2015-11-23 DIAGNOSIS — E785 Hyperlipidemia, unspecified: Secondary | ICD-10-CM | POA: Diagnosis present

## 2015-11-23 DIAGNOSIS — G8929 Other chronic pain: Secondary | ICD-10-CM | POA: Diagnosis present

## 2015-11-23 DIAGNOSIS — Z801 Family history of malignant neoplasm of trachea, bronchus and lung: Secondary | ICD-10-CM | POA: Diagnosis not present

## 2015-11-23 HISTORY — PX: TOTAL KNEE ARTHROPLASTY: SHX125

## 2015-11-23 LAB — TYPE AND SCREEN
ABO/RH(D): O POS
ANTIBODY SCREEN: NEGATIVE

## 2015-11-23 LAB — GLUCOSE, CAPILLARY: GLUCOSE-CAPILLARY: 97 mg/dL (ref 65–99)

## 2015-11-23 SURGERY — ARTHROPLASTY, KNEE, TOTAL
Anesthesia: Spinal | Site: Knee | Laterality: Right

## 2015-11-23 MED ORDER — CEFAZOLIN SODIUM-DEXTROSE 2-4 GM/100ML-% IV SOLN
2.0000 g | Freq: Four times a day (QID) | INTRAVENOUS | Status: AC
  Administered 2015-11-23 – 2015-11-24 (×2): 2 g via INTRAVENOUS
  Filled 2015-11-23 (×2): qty 100

## 2015-11-23 MED ORDER — 0.9 % SODIUM CHLORIDE (POUR BTL) OPTIME
TOPICAL | Status: DC | PRN
  Administered 2015-11-23: 1000 mL

## 2015-11-23 MED ORDER — ACETAMINOPHEN 650 MG RE SUPP: 650.0000 mg | Freq: Four times a day (QID) | RECTAL | Status: DC | PRN

## 2015-11-23 MED ORDER — LACTATED RINGERS IV SOLN
INTRAVENOUS | Status: DC | PRN
  Administered 2015-11-23 (×2): via INTRAVENOUS

## 2015-11-23 MED ORDER — ONDANSETRON HCL 4 MG PO TABS: 4.0000 mg | ORAL_TABLET | Freq: Four times a day (QID) | ORAL | Status: DC | PRN

## 2015-11-23 MED ORDER — GABAPENTIN 300 MG PO CAPS
300.0000 mg | ORAL_CAPSULE | Freq: Two times a day (BID) | ORAL | Status: DC
Start: 2015-11-23 — End: 2015-11-25
  Administered 2015-11-23 – 2015-11-25 (×4): 300 mg via ORAL
  Filled 2015-11-23 (×5): qty 1

## 2015-11-23 MED ORDER — CHLORHEXIDINE GLUCONATE 4 % EX LIQD: 60.0000 mL | Freq: Once | CUTANEOUS | Status: DC

## 2015-11-23 MED ORDER — MIDAZOLAM HCL 2 MG/2ML IJ SOLN: 0.5000 mg | Freq: Once | INTRAMUSCULAR | Status: DC | PRN

## 2015-11-23 MED ORDER — PROPOFOL 10 MG/ML IV BOLUS
INTRAVENOUS | Status: AC
  Filled 2015-11-23: qty 20

## 2015-11-23 MED ORDER — DOCUSATE SODIUM 100 MG PO CAPS
100.0000 mg | ORAL_CAPSULE | Freq: Two times a day (BID) | ORAL | Status: DC
  Administered 2015-11-23 – 2015-11-25 (×4): 100 mg via ORAL

## 2015-11-23 MED ORDER — LACTATED RINGERS IV SOLN: INTRAVENOUS | Status: DC

## 2015-11-23 MED ORDER — DEXAMETHASONE SODIUM PHOSPHATE 10 MG/ML IJ SOLN
10.0000 mg | Freq: Once | INTRAMUSCULAR | Status: AC
  Administered 2015-11-24: 10 mg via INTRAVENOUS
  Filled 2015-11-23: qty 1

## 2015-11-23 MED ORDER — SODIUM CHLORIDE 0.9 % IV SOLN
INTRAVENOUS | Status: DC
  Administered 2015-11-23: 20:00:00 via INTRAVENOUS

## 2015-11-23 MED ORDER — METOCLOPRAMIDE HCL 5 MG/ML IJ SOLN: 5.0000 mg | Freq: Three times a day (TID) | INTRAMUSCULAR | Status: DC | PRN

## 2015-11-23 MED ORDER — HYDROMORPHONE HCL 1 MG/ML IJ SOLN: 0.2500 mg | INTRAMUSCULAR | Status: DC | PRN

## 2015-11-23 MED ORDER — FENTANYL CITRATE (PF) 100 MCG/2ML IJ SOLN
INTRAMUSCULAR | Status: AC
  Filled 2015-11-23: qty 2

## 2015-11-23 MED ORDER — ONDANSETRON HCL 4 MG/2ML IJ SOLN
INTRAMUSCULAR | Status: AC
  Filled 2015-11-23: qty 2

## 2015-11-23 MED ORDER — METHOCARBAMOL 500 MG PO TABS
500.0000 mg | ORAL_TABLET | Freq: Four times a day (QID) | ORAL | Status: DC | PRN
  Administered 2015-11-24: 500 mg via ORAL
  Filled 2015-11-23: qty 1

## 2015-11-23 MED ORDER — METOCLOPRAMIDE HCL 10 MG PO TABS: 5.0000 mg | ORAL_TABLET | Freq: Three times a day (TID) | ORAL | Status: DC | PRN

## 2015-11-23 MED ORDER — BUPIVACAINE HCL 0.25 % IJ SOLN
INTRAMUSCULAR | Status: DC | PRN
  Administered 2015-11-23: 30 mL

## 2015-11-23 MED ORDER — POLYETHYLENE GLYCOL 3350 17 G PO PACK: 17.0000 g | PACK | Freq: Every day | ORAL | Status: DC | PRN

## 2015-11-23 MED ORDER — BISACODYL 10 MG RE SUPP: 10.0000 mg | Freq: Every day | RECTAL | Status: DC | PRN

## 2015-11-23 MED ORDER — MEPERIDINE HCL 50 MG/ML IJ SOLN: 6.2500 mg | INTRAMUSCULAR | Status: DC | PRN

## 2015-11-23 MED ORDER — DEXAMETHASONE SODIUM PHOSPHATE 10 MG/ML IJ SOLN
INTRAMUSCULAR | Status: AC
  Filled 2015-11-23: qty 1

## 2015-11-23 MED ORDER — DEXAMETHASONE SODIUM PHOSPHATE 10 MG/ML IJ SOLN: 10.0000 mg | Freq: Once | INTRAMUSCULAR | Status: DC

## 2015-11-23 MED ORDER — ACETAMINOPHEN 500 MG PO TABS
1000.0000 mg | ORAL_TABLET | Freq: Four times a day (QID) | ORAL | Status: AC
  Administered 2015-11-23 – 2015-11-24 (×4): 1000 mg via ORAL
  Filled 2015-11-23 (×4): qty 2

## 2015-11-23 MED ORDER — RIVAROXABAN 10 MG PO TABS
10.0000 mg | ORAL_TABLET | Freq: Every day | ORAL | Status: DC
Start: 2015-11-24 — End: 2015-11-25
  Administered 2015-11-24 – 2015-11-25 (×2): 10 mg via ORAL
  Filled 2015-11-23 (×3): qty 1

## 2015-11-23 MED ORDER — BUPIVACAINE LIPOSOME 1.3 % IJ SUSP
INTRAMUSCULAR | Status: DC | PRN
  Administered 2015-11-23: 20 mL

## 2015-11-23 MED ORDER — ACETAMINOPHEN 10 MG/ML IV SOLN
1000.0000 mg | Freq: Once | INTRAVENOUS | Status: AC
  Administered 2015-11-23: 1000 mg via INTRAVENOUS
  Filled 2015-11-23: qty 100

## 2015-11-23 MED ORDER — SIMVASTATIN 40 MG PO TABS
40.0000 mg | ORAL_TABLET | Freq: Every day | ORAL | Status: DC
  Administered 2015-11-23 – 2015-11-24 (×2): 40 mg via ORAL
  Filled 2015-11-23 (×3): qty 1

## 2015-11-23 MED ORDER — MENTHOL 3 MG MT LOZG: 1.0000 | LOZENGE | OROMUCOSAL | Status: DC | PRN

## 2015-11-23 MED ORDER — SODIUM CHLORIDE 0.9 % IV SOLN: INTRAVENOUS | Status: DC

## 2015-11-23 MED ORDER — BUPIVACAINE HCL (PF) 0.25 % IJ SOLN
INTRAMUSCULAR | Status: AC
  Filled 2015-11-23: qty 30

## 2015-11-23 MED ORDER — PANTOPRAZOLE SODIUM 40 MG PO TBEC
40.0000 mg | DELAYED_RELEASE_TABLET | Freq: Every day | ORAL | Status: DC
  Administered 2015-11-24 – 2015-11-25 (×2): 40 mg via ORAL
  Filled 2015-11-23 (×2): qty 1

## 2015-11-23 MED ORDER — MIDAZOLAM HCL 2 MG/2ML IJ SOLN
INTRAMUSCULAR | Status: AC
  Filled 2015-11-23: qty 2

## 2015-11-23 MED ORDER — HYDROMORPHONE HCL 1 MG/ML IJ SOLN
0.5000 mg | INTRAMUSCULAR | Status: DC | PRN
  Administered 2015-11-23 – 2015-11-24 (×2): 0.5 mg via INTRAVENOUS
  Filled 2015-11-23 (×2): qty 1

## 2015-11-23 MED ORDER — BUPIVACAINE LIPOSOME 1.3 % IJ SUSP
20.0000 mL | Freq: Once | INTRAMUSCULAR | Status: DC
  Filled 2015-11-23: qty 20

## 2015-11-23 MED ORDER — TRANEXAMIC ACID 1000 MG/10ML IV SOLN
2000.0000 mg | Freq: Once | INTRAVENOUS | Status: AC
  Administered 2015-11-23: 2000 mg via TOPICAL
  Filled 2015-11-23: qty 20

## 2015-11-23 MED ORDER — LEVOTHYROXINE SODIUM 125 MCG PO TABS
125.0000 ug | ORAL_TABLET | Freq: Every day | ORAL | Status: DC
  Administered 2015-11-24 – 2015-11-25 (×2): 125 ug via ORAL
  Filled 2015-11-23 (×3): qty 1

## 2015-11-23 MED ORDER — ACETAMINOPHEN 325 MG PO TABS: 650.0000 mg | ORAL_TABLET | Freq: Four times a day (QID) | ORAL | Status: DC | PRN

## 2015-11-23 MED ORDER — ONDANSETRON HCL 4 MG/2ML IJ SOLN
INTRAMUSCULAR | Status: DC | PRN
  Administered 2015-11-23: 4 mg via INTRAVENOUS

## 2015-11-23 MED ORDER — ACETAMINOPHEN 10 MG/ML IV SOLN
INTRAVENOUS | Status: AC
Start: 2015-11-23 — End: 2015-11-23
  Filled 2015-11-23: qty 100

## 2015-11-23 MED ORDER — SODIUM CHLORIDE 0.9 % IR SOLN
Status: DC | PRN
  Administered 2015-11-23: 1000 mL

## 2015-11-23 MED ORDER — MIDAZOLAM HCL 5 MG/5ML IJ SOLN
INTRAMUSCULAR | Status: DC | PRN
  Administered 2015-11-23: 2 mg via INTRAVENOUS

## 2015-11-23 MED ORDER — BUPIVACAINE HCL (PF) 0.75 % IJ SOLN
INTRAMUSCULAR | Status: DC | PRN
  Administered 2015-11-23: 15 mg via INTRATHECAL

## 2015-11-23 MED ORDER — HYDROMORPHONE HCL 2 MG PO TABS
2.0000 mg | ORAL_TABLET | ORAL | Status: DC | PRN
  Administered 2015-11-24: 4 mg via ORAL
  Administered 2015-11-24: 2 mg via ORAL
  Administered 2015-11-24 – 2015-11-25 (×4): 4 mg via ORAL
  Filled 2015-11-23: qty 1
  Filled 2015-11-23 (×5): qty 2

## 2015-11-23 MED ORDER — ONDANSETRON HCL 4 MG/2ML IJ SOLN: 4.0000 mg | Freq: Four times a day (QID) | INTRAMUSCULAR | Status: DC | PRN

## 2015-11-23 MED ORDER — PROPOFOL 500 MG/50ML IV EMUL
INTRAVENOUS | Status: DC | PRN
  Administered 2015-11-23: 100 ug/kg/min via INTRAVENOUS

## 2015-11-23 MED ORDER — CEFAZOLIN SODIUM-DEXTROSE 2-4 GM/100ML-% IV SOLN
INTRAVENOUS | Status: AC
  Filled 2015-11-23: qty 100

## 2015-11-23 MED ORDER — FLEET ENEMA 7-19 GM/118ML RE ENEM: 1.0000 | ENEMA | Freq: Once | RECTAL | Status: DC | PRN

## 2015-11-23 MED ORDER — PHENOL 1.4 % MT LIQD: 1.0000 | OROMUCOSAL | Status: DC | PRN

## 2015-11-23 MED ORDER — DIPHENHYDRAMINE HCL 12.5 MG/5ML PO ELIX: 12.5000 mg | ORAL_SOLUTION | ORAL | Status: DC | PRN

## 2015-11-23 MED ORDER — SODIUM CHLORIDE 0.9 % IJ SOLN
INTRAMUSCULAR | Status: AC
  Filled 2015-11-23: qty 50

## 2015-11-23 MED ORDER — ESCITALOPRAM OXALATE 10 MG PO TABS
10.0000 mg | ORAL_TABLET | Freq: Every day | ORAL | Status: DC
  Administered 2015-11-23 – 2015-11-24 (×2): 10 mg via ORAL
  Filled 2015-11-23 (×3): qty 1

## 2015-11-23 MED ORDER — DEXAMETHASONE SODIUM PHOSPHATE 10 MG/ML IJ SOLN
INTRAMUSCULAR | Status: DC | PRN
  Administered 2015-11-23: 10 mg via INTRAVENOUS

## 2015-11-23 MED ORDER — FENTANYL CITRATE (PF) 100 MCG/2ML IJ SOLN
INTRAMUSCULAR | Status: DC | PRN
  Administered 2015-11-23: 100 ug via INTRAVENOUS

## 2015-11-23 MED ORDER — SODIUM CHLORIDE 0.9 % IJ SOLN
INTRAMUSCULAR | Status: DC | PRN
  Administered 2015-11-23: 20 mL

## 2015-11-23 MED ORDER — CEFAZOLIN SODIUM-DEXTROSE 2-4 GM/100ML-% IV SOLN
2.0000 g | INTRAVENOUS | Status: AC
  Administered 2015-11-23: 2 g via INTRAVENOUS
  Filled 2015-11-23: qty 100

## 2015-11-23 MED ORDER — METHOCARBAMOL 1000 MG/10ML IJ SOLN
500.0000 mg | Freq: Four times a day (QID) | INTRAVENOUS | Status: DC | PRN
  Administered 2015-11-23: 500 mg via INTRAVENOUS
  Filled 2015-11-23: qty 550
  Filled 2015-11-23: qty 5

## 2015-11-23 SURGICAL SUPPLY — 63 items
BAG DECANTER FOR FLEXI CONT (MISCELLANEOUS) ×2 IMPLANT
BAG SPEC THK2 15X12 ZIP CLS (MISCELLANEOUS) ×1
BAG ZIPLOCK 12X15 (MISCELLANEOUS) ×2 IMPLANT
BANDAGE ACE 6X5 VEL STRL LF (GAUZE/BANDAGES/DRESSINGS) ×2 IMPLANT
BLADE SAG 18X100X1.27 (BLADE) ×2 IMPLANT
BLADE SAW SGTL 11.0X1.19X90.0M (BLADE) ×2 IMPLANT
BONE CEMENT GENTAMICIN (Cement) ×6 IMPLANT
BOWL SMART MIX CTS (DISPOSABLE) ×2 IMPLANT
CEMENT BONE GENTAMICIN 40 (Cement) IMPLANT
CEMENT RESTRICTOR DEPUY SZ 4 (Cement) ×1 IMPLANT
CLOTH BEACON ORANGE TIMEOUT ST (SAFETY) ×2 IMPLANT
CUFF TOURN SGL QUICK 34 (TOURNIQUET CUFF) ×2
CUFF TRNQT CYL 34X4X40X1 (TOURNIQUET CUFF) ×1 IMPLANT
DECANTER SPIKE VIAL GLASS SM (MISCELLANEOUS) ×2 IMPLANT
DRAPE U-SHAPE 47X51 STRL (DRAPES) ×2 IMPLANT
DRSG ADAPTIC 3X8 NADH LF (GAUZE/BANDAGES/DRESSINGS) ×2 IMPLANT
DRSG PAD ABDOMINAL 8X10 ST (GAUZE/BANDAGES/DRESSINGS) ×2 IMPLANT
DURAPREP 26ML APPLICATOR (WOUND CARE) ×2 IMPLANT
ELECT REM PT RETURN 9FT ADLT (ELECTROSURGICAL) ×2
ELECTRODE REM PT RTRN 9FT ADLT (ELECTROSURGICAL) ×1 IMPLANT
EVACUATOR 1/8 PVC DRAIN (DRAIN) ×2 IMPLANT
FEMUR  HINGE RT X SMALL (Orthopedic Implant) ×1 IMPLANT
FEMUR HINGE RT X SMALL (Orthopedic Implant) ×1 IMPLANT
FEMUR HINGED RT X SMALL (Orthopedic Implant) IMPLANT
GAUZE SPONGE 4X4 12PLY STRL (GAUZE/BANDAGES/DRESSINGS) ×2 IMPLANT
GLOVE BIO SURGEON STRL SZ7.5 (GLOVE) IMPLANT
GLOVE BIO SURGEON STRL SZ8 (GLOVE) ×2 IMPLANT
GLOVE BIOGEL PI IND STRL 6.5 (GLOVE) IMPLANT
GLOVE BIOGEL PI IND STRL 8 (GLOVE) ×1 IMPLANT
GLOVE BIOGEL PI INDICATOR 6.5 (GLOVE)
GLOVE BIOGEL PI INDICATOR 8 (GLOVE) ×1
GLOVE SURG SS PI 6.5 STRL IVOR (GLOVE) IMPLANT
GOWN STRL REUS W/TWL LRG LVL3 (GOWN DISPOSABLE) ×2 IMPLANT
GOWN STRL REUS W/TWL XL LVL3 (GOWN DISPOSABLE) IMPLANT
HANDPIECE INTERPULSE COAX TIP (DISPOSABLE) ×2
IMMOBILIZER KNEE 20 (SOFTGOODS) ×2
IMMOBILIZER KNEE 20 THIGH 36 (SOFTGOODS) ×1 IMPLANT
INSERT HINGE UNIV XSM KNEE (Insert) ×1 IMPLANT
MANIFOLD NEPTUNE II (INSTRUMENTS) ×2 IMPLANT
NS IRRIG 1000ML POUR BTL (IV SOLUTION) ×2 IMPLANT
PACK TOTAL KNEE CUSTOM (KITS) ×2 IMPLANT
PADDING CAST COTTON 6X4 STRL (CAST SUPPLIES) ×4 IMPLANT
PATELLA DOME PFC 35MM (Knees) ×1 IMPLANT
POSITIONER SURGICAL ARM (MISCELLANEOUS) ×2 IMPLANT
SET HNDPC FAN SPRY TIP SCT (DISPOSABLE) ×1 IMPLANT
SLEEVE TIB MBT 27/45 40 (Knees) ×1 IMPLANT
SLEEVE UNIV FRM 40MM (Knees) ×1 IMPLANT
STAPLER VISISTAT 35W (STAPLE) ×1 IMPLANT
STEM TIBIA PFC 13X60MM (Stem) ×1 IMPLANT
STEM UNIVERSAL REVISION 75X16 (Stem) ×1 IMPLANT
STRIP CLOSURE SKIN 1/2X4 (GAUZE/BANDAGES/DRESSINGS) ×3 IMPLANT
SUT MNCRL AB 4-0 PS2 18 (SUTURE) ×2 IMPLANT
SUT PDS AB 1 CT1 27 (SUTURE) ×1 IMPLANT
SUT VIC AB 2-0 CT1 27 (SUTURE) ×6
SUT VIC AB 2-0 CT1 TAPERPNT 27 (SUTURE) ×3 IMPLANT
SUT VLOC 180 0 24IN GS25 (SUTURE) ×2 IMPLANT
SYR 50ML LL SCALE MARK (SYRINGE) ×2 IMPLANT
TRAY FOLEY W/METER SILVER 14FR (SET/KITS/TRAYS/PACK) ×2 IMPLANT
TRAY FOLEY W/METER SILVER 16FR (SET/KITS/TRAYS/PACK) ×2 IMPLANT
TRAY REVISION SZ 3 15MM (Joint) ×1 IMPLANT
WATER STERILE IRR 1500ML POUR (IV SOLUTION) ×2 IMPLANT
WRAP KNEE MAXI GEL POST OP (GAUZE/BANDAGES/DRESSINGS) ×2 IMPLANT
YANKAUER SUCT BULB TIP 10FT TU (MISCELLANEOUS) ×2 IMPLANT

## 2015-11-23 NOTE — Brief Op Note (Signed)
11/23/2015  5:17 PM  PATIENT:  Andrea Norton  70 y.o. female  PRE-OPERATIVE DIAGNOSIS:  RESECTION ARTHROPLASTY RIGHT KNEE  POST-OPERATIVE DIAGNOSIS:  RESECTION ARTHROPLASTY RIGHT KNEE  PROCEDURE:  Procedure(s): RIGHT TOTAL KNEE ARTHROPLASTY REIMPLANTATION (Right)  SURGEON:  Surgeon(s) and Role:    * Ollen GrossFrank Zander Ingham, MD - Primary  PHYSICIAN ASSISTANT:   ASSISTANTS: Avel Peacerew Perkins, PA-C   ANESTHESIA:   spinal  EBL:  Total I/O In: 1000 [I.V.:1000] Out: 750 [Urine:700; Blood:50]  BLOOD ADMINISTERED:none  DRAINS: (Medium) Hemovact drain(s) in the right knee with  Suction Open   LOCAL MEDICATIONS USED:  OTHER Exparel  COUNTS:  YES  TOURNIQUET:   Total Tourniquet Time Documented: Thigh (Right) - 68 minutes Total: Thigh (Right) - 68 minutes   DICTATION: .Other Dictation: Dictation Number [id number]  PLAN OF CARE: Admit to inpatient   PATIENT DISPOSITION:  PACU - hemodynamically stable.

## 2015-11-23 NOTE — H&P (Signed)
Andrea Norton is an 70 y.o. female.   Chief Complaint: Right knee resection arthroplasty HPI: 70 yo female with long history of very complex problems with her right knee. 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 Clinically the knee has improved dramatically and she now has a normal c-reactive protein and sed rate. She presents now for TKA reimplantation.  Past Medical History  Diagnosis Date  . Hyperlipidemia   . Status post primary angioplasty with coronary stent     DRUG-ELUTING STENT X1  TO PROXIMAL LAD  . Chronic back pain   . Chronic neck pain   . Arthritis     HANDS, KNEES  . DDD (degenerative disc disease), cervical   . DDD (degenerative disc disease), lumbar   . Radicular pain in left arm OCCASIONAL PAIN SHOOTING DOWN LEFT ARM SECAONDARY TO  CERVICAL DEGENERATION  . Hemorrhoid   . Diverticulitis of colon   . B12 deficiency anemia   . Vitamin D deficiency   . Hypothyroidism   . Synovitis of knee HYPERTROPHIC RIGHT KNEE  . Abnormal stress test 01-22-2009    LOW RISK ADENOSINE NUCLEAR STUDY W/ PROBABLE MILD APICAL THINNING BUT NO ISCHEMIA  . Depression     states well controlled  . GERD (gastroesophageal reflux disease)   . Inguinal hernia, right     with ventral hernia  . Hemorrhoids   . Coronary artery disease 2005    STRESS TEST /, NOte Dr Kirtland Bouchard EPIC/ no cardiologist now--STATES HEART STENT PLACED 2010  . Bronchitis     hx of   . H/O hiatal hernia   . Pneumonia     hx of walking pneumonia  . Hypertension       PCP Dr Lysbeth Galas; pt currently on no medication for treatment   . Sleep apnea     couldnt tolerate machine- states last sleep study " many years ago"  . Diabetes mellitus without complication (HCC)     pt states since having gastric banding does not have take medication was on metformin in past  . History of urinary tract infection   . Urinary incontinence     Past  Surgical History  Procedure Laterality Date  . Knee closed reduction  05-07-2009  RIGHT KNEE    LEFT KNEE  10-01-2009  . Total knee arthroplasty  LEFT  2005    RIGHT  03-13-2009  . Total knee revision  RIGHT 09-30-2009    LEFT 11-25-2009  . Hysteroscopy w/d&c  2002  . Open patellofemoral right knee/ lateral release  05-04-2009  . Knee arthroscopy w/ debridement  01-22-2010    SEPTIC KNEE  . Right knee i & d polythylene revision  02-04-2010    KNEE INFECTED  . Left foot surg.  1990  . Appendectomy  1993  . Knee arthroscopy  LEFT 1999    RIGHT 2004  . Hemicolectomy for diverticulitis  2005  . Knee arthroscopy  08/03/2011    Procedure: ARTHROSCOPY KNEE;  Surgeon: Loanne Drilling;  Location: [facility];  Service: Orthopedics;  Laterality: Right;  . Synovectomy  08/03/2011    Procedure: SYNOVECTOMY;  Surgeon: Loanne Drilling;  Location:  SURGERY CENTER;  Service: Orthopedics;  Laterality: Right;  . I&d knee with poly exchange  02/01/2012    Procedure: IRRIGATION AND DEBRIDEMENT KNEE WITH POLY EXCHANGE;  Surgeon: Loanne Drilling, MD;  Location: [facility];  Service: Orthopedics;  Laterality:  Right;  Right Knee Polyethlene Revision   . Colon surgery    . Total knee revision Right 02/20/2013    Procedure: RIGHT TOTAL KNEE ARTHROPLASTY REVISION VERSUS RESECTION ARTHROPLASTY;  Surgeon: Loanne DrillingFrank V Sharalee Witman, MD;  Location: [facility];  Service: Orthopedics;  Laterality: Right;  . Joint replacement      right knee   . Coronary angioplasty  2004    DRUG-ELUTING STENT X1 TO PROXIMAL  LAD  . Cataract extraction w/phaco Right 05/06/2014    Procedure: CATARACT EXTRACTION PHACO AND INTRAOCULAR LENS PLACEMENT (IOC);  Surgeon: Loraine LericheMark T. Nile RiggsShapiro, MD;  Location: [facility];  Service: Ophthalmology;  Laterality: Right;  CDE 6.41  . Total knee revision Right 04/02/2014    Procedure: RIGHT KNEE FEMORAL ARTHROPLASTY REVISION;  Surgeon: Loanne DrillingFrank Phu Record V, MD;  Location: [facility];  Service: Orthopedics;   Laterality: Right;  . Cataract extraction w/phaco Left 05/20/2014    Procedure: CATARACT EXTRACTION PHACO AND INTRAOCULAR LENS PLACEMENT (IOC);  Surgeon: Loraine LericheMark T. Nile RiggsShapiro, MD;  Location: [facility];  Service: Ophthalmology;  Laterality: Left;  CDE:4.42  . Laparoscopic gastric sleeve resection N/A 07/21/2014    Procedure: LAPAROSCOPIC GASTRIC SLEEVE RESECTION, TAKEDOWN OF INCARCERATED VENTRAL HERNIA, ENTERAL LYSIS, UPPER ENDOSCOPY;  Surgeon: Valarie MerinoMatthew B Martin, MD;  Location: [facility];  Service: General;  Laterality: N/A;  . Knee arthroscopy Right 12/17/2014    Procedure: RIGHT ARTHROSCOPY KNEE, SYNOVECTOMY;  Surgeon: Ollen GrossFrank Aydan Levitz, MD;  Location: [facility];  Service: Orthopedics;  Laterality: Right;  . Irrigation and debridement knee Right 03/10/2015    Procedure: IRRIGATION AND DEBRIDEMENT of right KNEE;  Surgeon: Ollen GrossFrank Camdan Burdi, MD;  Location: [facility];  Service: Orthopedics;  Laterality: Right;  . Quadriceps tendon repair Right 06/02/2015    Procedure: RIGHT KNEE QUADRICEP TENDON REPAIR ;  Surgeon: Ollen GrossFrank Shamyia Grandpre, MD;  Location: [facility];  Service: Orthopedics;  Laterality: Right;  . Eye surgery      cataract surgery bilat   . Excisional total knee arthroplasty with antibiotic spacers Right 09/14/2015    Procedure: RIGHT TOTAL KNEE RESSECTION ARTHROPLASTY WITH ANTIBIOTIC SPACERS;  Surgeon: Ollen GrossFrank Jazier Mcglamery, MD;  Location: [facility];  Service: Orthopedics;  Laterality: Right;    Family History  Problem Relation Age of Onset  . COPD Mother   . Cancer Father     lung   Social History:  reports that she quit smoking about 13 years ago. Her smoking use included Cigarettes. She has a 30 pack-year smoking history. She has never used smokeless tobacco. She reports that she does not drink alcohol or use illicit drugs.  Allergies: No Known Allergies  Medications Prior to Admission  Medication Sig Dispense Refill  . aspirin EC 81 MG tablet Take 81 mg by mouth 2 (two) times daily.    Marland Kitchen. docusate sodium (COLACE) 100 MG  capsule Take 100 mg by mouth daily.    Marland Kitchen. escitalopram (LEXAPRO) 10 MG tablet Take 10 mg by mouth at bedtime.    . gabapentin (NEURONTIN) 100 MG capsule Take 300 mg by mouth 2 (two) times daily.     Marland Kitchen. HYDROmorphone (DILAUDID) 2 MG tablet Take 1-3 tablets (2-6 mg total) by mouth every 4 (four) hours as needed for moderate pain or severe pain. 120 tablet 0  . levothyroxine (SYNTHROID, LEVOTHROID) 125 MCG tablet Take 125 mcg by mouth daily before breakfast. Brand Name Only    . methocarbamol (ROBAXIN) 500 MG tablet Take 1 tablet (500 mg total) by mouth every 6 (six) hours as needed for muscle spasms. 90  tablet 0  . pantoprazole (PROTONIX) 40 MG tablet Take 40 mg by mouth daily with lunch.     Marland Kitchen PRESCRIPTION MEDICATION Apply 1 application topically 2 (two) times daily as needed (Itching). 3 in one cream. Ketoconazole Cream 2%, SSD cream 1%, and Triamcinolone Cream 0.1%. Compounded at [facility] in [place]    . simvastatin (ZOCOR) 40 MG tablet Take 40 mg by mouth daily.    . rivaroxaban (XARELTO) 10 MG TABS tablet Take 1 tablet (10 mg total) by mouth daily with breakfast. Take Xarelto for two and a half more weeks, then discontinue Xarelto. Once the patient has completed the Xarelto, they may resume the 81 mg Aspirin. (Patient not taking: Reported on 10/29/2015) 19 tablet 0    Results for orders placed or performed during the hospital encounter of 11/23/15 (from the past 48 hour(s))  Glucose, capillary     Status: None   Collection Time: 11/23/15 11:41 AM  Result Value Ref Range   Glucose-Capillary 97 65 - 99 mg/dL   Comment 1 Notify RN    Comment 2 Document in Chart    No results found.  ROS  Blood pressure 158/77, pulse 62, temperature 98 F (36.7 C), temperature source Oral, resp. rate 18, height 5\' 4"  (1.626 m), weight 100.245 kg (221 lb), SpO2 97 %. Physical Exam Physical Examination: General appearance - alert, well appearing, and in no distress Mental status - alert, oriented to person,  place, and time Chest - clear to auscultation, no wheezes, rales or rhonchi, symmetric air entry Heart - normal rate, regular rhythm, normal S1, S2, no murmurs, rubs, clicks or gallops Abdomen - soft, nontender, nondistended, no masses or organomegaly Neurological - alert, oriented, normal speech, no focal findings or movement disorder noted Right knee- incision well healed; no warmth or erythema; motor and sensation intact; DP pulse intact  Assessment/Plan Right knee resection arthroplasty- Plan reimplantation right knee. Discussed in detail with patient who elects to proceed.  Loanne Drilling, MD 11/23/2015, 3:02 PM

## 2015-11-23 NOTE — Transfer of Care (Signed)
Immediate Anesthesia Transfer of Care Note  Patient: Lear NgGloria O Nhem  Procedure(s) Performed: Procedure(s): RIGHT TOTAL KNEE ARTHROPLASTY REIMPLANTATION (Right)  Patient Location: PACU  Anesthesia Type:Spinal  Level of Consciousness: awake, alert  and oriented  Airway & Oxygen Therapy: Patient Spontanous Breathing and Patient connected to face mask oxygen  Post-op Assessment: Report given to RN and Post -op Vital signs reviewed and stable  Post vital signs: Reviewed and stable  Last Vitals:  Filed Vitals:   11/23/15 1147  BP: 158/77  Pulse: 62  Temp: 36.7 C  Resp: 18    Last Pain: There were no vitals filed for this visit.    Patients Stated Pain Goal: 4 (11/23/15 1422)  Complications: No apparent anesthesia complications

## 2015-11-23 NOTE — H&P (View-Only) (Signed)
Preoperative surgical orders have been place into the Epic hospital system for Andrea Norton on 11/17/2015, 10:04 AM  by Patrica DuelPERKINS, Kevina Piloto for surgery on 11-23-2015.  Preop Total Knee orders including Experal, IV Tylenol, and IV Decadron as long as there are no contraindications to the above medications. Avel Peacerew Shaquitta Burbridge, PA-C

## 2015-11-23 NOTE — Anesthesia Preprocedure Evaluation (Addendum)
Anesthesia Evaluation  Patient identified by MRN, date of birth, ID band Patient awake    Reviewed: Allergy & Precautions, NPO status , Patient's Chart, lab work & pertinent test results  History of Anesthesia Complications Negative for: history of anesthetic complications  Airway Mallampati: II  TM Distance: >3 FB Neck ROM: Full    Dental  (+) Teeth Intact, Dental Advisory Given   Pulmonary sleep apnea (does not use CPAP) , COPD, former smoker (quit 2004),    breath sounds clear to auscultation       Cardiovascular hypertension (no meds presently), (-) angina+ CAD and + Cardiac Stents (DES in LAD)   Rhythm:Regular Rate:Normal     Neuro/Psych DDD with chronic back pain: narcotics    GI/Hepatic Neg liver ROS, GERD  Medicated and Controlled,S/p gastric banding, gastric sleeve   Endo/Other  diabetes (off meds now)Hypothyroidism Morbid obesity  Renal/GU negative Renal ROS     Musculoskeletal   Abdominal (+) + obese,   Peds  Hematology negative hematology ROS (+)   Anesthesia Other Findings   Reproductive/Obstetrics                            Anesthesia Physical Anesthesia Plan  ASA: III  Anesthesia Plan: Spinal   Post-op Pain Management:    Induction:   Airway Management Planned: Natural Airway and Simple Face Mask  Additional Equipment:   Intra-op Plan:   Post-operative Plan:   Informed Consent: I have reviewed the patients History and Physical, chart, labs and discussed the procedure including the risks, benefits and alternatives for the proposed anesthesia with the patient or authorized representative who has indicated his/her understanding and acceptance.   Dental advisory given  Plan Discussed with: Surgeon and CRNA  Anesthesia Plan Comments: (Plan routine monitors, SAB)        Anesthesia Quick Evaluation

## 2015-11-23 NOTE — Interval H&P Note (Signed)
History and Physical Interval Note:  11/23/2015 2:00 PM  Andrea Norton  has presented today for surgery, with the diagnosis of RESECTION ARTHROPLASTY RIGHT KNEE  The various methods of treatment have been discussed with the patient and family. After consideration of risks, benefits and other options for treatment, the patient has consented to  Procedure(s): RIGHT TOTAL KNEE ARTHROPLASTY REIMPLANTATION (Right) as a surgical intervention .  The patient's history has been reviewed, patient examined, no change in status, stable for surgery.  I have reviewed the patient's chart and labs.  Questions were answered to the patient's satisfaction.     Loanne DrillingALUISIO,Tomie Spizzirri V

## 2015-11-23 NOTE — Anesthesia Procedure Notes (Signed)
Spinal Patient location during procedure: OR Start time: 11/23/2015 3:28 PM End time: 11/23/2015 3:30 PM Staffing Resident/CRNA: Harle Stanford R Performed by: resident/CRNA  Preanesthetic Checklist Completed: patient identified, site marked, surgical consent, pre-op evaluation, timeout performed, IV checked, risks and benefits discussed and monitors and equipment checked Spinal Block Patient position: sitting Prep: Betadine Patient monitoring: heart rate, cardiac monitor, continuous pulse ox and blood pressure Approach: midline Location: L3-4 Injection technique: single-shot Needle Needle type: Sprotte  Needle gauge: 24 G Needle length: 10 cm Needle insertion depth: 8 cm Assessment Sensory level: T6 Additional Notes Timeout performed. Spinal kit date checked. SAB without difficulty

## 2015-11-23 NOTE — Anesthesia Postprocedure Evaluation (Signed)
Anesthesia Post Note  Patient: Andrea Norton  Procedure(s) Performed: Procedure(s) (LRB): RIGHT TOTAL KNEE ARTHROPLASTY REIMPLANTATION (Right)  Patient location during evaluation: PACU Anesthesia Type: Spinal Level of consciousness: oriented, patient cooperative and awake and alert Pain management: pain level controlled Vital Signs Assessment: post-procedure vital signs reviewed and stable Respiratory status: spontaneous breathing, respiratory function stable and nonlabored ventilation Cardiovascular status: blood pressure returned to baseline Postop Assessment: no headache, spinal receding, patient able to bend at knees and no signs of nausea or vomiting Anesthetic complications: no    Last Vitals:  Filed Vitals:   11/23/15 1815 11/23/15 1830  BP:  123/74  Pulse:  64  Temp: 36.4 C 36.4 C  Resp:  16    Last Pain: There were no vitals filed for this visit.               Germaine PomfretJACKSON,E. Kenya Kook

## 2015-11-24 ENCOUNTER — Encounter (HOSPITAL_COMMUNITY): Payer: Self-pay | Admitting: Orthopedic Surgery

## 2015-11-24 LAB — BASIC METABOLIC PANEL
ANION GAP: 9 (ref 5–15)
BUN: 15 mg/dL (ref 6–20)
CHLORIDE: 108 mmol/L (ref 101–111)
CO2: 23 mmol/L (ref 22–32)
CREATININE: 0.74 mg/dL (ref 0.44–1.00)
Calcium: 8.8 mg/dL — ABNORMAL LOW (ref 8.9–10.3)
GFR calc non Af Amer: >60 mL/min (ref >60)
Glucose, Bld: 186 mg/dL — ABNORMAL HIGH (ref 65–99)
POTASSIUM: 4.5 mmol/L (ref 3.5–5.1)
SODIUM: 140 mmol/L (ref 135–145)

## 2015-11-24 LAB — CBC
HCT: 36.4 % (ref 36.0–46.0)
Hemoglobin: 11.8 g/dL — ABNORMAL LOW (ref 12.0–15.0)
MCH: 26.9 pg (ref 26.0–34.0)
MCHC: 32.4 g/dL (ref 30.0–36.0)
MCV: 83.1 fL (ref 78.0–100.0)
Platelets: 243 10*3/uL (ref 150–400)
RBC: 4.38 MIL/uL (ref 3.87–5.11)
RDW: 13.9 % (ref 11.5–15.5)
WBC: 9.4 10*3/uL (ref 4.0–10.5)

## 2015-11-24 MED ORDER — HYDROMORPHONE HCL 2 MG PO TABS: 2.0000 mg | ORAL_TABLET | ORAL | Status: DC | PRN

## 2015-11-24 MED ORDER — RIVAROXABAN 10 MG PO TABS
10.0000 mg | ORAL_TABLET | Freq: Every day | ORAL | Status: DC
Start: 2015-11-24 — End: 2015-11-25

## 2015-11-24 MED ORDER — METHOCARBAMOL 500 MG PO TABS: 500.0000 mg | ORAL_TABLET | Freq: Four times a day (QID) | ORAL | Status: DC | PRN

## 2015-11-24 NOTE — Progress Notes (Signed)
[facility]   Patient was interested in a beside commode.  Her insurance covered a commode 04/03/2014.  She is not eligible at this time for another one.    If patient discharges after hours, please call [phone].   Renard HamperLecretia Williamson 11/24/2015, 1:44 PM

## 2015-11-24 NOTE — Evaluation (Signed)
Physical Therapy Evaluation Patient Details Name: Andrea Norton MRN: [id number] DOB: 1946/04/03 Today's Date: 11/24/2015   History of Present Illness  70 yo female s/p R TKA reimplantation 11/23/15.   Clinical Impression  On eval, pt required Min assist for mobility-walked ~60 feet with RW. Pain rated 5/10 with activity. Will follow and progress activity as tolerated.     Follow Up Recommendations Home health PT    Equipment Recommendations  None recommended by PT    Recommendations for Other Services       Precautions / Restrictions Precautions Precautions: Fall;Knee Required Braces or Orthoses: Knee Immobilizer - Left Knee Immobilizer - Left: Discontinue once straight leg raise with < 10 degree lag Restrictions Weight Bearing Restrictions: No RLE Weight Bearing: Weight bearing as tolerated Other Position/Activity Restrictions: WBAT      Mobility  Bed Mobility Overal bed mobility: Needs Assistance Bed Mobility: Supine to Sit     Supine to sit: Min assist     General bed mobility comments: Assist for R LE  Transfers Overall transfer level: Needs assistance Equipment used: Rolling walker (2 wheeled) Transfers: Sit to/from Stand Sit to Stand: Min assist         General transfer comment: close guard for safety. VCs safety, hand placement  Ambulation/Gait Ambulation/Gait assistance: Min guard Ambulation Distance (Feet): 60 Feet Assistive device: Rolling walker (2 wheeled) Gait Pattern/deviations: Step-to pattern;Trunk flexed     General Gait Details: VCs safety, sequence, posture. close guard for safety.   Stairs            Wheelchair Mobility    Modified Rankin (Stroke Patients Only)       Balance                                             Pertinent Vitals/Pain Pain Assessment: 0-10 Pain Score: 5  Pain Location: R knee Pain Descriptors / Indicators: Sore Pain Intervention(s): Monitored during session;Ice  applied;Repositioned    Home Living Family/patient expects to be discharged to:: Private residence Living Arrangements: Spouse/significant other Available Help at Discharge: Family Type of Home: Mobile home Home Access: Ramped entrance     Home Layout: One level Home Equipment: Environmental consultant - 2 wheels;Walker - 4 wheels;Wheelchair - manual;Cane - single point;Bedside commode Additional Comments: uses 3 in 1 in shower too    Prior Function Level of Independence: Needs assistance   Gait / Transfers Assistance Needed: pivoting to wheelchair prior to this admission (due to spacer)  ADL's / Homemaking Assistance Needed: husband assisted with bathing, dressing, shower transfer prior to admission        Hand Dominance        Extremity/Trunk Assessment   Upper Extremity Assessment: Defer to OT evaluation           Lower Extremity Assessment: RLE deficits/detail RLE Deficits / Details: SLR 2/5. moves ankle well.     Cervical / Trunk Assessment: Normal  Communication   Communication: No difficulties  Cognition Arousal/Alertness: Awake/alert Behavior During Therapy: WFL for tasks assessed/performed Overall Cognitive Status: Within Functional Limits for tasks assessed                      General Comments      Exercises Total Joint Exercises Ankle Circles/Pumps: AROM;Both;10 reps;Supine Quad Sets: AROM;Both;10 reps;Supine Heel Slides: AAROM;Right;10 reps;Supine Hip ABduction/ADduction: AAROM;Right;10 reps;Supine Straight Leg Raises: AAROM;Right;10  reps;Supine Goniometric ROM: ~10-60 degrees      Assessment/Plan    PT Assessment Patient needs continued PT services  PT Diagnosis Difficulty walking;Acute pain   PT Problem List Decreased strength;Decreased range of motion;Decreased activity tolerance;Decreased balance;Decreased mobility;Decreased knowledge of use of DME;Pain  PT Treatment Interventions DME instruction;Gait training;Stair training;Functional  mobility training;Therapeutic activities;Patient/family education;Balance training;Therapeutic exercise   PT Goals (Current goals can be found in the Care Plan section) Acute Rehab PT Goals Patient Stated Goal: to walk again PT Goal Formulation: With patient/family Time For Goal Achievement: 12/01/15 Potential to Achieve Goals: Good    Frequency 7X/week   Barriers to discharge        Co-evaluation               End of Session Equipment Utilized During Treatment: Gait belt Activity Tolerance: Patient tolerated treatment well Patient left:  (hand-off to OT)           Time: 1610-9604: 0856-0914 PT Time Calculation (min) (ACUTE ONLY): 18 min   Charges:   PT Evaluation $PT Eval Low Complexity: 1 Procedure     PT G Codes:        Rebeca AlertJannie Averey Trompeter, MPT Pager: [phone]

## 2015-11-24 NOTE — Discharge Summary (Signed)
Physician Discharge Summary   Patient ID: Andrea Norton MRN: [id number] DOB/AGE: [birth year] 68 y.o.  Admit date: 11/23/2015 Discharge date: 11-25-2015  Primary Diagnosis:  Resection arthroplasty, right knee.  Admission Diagnoses:  Past Medical History  Diagnosis Date  . Hyperlipidemia   . Status post primary angioplasty with coronary stent     DRUG-ELUTING STENT X1  TO PROXIMAL LAD  . Chronic back pain   . Chronic neck pain   . Arthritis     HANDS, KNEES  . DDD (degenerative disc disease), cervical   . DDD (degenerative disc disease), lumbar   . Radicular pain in left arm OCCASIONAL PAIN SHOOTING DOWN LEFT ARM SECAONDARY TO  CERVICAL DEGENERATION  . Hemorrhoid   . Diverticulitis of colon   . B12 deficiency anemia   . Vitamin D deficiency   . Hypothyroidism   . Synovitis of knee HYPERTROPHIC RIGHT KNEE  . Abnormal stress test 01-22-2009    LOW RISK ADENOSINE NUCLEAR STUDY W/ PROBABLE MILD APICAL THINNING BUT NO ISCHEMIA  . Depression     states well controlled  . GERD (gastroesophageal reflux disease)   . Inguinal hernia, right     with ventral hernia  . Hemorrhoids   . Coronary artery disease 2005    STRESS TEST /, NOte Dr Jenkins [place] EPIC/ no cardiologist now--STATES [place] 2010  . Bronchitis     hx of   . H/O hiatal hernia   . Pneumonia     hx of walking pneumonia  . Hypertension       PCP Dr Edrick Oh; pt currently on no medication for treatment   . Sleep apnea     couldnt tolerate machine- states last sleep study " many years ago"  . Diabetes mellitus without complication ([place])     pt states since having gastric banding does not have take medication was on metformin in past  . History of urinary tract infection   . Urinary incontinence    Discharge Diagnoses:   Principal Problem:   Septic arthritis of knee, right (HCC) Active Problems:   OA (osteoarthritis) of knee  Estimated body mass index is 37.92 kg/(m^2) as calculated from the following:  Height as of this encounter: 5' 4"  (1.626 m).   Weight as of this encounter: 100.245 kg (221 lb).  Procedure:  Procedure(s) (LRB): RIGHT TOTAL KNEE ARTHROPLASTY REIMPLANTATION (Right)   Consults: None  HPI: Andrea Norton is a 70 year old female, with very long complex history in regard to her right knee. She has had multiple operations in the past. She had resection arthroplasty for infection done approximately 2-1/2 months ago. She had 6 weeks of postoperative antibiotics. All blood work came back normal. Clinically, the knee had improved dramatically. She presents now for total knee arthroplasty and Reimplantation.  Laboratory Data: Admission on 11/23/2015  Component Date Value Ref Range Status  . Glucose-Capillary 11/23/2015 97  65 - 99 mg/dL Final  . Comment 1 11/23/2015 Notify RN   Final  . Comment 2 11/23/2015 Document in Chart   Final  . Specimen Description 11/23/2015 SYNOVIAL FLUID RIGHT KNEE   Final  . Special Requests 11/23/2015 FLUID ON SWAB   Final  . Gram Stain 11/23/2015 PENDING   Incomplete  . Culture 11/23/2015 PENDING   Incomplete  . Report Status 11/23/2015 PENDING   Incomplete  . Specimen Description 11/23/2015 SYNOVIAL FLUID RIGHT KNEE   Final  . Special Requests 11/23/2015 FLUID ON SWAB   Final  . Gram Stain 11/23/2015  Final                   Value:NO ORGANISMS SEEN RARE WBC PRESENT, PREDOMINANTLY PMN Gram Stain Report Called to,Read Back By and Verified With: M.NANNEY RN AT 0092 ON 11/23/15 BY S.VANHOORNE   . Culture 11/23/2015    Final                   Value:NO GROWTH < 24 HOURS Performed at [facility]   . Report Status 11/23/2015 PENDING   Incomplete  . WBC 11/24/2015 9.4  4.0 - 10.5 K/uL Final  . RBC 11/24/2015 4.38  3.87 - 5.11 MIL/uL Final  . Hemoglobin 11/24/2015 11.8* 12.0 - 15.0 g/dL Final  . HCT 11/24/2015 36.4  36.0 - 46.0 % Final  . MCV 11/24/2015 83.1  78.0 - 100.0 fL Final  . MCH 11/24/2015 26.9  26.0 - 34.0 pg Final  .  MCHC 11/24/2015 32.4  30.0 - 36.0 g/dL Final  . RDW 11/24/2015 13.9  11.5 - 15.5 % Final  . Platelets 11/24/2015 243  150 - 400 K/uL Final  . Sodium 11/24/2015 140  135 - 145 mmol/L Final  . Potassium 11/24/2015 4.5  3.5 - 5.1 mmol/L Final  . Chloride 11/24/2015 108  101 - 111 mmol/L Final  . CO2 11/24/2015 23  22 - 32 mmol/L Final  . Glucose, Bld 11/24/2015 186* 65 - 99 mg/dL Final  . BUN 11/24/2015 15  6 - 20 mg/dL Final  . Creatinine, Ser 11/24/2015 0.74  0.44 - 1.00 mg/dL Final  . Calcium 11/24/2015 8.8* 8.9 - 10.3 mg/dL Final  . GFR calc non Af Amer 11/24/2015 >60  >60 mL/min Final  . GFR calc Af Amer 11/24/2015 >60  >60 mL/min Final   Comment: (NOTE) The eGFR has been calculated using the CKD EPI equation. This calculation has not been validated in all clinical situations. eGFR's persistently <60 mL/min signify possible Chronic Kidney Disease.   . Anion gap 11/24/2015 9  5 - 15 Final  . WBC 11/25/2015 9.7  4.0 - 10.5 K/uL Final  . RBC 11/25/2015 4.06  3.87 - 5.11 MIL/uL Final  . Hemoglobin 11/25/2015 11.1* 12.0 - 15.0 g/dL Final  . HCT 11/25/2015 33.8* 36.0 - 46.0 % Final  . MCV 11/25/2015 83.3  78.0 - 100.0 fL Final  . MCH 11/25/2015 27.3  26.0 - 34.0 pg Final  . MCHC 11/25/2015 32.8  30.0 - 36.0 g/dL Final  . RDW 11/25/2015 14.2  11.5 - 15.5 % Final  . Platelets 11/25/2015 228  150 - 400 K/uL Final  . Sodium 11/25/2015 142  135 - 145 mmol/L Final  . Potassium 11/25/2015 4.5  3.5 - 5.1 mmol/L Final  . Chloride 11/25/2015 110  101 - 111 mmol/L Final  . CO2 11/25/2015 27  22 - 32 mmol/L Final  . Glucose, Bld 11/25/2015 122* 65 - 99 mg/dL Final  . BUN 11/25/2015 9  6 - 20 mg/dL Final  . Creatinine, Ser 11/25/2015 0.57  0.44 - 1.00 mg/dL Final  . Calcium 11/25/2015 8.8* 8.9 - 10.3 mg/dL Final  . GFR calc non Af Amer 11/25/2015 >60  >60 mL/min Final  . GFR calc Af Amer 11/25/2015 >60  >60 mL/min Final   Comment: (NOTE) The eGFR has been calculated using the CKD EPI  equation. This calculation has not been validated in all clinical situations. eGFR's persistently <60 mL/min signify possible Chronic Kidney Disease.   . Anion gap 11/25/2015 5  5 - 15 Final  Hospital Outpatient Visit on 11/17/2015  Component Date Value Ref Range Status  . MRSA, PCR 11/17/2015 NEGATIVE  NEGATIVE Final  . Staphylococcus aureus 11/17/2015 NEGATIVE  NEGATIVE Final   Comment:        The Xpert SA Assay (FDA approved for NASAL specimens in patients over 67 years of age), is one component of a comprehensive surveillance program.  Test performance has been validated by [facility] for patients greater than or equal to 46 year old. It is not intended to diagnose infection nor to guide or monitor treatment.   Marland Kitchen aPTT 11/17/2015 29  24 - 37 seconds Final  . WBC 11/17/2015 6.3  4.0 - 10.5 K/uL Final  . RBC 11/17/2015 4.88  3.87 - 5.11 MIL/uL Final  . Hemoglobin 11/17/2015 13.1  12.0 - 15.0 g/dL Final  . HCT 11/17/2015 40.1  36.0 - 46.0 % Final  . MCV 11/17/2015 82.2  78.0 - 100.0 fL Final  . MCH 11/17/2015 26.8  26.0 - 34.0 pg Final  . MCHC 11/17/2015 32.7  30.0 - 36.0 g/dL Final  . RDW 11/17/2015 14.3  11.5 - 15.5 % Final  . Platelets 11/17/2015 217  150 - 400 K/uL Final  . Sodium 11/17/2015 141  135 - 145 mmol/L Final  . Potassium 11/17/2015 4.4  3.5 - 5.1 mmol/L Final  . Chloride 11/17/2015 106  101 - 111 mmol/L Final  . CO2 11/17/2015 28  22 - 32 mmol/L Final  . Glucose, Bld 11/17/2015 99  65 - 99 mg/dL Final  . BUN 11/17/2015 10  6 - 20 mg/dL Final  . Creatinine, Ser 11/17/2015 0.68  0.44 - 1.00 mg/dL Final  . Calcium 11/17/2015 9.3  8.9 - 10.3 mg/dL Final  . Total Protein 11/17/2015 7.3  6.5 - 8.1 g/dL Final  . Albumin 11/17/2015 3.6  3.5 - 5.0 g/dL Final  . AST 11/17/2015 17  15 - 41 U/L Final  . ALT 11/17/2015 10* 14 - 54 U/L Final  . Alkaline Phosphatase 11/17/2015 78  38 - 126 U/L Final  . Total Bilirubin 11/17/2015 0.9  0.3 - 1.2 mg/dL Final  . GFR calc  non Af Amer 11/17/2015 >60  >60 mL/min Final  . GFR calc Af Amer 11/17/2015 >60  >60 mL/min Final   Comment: (NOTE) The eGFR has been calculated using the CKD EPI equation. This calculation has not been validated in all clinical situations. eGFR's persistently <60 mL/min signify possible Chronic Kidney Disease.   . Anion gap 11/17/2015 7  5 - 15 Final  . Prothrombin Time 11/17/2015 13.6  11.6 - 15.2 seconds Final  . INR 11/17/2015 1.02  0.00 - 1.49 Final  . ABO/RH(D) 11/17/2015 O POS   Final  . Antibody Screen 11/17/2015 NEG   Final  . Sample Expiration 11/17/2015 11/26/2015   Final  . Extend sample reason 11/17/2015 NO TRANSFUSIONS OR PREGNANCY IN THE PAST 3 MONTHS   Final  . Color, Urine 11/17/2015 YELLOW  YELLOW Final  . APPearance 11/17/2015 CLEAR  CLEAR Final  . Specific Gravity, Urine 11/17/2015 1.010  1.005 - 1.030 Final  . pH 11/17/2015 7.0  5.0 - 8.0 Final  . Glucose, UA 11/17/2015 NEGATIVE  NEGATIVE mg/dL Final  . Hgb urine dipstick 11/17/2015 NEGATIVE  NEGATIVE Final  . Bilirubin Urine 11/17/2015 NEGATIVE  NEGATIVE Final  . Ketones, ur 11/17/2015 NEGATIVE  NEGATIVE mg/dL Final  . Protein, ur 11/17/2015 NEGATIVE  NEGATIVE mg/dL Final  . Nitrite 11/17/2015  NEGATIVE  NEGATIVE Final  . Leukocytes, UA 11/17/2015 NEGATIVE  NEGATIVE Final   MICROSCOPIC NOT DONE ON URINES WITH NEGATIVE PROTEIN, BLOOD, LEUKOCYTES, NITRITE, OR GLUCOSE <1000 mg/dL.     X-Rays:No results found.  EKG: Orders placed or performed during the hospital encounter of 05/28/15  . EKG 12-Lead  . EKG 12-Lead     Hospital Course: Andrea Norton is a 70 y.o. who was admitted to [facility]. They were brought to the operating room on 11/23/2015 and underwent Procedure(s): RIGHT TOTAL KNEE ARTHROPLASTY REIMPLANTATION.  Patient tolerated the procedure well and was later transferred to the recovery room and then to the orthopaedic floor for postoperative care.  They were given PO and IV analgesics for pain  control following their surgery.  They were given 24 hours of postoperative antibiotics of  Anti-infectives    Start     Dose/Rate Route Frequency Ordered Stop   11/23/15 2130  ceFAZolin (ANCEF) IVPB 2g/100 mL premix     2 g 200 mL/hr over 30 Minutes Intravenous Every 6 hours 11/23/15 1837 11/24/15 0406   11/23/15 1152  ceFAZolin (ANCEF) IVPB 2g/100 mL premix     2 g 200 mL/hr over 30 Minutes Intravenous On call to O.R. 11/23/15 1152 11/23/15 1531     and started on DVT prophylaxis in the form of Xarelto.   PT and OT were ordered for total joint protocol.  Discharge planning consulted to help with postop disposition and equipment needs.  Patient had a good night on the evening of surgery especially due to the nature of the surgery.  They started to get up OOB with therapy on day one. Hemovac drain was pulled without difficulty.  Continued to work with therapy into day two.  Dressing was changed on day two and the incision was healing well.Patient was seen in rounds and was ready to go home later on day two.  Discharge home with home health Diet - Cardiac diet and Diabetic diet Follow up - in 2 weeks Activity - WBAT Disposition - Home Condition Upon Discharge - Good D/C Meds - See DC Summary DVT Prophylaxis - Xarelto      Discharge Instructions    Call MD / Call 911    Complete by:  As directed   If you experience chest pain or shortness of breath, CALL 911 and be transported to the hospital emergency room.  If you develope a fever above 101 F, pus (white drainage) or increased drainage or redness at the wound, or calf pain, call your surgeon's office.     Change dressing    Complete by:  As directed   Change dressing daily with sterile 4 x 4 inch gauze dressing and apply TED hose. Do not submerge the incision under water.     Constipation Prevention    Complete by:  As directed   Drink plenty of fluids.  Prune juice may be helpful.  You may use a stool softener, such as Colace (over  the counter) 100 mg twice a day.  Use MiraLax (over the counter) for constipation as needed.     Diet - low sodium heart healthy    Complete by:  As directed      Discharge instructions    Complete by:  As directed   Pick up stool softner and laxative for home use following surgery while on pain medications. Do not submerge incision under water. Please use good hand washing techniques while changing dressing each day. May  shower starting three days after surgery. Please use a clean towel to pat the incision dry following showers. Continue to use ice for pain and swelling after surgery. Do not use any lotions or creams on the incision until instructed by your surgeon.  Take a 325 mg Aspirin twice a day for three weeks. After completing the three weeks of 325 mg Aspirin, they may resume the 81 mg Aspirin daily at home.  Postoperative Constipation Protocol  Constipation - defined medically as fewer than three stools per week and severe constipation as less than one stool per week.  One of the most common issues patients have following surgery is constipation.  Even if you have a regular bowel pattern at home, your normal regimen is likely to be disrupted due to multiple reasons following surgery.  Combination of anesthesia, postoperative narcotics, change in appetite and fluid intake all can affect your bowels.  In order to avoid complications following surgery, here are some recommendations in order to help you during your recovery period.  Colace (docusate) - Pick up an over-the-counter form of Colace or another stool softener and take twice a day as long as you are requiring postoperative pain medications.  Take with a full glass of water daily.  If you experience loose stools or diarrhea, hold the colace until you stool forms back up.  If your symptoms do not get better within 1 week or if they get worse, check with your doctor.  Dulcolax (bisacodyl) - Pick up over-the-counter and take as  directed by the product packaging as needed to assist with the movement of your bowels.  Take with a full glass of water.  Use this product as needed if not relieved by Colace only.   MiraLax (polyethylene glycol) - Pick up over-the-counter to have on hand.  MiraLax is a solution that will increase the amount of water in your bowels to assist with bowel movements.  Take as directed and can mix with a glass of water, juice, soda, coffee, or tea.  Take if you go more than two days without a movement. Do not use MiraLax more than once per day. Call your doctor if you are still constipated or irregular after using this medication for 7 days in a row.  If you continue to have problems with postoperative constipation, please contact the office for further assistance and recommendations.  If you experience "the worst abdominal pain ever" or develop nausea or vomiting, please contact the office immediatly for further recommendations for treatment.     Do not put a pillow under the knee. Place it under the heel.    Complete by:  As directed      Do not sit on low chairs, stoools or toilet seats, as it may be difficult to get up from low surfaces    Complete by:  As directed      Driving restrictions    Complete by:  As directed   No driving until released by the physician.     Increase activity slowly as tolerated    Complete by:  As directed      Lifting restrictions    Complete by:  As directed   No lifting until released by the physician.     Patient may shower    Complete by:  As directed   You may shower without a dressing once there is no drainage.  Do not wash over the wound.  If drainage remains, do not shower until drainage stops.  TED hose    Complete by:  As directed   Use stockings (TED hose) for 3 weeks on both leg(s).  You may remove them at night for sleeping.     Weight bearing as tolerated    Complete by:  As directed   Laterality:  right  Extremity:  Lower              Medication List    STOP taking these medications        aspirin EC 81 MG tablet     PRESCRIPTION MEDICATION     rivaroxaban 10 MG Tabs tablet  Commonly known as:  XARELTO      TAKE these medications        ASCRIPTIN 325 MG Tabs  Take 325 mg by mouth daily. Take a 325 mg Aspirin twice a day for three weeks, then discontinue the 325 mg Aspirin. After completing the 325 mg Aspirin, resume the 81 mg Aspirin daily at home.     docusate sodium 100 MG capsule  Commonly known as:  COLACE  Take 100 mg by mouth daily.     escitalopram 10 MG tablet  Commonly known as:  LEXAPRO  Take 10 mg by mouth at bedtime.     gabapentin 100 MG capsule  Commonly known as:  NEURONTIN  Take 300 mg by mouth 2 (two) times daily.     HYDROmorphone 2 MG tablet  Commonly known as:  DILAUDID  Take 1-2 tablets (2-4 mg total) by mouth every 4 (four) hours as needed for moderate pain or severe pain.     levothyroxine 125 MCG tablet  Commonly known as:  SYNTHROID, LEVOTHROID  Take 125 mcg by mouth daily before breakfast. Brand Name Only     methocarbamol 500 MG tablet  Commonly known as:  ROBAXIN  Take 1 tablet (500 mg total) by mouth every 6 (six) hours as needed for muscle spasms.     pantoprazole 40 MG tablet  Commonly known as:  PROTONIX  Take 40 mg by mouth daily with lunch.     simvastatin 40 MG tablet  Commonly known as:  ZOCOR  Take 40 mg by mouth daily.       Follow-up Information    Follow up with [facility].   Why:  home health physical therapy   Contact information:   [place] [address] [phone]       Follow up with Gearlean Alf, MD. Schedule an appointment as soon as possible for a visit on 12/08/2015.   Specialty:  Orthopedic Surgery   Why:  Call office at [phone] to [place] appointment on Tuesday 12/08/2015 with Dr. Wynelle Link.   Contact information:   [address] [place] [address] [phone]        Signed: Arlee Muslim, PA-C Orthopaedic Surgery 11/25/2015, 8:07 AM

## 2015-11-24 NOTE — Progress Notes (Signed)
Occupational Therapy Evaluation Patient Details Name: Andrea Norton MRN: [id number] DOB: 02-27-1946 Today's Date: 11/24/2015    History of Present Illness Pt is s/p RIGHT TOTAL KNEE ARTHROPLASTY REIMPLANTATION (Right)   Clinical Impression   All OT education completed and pt questions answered. No further OT needs. Will sign off.    Follow Up Recommendations  No OT follow up;Supervision/Assistance - 24 hour    Equipment Recommendations  None recommended by OT    Recommendations for Other Services       Precautions / Restrictions Precautions Precautions: Knee;Fall Required Braces or Orthoses: Knee Immobilizer - Right Restrictions Weight Bearing Restrictions: No Other Position/Activity Restrictions: WBAT      Mobility Bed Mobility               General bed mobility comments: NT -- OOB with PT  Transfers Overall transfer level: Needs assistance Equipment used: Rolling walker (2 wheeled) Transfers: Sit to/from Stand Sit to Stand: Min guard              Balance                                            ADL Overall ADL's : Needs assistance/impaired Eating/Feeding: Independent;Sitting   Grooming: Wash/dry hands;Min guard;Standing   Upper Body Bathing: Set up;Sitting   Lower Body Bathing: Moderate assistance;Sit to/from stand   Upper Body Dressing : Set up;Sitting   Lower Body Dressing: Moderate assistance;Sit to/from stand   Toilet Transfer: Min guard;Ambulation;Regular Toilet;BSC;RW   Toileting- ArchitectClothing Manipulation and Hygiene: Min guard;Sit to/from stand       Functional mobility during ADLs: Min guard;Rolling walker General ADL Comments: Patient and husband report familiarity with managing ADLs/ADL techniques. Practiced ambulation to bathroom, toilet transfer with 3 in 1 over toilet, toileting, grooming during session. Husband and patient report that husband to assist with bathing/dressing at home. They have been managing  shower transfer after multiple knee surgeries without difficulty and deny need to practice. No further OT needs.     Vision     Perception     Praxis      Pertinent Vitals/Pain Pain Assessment: 0-10 Pain Score: 4  Pain Location: R knee and foot Pain Descriptors / Indicators: Burning;Sore Pain Intervention(s): Limited activity within patient's tolerance;Monitored during session;Repositioned;Ice applied     Hand Dominance     Extremity/Trunk Assessment Upper Extremity Assessment Upper Extremity Assessment: Overall WFL for tasks assessed   Lower Extremity Assessment Lower Extremity Assessment: Defer to PT evaluation       Communication Communication Communication: No difficulties   Cognition Arousal/Alertness: Awake/alert Behavior During Therapy: WFL for tasks assessed/performed Overall Cognitive Status: Within Functional Limits for tasks assessed                     General Comments       Exercises       Shoulder Instructions      Home Living Family/patient expects to be discharged to:: Private residence Living Arrangements: Spouse/significant other Available Help at Discharge: Family Type of Home: Mobile home Home Access: Ramped entrance     Home Layout: One level     Bathroom Shower/Tub: Arts development officerWalk-in shower   Bathroom Toilet: Handicapped height Bathroom Accessibility: Yes   Home Equipment: Environmental consultantWalker - 2 wheels;Walker - 4 wheels;Wheelchair - manual;Cane - single point;Bedside commode   Additional Comments: uses 3 in 1  in shower too      Prior Functioning/Environment Level of Independence: Needs assistance  Gait / Transfers Assistance Needed: pivoting to wheelchair prior to this admission (due to spacer) ADL's / Homemaking Assistance Needed: husband assisted with bathing, dressing, shower transfer prior to admission        OT Diagnosis: Acute pain   OT Problem List: Decreased strength;Decreased range of motion;Pain   OT  Treatment/Interventions:      OT Goals(Current goals can be found in the care plan section) Acute Rehab OT Goals Patient Stated Goal: to get on my feet again OT Goal Formulation: All assessment and education complete, DC therapy  OT Frequency:     Barriers to D/C:            Co-evaluation              End of Session Equipment Utilized During Treatment: Gait belt;Rolling walker;Right knee immobilizer Nurse Communication: Mobility status  Activity Tolerance: Patient tolerated treatment well Patient left: in chair;with call bell/phone within reach;with chair alarm set;with family/visitor present   Time: 0913-0930 OT Time Calculation (min): 17 min Charges:  OT General Charges $OT Visit: 1 Procedure OT Evaluation $OT Eval Low Complexity: 1 Procedure G-Codes:    Crucita Lacorte A Dec 11, 2015, 9:41 AM

## 2015-11-24 NOTE — Care Management Note (Signed)
Case Management Note  Patient Details  Name: Andrea Norton MRN: [id number] Date of Birth: Oct 10, 1945  Subjective/Objective:                  RIGHT TOTAL KNEE ARTHROPLASTY REIMPLANTATION (Right) Action/Plan: Discharge planning Expected Discharge Date:  11/25/15               Expected Discharge Plan:  [place]  In-House Referral:     Discharge planning Services  CM Consult  Post Acute Care Choice:  Home Health Choice offered to:  Patient  DME Arranged:  N/A DME Agency:  NA  HH Arranged:  PT HH Agency:  [facility]  Status of Service:  Completed, signed off  Medicare Important Message Given:    Date Medicare IM Given:    Medicare IM give by:    Date Additional Medicare IM Given:    Additional Medicare Important Message give by:     If discussed at [place] of Stay Meetings, dates discussed:    Additional Comments: CM met with pt in room to offer choice of home health agency.  Pt chooses [facility] to render HHPT.  Referral given to [facility] rep, Tim (on unit).  Pt has DME at home.  No other CM needs were communicated. Dellie Catholic, RN 11/24/2015, 2:02 PM

## 2015-11-24 NOTE — Progress Notes (Signed)
Physical Therapy Treatment Patient Details Name: Andrea Norton MRN: [id number] DOB: 07/15/1946 Today's Date: 11/24/2015    History of Present Illness 70 yo female s/p R TKA reimplantation 11/23/15.     PT Comments    Progressing with mobility.   Follow Up Recommendations  Home health PT     Equipment Recommendations  None recommended by PT    Recommendations for Other Services       Precautions / Restrictions Precautions Precautions: Fall;Knee Required Braces or Orthoses: Knee Immobilizer - Left Knee Immobilizer - Left: Discontinue once straight leg raise with < 10 degree lag Restrictions Weight Bearing Restrictions: No RLE Weight Bearing: Weight bearing as tolerated    Mobility  Bed Mobility Overal bed mobility: Needs Assistance Bed Mobility: Sit to Supine       Sit to supine: Min assist   General bed mobility comments: Assist for R LE  Transfers Overall transfer level: Needs assistance Equipment used: Rolling walker (2 wheeled) Transfers: Sit to/from Stand Sit to Stand: Min guard         General transfer comment: close guard for safety. VCs safety, hand placement  Ambulation/Gait Ambulation/Gait assistance: Min guard Ambulation Distance (Feet): 75 Feet Assistive device: Rolling walker (2 wheeled) Gait Pattern/deviations: Step-to pattern;Trunk flexed     General Gait Details: VCs safety, sequence, posture. close guard for safety.    Stairs            Wheelchair Mobility    Modified Rankin (Stroke Patients Only)       Balance                                    Cognition Arousal/Alertness: Awake/alert Behavior During Therapy: WFL for tasks assessed/performed Overall Cognitive Status: Within Functional Limits for tasks assessed                      Exercises      General Comments        Pertinent Vitals/Pain Pain Assessment: 0-10 Pain Score: 5  Pain Location: R knee with activity Pain Descriptors /  Indicators: Sore Pain Intervention(s): Monitored during session;Ice applied;Repositioned    Home Living                      Prior Function            PT Goals (current goals can now be found in the care plan section) Progress towards PT goals: Progressing toward goals    Frequency  7X/week    PT Plan Current plan remains appropriate    Co-evaluation             End of Session   Activity Tolerance: Patient tolerated treatment well Patient left: in bed;with call bell/phone within reach;with family/visitor present     Time: 1325-1340 PT Time Calculation (min) (ACUTE ONLY): 15 min  Charges:  $Gait Training: 8-22 mins                    G Codes:      Andrea Norton, MPT Pager: [phone]

## 2015-11-24 NOTE — Discharge Instructions (Addendum)
° °Dr. Frank Aluisio °Total Joint Specialist °[facility] °[address] °[place], [place] [address] °[phone] ° °TOTAL KNEE REPLACEMENT POSTOPERATIVE DIRECTIONS ° °Knee Rehabilitation, Guidelines Following Surgery  °Results after knee surgery are often greatly improved when you follow the exercise, range of motion and muscle strengthening exercises prescribed by your doctor. Safety measures are also important to protect the knee from further injury. Any time any of these exercises cause you to have increased pain or swelling in your knee joint, decrease the amount until you are comfortable again and slowly increase them. If you have problems or questions, call your caregiver or physical therapist for advice.  ° °HOME CARE INSTRUCTIONS  °Remove items at home which could result in a fall. This includes throw rugs or furniture in walking pathways.  °· ICE to the affected knee every three hours for 30 minutes at a time and then as needed for pain and swelling.  Continue to use ice on the knee for pain and swelling from surgery. You may notice swelling that will progress down to the foot and ankle.  This is normal after surgery.  Elevate the leg when you are not up walking on it.   °· Continue to use the breathing machine which will help keep your temperature down.  It is common for your temperature to cycle up and down following surgery, especially at night when you are not up moving around and exerting yourself.  The breathing machine keeps your lungs expanded and your temperature down. °· Do not place pillow under knee, focus on keeping the knee straight while resting ° °DIET °You may resume your previous home diet once your are discharged from the hospital. ° °DRESSING / WOUND CARE / SHOWERING °You may shower 3 days after surgery, but keep the wounds dry during showering.  You may use an occlusive plastic wrap (Press'n Seal for example), NO SOAKING/SUBMERGING IN THE BATHTUB.  If the  bandage gets wet, change with a clean dry gauze.  If the incision gets wet, pat the wound dry with a clean towel. °You may start showering once you are discharged home but do not submerge the incision under water. Just pat the incision dry and apply a dry gauze dressing on daily. °Change the surgical dressing daily and reapply a dry dressing each time. ° °ACTIVITY °Walk with your walker as instructed. °Use walker as long as suggested by your caregivers. °Avoid periods of inactivity such as sitting longer than an hour when not asleep. This helps prevent blood clots.  °You may resume a sexual relationship in one month or when given the OK by your doctor.  °You may return to work once you are cleared by your doctor.  °Do not drive a car for 6 weeks or until released by you surgeon.  °Do not drive while taking narcotics. ° °WEIGHT BEARING °Weight bearing as tolerated with assist device (walker, cane, etc) as directed, use it as long as suggested by your surgeon or therapist, typically at least 4-6 weeks. ° °POSTOPERATIVE CONSTIPATION PROTOCOL °Constipation - defined medically as fewer than three stools per week and severe constipation as less than one stool per week. ° °One of the most common issues patients have following surgery is constipation.  Even if you have a regular bowel pattern at home, your normal regimen is likely to be disrupted due to multiple reasons following surgery.  Combination of anesthesia, postoperative narcotics, change in appetite and fluid intake all can affect your bowels.    In order to avoid complications following surgery, here are some recommendations in order to help you during your recovery period. ° °Colace (docusate) - Pick up an over-the-counter form of Colace or another stool softener and take twice a day as long as you are requiring postoperative pain medications.  Take with a full glass of water daily.  If you experience loose stools or diarrhea, hold the colace until you stool forms  back up.  If your symptoms do not get better within 1 week or if they get worse, check with your doctor. ° °Dulcolax (bisacodyl) - Pick up over-the-counter and take as directed by the product packaging as needed to assist with the movement of your bowels.  Take with a full glass of water.  Use this product as needed if not relieved by Colace only.  ° °MiraLax (polyethylene glycol) - Pick up over-the-counter to have on hand.  MiraLax is a solution that will increase the amount of water in your bowels to assist with bowel movements.  Take as directed and can mix with a glass of water, juice, soda, coffee, or tea.  Take if you go more than two days without a movement. °Do not use MiraLax more than once per day. Call your doctor if you are still constipated or irregular after using this medication for 7 days in a row. ° °If you continue to have problems with postoperative constipation, please contact the office for further assistance and recommendations.  If you experience "the worst abdominal pain ever" or develop nausea or vomiting, please contact the office immediatly for further recommendations for treatment. ° °ITCHING ° If you experience itching with your medications, try taking only a single pain pill, or even half a pain pill at a time.  You can also use Benadryl over the counter for itching or also to help with sleep.  ° °TED HOSE STOCKINGS °Wear the elastic stockings on both legs for three weeks following surgery during the day but you may remove then at night for sleeping. ° °MEDICATIONS °See your medication summary on the “After Visit Summary” that the nursing staff will review with you prior to discharge.  You may have some home medications which will be placed on hold until you complete the course of blood thinner medication.  It is important for you to complete the blood thinner medication as prescribed by your surgeon.  Continue your approved medications as instructed at time of  discharge. ° °PRECAUTIONS °If you experience chest pain or shortness of breath - call 911 immediately for transfer to the hospital emergency department.  °If you develop a fever greater that 101 F, purulent drainage from wound, increased redness or drainage from wound, foul odor from the wound/dressing, or calf pain - CONTACT YOUR SURGEON.   °                                                °FOLLOW-UP APPOINTMENTS °Make sure you keep all of your appointments after your operation with your surgeon and caregivers. You should call the office at the above phone number and make an appointment for approximately two weeks after the date of your surgery or on the date instructed by your surgeon outlined in the "After Visit Summary". ° ° °RANGE OF MOTION AND STRENGTHENING EXERCISES  °Rehabilitation of the knee is important following a knee injury or   an operation. After just a few days of immobilization, the muscles of the thigh which control the knee become weakened and shrink (atrophy). Knee exercises are designed to build up the tone and strength of the thigh muscles and to improve knee motion. Often times heat used for twenty to thirty minutes before working out will loosen up your tissues and help with improving the range of motion but do not use heat for the first two weeks following surgery. These exercises can be done on a training (exercise) mat, on the floor, on a table or on a bed. Use what ever works the best and is most comfortable for you Knee exercises include:  Leg Lifts - While your knee is still immobilized in a splint or cast, you can do straight leg raises. Lift the leg to 60 degrees, hold for 3 sec, and slowly lower the leg. Repeat 10-20 times 2-3 times daily. Perform this exercise against resistance later as your knee gets better.  Quad and Hamstring Sets - Tighten up the muscle on the front of the thigh (Quad) and hold for 5-10 sec. Repeat this 10-20 times hourly. Hamstring sets are done by pushing the  foot backward against an object and holding for 5-10 sec. Repeat as with quad sets.   Leg Slides: Lying on your back, slowly slide your foot toward your buttocks, bending your knee up off the floor (only go as far as is comfortable). Then slowly slide your foot back down until your leg is flat on the floor again.  Angel Wings: Lying on your back spread your legs to the side as far apart as you can without causing discomfort.  A rehabilitation program following serious knee injuries can speed recovery and prevent re-injury in the future due to weakened muscles. Contact your doctor or a physical therapist for more information on knee rehabilitation.   IF YOU ARE TRANSFERRED TO A SKILLED REHAB FACILITY If the patient is transferred to a skilled rehab facility following release from the hospital, a list of the current medications will be sent to the facility for the patient to continue.  When discharged from the skilled rehab facility, please have the facility set up the patient's Home Health Physical Therapy prior to being released. Also, the skilled facility will be responsible for providing the patient with their medications at time of release from the facility to include their pain medication, the muscle relaxants, and their blood thinner medication. If the patient is still at the rehab facility at time of the two week follow up appointment, the skilled rehab facility will also need to assist the patient in arranging follow up appointment in our office and any transportation needs.  MAKE SURE YOU:  Understand these instructions.  Get help right away if you are not doing well or get worse.    Pick up stool softner and laxative for home use following surgery while on pain medications. Do not submerge incision under water. Please use good hand washing techniques while changing dressing each day. May shower starting three days after surgery. Please use a clean towel to pat the incision dry following  showers. Continue to use ice for pain and swelling after surgery. Do not use any lotions or creams on the incision until instructed by your surgeon.  Take a 325 mg Aspirin twice a day for three weeks. After completing the three weeks of 325 mg Aspirin, they may resume the 81 mg Aspirin daily at home.    Information on my  medicine - XARELTO (Rivaroxaban)  This medication education was reviewed with me or my healthcare representative as part of my discharge preparation.  The pharmacist that spoke with me during my hospital stay was:  Glogovac,nikola, [facility]  Why was Xarelto prescribed for you? Xarelto was prescribed for you to reduce the risk of blood clots forming after orthopedic surgery. The medical term for these abnormal blood clots is venous thromboembolism (VTE).  What do you need to know about xarelto ? Take your Xarelto ONCE DAILY at the same time every day. You may take it either with or without food.  If you have difficulty swallowing the tablet whole, you may crush it and mix in applesauce just prior to taking your dose.  Take Xarelto exactly as prescribed by your doctor and DO NOT stop taking Xarelto without talking to the doctor who prescribed the medication.  Stopping without other VTE prevention medication to take the place of Xarelto may increase your risk of developing a clot.  After discharge, you should have regular check-up appointments with your healthcare provider that is prescribing your Xarelto.    What do you do if you miss a dose? If you miss a dose, take it as soon as you remember on the same day then continue your regularly scheduled once daily regimen the next day. Do not take two doses of Xarelto on the same day.   Important Safety Information A possible side effect of Xarelto is bleeding. You should call your healthcare provider right away if you experience any of the following: ? Bleeding from an injury or your nose that does not stop. ? Unusual  colored urine (red or dark brown) or unusual colored stools (red or black). ? Unusual bruising for unknown reasons. ? A serious fall or if you hit your head (even if there is no bleeding).  Some medicines may interact with Xarelto and might increase your risk of bleeding while on Xarelto. To help avoid this, consult your healthcare provider or pharmacist prior to using any new prescription or non-prescription medications, including herbals, vitamins, non-steroidal anti-inflammatory drugs (NSAIDs) and supplements.  This website has more information on Xarelto: VisitDestination.com.br.

## 2015-11-24 NOTE — Op Note (Signed)
NAMMort Sawyers:  Norton, Andrea Norton                ACCOUNT NO.:  [id number]  MEDICAL RECORD NO.:  [id number]  LOCATION:  1615                         [facility]  PHYSICIAN:  Ollen GrossFrank Ronee Ranganathan, M.D.    DATE OF BIRTH:  06-01-1946  DATE OF PROCEDURE:  11/23/2015 DATE OF DISCHARGE:                              OPERATIVE REPORT   PREOPERATIVE DIAGNOSIS:  Resection arthroplasty, right knee.  POSTOPERATIVE DIAGNOSIS:  Resection arthroplasty, right knee.  PROCEDURE:  Right total knee arthroplasty and reimplantation.  SURGEON:  Ollen GrossFrank Yony Roulston, M.D.  ASSISTANT:  Alexzandrew L. Perkins, PA-C.  ANESTHESIA:  Spinal.  ESTIMATED BLOOD LOSS:  Minimal.  DRAINS:  Hemovac x1.  COMPLICATIONS:  None.  CONDITION:  Stable to recovery.  BRIEF CLINICAL NOTE:  [name] Norton is a 70 year old female, with very long complex history in regard to her right knee.  She has had multiple operations in the past.  She had resection arthroplasty for infection done approximately 2-1/2 months ago.  She had 6 weeks of postoperative antibiotics.  All blood work came back normal.  Clinically, the knee had improved dramatically.  She presents now for total knee arthroplasty and reimplantation.  PROCEDURE IN DETAIL:  After successful administration of spinal anesthetic, a tourniquet was placed high on her right thigh and her right lower extremity was prepped and draped in the usual sterile fashion.  Extremity was wrapped in Esmarch and tourniquet inflated to 300 mmHg.  A midline incision was made with a 10 blade through subcutaneous tissue to the extensor mechanism.  A fresh blade was used make a medial parapatellar arthrotomy.  Minimal amount of old hematoma was present and we sent that for stat Gram stain, which was negative. The soft tissue over the proximal medial tibia subperiosteally elevated to the joint line with a knife into the semimembranosus bursa with a Cobb elevator.  Soft tissue laterally was elevated with attention  being paid to avoiding the patellar tendon on tibial tubercle.  Patella was everted.  I was able to remove the very large tibial spacer.  We then addressed the tibia.  Retractors were placed circumferentially.  There is tibial bone loss anteriorly.  I was able to identify the tibial canal and removed all the soft tissue from the canal as well as around the proximal tibial surface.  The tibia was then reamed in the canal up to 13 mm with placement of a 13 x 60 stem.  There was not enough good bone proximally. Thus, I felt as though we have to use a sleeve in order to gain our proximal fixation.  We got up to a 45 sleeve and it has had great torsional fit.  It was down far enough where I had to use a buildup tibial component, which was the 15 mm thickness which was a size 1 distally up to a 3 proximally.  We then prepared the femur.  I cleaned out the femoral canal while reaming it up to 16 mm, which had an excellent press-fit.  We then removed the reamer and broached up to the third size broach which was 35.  This had fantastic fit and would allow me to build the component out more  distally.  I attached an extra small component to that.  We trialed then, previously it was a 31 mm space and we had to get to 31 again, which allowed for full extension with excellent balance throughout full range of motion.  The patella was everted and we cut the thickness down to 11 mm.  A 35 template was placed.  Lug holes were drilled.  Trial patella was placed.  I had to do a lateral release because there was a tremendous amount of scar tissue, which was pulling it laterally.  With the release, I was able to track normally.  We then removed the trials and I prepared for the cement restrictor in the tibia.  Size 4 was most appropriate size and we placed in the appropriate depth in the tibial canal.  The cut bone surfaces were then thoroughly irrigated with pulsatile lavage.  On the back table,  the implants were assembled.  This was the MBT revision tray, which was 15 thick size 3 proximal, 1 distal attached to a 45 sleeve and then a 13 x 60 stem extension.  We set rotation on the sleeve and then impacted it to lock it in position.  On the femoral side, an extra-small hinged femur attached to the sleeve and then a 75 x 16 stem extension.  Cement was mixed, which was gentamicin impregnated cement.  We injected into the tibial canal and also cemented proximally.  I impacted the tibia and removed any extruded cement.  On the femoral side, we cemented distally and had a press-fit stem.  Sleeve was also porous coated.  We placed a 31 trial insert and placed the knee in full extension and all extruded cement removed.  A 35 patella was cemented into place and held with patella clamp.  When the cement was fully hardened, then the permanent 31 mm rotating hinged insert was placed and the locking pin was placed through the distal femur to lock the polyethylene in position.  Wound was then copiously irrigated with saline solution.  Arthrotomy was closed with a running #1 V-Loc suture and then additional PDS sutures were used to fortify the closure.  The patella tracked normally and I was able to get 120 degrees of flexion.  The tourniquet was then released, total time of 68 minutes and the bleeding was stopped with electrocautery.  A Hemovac drain was then placed in the joint.  Subcu was then closed with interrupted 2-0 Vicryl and skin with staples. Prior to closure, 20 mL of Exparel mixed with 40 mL of saline were injected into the subcu tissues, extensor mechanism, medial retinaculum, and periosteum of the femur.  Additional 20 mL of 0.25% Marcaine was injected into the same tissues.  Once closed, the incision was cleaned and dried and a bulky sterile dressing applied.  She was placed in a knee immobilizer, awakened, and transported to recovery in stable condition.  Note that the  surgical assistant is a medical necessity for this very complex procedure.  Assistant was necessary for retraction of vital ligaments and neurovascular structures and proper placement and alignment of this complex revision prosthesis.     Ollen Gross, M.D.     FA/MEDQ  D:  11/23/2015  T:  11/24/2015  Job:  [id number]

## 2015-11-24 NOTE — Progress Notes (Signed)
Subjective: 1 Day Post-Op Procedure(s) (LRB): RIGHT TOTAL KNEE ARTHROPLASTY REIMPLANTATION (Right) Patient reports pain as mild.   Patient seen in rounds with Dr. Lequita HaltAluisio. Husband in room. Patient is well, but has had some minor complaints of pain in the knee, requiring pain medications We will start therapy today.  Plan is to go Home after hospital stay.  Objective: Vital signs in last 24 hours: Temp:  [97.4 F (36.3 C)-98.3 F (36.8 C)] 97.8 F (36.6 C) (05/02 0415) Pulse Rate:  [62-72] 68 (05/02 0415) Resp:  [16-18] 16 (05/02 0415) BP: (120-158)/(63-77) 127/67 mmHg (05/02 0415) SpO2:  [95 %-98 %] 98 % (05/02 0415) Weight:  [100.245 kg (221 lb)] 100.245 kg (221 lb) (05/01 1150)  Intake/Output from previous day:  Intake/Output Summary (Last 24 hours) at 11/24/15 0714 Last data filed at 11/24/15 0416  Gross per 24 hour  Intake   2320 ml  Output   3015 ml  Net   -695 ml    Intake/Output this shift: UOP 1250  Labs:  Recent Labs  11/24/15 0357  HGB 11.8*    Recent Labs  11/24/15 0357  WBC 9.4  RBC 4.38  HCT 36.4  PLT 243    Recent Labs  11/24/15 0357  NA 140  K 4.5  CL 108  CO2 23  BUN 15  CREATININE 0.74  GLUCOSE 186*  CALCIUM 8.8*   No results for input(s): LABPT, INR in the last 72 hours.  EXAM General - Patient is Alert, Appropriate and Oriented Extremity - Neurovascular intact Sensation intact distally Dressing - dressing C/D/I Motor Function - intact, moving foot and toes well on exam.  Hemovac left in place today.  Past Medical History  Diagnosis Date  . Hyperlipidemia   . Status post primary angioplasty with coronary stent     DRUG-ELUTING STENT X1  TO PROXIMAL LAD  . Chronic back pain   . Chronic neck pain   . Arthritis     HANDS, KNEES  . DDD (degenerative disc disease), cervical   . DDD (degenerative disc disease), lumbar   . Radicular pain in left arm OCCASIONAL PAIN SHOOTING DOWN LEFT ARM SECAONDARY TO  CERVICAL  DEGENERATION  . Hemorrhoid   . Diverticulitis of colon   . B12 deficiency anemia   . Vitamin D deficiency   . Hypothyroidism   . Synovitis of knee HYPERTROPHIC RIGHT KNEE  . Abnormal stress test 01-22-2009    LOW RISK ADENOSINE NUCLEAR STUDY W/ PROBABLE MILD APICAL THINNING BUT NO ISCHEMIA  . Depression     states well controlled  . GERD (gastroesophageal reflux disease)   . Inguinal hernia, right     with ventral hernia  . Hemorrhoids   . Coronary artery disease 2005    STRESS TEST /, NOte Dr Kirtland BouchardHocherin EPIC/ no cardiologist now--STATES HEART STENT PLACED 2010  . Bronchitis     hx of   . H/O hiatal hernia   . Pneumonia     hx of walking pneumonia  . Hypertension       PCP Dr Lysbeth GalasNyland; pt currently on no medication for treatment   . Sleep apnea     couldnt tolerate machine- states last sleep study " many years ago"  . Diabetes mellitus without complication (HCC)     pt states since having gastric banding does not have take medication was on metformin in past  . History of urinary tract infection   . Urinary incontinence     Assessment/Plan: 1 Day  Post-Op Procedure(s) (LRB): RIGHT TOTAL KNEE ARTHROPLASTY REIMPLANTATION (Right) Principal Problem:   Septic arthritis of knee, right (HCC) Active Problems:   OA (osteoarthritis) of knee  Estimated body mass index is 37.92 kg/(m^2) as calculated from the following:   Height as of this encounter:  (1.626 m).   Weight as of this encounter: 100.245 kg (221 lb). Advance diet Up with therapy Plan for discharge tomorrow Discharge home with home health  DVT Prophylaxis - Xarelto Weight-Bearing as tolerated to right leg D/C O2 and Pulse OX and try on Room Air  [name], PA-C Orthopaedic Surgery 11/24/2015, 7:14 AM

## 2015-11-24 NOTE — Progress Notes (Signed)
Utilization review completed.  

## 2015-11-25 LAB — CBC
HCT: 33.8 % — ABNORMAL LOW (ref 36.0–46.0)
HEMOGLOBIN: 11.1 g/dL — AB (ref 12.0–15.0)
MCH: 27.3 pg (ref 26.0–34.0)
MCHC: 32.8 g/dL (ref 30.0–36.0)
MCV: 83.3 fL (ref 78.0–100.0)
PLATELETS: 228 10*3/uL (ref 150–400)
RBC: 4.06 MIL/uL (ref 3.87–5.11)
RDW: 14.2 % (ref 11.5–15.5)
WBC: 9.7 10*3/uL (ref 4.0–10.5)

## 2015-11-25 LAB — BASIC METABOLIC PANEL
ANION GAP: 5 (ref 5–15)
BUN: 9 mg/dL (ref 6–20)
CHLORIDE: 110 mmol/L (ref 101–111)
CO2: 27 mmol/L (ref 22–32)
Calcium: 8.8 mg/dL — ABNORMAL LOW (ref 8.9–10.3)
Creatinine, Ser: 0.57 mg/dL (ref 0.44–1.00)
GFR calc Af Amer: >60 mL/min (ref >60)
Glucose, Bld: 122 mg/dL — ABNORMAL HIGH (ref 65–99)
POTASSIUM: 4.5 mmol/L (ref 3.5–5.1)
SODIUM: 142 mmol/L (ref 135–145)

## 2015-11-25 MED ORDER — METHOCARBAMOL 500 MG PO TABS: 500.0000 mg | ORAL_TABLET | Freq: Four times a day (QID) | ORAL | Status: DC | PRN

## 2015-11-25 MED ORDER — ASCRIPTIN 325 MG PO TABS: 325.0000 mg | ORAL_TABLET | Freq: Every day | ORAL | Status: DC

## 2015-11-25 MED ORDER — HYDROMORPHONE HCL 2 MG PO TABS: 2.0000 mg | ORAL_TABLET | ORAL | Status: DC | PRN

## 2015-11-25 NOTE — Progress Notes (Signed)
Subjective: 2 Days Post-Op Procedure(s) (LRB): RIGHT TOTAL KNEE ARTHROPLASTY REIMPLANTATION (Right) Patient reports pain as mild.   Patient seen in rounds with Dr. Lequita HaltAluisio. Patient is well, and has had no acute complaints or problems Patient is ready to go home  Objective: Vital signs in last 24 hours: Temp:  [97.5 F (36.4 C)-98.2 F (36.8 C)] 97.7 F (36.5 C) (05/03 0606) Pulse Rate:  [64-84] 64 (05/03 0606) Resp:  [16-18] 16 (05/03 0606) BP: (122-141)/(57-73) 141/73 mmHg (05/03 0606) SpO2:  [92 %-98 %] 98 % (05/03 0606)  Intake/Output from previous day:  Intake/Output Summary (Last 24 hours) at 11/25/15 0719 Last data filed at 11/25/15 0608  Gross per 24 hour  Intake   5880 ml  Output   1215 ml  Net   4665 ml    Labs:  Recent Labs  11/24/15 0357 11/25/15 0411  HGB 11.8* 11.1*    Recent Labs  11/24/15 0357 11/25/15 0411  WBC 9.4 9.7  RBC 4.38 4.06  HCT 36.4 33.8*  PLT 243 228    Recent Labs  11/24/15 0357 11/25/15 0411  NA 140 142  K 4.5 4.5  CL 108 110  CO2 23 27  BUN 15 9  CREATININE 0.74 0.57  GLUCOSE 186* 122*  CALCIUM 8.8* 8.8*   No results for input(s): LABPT, INR in the last 72 hours.  EXAM: General - Patient is Alert and Appropriate Extremity - Neurovascular intact Sensation intact distally Dorsiflexion/Plantar flexion intact Incision - clean, dry, no drainage Motor Function - intact, moving foot and toes well on exam.   Assessment/Plan: 2 Days Post-Op Procedure(s) (LRB): RIGHT TOTAL KNEE ARTHROPLASTY REIMPLANTATION (Right) Procedure(s) (LRB): RIGHT TOTAL KNEE ARTHROPLASTY REIMPLANTATION (Right) Past Medical History  Diagnosis Date  . Hyperlipidemia   . Status post primary angioplasty with coronary stent     DRUG-ELUTING STENT X1  TO PROXIMAL LAD  . Chronic back pain   . Chronic neck pain   . Arthritis     HANDS, KNEES  . DDD (degenerative disc disease), cervical   . DDD (degenerative disc disease), lumbar   .  Radicular pain in left arm OCCASIONAL PAIN SHOOTING DOWN LEFT ARM SECAONDARY TO  CERVICAL DEGENERATION  . Hemorrhoid   . Diverticulitis of colon   . B12 deficiency anemia   . Vitamin D deficiency   . Hypothyroidism   . Synovitis of knee HYPERTROPHIC RIGHT KNEE  . Abnormal stress test 01-22-2009    LOW RISK ADENOSINE NUCLEAR STUDY W/ PROBABLE MILD APICAL THINNING BUT NO ISCHEMIA  . Depression     states well controlled  . GERD (gastroesophageal reflux disease)   . Inguinal hernia, right     with ventral hernia  . Hemorrhoids   . Coronary artery disease 2005    STRESS TEST /, NOte Dr Kirtland BouchardHocherin EPIC/ no cardiologist now--STATES HEART STENT PLACED 2010  . Bronchitis     hx of   . H/O hiatal hernia   . Pneumonia     hx of walking pneumonia  . Hypertension       PCP Dr Lysbeth GalasNyland; pt currently on no medication for treatment   . Sleep apnea     couldnt tolerate machine- states last sleep study " many years ago"  . Diabetes mellitus without complication (HCC)     pt states since having gastric banding does not have take medication was on metformin in past  . History of urinary tract infection   . Urinary incontinence    Principal  Problem:   Septic arthritis of knee, right (HCC) Active Problems:   OA (osteoarthritis) of knee  Estimated body mass index is 37.92 kg/(m^2) as calculated from the following:   Height as of this encounter:  (1.626 m).   Weight as of this encounter: 100.245 kg (221 lb). Up with therapy Discharge home with home health Diet - Cardiac diet and Diabetic diet Follow up - in 2 weeks Activity - WBAT Disposition - Home Condition Upon Discharge - Good D/C Meds - See DC Summary DVT Prophylaxis - Xarelto  [name], PA-C Orthopaedic Surgery 11/25/2015, 7:19 AM

## 2015-11-25 NOTE — Progress Notes (Signed)
Hemovac came out while patient was sleeping. Nurse reinforced ace wrap with abdominal pad due to a small amount of bloody drainage from Hemovac.

## 2015-11-25 NOTE — Progress Notes (Signed)
Physical Therapy Treatment Patient Details Name: Andrea Norton MRN: [id number] DOB: 11/14/1945 Today's Date: 11/25/2015    History of Present Illness 70 yo female s/p R TKA reimplantation 11/23/15.     PT Comments    Progressing well with mobility. Encouraged pt to wear KI when OOB. Practiced/reviewed exercises and gait training. All education completed. Ready to d/c from PT standpoint-made RN aware.   Follow Up Recommendations  Home health PT     Equipment Recommendations  None recommended by PT    Recommendations for Other Services       Precautions / Restrictions Precautions Precautions: Knee;Fall Required Braces or Orthoses: Knee Immobilizer - Left Knee Immobilizer - Left: Discontinue once straight leg raise with < 10 degree lag Restrictions Weight Bearing Restrictions: No RLE Weight Bearing: Weight bearing as tolerated    Mobility  Bed Mobility Overal bed mobility: Needs Assistance Bed Mobility: Supine to Sit     Supine to sit: Min assist     General bed mobility comments: small amount of assist for R LE off bed  Transfers Overall transfer level: Needs assistance Equipment used: Rolling walker (2 wheeled) Transfers: Sit to/from Stand Sit to Stand: Min guard         General transfer comment: close guard for safety. VCs safety, hand placement  Ambulation/Gait Ambulation/Gait assistance: Min guard Ambulation Distance (Feet): 100 Feet Assistive device: Rolling walker (2 wheeled) Gait Pattern/deviations: Step-to pattern;Trunk flexed     General Gait Details: VCs safety, sequence, posture. close guard for safety.    Stairs            Wheelchair Mobility    Modified Rankin (Stroke Patients Only)       Balance                                    Cognition Arousal/Alertness: Awake/alert Behavior During Therapy: WFL for tasks assessed/performed Overall Cognitive Status: Within Functional Limits for tasks assessed                       Exercises Total Joint Exercises Ankle Circles/Pumps: AROM;Both;10 reps;Supine Quad Sets: AROM;Both;10 reps;Supine Heel Slides: AAROM;Right;10 reps;Supine Hip ABduction/ADduction: AAROM;Right;10 reps;Supine Straight Leg Raises: AAROM;Right;10 reps;Supine Goniometric ROM: ~10-60 degrees    General Comments        Pertinent Vitals/Pain Pain Assessment: 0-10 Pain Score: 7  Pain Location: R knee with activity Pain Descriptors / Indicators: Sore Pain Intervention(s): Monitored during session;Ice applied;Repositioned    Home Living                      Prior Function            PT Goals (current goals can now be found in the care plan section) Progress towards PT goals: Progressing toward goals    Frequency  7X/week    PT Plan Current plan remains appropriate    Co-evaluation             End of Session Equipment Utilized During Treatment: Gait belt Activity Tolerance: Patient tolerated treatment well Patient left: in chair;with call bell/phone within reach;with family/visitor present     Time: [id number] PT Time Calculation (min) (ACUTE ONLY): 18 min  Charges:  $Gait Training: 8-22 mins                    G Codes:      Rebeca AlertJannie Ransom Nickson,  MPT Pager: [phone]

## 2015-11-27 LAB — BODY FLUID CULTURE
Culture: NO GROWTH
GRAM STAIN: NONE SEEN

## 2015-12-10 LAB — ANAEROBIC CULTURE

## 2016-02-16 ENCOUNTER — Ambulatory Visit: Payer: Medicare Other | Attending: Orthopedic Surgery | Admitting: Physical Therapy

## 2016-02-16 DIAGNOSIS — R600 Localized edema: Secondary | ICD-10-CM | POA: Diagnosis present

## 2016-02-16 DIAGNOSIS — M25561 Pain in right knee: Secondary | ICD-10-CM | POA: Insufficient documentation

## 2016-02-16 NOTE — Therapy (Signed)
[facility] [place], [place], [address] Phone: [phone]   Fax:  [phone]  Physical Therapy Treatment  Patient Details  Name: Andrea Norton MRN: [id number] Date of Birth: 13-Sep-1945 Referring Provider: Gaynelle Arabian MD  Encounter Date: 08/05/2015    Past Medical History:  Diagnosis Date  . Abnormal stress test 01-22-2009   LOW RISK ADENOSINE NUCLEAR STUDY W/ PROBABLE MILD APICAL THINNING BUT NO ISCHEMIA  . Arthritis    HANDS, KNEES  . B12 deficiency anemia   . Bronchitis    hx of   . Chronic back pain   . Chronic neck pain   . Coronary artery disease 2005   STRESS TEST /, NOte Dr [place] EPIC/ no cardiologist now--STATES [place] 2010  . DDD (degenerative disc disease), cervical   . DDD (degenerative disc disease), lumbar   . Depression    states well controlled  . Diabetes mellitus without complication ([place])    pt states since having gastric banding does not have take medication was on metformin in past  . Diverticulitis of colon   . GERD (gastroesophageal reflux disease)   . H/O hiatal hernia   . Hemorrhoid   . Hemorrhoids   . History of urinary tract infection   . Hyperlipidemia   . Hypertension      PCP Dr Edrick Oh; pt currently on no medication for treatment   . Hypothyroidism   . Inguinal hernia, right    with ventral hernia  . Pneumonia    hx of walking pneumonia  . Radicular pain in left arm OCCASIONAL PAIN SHOOTING DOWN LEFT ARM SECAONDARY TO  CERVICAL DEGENERATION  . Sleep apnea    couldnt tolerate machine- states last sleep study " many years ago"  . Status post primary angioplasty with coronary stent    DRUG-ELUTING STENT X1  TO PROXIMAL LAD  . Synovitis of knee HYPERTROPHIC RIGHT KNEE  . Urinary incontinence   . Vitamin D deficiency     Past Surgical History:  Procedure Laterality Date  . APPENDECTOMY  1993  . CATARACT EXTRACTION W/PHACO Right 05/06/2014   Procedure:  CATARACT EXTRACTION PHACO AND INTRAOCULAR LENS PLACEMENT (IOC);  Surgeon: Elta Guadeloupe T. Gershon Crane, MD;  Location: [facility];  Service: Ophthalmology;  Laterality: Right;  CDE 6.41  . CATARACT EXTRACTION W/PHACO Left 05/20/2014   Procedure: CATARACT EXTRACTION PHACO AND INTRAOCULAR LENS PLACEMENT (IOC);  Surgeon: Elta Guadeloupe T. Gershon Crane, MD;  Location: [facility];  Service: Ophthalmology;  Laterality: Left;  CDE:4.42  . COLON SURGERY    . CORONARY ANGIOPLASTY  2004   DRUG-ELUTING STENT X1 TO PROXIMAL  LAD  . EXCISIONAL TOTAL KNEE ARTHROPLASTY WITH ANTIBIOTIC SPACERS Right 09/14/2015   Procedure: RIGHT TOTAL KNEE RESSECTION ARTHROPLASTY WITH ANTIBIOTIC SPACERS;  Surgeon: Gaynelle Arabian, MD;  Location: [facility];  Service: Orthopedics;  Laterality: Right;  . EYE SURGERY     cataract surgery bilat   . HEMICOLECTOMY FOR DIVERTICULITIS  2005  . HYSTEROSCOPY W/D&C  2002  . I&D KNEE WITH POLY EXCHANGE  02/01/2012   Procedure: IRRIGATION AND DEBRIDEMENT KNEE WITH POLY EXCHANGE;  Surgeon: Gearlean Alf, MD;  Location: [facility];  Service: Orthopedics;  Laterality: Right;  Right Knee Polyethlene Revision   . IRRIGATION AND DEBRIDEMENT KNEE Right 03/10/2015   Procedure: IRRIGATION AND DEBRIDEMENT of right KNEE;  Surgeon: Gaynelle Arabian, MD;  Location: [facility];  Service: Orthopedics;  Laterality: Right;  . JOINT REPLACEMENT     right knee   . KNEE  ARTHROSCOPY  LEFT 1999   RIGHT 2004  . KNEE ARTHROSCOPY  08/03/2011   Procedure: ARTHROSCOPY KNEE;  Surgeon: Gearlean Alf;  Location: [facility];  Service: Orthopedics;  Laterality: Right;  . KNEE ARTHROSCOPY Right 12/17/2014   Procedure: RIGHT ARTHROSCOPY KNEE, SYNOVECTOMY;  Surgeon: Gaynelle Arabian, MD;  Location: [facility];  Service: Orthopedics;  Laterality: Right;  . KNEE ARTHROSCOPY W/ DEBRIDEMENT  01-22-2010   SEPTIC KNEE  . KNEE CLOSED REDUCTION  05-07-2009  RIGHT KNEE   LEFT KNEE  10-01-2009  . LAPAROSCOPIC GASTRIC SLEEVE RESECTION N/A 07/21/2014   Procedure:  LAPAROSCOPIC GASTRIC SLEEVE RESECTION, TAKEDOWN OF INCARCERATED VENTRAL HERNIA, ENTERAL LYSIS, UPPER ENDOSCOPY;  Surgeon: Pedro Earls, MD;  Location: [facility];  Service: General;  Laterality: N/A;  . LEFT FOOT SURG.  1990  . OPEN PATELLOFEMORAL RIGHT KNEE/ LATERAL RELEASE  05-04-2009  . QUADRICEPS TENDON REPAIR Right 06/02/2015   Procedure: RIGHT KNEE QUADRICEP TENDON REPAIR ;  Surgeon: Gaynelle Arabian, MD;  Location: [facility];  Service: Orthopedics;  Laterality: Right;  . RIGHT KNEE I & D POLYTHYLENE REVISION  02-04-2010   KNEE INFECTED  . SYNOVECTOMY  08/03/2011   Procedure: SYNOVECTOMY;  Surgeon: Gearlean Alf;  Location: [facility];  Service: Orthopedics;  Laterality: Right;  . TOTAL KNEE ARTHROPLASTY  LEFT  2005   RIGHT  03-13-2009  . TOTAL KNEE ARTHROPLASTY Right 11/23/2015   Procedure: RIGHT TOTAL KNEE ARTHROPLASTY REIMPLANTATION;  Surgeon: Gaynelle Arabian, MD;  [facility];  Service: Orthopedics;  Laterality: Right;  . TOTAL KNEE REVISION  RIGHT 09-30-2009   LEFT 11-25-2009  . TOTAL KNEE REVISION Right 02/20/2013   Procedure: RIGHT TOTAL KNEE ARTHROPLASTY REVISION VERSUS RESECTION ARTHROPLASTY;  Surgeon: Gearlean Alf, MD;  Location: [facility];  Service: Orthopedics;  Laterality: Right;  . TOTAL KNEE REVISION Right 04/02/2014   Procedure: RIGHT KNEE FEMORAL ARTHROPLASTY REVISION;  Surgeon: Gearlean Alf, MD;  Location: [facility];  Service: Orthopedics;  Laterality: Right;    There were no vitals filed for this visit.                                 PT Short Term Goals - 07/16/15 1745      PT SHORT TERM GOAL #1   Title I with initial HEP   Time 2   Period Weeks   Status New           PT Long Term Goals - 07/16/15 1745      PT LONG TERM GOAL #1   Title I with advanced HEP   Time 6   Period Weeks   Status New     PT LONG TERM GOAL #2   Title improved right knee flex to 115 to improve gait   Time 6   Period Weeks   Status New      PT LONG TERM GOAL #3   Title decreased pain in RLE by 50%   Period Weeks   Status New     PT LONG TERM GOAL #5   Title improve FOTO to CK   Time 6   Period Weeks   Status New             Patient will benefit from skilled therapeutic intervention in order to improve the following deficits and impairments:  Pain, Decreased activity tolerance, Increased edema, Decreased strength, Decreased range of motion  Visit Diagnosis:  Right knee pain  Knee swelling, right  Stiffness of right knee  Pain in right knee  Abnormality of gait  Debility  Generalized weakness     Problem List Patient Active Problem List   Diagnosis Date Noted  . OA (osteoarthritis) of knee 11/23/2015  . Infection of prosthetic right knee joint ([place]) 09/15/2015  . Septic arthritis of knee, right ([place]) 09/14/2015  . Quadriceps tendon rupture 06/02/2015  . Cellulitis of right knee 03/07/2015  . 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 Postoperative anemia due to acute blood loss 02/21/2013  . 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 Osteoarthritis 01/09/2009  . Hypothyroidism 01/06/2009  . Hyperlipidemia 01/06/2009  . OBESITY, MORBID 01/06/2009  . Essential hypertension 01/06/2009  . Coronary atherosclerosis 01/06/2009  . GASTROESOPHAGEAL REFLUX DISEASE 01/06/2009  . Sleep apnea 01/06/2009  . DYSPNEA 01/06/2009  . DIVERTICULITIS, HX OF 01/06/2009   PHYSICAL THERAPY DISCHARGE SUMMARY  Visits from Start of Care:  Current functional level related to goals / functional outcomes: Please see above.   Remaining deficits: Continued knee pain.   Education / Equipment: HEP. Plan: Patient agrees to discharge.  Patient goals were not met. Patient is being discharged due to lack of progress.  ?????      Ricardo Schubach, [place]  MPT 02/16/2016, 5:22 PM  [facility] [address] [place], [place], [address] Phone: [phone]   Fax:  [phone]  Name: ARETTA STETZEL MRN: [id number] Date of Birth: June 07, 1946

## 2016-02-16 NOTE — Therapy (Signed)
[facility] [address] [place], [place], [address] Phone: [phone]   Fax:  [phone]  Physical Therapy Evaluation  Patient Details  Name: Andrea Norton MRN: [id number] Date of Birth: [birth year] Referring Provider: Ollen Gross MD  Encounter Date: 02/16/2016      PT End of Session - 02/16/16 1724    Visit Number 1   Number of Visits 8   Date for PT Re-Evaluation 04/16/16   PT Start Time 0107   PT Stop Time 0155   PT Time Calculation (min) 48 min   Activity Tolerance Patient tolerated treatment well   Behavior During Therapy [facility] for tasks assessed/performed      Past Medical History:  Diagnosis Date  . Abnormal stress test 01-22-2009   LOW RISK ADENOSINE NUCLEAR STUDY W/ PROBABLE MILD APICAL THINNING BUT NO ISCHEMIA  . Arthritis    HANDS, KNEES  . B12 deficiency anemia   . Bronchitis    hx of   . Chronic back pain   . Chronic neck pain   . Coronary artery disease 2005   STRESS TEST /, NOte Dr Kirtland Bouchard EPIC/ no cardiologist now--STATES HEART STENT PLACED 2010  . DDD (degenerative disc disease), cervical   . DDD (degenerative disc disease), lumbar   . Depression    states well controlled  . Diabetes mellitus without complication (HCC)    pt states since having gastric banding does not have take medication was on metformin in past  . Diverticulitis of colon   . GERD (gastroesophageal reflux disease)   . H/O hiatal hernia   . Hemorrhoid   . Hemorrhoids   . History of urinary tract infection   . Hyperlipidemia   . Hypertension      PCP Dr Lysbeth Galas; pt currently on no medication for treatment   . Hypothyroidism   . Inguinal hernia, right    with ventral hernia  . Pneumonia    hx of walking pneumonia  . Radicular pain in left arm OCCASIONAL PAIN SHOOTING DOWN LEFT ARM SECAONDARY TO  CERVICAL DEGENERATION  . Sleep apnea    couldnt tolerate machine- states last sleep study " many years ago"  . Status post primary  angioplasty with coronary stent    DRUG-ELUTING STENT X1  TO PROXIMAL LAD  . Synovitis of knee HYPERTROPHIC RIGHT KNEE  . Urinary incontinence   . Vitamin D deficiency     Past Surgical History:  Procedure Laterality Date  . APPENDECTOMY  1993  . CATARACT EXTRACTION W/PHACO Right 05/06/2014   Procedure: CATARACT EXTRACTION PHACO AND INTRAOCULAR LENS PLACEMENT (IOC);  Surgeon: Loraine Leriche T. Nile Riggs, MD;  Location: [facility];  Service: Ophthalmology;  Laterality: Right;  CDE 6.41  . CATARACT EXTRACTION W/PHACO Left 05/20/2014   Procedure: CATARACT EXTRACTION PHACO AND INTRAOCULAR LENS PLACEMENT (IOC);  Surgeon: Loraine Leriche T. Nile Riggs, MD;  Location: [facility];  Service: Ophthalmology;  Laterality: Left;  CDE:4.42  . COLON SURGERY    . CORONARY ANGIOPLASTY  2004   DRUG-ELUTING STENT X1 TO PROXIMAL  LAD  . EXCISIONAL TOTAL KNEE ARTHROPLASTY WITH ANTIBIOTIC SPACERS Right 09/14/2015   Procedure: RIGHT TOTAL KNEE RESSECTION ARTHROPLASTY WITH ANTIBIOTIC SPACERS;  Surgeon: Ollen Gross, MD;  Location: [facility];  Service: Orthopedics;  Laterality: Right;  . EYE SURGERY     cataract surgery bilat   . HEMICOLECTOMY FOR DIVERTICULITIS  2005  . HYSTEROSCOPY W/D&C  2002  . I&D KNEE WITH POLY EXCHANGE  02/01/2012   Procedure: IRRIGATION AND DEBRIDEMENT KNEE WITH  POLY EXCHANGE;  Surgeon: Frank V Aluisio, MD;  LocaLoanne Drilling;  Service: Orthopedics;  Laterality: Right;  Right Knee Polyethlene Revision   . IRRIGATION AND DEBRIDEMENT KNEE Right 03/10/2015   Procedure: IRRIGATION AND DEBRIDEMENT of right KNEE;  Surgeon: Ollen Gross, MD;  [facility]: [facility];  Service: Orthopedics;  Laterality: Right;  . JOINT REPLACEMENT     right knee   . KNEE ARTHROSCOPY  LEFT 1999   RIGHT 2004  . KNEE ARTHROSCOPY  08/03/2011   Procedure: ARTHROSCOPY KNEE;  Surgeon: Loanne Drilling;  Location: [facility];  Service: Orthopedics;  Laterality: Right;  . KNEE ARTHROSCOPY Right 12/17/2014   Procedure: RIGHT ARTHROSCOPY KNEE,  SYNOVECTOMY;  Surgeon: Ollen Gross, MD;  [facility];  Service: Orthopedics;  Laterality: Right;  . KNEE ARTHROSCOPY W/ DEBRIDEMENT  01-22-2010   SEPTIC KNEE  . KNEE CLOSED REDUCTION  05-07-2009  RIGHT KNEE   LEFT KNEE  10-01-2009  . LAPAROSCOPIC GASTRIC SLEEVE RESECTION N/A 07/21/2014   Procedure: LAPAROSCOPIC GASTRIC SLEEVE RESECTION, TAKEDOWN OF INCARCERATED VENTRAL HERNIA, ENTERAL LYSIS, UPPER ENDOSCOPY;  Surgeon: Valarie Merino, MD;  Location: [facility];  Service: General;  Laterality: N/A;  . LEFT FOOT SURG.  1990  . OPEN PATELLOFEMORAL RIGHT KNEE/ LATERAL RELEASE  05-04-2009  . QUADRICEPS TENDON REPAIR Right 06/02/2015   Procedure: RIGHT KNEE QUADRICEP TENDON REPAIR ;  Surgeon: Ollen Gross, MD;  [facility];  Service: Orthopedics;  Laterality: Right;  . RIGHT KNEE I & D POLYTHYLENE REVISION  02-04-2010   KNEE INFECTED  . SYNOVECTOMY  08/03/2011   Procedure: SYNOVECTOMY;  Surgeon: Loanne Drilling;  Location:  SUR[facility]ERY CENTER;  Service: Orthopedics;  Laterality: Right;  . TOTAL KNEE ARTHROPLASTY  LEFT  2005   RIGHT  03-13-2009  . TOTAL KNEE ARTHROPLASTY Right 11/23/2015   Procedure: RIGHT TOTAL KNEE ARTHROPLASTY REIMPLANTATION;  Surgeon: Ollen Gross, MD;  [facility];  Service: Orthopedics;  Laterality: Right;  . TOTAL KNEE REVISION  RIGHT 09-30-2009   LEFT 11-25-2009  . TOTAL KNEE REVISION Right 02/20/2013   Procedure: RIGHT TOTAL KNEE ARTHROPLASTY REVISION VERSUS RESECTION ARTHROPLASTY;  Surgeon: Loanne Drilling, MD;  [facility];  Service: Orthopedics;  Laterality: Right;  . TOTAL KNEE REVISION Right 04/02/2014   Procedure: RIGHT KNEE FEMORAL ARTHROPLASTY REVISION;  Surgeon: Loanne Drilling, MD;  [facility];  Service: Orthopedics;  Laterality: Right;    There were no vitals filed for this visit.       Subjective Assessment - 02/16/16 1727    Subjective Dr. Lequita Halt wants me to have that stim again to get my quad firing.   Limitations Standing    How long can you stand comfortably? 10 minutes.   Patient Stated Goals Use right leg like normal again.   Pain Score 8    Pain Location Knee   Pain Orientation Right   Pain Descriptors / Indicators Stabbing   Pain Type Surgical pain   Pain Onset More than a month ago   Pain Frequency Constant   Aggravating Factors  Constant.   Pain Relieving Factors Rest.            [facility] PT Assessment - 02/16/16 0001      Assessment   Medical Diagnosis Right total knee revision.   Onset Date/Surgical Date --  11/23/15 (surgery date).     Precautions   Precautions --  No ultrasound.     Restrictions   Weight Bearing Restrictions No  Balance Screen   Has the patient fallen in the past 6 months Yes   How many times? --  1   Has the patient had a decrease in activity level because of a fear of falling?  Yes   Is the patient reluctant to leave their home because of a fear of falling?  Yes     Home Environment   Living Environment Private residence     Prior Function   Level of Independence Independent     Circumferential Edema   Circumferential - Right RT 6 cms > LT.     ROM / Strength   AROM / PROM / Strength AROM     AROM   Overall AROM Comments Full right knee extension in supine and active flexion= 120 degrees.  However, she exhibits a 35 degrees extensor lag.     Strength   Right Hip Flexion --  2/5.   Right Hip ABduction --  4/5.   Right Knee Flexion --  4/5.   Right Knee Extension --  1+ to 2-/5.  Minimal volitional rt quad activation.     Palpation   Palpation comment Pain c/o around right kneecap.     Ambulation/Gait   Gait Pattern Decreased stance time - right   Gait Comments Patient using a straight cane.                   [facility] Adult PT Treatment/Exercise - 02/16/16 0001      Exercises   Exercises Knee/Hip     Knee/Hip Exercises: Supine   Quad Sets Limitations 4 electrode Russian (10 sec on 10 sec off) x 15 minutes with patient  performing quadriceps.                  PT Short Term Goals - 02/16/16 1750      PT SHORT TERM GOAL #1   Title I with initial HEP   Time 2   Period Weeks   Status New           PT Long Term Goals - 02/16/16 1750      PT LONG TERM GOAL #1   Title I with advanced HEP   Time 4   Period Weeks   Status New     PT LONG TERM GOAL #2   Title Extensor lag not > 10 degrees.   Time 4   Period Weeks   Status New     PT LONG TERM GOAL #3   Title decreased pain in RLE by 50%   Time 4   Period Weeks   Status New               Plan - 02/16/16 1740    Clinical Impression Statement The patient underwent a right knee revision surgery on 11/23/15.  She reports a pain-level of 8/10.  Her goal is no get her ight quadriceps to fire.  He has a significant extensor lag and her knee swells and she has had it aspirated.   Rehab Potential Fair   PT Frequency 2x / week   PT Duration 4 weeks   PT Treatment/Interventions ADLs/Self Care Home Management;Cryotherapy;Electrical Stimulation;Therapeutic exercise;Therapeutic activities;Neuromuscular re-education;Patient/family education;Passive range of motion;Manual techniques;Vasopneumatic Device   PT Next Visit Plan Russian e'stim to right quadriceps; Ther ex to overcome extensor lag.   Consulted and Agree with Plan of Care Patient      Patient will benefit from skilled therapeutic intervention in order to improve the following deficits  and impairments:  Pain, Decreased activity tolerance, Increased edema, Decreased strength, Decreased range of motion  Visit Diagnosis: Right knee pain - Plan: PT plan of care cert/re-cert  Localized edema - Plan: PT plan of care cert/re-cert      G-Codes - 03-06-2016 1751    Functional Assessment Tool Used FOTO......59% limitation.   Functional Limitation Mobility: Walking and moving around   Mobility: Walking and Moving Around Current Status [phone]) At least 40 percent but less than 60 percent  impaired, limited or restricted   Mobility: Walking and Moving Around Goal Status [phone]) At least 20 percent but less than 40 percent impaired, limited or restricted       Problem List Patient Active Problem List   Diagnosis Date Noted  . OA (osteoarthritis) of knee 11/23/2015  . 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 Septic arthritis of knee, right (HCC) 09/14/2015  . Quadriceps tendon rupture 06/02/2015  . Cellulitis of right knee 03/07/2015  . 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 Postoperative anemia due to acute blood loss 02/21/2013  . 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 Instability of prosthetic knee (HCC) 02/01/2012  . Osteoarthritis 01/09/2009  . Hypothyroidism 01/06/2009  . Hyperlipidemia 01/06/2009  . OBESITY, MORBID 01/06/2009  . Essential hypertension 01/06/2009  . Coronary atherosclerosis 01/06/2009  . GASTROESOPHAGEAL REFLUX DISEASE 01/06/2009  . Sleep apnea 01/06/2009  . DYSPNEA 01/06/2009  . DIVERTICULITIS, HX OF 01/06/2009    Berneice Zettlemoyer, [place] MPT 03-06-2016, 5:58 PM  [facility] [address] [place], [place], [address] Phone: [phone]   Fax:  [phone]  Name: Andrea Norton MRN: [id number] Date of Birth: 1946/06/13

## 2016-02-19 ENCOUNTER — Encounter: Payer: Medicare Other | Admitting: *Deleted

## 2016-02-22 ENCOUNTER — Encounter: Payer: Medicare Other | Admitting: Physical Therapy

## 2016-02-24 ENCOUNTER — Ambulatory Visit: Payer: Medicare Other | Attending: Orthopedic Surgery | Admitting: Physical Therapy

## 2016-02-24 ENCOUNTER — Encounter: Payer: Self-pay | Admitting: Physical Therapy

## 2016-02-24 DIAGNOSIS — M25561 Pain in right knee: Secondary | ICD-10-CM

## 2016-02-24 DIAGNOSIS — R600 Localized edema: Secondary | ICD-10-CM | POA: Diagnosis present

## 2016-02-24 NOTE — Therapy (Signed)
[facility] [address] [place], [place], [address] Phone: [phone]   Fax:  [phone]  Physical Therapy Treatment  Patient Details  Name: Andrea Norton MRN: [id number] Date of Birth: [birth year] Referring Provider: Ollen Gross MD  Encounter Date: 02/24/2016      PT End of Session - 02/24/16 1259    Visit Number 2   Number of Visits 8   Date for PT Re-Evaluation 04/16/16   PT Start Time 1234   PT Stop Time 1315   PT Time Calculation (min) 41 min   Activity Tolerance Patient tolerated treatment well   Behavior During Therapy [facility] for tasks assessed/performed      Past Medical History:  Diagnosis Date  . Abnormal stress test 01-22-2009   LOW RISK ADENOSINE NUCLEAR STUDY W/ PROBABLE MILD APICAL THINNING BUT NO ISCHEMIA  . Arthritis    HANDS, KNEES  . B12 deficiency anemia   . Bronchitis    hx of   . Chronic back pain   . Chronic neck pain   . Coronary artery disease 2005   STRESS TEST /, NOte Dr Kirtland Bouchard EPIC/ no cardiologist now--STATES HEART STENT PLACED 2010  . DDD (degenerative disc disease), cervical   . DDD (degenerative disc disease), lumbar   . Depression    states well controlled  . Diabetes mellitus without complication (HCC)    pt states since having gastric banding does not have take medication was on metformin in past  . Diverticulitis of colon   . GERD (gastroesophageal reflux disease)   . H/O hiatal hernia   . Hemorrhoid   . Hemorrhoids   . History of urinary tract infection   . Hyperlipidemia   . Hypertension      PCP Dr Lysbeth Galas; pt currently on no medication for treatment   . Hypothyroidism   . Inguinal hernia, right    with ventral hernia  . Pneumonia    hx of walking pneumonia  . Radicular pain in left arm OCCASIONAL PAIN SHOOTING DOWN LEFT ARM SECAONDARY TO  CERVICAL DEGENERATION  . Sleep apnea    couldnt tolerate machine- states last sleep study " many years ago"  . Status post primary  angioplasty with coronary stent    DRUG-ELUTING STENT X1  TO PROXIMAL LAD  . Synovitis of knee HYPERTROPHIC RIGHT KNEE  . Urinary incontinence   . Vitamin D deficiency     Past Surgical History:  Procedure Laterality Date  . APPENDECTOMY  1993  . CATARACT EXTRACTION W/PHACO Right 05/06/2014   Procedure: CATARACT EXTRACTION PHACO AND INTRAOCULAR LENS PLACEMENT (IOC);  Surgeon: Loraine Leriche T. Nile Riggs, MD;  Location: [facility];  Service: Ophthalmology;  Laterality: Right;  CDE 6.41  . CATARACT EXTRACTION W/PHACO Left 05/20/2014   Procedure: CATARACT EXTRACTION PHACO AND INTRAOCULAR LENS PLACEMENT (IOC);  Surgeon: Loraine Leriche T. Nile Riggs, MD;  Location: [facility];  Service: Ophthalmology;  Laterality: Left;  CDE:4.42  . COLON SURGERY    . CORONARY ANGIOPLASTY  2004   DRUG-ELUTING STENT X1 TO PROXIMAL  LAD  . EXCISIONAL TOTAL KNEE ARTHROPLASTY WITH ANTIBIOTIC SPACERS Right 09/14/2015   Procedure: RIGHT TOTAL KNEE RESSECTION ARTHROPLASTY WITH ANTIBIOTIC SPACERS;  Surgeon: Ollen Gross, MD;  [facility]: [facility];  Service: Orthopedics;  Laterality: Right;  . EYE SURGERY     cataract surgery bilat   . HEMICOLECTOMY FOR DIVERTICULITIS  2005  . HYSTEROSCOPY W/D&C  2002  . I&D KNEE WITH POLY EXCHANGE  02/01/2012   Procedure: IRRIGATION AND DEBRIDEMENT KNEE WITH  POLY EXCHANGE;  Surgeon: Loanne Drilling, MD;  [facility]: [facility];  Service: Orthopedics;  Laterality: Right;  Right Knee Polyethlene Revision   . IRRIGATION AND DEBRIDEMENT KNEE Right 03/10/2015   Procedure: IRRIGATION AND DEBRIDEMENT of right KNEE;  Surgeon: Ollen Gross, MD;  [facility]: [facility];  Service: Orthopedics;  Laterality: Right;  . JOINT REPLACEMENT     right knee   . KNEE ARTHROSCOPY  LEFT 1999   RIGHT 2004  . KNEE ARTHROSCOPY  08/03/2011   Procedure: ARTHROSCOPY KNEE;  Surgeon: Loanne Drilling;  Location: [facility];  Service: Orthopedics;  Laterality: Right;  . KNEE ARTHROSCOPY Right 12/17/2014   Procedure: RIGHT ARTHROSCOPY KNEE,  SYNOVECTOMY;  Surgeon: Ollen Gross, MD;  [facility];  Service: Orthopedics;  Laterality: Right;  . KNEE ARTHROSCOPY W/ DEBRIDEMENT  01-22-2010   SEPTIC KNEE  . KNEE CLOSED REDUCTION  05-07-2009  RIGHT KNEE   LEFT KNEE  10-01-2009  . LAPAROSCOPIC GASTRIC SLEEVE RESECTION N/A 07/21/2014   Procedure: LAPAROSCOPIC GASTRIC SLEEVE RESECTION, TAKEDOWN OF INCARCERATED VENTRAL HERNIA, ENTERAL LYSIS, UPPER ENDOSCOPY;  Surgeon: Valarie Merino, MD;  [facility];  Service: General;  Laterality: N/A;  . LEFT FOOT SURG.  1990  . OPEN PATELLOFEMORAL RIGHT KNEE/ LATERAL RELEASE  05-04-2009  . QUADRICEPS TENDON REPAIR Right 06/02/2015   Procedure: RIGHT KNEE QUADRICEP TENDON REPAIR ;  Surgeon: Ollen Gross, MD;  [facility];  Service: Orthopedics;  Laterality: Right;  . RIGHT KNEE I & D POLYTHYLENE REVISION  02-04-2010   KNEE INFECTED  . SYNOVECTOMY  08/03/2011   Procedure: SYNOVECTOMY;  Surgeon: Loanne Drilling;  Location: [facility];  Service: Orthopedics;  Laterality: Right;  . TOTAL KNEE ARTHROPLASTY  LEFT  2005   RIGHT  03-13-2009  . TOTAL KNEE ARTHROPLASTY Right 11/23/2015   Procedure: RIGHT TOTAL KNEE ARTHROPLASTY REIMPLANTATION;  Surgeon: Ollen Gross, MD;  [facility];  Service: Orthopedics;  Laterality: Right;  . TOTAL KNEE REVISION  RIGHT 09-30-2009   LEFT 11-25-2009  . TOTAL KNEE REVISION Right 02/20/2013   Procedure: RIGHT TOTAL KNEE ARTHROPLASTY REVISION VERSUS RESECTION ARTHROPLASTY;  Surgeon: Loanne Drilling, MD;  [facility];  Service: Orthopedics;  Laterality: Right;  . TOTAL KNEE REVISION Right 04/02/2014   Procedure: RIGHT KNEE FEMORAL ARTHROPLASTY REVISION;  Surgeon: Loanne Drilling, MD;  [facility];  Service: Orthopedics;  Laterality: Right;    There were no vitals filed for this visit.      Subjective Assessment - 02/24/16 1243    Subjective I feel ok, I just wish I could walk without my cane   Patient Stated Goals Use right leg like  normal again.   Currently in Pain? No/denies                         [facility] Adult PT Treatment/Exercise - 02/24/16 0001      Knee/Hip Exercises: Supine   Quad Sets Right;1 set;10 reps  with tactile cues   Short Arc Quad Sets Limitations attempt SAQ, pt unable to perform without assist     Modalities   Modalities Doctor, general practice Location Rt quad   Electrical Stimulation Action russian   Electrical Stimulation Parameters 10 sec on 10 sec off x 15 mins   Electrical Stimulation Goals Strength     Vasopneumatic   Number Minutes Vasopneumatic  15 minutes   Vasopnuematic Location  Knee   Vasopneumatic Pressure Medium   Vasopneumatic Temperature  3 flakes                  PT Short Term Goals - 02/24/16 1301      PT SHORT TERM GOAL #1   Title I with initial HEP   Status On-going           PT Long Term Goals - 02/24/16 1301      PT LONG TERM GOAL #1   Title I with advanced HEP   Status On-going     PT LONG TERM GOAL #2   Title Extensor lag not > 10 degrees.   Status On-going     PT LONG TERM GOAL #3   Title decreased pain in RLE by 50%   Status On-going     PT LONG TERM GOAL #4   Title demo improved RLE strength to 4+/5 or better to improve function.   Status On-going     PT LONG TERM GOAL #5   Title improve FOTO to CK   Status On-going               Plan - 02/24/16 1300    Clinical Impression Statement Pt tolerated estim well, able to activate Rt quad but unable to perform SAQ without assist.  she continues with Rt knee edema   Rehab Potential Fair   PT Frequency 2x / week   PT Duration 4 weeks   PT Treatment/Interventions ADLs/Self Care Home Management;Cryotherapy;Electrical Stimulation;Therapeutic exercise;Therapeutic activities;Neuromuscular re-education;Patient/family education;Passive range of motion;Manual techniques;Vasopneumatic Device   PT Next Visit Plan  Russian e'stim to right quadriceps; Ther ex to overcome extensor lag, vaso   Consulted and Agree with Plan of Care Patient      Patient will benefit from skilled therapeutic intervention in order to improve the following deficits and impairments:  Pain, Decreased activity tolerance, Increased edema, Decreased strength, Decreased range of motion  Visit Diagnosis: Right knee pain  Localized edema     Problem List Patient Active Problem List   Diagnosis Date Noted  . OA (osteoarthritis) of knee 11/23/2015  . 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 Septic arthritis of knee, right (HCC) 09/14/2015  . Quadriceps tendon rupture 06/02/2015  . Cellulitis of right knee 03/07/2015  . 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 Postoperative anemia due to acute blood loss 02/21/2013  . 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 Instability of prosthetic knee (HCC) 02/01/2012  . Osteoarthritis 01/09/2009  . Hypothyroidism 01/06/2009  . Hyperlipidemia 01/06/2009  . OBESITY, MORBID 01/06/2009  . Essential hypertension 01/06/2009  . Coronary atherosclerosis 01/06/2009  . GASTROESOPHAGEAL REFLUX DISEASE 01/06/2009  . Sleep apnea 01/06/2009  . DYSPNEA 01/06/2009  . DIVERTICULITIS, HX OF 01/06/2009    Reggy Eye, PT, DPT 02/24/2016, 1:03 PM  [facility] [address] [place], [place], [address] Phone: [phone]   Fax:  [phone]  Name: VYSHNAVI CIFELLI MRN: [id number] Date of Birth: Aug 11, 1945

## 2016-02-25 ENCOUNTER — Ambulatory Visit: Payer: Medicare Other | Admitting: Physical Therapy

## 2016-02-25 ENCOUNTER — Encounter: Payer: Self-pay | Admitting: Physical Therapy

## 2016-02-25 DIAGNOSIS — R600 Localized edema: Secondary | ICD-10-CM

## 2016-02-25 DIAGNOSIS — M25561 Pain in right knee: Secondary | ICD-10-CM

## 2016-02-25 NOTE — Patient Instructions (Addendum)
Quad Set: Slight Flexion   May use ball or pillow to squeeze inside of knee at the same time as you tense muscles on top of left thigh. Hold __10__ seconds. Repeat __10-40__ times per set. Do __2-3__ sets per session. Do _2-4___ sessions per day.  http://orth.exer.us/678   Copyright  VHI. All rights reserved.

## 2016-02-25 NOTE — Therapy (Signed)
[facility] [place], [place], [address] Phone: [phone]   Fax:  [phone]  Physical Therapy Treatment  Patient Details  Name: Andrea Norton MRN: [id number] Date of Birth: 1946-02-21 Referring Provider: Gaynelle Arabian MD  Encounter Date: 02/25/2016      PT End of Session - 02/25/16 1259    Visit Number 3   Number of Visits 8   Date for PT Re-Evaluation 04/16/16   PT Start Time 1230   PT Stop Time 1318   PT Time Calculation (min) 48 min   Activity Tolerance Patient tolerated treatment well   Behavior During Therapy [facility] for tasks assessed/performed      Past Medical History:  Diagnosis Date  . Abnormal stress test 01-22-2009   LOW RISK ADENOSINE NUCLEAR STUDY W/ PROBABLE MILD APICAL THINNING BUT NO ISCHEMIA  . Arthritis    HANDS, KNEES  . B12 deficiency anemia   . Bronchitis    hx of   . Chronic back pain   . Chronic neck pain   . Coronary artery disease 2005   STRESS TEST /, NOte Dr [place] EPIC/ no cardiologist now--STATES [place] 2010  . DDD (degenerative disc disease), cervical   . DDD (degenerative disc disease), lumbar   . Depression    states well controlled  . Diabetes mellitus without complication ([place])    pt states since having gastric banding does not have take medication was on metformin in past  . Diverticulitis of colon   . GERD (gastroesophageal reflux disease)   . H/O hiatal hernia   . Hemorrhoid   . Hemorrhoids   . History of urinary tract infection   . Hyperlipidemia   . Hypertension      PCP Dr Edrick Oh; pt currently on no medication for treatment   . Hypothyroidism   . Inguinal hernia, right    with ventral hernia  . Pneumonia    hx of walking pneumonia  . Radicular pain in left arm OCCASIONAL PAIN SHOOTING DOWN LEFT ARM SECAONDARY TO  CERVICAL DEGENERATION  . Sleep apnea    couldnt tolerate machine- states last sleep study " many years ago"  . Status post primary  angioplasty with coronary stent    DRUG-ELUTING STENT X1  TO PROXIMAL LAD  . Synovitis of knee HYPERTROPHIC RIGHT KNEE  . Urinary incontinence   . Vitamin D deficiency     Past Surgical History:  Procedure Laterality Date  . APPENDECTOMY  1993  . CATARACT EXTRACTION W/PHACO Right 05/06/2014   Procedure: CATARACT EXTRACTION PHACO AND INTRAOCULAR LENS PLACEMENT (IOC);  Surgeon: Elta Guadeloupe T. Gershon Crane, MD;  Location: [facility];  Service: Ophthalmology;  Laterality: Right;  CDE 6.41  . CATARACT EXTRACTION W/PHACO Left 05/20/2014   Procedure: CATARACT EXTRACTION PHACO AND INTRAOCULAR LENS PLACEMENT (IOC);  Surgeon: Elta Guadeloupe T. Gershon Crane, MD;  Location: [facility];  Service: Ophthalmology;  Laterality: Left;  CDE:4.42  . COLON SURGERY    . CORONARY ANGIOPLASTY  2004   DRUG-ELUTING STENT X1 TO PROXIMAL  LAD  . EXCISIONAL TOTAL KNEE ARTHROPLASTY WITH ANTIBIOTIC SPACERS Right 09/14/2015   Procedure: RIGHT TOTAL KNEE RESSECTION ARTHROPLASTY WITH ANTIBIOTIC SPACERS;  Surgeon: Gaynelle Arabian, MD;  [facility];  Service: Orthopedics;  Laterality: Right;  . EYE SURGERY     cataract surgery bilat   . HEMICOLECTOMY FOR DIVERTICULITIS  2005  . HYSTEROSCOPY W/D&C  2002  . I&D KNEE WITH POLY EXCHANGE  02/01/2012   Procedure: IRRIGATION AND DEBRIDEMENT KNEE WITH  POLY EXCHANGE;  Surgeon: Gearlean Alf, MD;  [facility]: [facility];  Service: Orthopedics;  Laterality: Right;  Right Knee Polyethlene Revision   . IRRIGATION AND DEBRIDEMENT KNEE Right 03/10/2015   Procedure: IRRIGATION AND DEBRIDEMENT of right KNEE;  Surgeon: Gaynelle Arabian, MD;  [facility];  Service: Orthopedics;  Laterality: Right;  . JOINT REPLACEMENT     right knee   . KNEE ARTHROSCOPY  LEFT 1999   RIGHT 2004  . KNEE ARTHROSCOPY  08/03/2011   Procedure: ARTHROSCOPY KNEE;  Surgeon: Gearlean Alf;  Location: [place];  Service: Orthopedics;  Laterality: Right;  . KNEE ARTHROSCOPY Right 12/17/2014   Procedure: RIGHT ARTHROSCOPY KNEE,  SYNOVECTOMY;  Surgeon: Gaynelle Arabian, MD;  [facility];  Service: Orthopedics;  Laterality: Right;  . KNEE ARTHROSCOPY W/ DEBRIDEMENT  01-22-2010   SEPTIC KNEE  . KNEE CLOSED REDUCTION  05-07-2009  RIGHT KNEE   LEFT KNEE  10-01-2009  . LAPAROSCOPIC GASTRIC SLEEVE RESECTION N/A 07/21/2014   Procedure: LAPAROSCOPIC GASTRIC SLEEVE RESECTION, TAKEDOWN OF INCARCERATED VENTRAL HERNIA, ENTERAL LYSIS, UPPER ENDOSCOPY;  Surgeon: Pedro Earls, MD;  [facility];  Service: General;  Laterality: N/A;  . LEFT FOOT SURG.  1990  . OPEN PATELLOFEMORAL RIGHT KNEE/ LATERAL RELEASE  05-04-2009  . QUADRICEPS TENDON REPAIR Right 06/02/2015   Procedure: RIGHT KNEE QUADRICEP TENDON REPAIR ;  Surgeon: Gaynelle Arabian, MD;  [facility];  Service: Orthopedics;  Laterality: Right;  . RIGHT KNEE I & D POLYTHYLENE REVISION  02-04-2010   KNEE INFECTED  . SYNOVECTOMY  08/03/2011   Procedure: SYNOVECTOMY;  Surgeon: Gearlean Alf;  Location: [place];  Service: Orthopedics;  Laterality: Right;  . TOTAL KNEE ARTHROPLASTY  LEFT  2005   RIGHT  03-13-2009  . TOTAL KNEE ARTHROPLASTY Right 11/23/2015   Procedure: RIGHT TOTAL KNEE ARTHROPLASTY REIMPLANTATION;  Surgeon: Gaynelle Arabian, MD;  [facility];  Service: Orthopedics;  Laterality: Right;  . TOTAL KNEE REVISION  RIGHT 09-30-2009   LEFT 11-25-2009  . TOTAL KNEE REVISION Right 02/20/2013   Procedure: RIGHT TOTAL KNEE ARTHROPLASTY REVISION VERSUS RESECTION ARTHROPLASTY;  Surgeon: Gearlean Alf, MD;  [facility];  Service: Orthopedics;  Laterality: Right;  . TOTAL KNEE REVISION Right 04/02/2014   Procedure: RIGHT KNEE FEMORAL ARTHROPLASTY REVISION;  Surgeon: Gearlean Alf, MD;  [facility];  Service: Orthopedics;  Laterality: Right;    There were no vitals filed for this visit.      Subjective Assessment - 02/25/16 1233    Subjective Patient reported feeling no pain only weakness in knee   Limitations Standing   How long can  you stand comfortably? 10 minutes.   Patient Stated Goals Use right leg like normal again.   Currently in Pain? No/denies                         [facility] Adult PT Treatment/Exercise - 02/25/16 0001      Knee/Hip Exercises: Aerobic   Nustep x45mn L4 UE/LE     EAcupuncturistLocation Rt quad/VMO   EScientist, research (life sciences)Parameters 10x10 x 122m with quad set using towel roll and ball squeeze for activation   Electrical Stimulation Goals Strength     Vasopneumatic   Number Minutes Vasopneumatic  15 minutes   Vasopnuematic Location  Knee   Vasopneumatic Pressure Medium  PT Education - 02/25/16 1259    Education provided Yes   Education Details HEP   Person(s) Educated Patient   Methods Explanation;Demonstration;Handout   Comprehension Verbalized understanding;Returned demonstration          PT Short Term Goals - 02/25/16 1259      PT SHORT TERM GOAL #1   Title I with initial HEP   Time 2   Period Weeks   Status Achieved           PT Long Term Goals - 02/24/16 1301      PT LONG TERM GOAL #1   Title I with advanced HEP   Status On-going     PT LONG TERM GOAL #2   Title Extensor lag not > 10 degrees.   Status On-going     PT LONG TERM GOAL #3   Title decreased pain in RLE by 50%   Status On-going     PT LONG TERM GOAL #4   Title demo improved RLE strength to 4+/5 or better to improve function.   Status On-going     PT LONG TERM GOAL #5   Title improve FOTO to CK   Status On-going               Plan - 02/25/16 1300    Clinical Impression Statement Patient progressing slowly due to ongoing quad weakness. Today attempted other exercises including SAQ, LAQ with eccentric control and patient unable to perform. Patient started nustep today with good response and [place] with quad set and ball squeeze for activation. Patient had a strong  muscle contraction with exercise today and was able to perform without fatique. HEP given today and patient independent. Met STG#1 met and LTG's ongoing due to strength deficit in right knee.   Rehab Potential Fair   PT Frequency 2x / week   PT Duration 4 weeks   PT Treatment/Interventions ADLs/Self Care Home Management;Cryotherapy;Electrical Stimulation;Therapeutic exercise;Therapeutic activities;Neuromuscular re-education;Patient/family education;Passive range of motion;Manual techniques;Vasopneumatic Device   PT Next Visit Plan [place] e'stim to right quadriceps/nustep per MPT; Ther ex to overcome extensor lag, vaso   Consulted and Agree with Plan of Care Patient      Patient will benefit from skilled therapeutic intervention in order to improve the following deficits and impairments:  Pain, Decreased activity tolerance, Increased edema, Decreased strength, Decreased range of motion  Visit Diagnosis: Right knee pain  Localized edema     Problem List Patient Active Problem List   Diagnosis Date Noted  . OA (osteoarthritis) of knee 11/23/2015  . Infection of prosthetic right knee joint ([place]) 09/15/2015  . Septic arthritis of knee, right ([place]) 09/14/2015  . Quadriceps tendon rupture 06/02/2015  . Cellulitis of right knee 03/07/2015  . 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 Postoperative anemia due to acute blood loss 02/21/2013  . 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 Instability of prosthetic knee ([place]) 02/01/2012  . Osteoarthritis 01/09/2009  . Hypothyroidism 01/06/2009  . Hyperlipidemia 01/06/2009  . OBESITY, MORBID 01/06/2009  . Essential hypertension 01/06/2009  . Coronary atherosclerosis 01/06/2009  . GASTROESOPHAGEAL REFLUX DISEASE 01/06/2009  . Sleep apnea 01/06/2009  . DYSPNEA 01/06/2009  . DIVERTICULITIS, HX OF 01/06/2009    Bay Wayson P,  PTA 02/25/2016, 1:53 PM  [facility] [place], [place], [address] Phone: [phone]   Fax:  [phone]  Name: Andrea Norton MRN: [id number]  Date of Birth: 1945/08/25

## 2016-02-26 ENCOUNTER — Ambulatory Visit: Payer: Medicare Other | Admitting: Physical Therapy

## 2016-03-01 ENCOUNTER — Ambulatory Visit: Payer: Medicare Other | Admitting: *Deleted

## 2016-03-01 DIAGNOSIS — M25561 Pain in right knee: Secondary | ICD-10-CM | POA: Diagnosis not present

## 2016-03-01 DIAGNOSIS — R600 Localized edema: Secondary | ICD-10-CM

## 2016-03-01 NOTE — Therapy (Signed)
[facility] [address] [place], [place], [address] Phone: [phone]   Fax:  [phone]  Physical Therapy Treatment  Patient Details  Name: Andrea NgGloria O Norton MRN: [id number] Date of Birth: [birth year] Referring Provider: Ollen GrossFrank Aluisio MD  Encounter Date: 03/01/2016      PT End of Session - 03/01/16 1308    Visit Number 4   Number of Visits 8   Date for PT Re-Evaluation 04/16/16   PT Start Time 1300   PT Stop Time 1351   PT Time Calculation (min) 51 min      Past Medical History:  Diagnosis Date  . Abnormal stress test 01-22-2009   LOW RISK ADENOSINE NUCLEAR STUDY W/ PROBABLE MILD APICAL THINNING BUT NO ISCHEMIA  . Arthritis    HANDS, KNEES  . B12 deficiency anemia   . Bronchitis    hx of   . Chronic back pain   . Chronic neck pain   . Coronary artery disease 2005   STRESS TEST /, NOte Dr [place] EPIC/ no cardiologist now--STATES [place] 2010  . DDD (degenerative disc disease), cervical   . DDD (degenerative disc disease), lumbar   . Depression    states well controlled  . Diabetes mellitus without complication (HCC)    pt states since having gastric banding does not have take medication was on metformin in past  . Diverticulitis of colon   . GERD (gastroesophageal reflux disease)   . H/O hiatal hernia   . Hemorrhoid   . Hemorrhoids   . History of urinary tract infection   . Hyperlipidemia   . Hypertension      PCP Dr Lysbeth GalasNyland; pt currently on no medication for treatment   . Hypothyroidism   . Inguinal hernia, right    with ventral hernia  . Pneumonia    hx of walking pneumonia  . Radicular pain in left arm OCCASIONAL PAIN SHOOTING DOWN LEFT ARM SECAONDARY TO  CERVICAL DEGENERATION  . Sleep apnea    couldnt tolerate machine- states last sleep study " many years ago"  . Status post primary angioplasty with coronary stent    DRUG-ELUTING STENT X1  TO PROXIMAL LAD  . Synovitis of knee HYPERTROPHIC RIGHT  KNEE  . Urinary incontinence   . Vitamin D deficiency     Past Surgical History:  Procedure Laterality Date  . APPENDECTOMY  1993  . CATARACT EXTRACTION W/PHACO Right 05/06/2014   Procedure: CATARACT EXTRACTION PHACO AND INTRAOCULAR LENS PLACEMENT (IOC);  Surgeon: Loraine LericheMark T. Nile RiggsShapiro, MD;  Location: [facility];  Service: Ophthalmology;  Laterality: Right;  CDE 6.41  . CATARACT EXTRACTION W/PHACO Left 05/20/2014   Procedure: CATARACT EXTRACTION PHACO AND INTRAOCULAR LENS PLACEMENT (IOC);  Surgeon: Loraine LericheMark T. Nile RiggsShapiro, MD;  Location: [facility];  Service: Ophthalmology;  Laterality: Left;  CDE:4.42  . COLON SURGERY    . CORONARY ANGIOPLASTY  2004   DRUG-ELUTING STENT X1 TO PROXIMAL  LAD  . EXCISIONAL TOTAL KNEE ARTHROPLASTY WITH ANTIBIOTIC SPACERS Right 09/14/2015   Procedure: RIGHT TOTAL KNEE RESSECTION ARTHROPLASTY WITH ANTIBIOTIC SPACERS;  Surgeon: Ollen GrossFrank Aluisio, MD;  [facility];  Service: Orthopedics;  Laterality: Right;  . EYE SURGERY     cataract surgery bilat   . HEMICOLECTOMY FOR DIVERTICULITIS  2005  . HYSTEROSCOPY W/D&C  2002  . I&D KNEE WITH POLY EXCHANGE  02/01/2012   Procedure: IRRIGATION AND DEBRIDEMENT KNEE WITH POLY EXCHANGE;  Surgeon: Loanne DrillingFrank V Aluisio, MD;  [facility];  Service: Orthopedics;  Laterality:  Right;  Right Knee Polyethlene Revision   . IRRIGATION AND DEBRIDEMENT KNEE Right 03/10/2015   Procedure: IRRIGATION AND DEBRIDEMENT of right KNEE;  Surgeon: Ollen Gross, MD;  [facility];  Service: Orthopedics;  Laterality: Right;  . JOINT REPLACEMENT     right knee   . KNEE ARTHROSCOPY  LEFT 1999   RIGHT 2004  . KNEE ARTHROSCOPY  08/03/2011   Procedure: ARTHROSCOPY KNEE;  Surgeon: Loanne Drilling;  Location: [facility];  Service: Orthopedics;  Laterality: Right;  . KNEE ARTHROSCOPY Right 12/17/2014   Procedure: RIGHT ARTHROSCOPY KNEE, SYNOVECTOMY;  Surgeon: Ollen Gross, MD;  [facility];  Service: Orthopedics;  Laterality: Right;  . KNEE  ARTHROSCOPY W/ DEBRIDEMENT  01-22-2010   SEPTIC KNEE  . KNEE CLOSED REDUCTION  05-07-2009  RIGHT KNEE   LEFT KNEE  10-01-2009  . LAPAROSCOPIC GASTRIC SLEEVE RESECTION N/A 07/21/2014   Procedure: LAPAROSCOPIC GASTRIC SLEEVE RESECTION, TAKEDOWN OF INCARCERATED VENTRAL HERNIA, ENTERAL LYSIS, UPPER ENDOSCOPY;  Surgeon: Valarie Merino, MD;  [facility];  Service: General;  Laterality: N/A;  . LEFT FOOT SURG.  1990  . OPEN PATELLOFEMORAL RIGHT KNEE/ LATERAL RELEASE  05-04-2009  . QUADRICEPS TENDON REPAIR Right 06/02/2015   Procedure: RIGHT KNEE QUADRICEP TENDON REPAIR ;  Surgeon: Ollen Gross, MD;  [facility];  Service: Orthopedics;  Laterality: Right;  . RIGHT KNEE I & D POLYTHYLENE REVISION  02-04-2010   KNEE INFECTED  . SYNOVECTOMY  08/03/2011   Procedure: SYNOVECTOMY;  Surgeon: Loanne Drilling;  Location: [facility];  Service: Orthopedics;  Laterality: Right;  . TOTAL KNEE ARTHROPLASTY  LEFT  2005   RIGHT  03-13-2009  . TOTAL KNEE ARTHROPLASTY Right 11/23/2015   Procedure: RIGHT TOTAL KNEE ARTHROPLASTY REIMPLANTATION;  Surgeon: Ollen Gross, MD;  [facility];  Service: Orthopedics;  Laterality: Right;  . TOTAL KNEE REVISION  RIGHT 09-30-2009   LEFT 11-25-2009  . TOTAL KNEE REVISION Right 02/20/2013   Procedure: RIGHT TOTAL KNEE ARTHROPLASTY REVISION VERSUS RESECTION ARTHROPLASTY;  Surgeon: Loanne Drilling, MD;  [facility];  Service: Orthopedics;  Laterality: Right;  . TOTAL KNEE REVISION Right 04/02/2014   Procedure: RIGHT KNEE FEMORAL ARTHROPLASTY REVISION;  Surgeon: Loanne Drilling, MD;  [facility];  Service: Orthopedics;  Laterality: Right;    There were no vitals filed for this visit.      Subjective Assessment - 03/01/16 1307    Subjective Patient reported feeling no pain only weakness in knee. Have a headache today   Limitations Standing   How long can you stand comfortably? 10 minutes.   Patient Stated Goals Use right leg like normal  again.   Currently in Pain? No/denies                         [facility] Adult PT Treatment/Exercise - 03/01/16 0001      Exercises   Exercises Knee/Hip     Knee/Hip Exercises: Aerobic   Nustep x8min L3 UE/LE     Modalities   Modalities Electrical Stimulation     Electrical Stimulation   Electrical Stimulation Location Rt quad/VMO [place] 10 secs on/off x 15 mins   Electrical Stimulation Goals Strength     Vasopneumatic   Number Minutes Vasopneumatic  15 minutes   Vasopnuematic Location  Knee   Vasopneumatic Pressure Medium   Vasopneumatic Temperature  36            Standing TKE's  With Red Tband 3x10              PT Short Term Goals - 02/25/16 1259      PT SHORT TERM GOAL #1   Title I with initial HEP   Time 2   Period Weeks   Status Achieved           PT Long Term Goals - 02/24/16 1301      PT LONG TERM GOAL #1   Title I with advanced HEP   Status On-going     PT LONG TERM GOAL #2   Title Extensor lag not > 10 degrees.   Status On-going     PT LONG TERM GOAL #3   Title decreased pain in RLE by 50%   Status On-going     PT LONG TERM GOAL #4   Title demo improved RLE strength to 4+/5 or better to improve function.   Status On-going     PT LONG TERM GOAL #5   Title improve FOTO to CK   Status On-going               Plan - 03/01/16 1309    Clinical Impression Statement Pt did fairly well today and was able to perform standing TKE's with red Tband  for Quad activation. She also did well with the rest of Rx, but unable to perform SLR without EXT lag. Goals are ongoing.   Rehab Potential Fair   PT Frequency 2x / week   PT Duration 4 weeks   PT Treatment/Interventions ADLs/Self Care Home Management;Cryotherapy;Electrical Stimulation;Therapeutic exercise;Therapeutic activities;Neuromuscular re-education;Patient/family education;Passive range of motion;Manual techniques;Vasopneumatic Device   PT Next Visit Plan [place] e'stim  to right quadriceps/nustep per MPT; Ther ex to overcome extensor lag, vaso   Consulted and Agree with Plan of Care Patient      Patient will benefit from skilled therapeutic intervention in order to improve the following deficits and impairments:  Pain, Decreased activity tolerance, Increased edema, Decreased strength, Decreased range of motion  Visit Diagnosis: Right knee pain  Localized edema     Problem List Patient Active Problem List   Diagnosis Date Noted  . OA (osteoarthritis) of knee 11/23/2015  . 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 Septic arthritis of knee, right (HCC) 09/14/2015  . Quadriceps tendon rupture 06/02/2015  . Cellulitis of right knee 03/07/2015  . 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 Postoperative anemia due to acute blood loss 02/21/2013  . 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 Instability of prosthetic knee (HCC) 02/01/2012  . Osteoarthritis 01/09/2009  . Hypothyroidism 01/06/2009  . Hyperlipidemia 01/06/2009  . OBESITY, MORBID 01/06/2009  . Essential hypertension 01/06/2009  . Coronary atherosclerosis 01/06/2009  . GASTROESOPHAGEAL REFLUX DISEASE 01/06/2009  . Sleep apnea 01/06/2009  . DYSPNEA 01/06/2009  . DIVERTICULITIS, HX OF 01/06/2009    RAMSEUR,CHRIS, PTA 03/01/2016, 3:49 PM  [facility] [address] [place], [place], [address] Phone: [phone]   Fax:  [phone]  Name: AREYA LEMMERMAN MRN: [id number] Date of Birth: Sep 06, 1945

## 2016-03-03 ENCOUNTER — Ambulatory Visit: Payer: Medicare Other | Admitting: *Deleted

## 2016-03-03 DIAGNOSIS — R600 Localized edema: Secondary | ICD-10-CM

## 2016-03-03 DIAGNOSIS — M25561 Pain in right knee: Secondary | ICD-10-CM

## 2016-03-03 NOTE — Therapy (Signed)
[facility] [address] [place], [place], [address] Phone: [phone]   Fax:  [phone]  Physical Therapy Treatment  Patient Details  Name: Andrea Norton MRN: [id number] Date of Birth: [birth year] Referring Provider: Ollen Gross MD  Encounter Date: 03/03/2016      PT End of Session - 03/03/16 1312    Visit Number 5   Number of Visits 8   Date for PT Re-Evaluation 04/16/16   PT Start Time 1300   PT Stop Time 1352   PT Time Calculation (min) 52 min      Past Medical History:  Diagnosis Date  . Abnormal stress test 01-22-2009   LOW RISK ADENOSINE NUCLEAR STUDY W/ PROBABLE MILD APICAL THINNING BUT NO ISCHEMIA  . Arthritis    HANDS, KNEES  . B12 deficiency anemia   . Bronchitis    hx of   . Chronic back pain   . Chronic neck pain   . Coronary artery disease 2005   STRESS TEST /, NOte Dr Kirtland Bouchard EPIC/ no cardiologist now--STATES HEART STENT PLACED 2010  . DDD (degenerative disc disease), cervical   . DDD (degenerative disc disease), lumbar   . Depression    states well controlled  . Diabetes mellitus without complication (HCC)    pt states since having gastric banding does not have take medication was on metformin in past  . Diverticulitis of colon   . GERD (gastroesophageal reflux disease)   . H/O hiatal hernia   . Hemorrhoid   . Hemorrhoids   . History of urinary tract infection   . Hyperlipidemia   . Hypertension      PCP Dr Lysbeth Galas; pt currently on no medication for treatment   . Hypothyroidism   . Inguinal hernia, right    with ventral hernia  . Pneumonia    hx of walking pneumonia  . Radicular pain in left arm OCCASIONAL PAIN SHOOTING DOWN LEFT ARM SECAONDARY TO  CERVICAL DEGENERATION  . Sleep apnea    couldnt tolerate machine- states last sleep study " many years ago"  . Status post primary angioplasty with coronary stent    DRUG-ELUTING STENT X1  TO PROXIMAL LAD  . Synovitis of knee HYPERTROPHIC RIGHT  KNEE  . Urinary incontinence   . Vitamin D deficiency     Past Surgical History:  Procedure Laterality Date  . APPENDECTOMY  1993  . CATARACT EXTRACTION W/PHACO Right 05/06/2014   Procedure: CATARACT EXTRACTION PHACO AND INTRAOCULAR LENS PLACEMENT (IOC);  Surgeon: Loraine Leriche T. Nile Riggs, MD;  Location: [facility];  Service: Ophthalmology;  Laterality: Right;  CDE 6.41  . CATARACT EXTRACTION W/PHACO Left 05/20/2014   Procedure: CATARACT EXTRACTION PHACO AND INTRAOCULAR LENS PLACEMENT (IOC);  Surgeon: Loraine Leriche T. Nile Riggs, MD;  Location: [facility];  Service: Ophthalmology;  Laterality: Left;  CDE:4.42  . COLON SURGERY    . CORONARY ANGIOPLASTY  2004   DRUG-ELUTING STENT X1 TO PROXIMAL  LAD  . EXCISIONAL TOTAL KNEE ARTHROPLASTY WITH ANTIBIOTIC SPACERS Right 09/14/2015   Procedure: RIGHT TOTAL KNEE RESSECTION ARTHROPLASTY WITH ANTIBIOTIC SPACERS;  Surgeon: Ollen Gross, MD;  Location: [facility];  Service: Orthopedics;  Laterality: Right;  . EYE SURGERY     cataract surgery bilat   . HEMICOLECTOMY FOR DIVERTICULITIS  2005  . HYSTEROSCOPY W/D&C  2002  . I&D KNEE WITH POLY EXCHANGE  02/01/2012   Procedure: IRRIGATION AND DEBRIDEMENT KNEE WITH POLY EXCHANGE;  Surgeon: Loanne Drilling, MD;  Location: [facility];  Service: Orthopedics;  Laterality:  Right;  Right Knee Polyethlene Revision   . IRRIGATION AND DEBRIDEMENT KNEE Right 03/10/2015   Procedure: IRRIGATION AND DEBRIDEMENT of right KNEE;  Surgeon: Ollen Gross, MD;  [facility]: [facility];  Service: Orthopedics;  Laterality: Right;  . JOINT REPLACEMENT     right knee   . KNEE ARTHROSCOPY  LEFT 1999   RIGHT 2004  . KNEE ARTHROSCOPY  08/03/2011   Procedure: ARTHROSCOPY KNEE;  Surgeon: Loanne Drilling;  Location: [facility];  Service: Orthopedics;  Laterality: Right;  . KNEE ARTHROSCOPY Right 12/17/2014   Procedure: RIGHT ARTHROSCOPY KNEE, SYNOVECTOMY;  Surgeon: Ollen Gross, MD;  [facility]: [facility];  Service: Orthopedics;  Laterality: Right;  . KNEE  ARTHROSCOPY W/ DEBRIDEMENT  01-22-2010   SEPTIC KNEE  . KNEE CLOSED REDUCTION  05-07-2009  RIGHT KNEE   LEFT KNEE  10-01-2009  . LAPAROSCOPIC GASTRIC SLEEVE RESECTION N/A 07/21/2014   Procedure: LAPAROSCOPIC GASTRIC SLEEVE RESECTION, TAKEDOWN OF INCARCERATED VENTRAL HERNIA, ENTERAL LYSIS, UPPER ENDOSCOPY;  Surgeon: Valarie Merino, MD;  Location: [facility];  Service: General;  Laterality: N/A;  . LEFT FOOT SURG.  1990  . OPEN PATELLOFEMORAL RIGHT KNEE/ LATERAL RELEASE  05-04-2009  . QUADRICEPS TENDON REPAIR Right 06/02/2015   Procedure: RIGHT KNEE QUADRICEP TENDON REPAIR ;  Surgeon: Ollen Gross, MD;  Location: [facility];  Service: Orthopedics;  Laterality: Right;  . RIGHT KNEE I & D POLYTHYLENE REVISION  02-04-2010   KNEE INFECTED  . SYNOVECTOMY  08/03/2011   Procedure: SYNOVECTOMY;  Surgeon: Loanne Drilling;  Location: [facility];  Service: Orthopedics;  Laterality: Right;  . TOTAL KNEE ARTHROPLASTY  LEFT  2005   RIGHT  03-13-2009  . TOTAL KNEE ARTHROPLASTY Right 11/23/2015   Procedure: RIGHT TOTAL KNEE ARTHROPLASTY REIMPLANTATION;  Surgeon: Ollen Gross, MD;  [facility]: [facility];  Service: Orthopedics;  Laterality: Right;  . TOTAL KNEE REVISION  RIGHT 09-30-2009   LEFT 11-25-2009  . TOTAL KNEE REVISION Right 02/20/2013   Procedure: RIGHT TOTAL KNEE ARTHROPLASTY REVISION VERSUS RESECTION ARTHROPLASTY;  Surgeon: Loanne Drilling, MD;  [facility]: [facility];  Service: Orthopedics;  Laterality: Right;  . TOTAL KNEE REVISION Right 04/02/2014   Procedure: RIGHT KNEE FEMORAL ARTHROPLASTY REVISION;  Surgeon: Loanne Drilling, MD;  [facility]: [facility];  Service: Orthopedics;  Laterality: Right;    There were no vitals filed for this visit.      Subjective Assessment - 03/03/16 1312    Subjective Patient reported feeling no pain only weakness in RT knee.    Limitations Standing   How long can you stand comfortably? 10 minutes.   Patient Stated Goals Use right leg like normal again.   Currently in  Pain? No/denies                         [facility] Adult PT Treatment/Exercise - 03/03/16 0001      Exercises   Exercises Knee/Hip     Knee/Hip Exercises: Aerobic   Nustep x94min L4  UE/LE     Knee/Hip Exercises: Standing   Other Standing Knee Exercises TKE with  red tband x40     Modalities   Modalities Electrical Stimulation     Electrical Stimulation   Electrical Stimulation Location Rt quad/VMO Russian 10 secs on/off x 15 mins   Electrical Stimulation Goals Strength     Vasopneumatic   Number Minutes Vasopneumatic  15 minutes   Vasopnuematic Location  Knee   Vasopneumatic Pressure Medium  Vasopneumatic Temperature  36                  PT Short Term Goals - 02/25/16 1259      PT SHORT TERM GOAL #1   Title I with initial HEP   Time 2   Period Weeks   Status Achieved           PT Long Term Goals - 03/03/16 1333      PT LONG TERM GOAL #3   Title decreased pain in RLE by 50%   Time 4   Period Weeks   Status Achieved               Plan - 03/03/16 1313    Clinical Impression Statement Pt did fairly well today. She was able to meet LTG for 50% decreased pain, but unable to meet others due to Extensor Lag >10 degrees. Pt is still unable to meet LTG for strength and is unable to perform   a SLR on RT LE and goals are ongoing   Rehab Potential Fair   PT Frequency 2x / week   PT Duration 4 weeks   PT Treatment/Interventions ADLs/Self Care Home Management;Cryotherapy;Electrical Stimulation;Therapeutic exercise;Therapeutic activities;Neuromuscular re-education;Patient/family education;Passive range of motion;Manual techniques;Vasopneumatic Device   PT Next Visit Plan Russian e'stim to right quadriceps/nustep per MPT; Ther ex to overcome extensor lag, vaso   Consulted and Agree with Plan of Care Patient      Patient will benefit from skilled therapeutic intervention in order to improve the following deficits and impairments:  Pain,  Decreased activity tolerance, Increased edema, Decreased strength, Decreased range of motion  Visit Diagnosis: Right knee pain  Localized edema     Problem List Patient Active Problem List   Diagnosis Date Noted  . OA (osteoarthritis) of knee 11/23/2015  . 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 Septic arthritis of knee, right (HCC) 09/14/2015  . Quadriceps tendon rupture 06/02/2015  . Cellulitis of right knee 03/07/2015  . 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 Postoperative anemia due to acute blood loss 02/21/2013  . 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 Instability of prosthetic knee (HCC) 02/01/2012  . Osteoarthritis 01/09/2009  . Hypothyroidism 01/06/2009  . Hyperlipidemia 01/06/2009  . OBESITY, MORBID 01/06/2009  . Essential hypertension 01/06/2009  . Coronary atherosclerosis 01/06/2009  . GASTROESOPHAGEAL REFLUX DISEASE 01/06/2009  . Sleep apnea 01/06/2009  . DYSPNEA 01/06/2009  . DIVERTICULITIS, HX OF 01/06/2009    Kenniya Westrich,CHRIS, PTA 03/03/2016, 2:11 PM  [facility] [address] [place], [place], [address] Phone: [phone]   Fax:  [phone]  Name: Andrea Norton MRN: [id number] Date of Birth: September 10, 1945

## 2016-03-08 ENCOUNTER — Ambulatory Visit: Payer: Medicare Other | Admitting: *Deleted

## 2016-03-08 DIAGNOSIS — M25561 Pain in right knee: Secondary | ICD-10-CM

## 2016-03-08 DIAGNOSIS — R600 Localized edema: Secondary | ICD-10-CM

## 2016-03-08 NOTE — Therapy (Signed)
[facility] [address] [place], [place], [address] Phone: [phone]   Fax:  [phone]  Physical Therapy Treatment  Patient Details  Name: Andrea Norton MRN: [id number] Date of Birth: 10/18/1945 Referring Provider: Ollen GrossFrank Aluisio MD  Encounter Date: 03/08/2016      PT End of Session - 03/08/16 1515    Visit Number 6   Number of Visits 8   Date for PT Re-Evaluation 04/16/16   PT Start Time 1430   PT Stop Time 1524   PT Time Calculation (min) 54 min      Past Medical History:  Diagnosis Date  . Abnormal stress test 01-22-2009   LOW RISK ADENOSINE NUCLEAR STUDY W/ PROBABLE MILD APICAL THINNING BUT NO ISCHEMIA  . Arthritis    HANDS, KNEES  . B12 deficiency anemia   . Bronchitis    hx of   . Chronic back pain   . Chronic neck pain   . Coronary artery disease 2005   STRESS TEST /, NOte Dr Kirtland BouchardHocherin EPIC/ no cardiologist now--STATES HEART STENT PLACED 2010  . DDD (degenerative disc disease), cervical   . DDD (degenerative disc disease), lumbar   . Depression    states well controlled  . Diabetes mellitus without complication (HCC)    pt states since having gastric banding does not have take medication was on metformin in past  . Diverticulitis of colon   . GERD (gastroesophageal reflux disease)   . H/O hiatal hernia   . Hemorrhoid   . Hemorrhoids   . History of urinary tract infection   . Hyperlipidemia   . Hypertension      PCP Dr Lysbeth GalasNyland; pt currently on no medication for treatment   . Hypothyroidism   . Inguinal hernia, right    with ventral hernia  . Pneumonia    hx of walking pneumonia  . Radicular pain in left arm OCCASIONAL PAIN SHOOTING DOWN LEFT ARM SECAONDARY TO  CERVICAL DEGENERATION  . Sleep apnea    couldnt tolerate machine- states last sleep study " many years ago"  . Status post primary angioplasty with coronary stent    DRUG-ELUTING STENT X1  TO PROXIMAL LAD  . Synovitis of knee HYPERTROPHIC RIGHT  KNEE  . Urinary incontinence   . Vitamin D deficiency     Past Surgical History:  Procedure Laterality Date  . APPENDECTOMY  1993  . CATARACT EXTRACTION W/PHACO Right 05/06/2014   Procedure: CATARACT EXTRACTION PHACO AND INTRAOCULAR LENS PLACEMENT (IOC);  Surgeon: Loraine LericheMark T. Nile RiggsShapiro, MD;  Location: [facility];  Service: Ophthalmology;  Laterality: Right;  CDE 6.41  . CATARACT EXTRACTION W/PHACO Left 05/20/2014   Procedure: CATARACT EXTRACTION PHACO AND INTRAOCULAR LENS PLACEMENT (IOC);  Surgeon: Loraine LericheMark T. Nile RiggsShapiro, MD;  Location: [facility];  Service: Ophthalmology;  Laterality: Left;  CDE:4.42  . COLON SURGERY    . CORONARY ANGIOPLASTY  2004   DRUG-ELUTING STENT X1 TO PROXIMAL  LAD  . EXCISIONAL TOTAL KNEE ARTHROPLASTY WITH ANTIBIOTIC SPACERS Right 09/14/2015   Procedure: RIGHT TOTAL KNEE RESSECTION ARTHROPLASTY WITH ANTIBIOTIC SPACERS;  Surgeon: Ollen GrossFrank Aluisio, MD;  Location: [facility];  Service: Orthopedics;  Laterality: Right;  . EYE SURGERY     cataract surgery bilat   . HEMICOLECTOMY FOR DIVERTICULITIS  2005  . HYSTEROSCOPY W/D&C  2002  . I&D KNEE WITH POLY EXCHANGE  02/01/2012   Procedure: IRRIGATION AND DEBRIDEMENT KNEE WITH POLY EXCHANGE;  Surgeon: Loanne DrillingFrank V Aluisio, MD;  Location: [facility];  Service: Orthopedics;  Laterality:  Right;  Right Knee Polyethlene Revision   . IRRIGATION AND DEBRIDEMENT KNEE Right 03/10/2015   Procedure: IRRIGATION AND DEBRIDEMENT of right KNEE;  Surgeon: Ollen Gross, MD;  [facility]: [facility];  Service: Orthopedics;  Laterality: Right;  . JOINT REPLACEMENT     right knee   . KNEE ARTHROSCOPY  LEFT 1999   RIGHT 2004  . KNEE ARTHROSCOPY  08/03/2011   Procedure: ARTHROSCOPY KNEE;  Surgeon: Loanne Drilling;  Location: [facility];  Service: Orthopedics;  Laterality: Right;  . KNEE ARTHROSCOPY Right 12/17/2014   Procedure: RIGHT ARTHROSCOPY KNEE, SYNOVECTOMY;  Surgeon: Ollen Gross, MD;  [facility];  Service: Orthopedics;  Laterality: Right;  . KNEE  ARTHROSCOPY W/ DEBRIDEMENT  01-22-2010   SEPTIC KNEE  . KNEE CLOSED REDUCTION  05-07-2009  RIGHT KNEE   LEFT KNEE  10-01-2009  . LAPAROSCOPIC GASTRIC SLEEVE RESECTION N/A 07/21/2014   Procedure: LAPAROSCOPIC GASTRIC SLEEVE RESECTION, TAKEDOWN OF INCARCERATED VENTRAL HERNIA, ENTERAL LYSIS, UPPER ENDOSCOPY;  Surgeon: Valarie Merino, MD;  Location[facility];  Service: General;  Laterality: N/A;  . LEFT FOOT SURG.  1990  . OPEN PATELLOFEMORAL RIGHT KNEE/ LATERAL RELEASE  05-04-2009  . QUADRICEPS TENDON REPAIR Right 06/02/2015   Procedure: RIGHT KNEE QUADRICEP TENDON REPAIR ;  Surgeon: Ollen Gross, MD;  [facility]: [facility];  Service: Orthopedics;  Laterality: Right;  . RIGHT KNEE I & D POLYTHYLENE REVISION  02-04-2010   KNEE INFECTED  . SYNOVECTOMY  08/03/2011   Procedure: SYNOVECTOMY;  Surgeon: Loanne Drilling;  Location: [facility];  Service: Orthopedics;  Laterality: Right;  . TOTAL KNEE ARTHROPLASTY  LEFT  2005   RIGHT  03-13-2009  . TOTAL KNEE ARTHROPLASTY Right 11/23/2015   Procedure: RIGHT TOTAL KNEE ARTHROPLASTY REIMPLANTATION;  Surgeon: Ollen Gross, MD;  [facility];  Service: Orthopedics;  Laterality: Right;  . TOTAL KNEE REVISION  RIGHT 09-30-2009   LEFT 11-25-2009  . TOTAL KNEE REVISION Right 02/20/2013   Procedure: RIGHT TOTAL KNEE ARTHROPLASTY REVISION VERSUS RESECTION ARTHROPLASTY;  Surgeon: Loanne Drilling, MD;  [facility];  Service: Orthopedics;  Laterality: Right;  . TOTAL KNEE REVISION Right 04/02/2014   Procedure: RIGHT KNEE FEMORAL ARTHROPLASTY REVISION;  Surgeon: Loanne Drilling, MD;  [facility];  Service: Orthopedics;  Laterality: Right;    There were no vitals filed for this visit.                       [facility] Adult PT Treatment/Exercise - 03/08/16 0001      Exercises   Exercises Knee/Hip     Knee/Hip Exercises: Aerobic   Nustep x16 min L4  UE/LE     Knee/Hip Exercises: Standing   Other Standing Knee Exercises TKE with   red tband x40     Modalities   Modalities Electrical Stimulation     Electrical Stimulation   Electrical Stimulation Location Rt quad/VMO Russian 10 secs on/off x 15 mins   Electrical Stimulation Goals Strength     Vasopneumatic   Number Minutes Vasopneumatic  15 minutes   Vasopnuematic Location  Knee   Vasopneumatic Pressure Medium   Vasopneumatic Temperature  36                  PT Short Term Goals - 02/25/16 1259      PT SHORT TERM GOAL #1   Title I with initial HEP   Time 2   Period Weeks   Status Achieved  PT Long Term Goals - 03/03/16 1333      PT LONG TERM GOAL #3   Title decreased pain in RLE by 50%   Time 4   Period Weeks   Status Achieved               Plan - 03/08/16 1516    Clinical Impression Statement Pt did fairly well with Rx today and had no RT knee pain after Rx. She feels that her Isla PenceQuad is getting tighter and a little stronger. Trying to perform a SLR is still very challenging due to LE weakness and LBP. Goals are ongoing   Rehab Potential Fair   PT Frequency 2x / week   PT Duration 4 weeks   PT Treatment/Interventions ADLs/Self Care Home Management;Cryotherapy;Electrical Stimulation;Therapeutic exercise;Therapeutic activities;Neuromuscular re-education;Patient/family education;Passive range of motion;Manual techniques;Vasopneumatic Device   PT Next Visit Plan Russian e'stim to right quadriceps/nustep per MPT; Ther ex to overcome extensor lag, vaso   Consulted and Agree with Plan of Care Patient      Patient will benefit from skilled therapeutic intervention in order to improve the following deficits and impairments:  Pain, Decreased activity tolerance, Increased edema, Decreased strength, Decreased range of motion  Visit Diagnosis: Right knee pain  Localized edema     Problem List Patient Active Problem List   Diagnosis Date Noted  . OA (osteoarthritis) of knee 11/23/2015  . Infection of prosthetic right  knee joint (HCC) 09/15/2015  . Septic arthritis of knee, right (HCC) 09/14/2015  . Quadriceps tendon rupture 06/02/2015  . Cellulitis of right knee 03/07/2015  . 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 Postoperative anemia due to acute blood loss 02/21/2013  . 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 Instability of prosthetic knee (HCC) 02/01/2012  . Osteoarthritis 01/09/2009  . Hypothyroidism 01/06/2009  . Hyperlipidemia 01/06/2009  . OBESITY, MORBID 01/06/2009  . Essential hypertension 01/06/2009  . Coronary atherosclerosis 01/06/2009  . GASTROESOPHAGEAL REFLUX DISEASE 01/06/2009  . Sleep apnea 01/06/2009  . DYSPNEA 01/06/2009  . DIVERTICULITIS, HX OF 01/06/2009    Brookelle Pellicane,CHRIS, PTA 03/08/2016, 3:27 PM  [facility] [address] [place], [place], [address] Phone: [phone]   Fax:  [phone]  Name: Andrea Norton MRN: [id number] Date of Birth: [birth year]

## 2016-03-10 ENCOUNTER — Encounter: Payer: Self-pay | Admitting: Physical Therapy

## 2016-03-10 ENCOUNTER — Ambulatory Visit: Payer: Medicare Other | Admitting: Physical Therapy

## 2016-03-10 DIAGNOSIS — R600 Localized edema: Secondary | ICD-10-CM

## 2016-03-10 DIAGNOSIS — M25561 Pain in right knee: Secondary | ICD-10-CM | POA: Diagnosis not present

## 2016-03-10 NOTE — Therapy (Signed)
[facility] [address] [place], [place], [address] Phone: [phone]   Fax:  [phone]  Physical Therapy Treatment  Patient Details  Name: Andrea Norton MRN: [id number] Date of Birth: 24-Sep-1945 Referring Provider: Ollen Gross MD  Encounter Date: 03/10/2016      PT End of Session - 03/10/16 1427    Visit Number 7   Number of Visits 8   Date for PT Re-Evaluation 04/16/16   PT Start Time 1401   PT Stop Time 1448   PT Time Calculation (min) 47 min   Activity Tolerance Patient tolerated treatment well   Behavior During Therapy [facility] for tasks assessed/performed      Past Medical History:  Diagnosis Date  . Abnormal stress test 01-22-2009   LOW RISK ADENOSINE NUCLEAR STUDY W/ PROBABLE MILD APICAL THINNING BUT NO ISCHEMIA  . Arthritis    HANDS, KNEES  . B12 deficiency anemia   . Bronchitis    hx of   . Chronic back pain   . Chronic neck pain   . Coronary artery disease 2005   STRESS TEST /, NOte Dr Kirtland Bouchard EPIC/ no cardiologist now--STATES HEART STENT PLACED 2010  . DDD (degenerative disc disease), cervical   . DDD (degenerative disc disease), lumbar   . Depression    states well controlled  . Diabetes mellitus without complication (HCC)    pt states since having gastric banding does not have take medication was on metformin in past  . Diverticulitis of colon   . GERD (gastroesophageal reflux disease)   . H/O hiatal hernia   . Hemorrhoid   . Hemorrhoids   . History of urinary tract infection   . Hyperlipidemia   . Hypertension      PCP Dr Lysbeth Galas; pt currently on no medication for treatment   . Hypothyroidism   . Inguinal hernia, right    with ventral hernia  . Pneumonia    hx of walking pneumonia  . Radicular pain in left arm OCCASIONAL PAIN SHOOTING DOWN LEFT ARM SECAONDARY TO  CERVICAL DEGENERATION  . Sleep apnea    couldnt tolerate machine- states last sleep study " many years ago"  . Status post primary  angioplasty with coronary stent    DRUG-ELUTING STENT X1  TO PROXIMAL LAD  . Synovitis of knee HYPERTROPHIC RIGHT KNEE  . Urinary incontinence   . Vitamin D deficiency     Past Surgical History:  Procedure Laterality Date  . APPENDECTOMY  1993  . CATARACT EXTRACTION W/PHACO Right 05/06/2014   Procedure: CATARACT EXTRACTION PHACO AND INTRAOCULAR LENS PLACEMENT (IOC);  Surgeon: Loraine Leriche T. Nile Riggs, MD;  [facility];  Service: Ophthalmology;  Laterality: Right;  CDE 6.41  . CATARACT EXTRACTION W/PHACO Left 05/20/2014   Procedure: CATARACT EXTRACTION PHACO AND INTRAOCULAR LENS PLACEMENT (IOC);  Surgeon: Loraine Leriche T. Nile Riggs, MD;  Location: [facility];  Service: Ophthalmology;  Laterality: Left;  CDE:4.42  . COLON SURGERY    . CORONARY ANGIOPLASTY  2004   DRUG-ELUTING STENT X1 TO PROXIMAL  LAD  . EXCISIONAL TOTAL KNEE ARTHROPLASTY WITH ANTIBIOTIC SPACERS Right 09/14/2015   Procedure: RIGHT TOTAL KNEE RESSECTION ARTHROPLASTY WITH ANTIBIOTIC SPACERS;  Surgeon: Ollen Gross, MD;  [facility];  Service: Orthopedics;  Laterality: Right;  . EYE SURGERY     cataract surgery bilat   . HEMICOLECTOMY FOR DIVERTICULITIS  2005  . HYSTEROSCOPY W/D&C  2002  . I&D KNEE WITH POLY EXCHANGE  02/01/2012   Procedure: IRRIGATION AND DEBRIDEMENT KNEE WITH  POLY EXCHANGE;  Surgeon: Loanne DrillingFrank V Aluisio, MD;  [facility];  Service: Orthopedics;  Laterality: Right;  Right Knee Polyethlene Revision   . IRRIGATION AND DEBRIDEMENT KNEE Right 03/10/2015   Procedure: IRRIGATION AND DEBRIDEMENT of right KNEE;  Surgeon: Ollen GrossFrank Aluisio, MD;  [facility];  Service: Orthopedics;  Laterality: Right;  . JOINT REPLACEMENT     right knee   . KNEE ARTHROSCOPY  LEFT 1999   RIGHT 2004  . KNEE ARTHROSCOPY  08/03/2011   Procedure: ARTHROSCOPY KNEE;  Surgeon: Loanne DrillingFrank V Aluisio;  Location: [facility];  Service: Orthopedics;  Laterality: Right;  . KNEE ARTHROSCOPY Right 12/17/2014   Procedure: RIGHT ARTHROSCOPY KNEE,  SYNOVECTOMY;  Surgeon: Ollen GrossFrank Aluisio, MD;  [facility];  Service: Orthopedics;  Laterality: Right;  . KNEE ARTHROSCOPY W/ DEBRIDEMENT  01-22-2010   SEPTIC KNEE  . KNEE CLOSED REDUCTION  05-07-2009  RIGHT KNEE   LEFT KNEE  10-01-2009  . LAPAROSCOPIC GASTRIC SLEEVE RESECTION N/A 07/21/2014   Procedure: LAPAROSCOPIC GASTRIC SLEEVE RESECTION, TAKEDOWN OF INCARCERATED VENTRAL HERNIA, ENTERAL LYSIS, UPPER ENDOSCOPY;  Surgeon: Valarie MerinoMatthew B Martin, MD;  [facility];  Service: General;  Laterality: N/A;  . LEFT FOOT SURG.  1990  . OPEN PATELLOFEMORAL RIGHT KNEE/ LATERAL RELEASE  05-04-2009  . QUADRICEPS TENDON REPAIR Right 06/02/2015   Procedure: RIGHT KNEE QUADRICEP TENDON REPAIR ;  Surgeon: Ollen GrossFrank Aluisio, MD;  [facility];  Service: Orthopedics;  Laterality: Right;  . RIGHT KNEE I & D POLYTHYLENE REVISION  02-04-2010   KNEE INFECTED  . SYNOVECTOMY  08/03/2011   Procedure: SYNOVECTOMY;  Surgeon: Loanne DrillingFrank V Aluisio;  Location: [facility];  Service: Orthopedics;  Laterality: Right;  . TOTAL KNEE ARTHROPLASTY  LEFT  2005   RIGHT  03-13-2009  . TOTAL KNEE ARTHROPLASTY Right 11/23/2015   Procedure: RIGHT TOTAL KNEE ARTHROPLASTY REIMPLANTATION;  Surgeon: Ollen GrossFrank Aluisio, MD;  [facility];  Service: Orthopedics;  Laterality: Right;  . TOTAL KNEE REVISION  RIGHT 09-30-2009   LEFT 11-25-2009  . TOTAL KNEE REVISION Right 02/20/2013   Procedure: RIGHT TOTAL KNEE ARTHROPLASTY REVISION VERSUS RESECTION ARTHROPLASTY;  Surgeon: Loanne DrillingFrank V Aluisio, MD;  [facility];  Service: Orthopedics;  Laterality: Right;  . TOTAL KNEE REVISION Right 04/02/2014   Procedure: RIGHT KNEE FEMORAL ARTHROPLASTY REVISION;  Surgeon: Loanne DrillingFrank Aluisio V, MD;  [facility];  Service: Orthopedics;  Laterality: Right;    There were no vitals filed for this visit.      Subjective Assessment - 03/10/16 1406    Subjective Patient reported slight improvement overall   Limitations Standing   How long can you stand  comfortably? 10 minutes.   Patient Stated Goals Use right leg like normal again.   Currently in Pain? No/denies                         [facility]/Exercise - 03/10/16 0001      Knee/Hip Exercises: Aerobic   Nustep x15 min L4  UE/LE     Electrical Stimulation   Electrical Stimulation Location Rt quad/VMO Russian 10 secs on/off x 15 mins while performing Quad sets for activation   Electrical Stimulation Goals Neuromuscular facilitation;Strength     Vasopneumatic   Number Minutes Vasopneumatic  15 minutes   Vasopnuematic Location  Knee   Vasopneumatic Pressure Medium                  PT Short Term Goals -  02/25/16 1259      PT SHORT TERM GOAL #1   Title I with initial HEP   Time 2   Period Weeks   Status Achieved           PT Long Term Goals - 03/03/16 1333      PT LONG TERM GOAL #3   Title decreased pain in RLE by 50%   Time 4   Period Weeks   Status Achieved               Plan - 03/10/16 1432    Clinical Impression Statement Patient reported slight improvement thus far yet still unable to perorm SLR or fully use right LE with all her weight due to weakness. Patient is able to show qood quad activation with VMS and quad sets today. Patient goals ongoing at this time due to strength deficits.   Rehab Potential Fair   PT Frequency 2x / week   PT Duration 4 weeks   PT Treatment/Interventions ADLs/Self Care Home Management;Cryotherapy;Electrical Stimulation;Therapeutic exercise;Therapeutic activities;Neuromuscular re-education;Patient/family education;Passive range of motion;Manual techniques;Vasopneumatic Device   PT Next Visit Plan Russian e'stim to right quadriceps/nustep per MPT; Ther ex to overcome extensor lag, vaso  for 1 visit and MD note next treatment   Consulted and Agree with Plan of Care Patient      Patient will benefit from skilled therapeutic intervention in order to improve the following deficits and  impairments:  Pain, Decreased activity tolerance, Increased edema, Decreased strength, Decreased range of motion  Visit Diagnosis: Right knee pain  Localized edema     Problem List Patient Active Problem List   Diagnosis Date Noted  . OA (osteoarthritis) of knee 11/23/2015  . 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 Septic arthritis of knee, right (HCC) 09/14/2015  . Quadriceps tendon rupture 06/02/2015  . Cellulitis of right knee 03/07/2015  . 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 Postoperative anemia due to acute blood loss 02/21/2013  . 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 Instability of prosthetic knee (HCC) 02/01/2012  . Osteoarthritis 01/09/2009  . Hypothyroidism 01/06/2009  . Hyperlipidemia 01/06/2009  . OBESITY, MORBID 01/06/2009  . Essential hypertension 01/06/2009  . Coronary atherosclerosis 01/06/2009  . GASTROESOPHAGEAL REFLUX DISEASE 01/06/2009  . Sleep apnea 01/06/2009  . DYSPNEA 01/06/2009  . DIVERTICULITIS, HX OF 01/06/2009    Gabriel Paulding P, PTA 03/10/2016, 3:23 PM  [facility] [address] [place], [place], [address] Phone: [phone]   Fax:  [phone]  Name: Andrea Norton MRN: [id number] Date of Birth: 1945-12-30

## 2016-03-14 ENCOUNTER — Emergency Department (HOSPITAL_COMMUNITY): Payer: Medicare Other

## 2016-03-14 ENCOUNTER — Emergency Department (HOSPITAL_COMMUNITY)
Admission: EM | Admit: 2016-03-14 | Discharge: 2016-03-14 | Disposition: A | Discharge status: Home / Self Care | Payer: Medicare Other | Attending: Physician Assistant | Admitting: Physician Assistant

## 2016-03-14 ENCOUNTER — Encounter (HOSPITAL_COMMUNITY): Payer: Self-pay | Admitting: Emergency Medicine

## 2016-03-14 ENCOUNTER — Ambulatory Visit: Payer: Medicare Other | Admitting: Physical Therapy

## 2016-03-14 DIAGNOSIS — R600 Localized edema: Secondary | ICD-10-CM

## 2016-03-14 DIAGNOSIS — Y999 Unspecified external cause status: Secondary | ICD-10-CM | POA: Insufficient documentation

## 2016-03-14 DIAGNOSIS — M25561 Pain in right knee: Secondary | ICD-10-CM

## 2016-03-14 DIAGNOSIS — I2510 Atherosclerotic heart disease of native coronary artery without angina pectoris: Secondary | ICD-10-CM | POA: Insufficient documentation

## 2016-03-14 DIAGNOSIS — S93401A Sprain of unspecified ligament of right ankle, initial encounter: Secondary | ICD-10-CM | POA: Diagnosis not present

## 2016-03-14 DIAGNOSIS — W1839XA Other fall on same level, initial encounter: Secondary | ICD-10-CM | POA: Insufficient documentation

## 2016-03-14 DIAGNOSIS — S99911A Unspecified injury of right ankle, initial encounter: Secondary | ICD-10-CM | POA: Diagnosis present

## 2016-03-14 DIAGNOSIS — Y92009 Unspecified place in unspecified non-institutional (private) residence as the place of occurrence of the external cause: Secondary | ICD-10-CM | POA: Insufficient documentation

## 2016-03-14 DIAGNOSIS — Z79899 Other long term (current) drug therapy: Secondary | ICD-10-CM | POA: Diagnosis not present

## 2016-03-14 DIAGNOSIS — Y9301 Activity, walking, marching and hiking: Secondary | ICD-10-CM | POA: Insufficient documentation

## 2016-03-14 DIAGNOSIS — E119 Type 2 diabetes mellitus without complications: Secondary | ICD-10-CM | POA: Insufficient documentation

## 2016-03-14 DIAGNOSIS — Z7982 Long term (current) use of aspirin: Secondary | ICD-10-CM | POA: Diagnosis not present

## 2016-03-14 DIAGNOSIS — I10 Essential (primary) hypertension: Secondary | ICD-10-CM | POA: Diagnosis not present

## 2016-03-14 DIAGNOSIS — Z87891 Personal history of nicotine dependence: Secondary | ICD-10-CM | POA: Insufficient documentation

## 2016-03-14 DIAGNOSIS — Z9861 Coronary angioplasty status: Secondary | ICD-10-CM | POA: Insufficient documentation

## 2016-03-14 DIAGNOSIS — E039 Hypothyroidism, unspecified: Secondary | ICD-10-CM | POA: Insufficient documentation

## 2016-03-14 NOTE — ED Triage Notes (Signed)
Patient reports falling today and landed on right ankle. C/o generalized body aches with worsening pain in right lateral ankle radiating up right leg. Denies LOC, no anticoagulants. Swelling noted to right ankle, no obvious deformity.

## 2016-03-14 NOTE — Therapy (Signed)
[facility] [place], [place], [address] Phone: [phone]   Fax:  [phone]  Physical Therapy Treatment  Patient Details  Name: Andrea Norton MRN: [id number] Date of Birth: 1946-05-11 Referring Provider: Gaynelle Arabian MD  Encounter Date: 03/14/2016      PT End of Session - 03/14/16 0732    Visit Number 8   Number of Visits 8   Date for PT Re-Evaluation 04/16/16   PT Start Time 0730   PT Stop Time 0822   PT Time Calculation (min) 52 min   Activity Tolerance Patient tolerated treatment well   Behavior During Therapy [facility] for tasks assessed/performed      Past Medical History:  Diagnosis Date  . Abnormal stress test 01-22-2009   LOW RISK ADENOSINE NUCLEAR STUDY W/ PROBABLE MILD APICAL THINNING BUT NO ISCHEMIA  . Arthritis    HANDS, KNEES  . B12 deficiency anemia   . Bronchitis    hx of   . Chronic back pain   . Chronic neck pain   . Coronary artery disease 2005   STRESS TEST /, NOte Dr [place] EPIC/ no cardiologist now--STATES [place] 2010  . DDD (degenerative disc disease), cervical   . DDD (degenerative disc disease), lumbar   . Depression    states well controlled  . Diabetes mellitus without complication ([place])    pt states since having gastric banding does not have take medication was on metformin in past  . Diverticulitis of colon   . GERD (gastroesophageal reflux disease)   . H/O hiatal hernia   . Hemorrhoid   . Hemorrhoids   . History of urinary tract infection   . Hyperlipidemia   . Hypertension      PCP Dr Edrick Oh; pt currently on no medication for treatment   . Hypothyroidism   . Inguinal hernia, right    with ventral hernia  . Pneumonia    hx of walking pneumonia  . Radicular pain in left arm OCCASIONAL PAIN SHOOTING DOWN LEFT ARM SECAONDARY TO  CERVICAL DEGENERATION  . Sleep apnea    couldnt tolerate machine- states last sleep study " many years ago"  . Status post primary  angioplasty with coronary stent    DRUG-ELUTING STENT X1  TO PROXIMAL LAD  . Synovitis of knee HYPERTROPHIC RIGHT KNEE  . Urinary incontinence   . Vitamin D deficiency     Past Surgical History:  Procedure Laterality Date  . APPENDECTOMY  1993  . CATARACT EXTRACTION W/PHACO Right 05/06/2014   Procedure: CATARACT EXTRACTION PHACO AND INTRAOCULAR LENS PLACEMENT (IOC);  Surgeon: Elta Guadeloupe T. Gershon Crane, MD;  [facility]: [facility];  Service: Ophthalmology;  Laterality: Right;  CDE 6.41  . CATARACT EXTRACTION W/PHACO Left 05/20/2014   Procedure: CATARACT EXTRACTION PHACO AND INTRAOCULAR LENS PLACEMENT (IOC);  Surgeon: Elta Guadeloupe T. Gershon Crane, MD;  Location: [facility];  Service: Ophthalmology;  Laterality: Left;  CDE:4.42  . COLON SURGERY    . CORONARY ANGIOPLASTY  2004   DRUG-ELUTING STENT X1 TO PROXIMAL  LAD  . EXCISIONAL TOTAL KNEE ARTHROPLASTY WITH ANTIBIOTIC SPACERS Right 09/14/2015   Procedure: RIGHT TOTAL KNEE RESSECTION ARTHROPLASTY WITH ANTIBIOTIC SPACERS;  Surgeon: Gaynelle Arabian, MD;  [facility];  Service: Orthopedics;  Laterality: Right;  . EYE SURGERY     cataract surgery bilat   . HEMICOLECTOMY FOR DIVERTICULITIS  2005  . HYSTEROSCOPY W/D&C  2002  . I&D KNEE WITH POLY EXCHANGE  02/01/2012   Procedure: IRRIGATION AND DEBRIDEMENT KNEE WITH  POLY EXCHANGE;  Surgeon: Gearlean Alf, MD;  [facility];  Service: Orthopedics;  Laterality: Right;  Right Knee Polyethlene Revision   . IRRIGATION AND DEBRIDEMENT KNEE Right 03/10/2015   Procedure: IRRIGATION AND DEBRIDEMENT of right KNEE;  Surgeon: Gaynelle Arabian, MD;  [facility];  Service: Orthopedics;  Laterality: Right;  . JOINT REPLACEMENT     right knee   . KNEE ARTHROSCOPY  LEFT 1999   RIGHT 2004  . KNEE ARTHROSCOPY  08/03/2011   Procedure: ARTHROSCOPY KNEE;  Surgeon: Gearlean Alf;  Location: [place];  Service: Orthopedics;  Laterality: Right;  . KNEE ARTHROSCOPY Right 12/17/2014   Procedure: RIGHT ARTHROSCOPY KNEE,  SYNOVECTOMY;  Surgeon: Gaynelle Arabian, MD;  [facility];  Service: Orthopedics;  Laterality: Right;  . KNEE ARTHROSCOPY W/ DEBRIDEMENT  01-22-2010   SEPTIC KNEE  . KNEE CLOSED REDUCTION  05-07-2009  RIGHT KNEE   LEFT KNEE  10-01-2009  . LAPAROSCOPIC GASTRIC SLEEVE RESECTION N/A 07/21/2014   Procedure: LAPAROSCOPIC GASTRIC SLEEVE RESECTION, TAKEDOWN OF INCARCERATED VENTRAL HERNIA, ENTERAL LYSIS, UPPER ENDOSCOPY;  Surgeon: Pedro Earls, MD;  [facility];  Service: General;  Laterality: N/A;  . LEFT FOOT SURG.  1990  . OPEN PATELLOFEMORAL RIGHT KNEE/ LATERAL RELEASE  05-04-2009  . QUADRICEPS TENDON REPAIR Right 06/02/2015   Procedure: RIGHT KNEE QUADRICEP TENDON REPAIR ;  Surgeon: Gaynelle Arabian, MD;  [facility];  Service: Orthopedics;  Laterality: Right;  . RIGHT KNEE I & D POLYTHYLENE REVISION  02-04-2010   KNEE INFECTED  . SYNOVECTOMY  08/03/2011   Procedure: SYNOVECTOMY;  Surgeon: Gearlean Alf;  Location: [place];  Service: Orthopedics;  Laterality: Right;  . TOTAL KNEE ARTHROPLASTY  LEFT  2005   RIGHT  03-13-2009  . TOTAL KNEE ARTHROPLASTY Right 11/23/2015   Procedure: RIGHT TOTAL KNEE ARTHROPLASTY REIMPLANTATION;  Surgeon: Gaynelle Arabian, MD;  [facility];  Service: Orthopedics;  Laterality: Right;  . TOTAL KNEE REVISION  RIGHT 09-30-2009   LEFT 11-25-2009  . TOTAL KNEE REVISION Right 02/20/2013   Procedure: RIGHT TOTAL KNEE ARTHROPLASTY REVISION VERSUS RESECTION ARTHROPLASTY;  Surgeon: Gearlean Alf, MD;  [facility];  Service: Orthopedics;  Laterality: Right;  . TOTAL KNEE REVISION Right 04/02/2014   Procedure: RIGHT KNEE FEMORAL ARTHROPLASTY REVISION;  Surgeon: Gearlean Alf, MD;  [facility];  Service: Orthopedics;  Laterality: Right;    There were no vitals filed for this visit.      Subjective Assessment - 03/14/16 0733    Subjective I think my knee is as goog as it is going to get.   Limitations Standing   How long can you  stand comfortably? 10 minutes.   Patient Stated Goals Use right leg like normal again.   Pain Location Back  My back hurts!!   Pain Orientation Right;Left;Mid;Lower   Pain Descriptors / Indicators Aching;Throbbing;Stabbing   Pain Type Surgical pain   Pain Onset More than a month ago   Pain Frequency Constant   Aggravating Factors  Constant.   Pain Relieving Factors Rest.   Multiple Pain Sites Yes                         [facility] Adult PT Treatment/Exercise - 03/14/16 0001      Exercises   Exercises Knee/Hip     Knee/Hip Exercises: Aerobic   Nustep 15 minutes at level 5.     Knee/Hip Exercises: Supine  Quad Sets Limitations 15 minutes facilitated with VMS (10 [place] on and 10 sec off).                  PT Short Term Goals - 02/25/16 1259      PT SHORT TERM GOAL #1   Title I with initial HEP   Time 2   Period Weeks   Status Achieved           PT Long Term Goals - Mar 16, 2016 0941      PT LONG TERM GOAL #1   Title I with advanced HEP   Baseline The patient has a EMS unit now.   Time 4   Period Weeks   Status Not Met     PT LONG TERM GOAL #2   Title Extensor lag not > 10 degrees.   Time 4   Period Weeks   Status Not Met     PT LONG TERM GOAL #3   Title decreased pain in RLE by 50%   Time 4   Period Weeks   Status Achieved     PT LONG TERM GOAL #4   Title demo improved RLE strength to 4+/5 or better to improve function.   Time 6   Period Weeks   Status Not Met     PT LONG TERM GOAL #5   Title improve FOTO to CK   Time 6   Period Weeks   Status Achieved               Plan - 2016-03-16 0756    Clinical Impression Statement Patient wants today to be her last day.  She now has an EMS unit at home.  We reviewed it's usage again today and patient had a good understanding.   PT Home Exercise Plan D/c to HEP.   Consulted and Agree with Plan of Care Patient      Patient will benefit from skilled therapeutic intervention in  order to improve the following deficits and impairments:  Pain, Decreased activity tolerance, Increased edema, Decreased strength, Decreased range of motion  Visit Diagnosis: Right knee pain  Localized edema       G-Codes - 03/16/16 0942    Functional Assessment Tool Used D/c...57% limitation.   Functional Limitation Mobility: Walking and moving around   Mobility: Walking and Moving Around Current Status [phone]) At least 40 percent but less than 60 percent impaired, limited or restricted   Mobility: Walking and Moving Around Goal Status [phone]) At least 20 percent but less than 40 percent impaired, limited or restricted   Mobility: Walking and Moving Around Discharge Status [phone]) At least 40 percent but less than 60 percent impaired, limited or restricted      Problem List Patient Active Problem List   Diagnosis Date Noted  . OA (osteoarthritis) of knee 11/23/2015  . Infection of prosthetic right knee joint ([place]) 09/15/2015  . Septic arthritis of knee, right ([place]) 09/14/2015  . Quadriceps tendon rupture 06/02/2015  . Cellulitis of right knee 03/07/2015  . 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 Postoperative anemia due to acute blood loss 02/21/2013  . Failed total knee arthroplasty ([place]) 02/20/2013  . Instability of prosthetic knee ([place]) 02/01/2012  . Osteoarthritis 01/09/2009  . Hypothyroidism 01/06/2009  . Hyperlipidemia 01/06/2009  . OBESITY, MORBID 01/06/2009  . Essential hypertension 01/06/2009  . Coronary atherosclerosis 01/06/2009  . GASTROESOPHAGEAL REFLUX DISEASE 01/06/2009  .  Sleep apnea 01/06/2009  . DYSPNEA 01/06/2009  . DIVERTICULITIS, HX OF 01/06/2009   PHYSICAL THERAPY DISCHARGE SUMMARY  Visits from Start of Care: 8  Current functional level related to goals / functional outcomes: Please see above.   Remaining deficits: Continued right knee extensor  lag.   Education / Equipment: HEP.Marland KitchenMarland KitchenMarland KitchenPatient also now has a EMS unit to stim her quadriceps. Plan: Patient agrees to discharge.  Patient goals were not met. Patient is being discharged due to meeting the stated rehab goals.  ?????       Eustacio Ellen, [place] MPT 03/14/2016, 9:56 AM  [facility] [address] [place], [place], [address] Phone: [phone]   Fax:  [phone]  Name: Andrea Norton MRN: [id number] Date of Birth: 1945/12/08

## 2016-03-14 NOTE — Discharge Instructions (Signed)
Ice and elevate the ankle.  Return here as needed.  Use your walker to help support you when you are walking

## 2016-03-16 NOTE — ED Provider Notes (Signed)
[facility] Provider Note   CSN: [id number] Arrival date & time: 03/14/16  1655     History   Chief Complaint Chief Complaint  Patient presents with  . Fall  . Ankle Pain    HPI Andrea Norton is a 70 y.o. female.  HPI Patient presents to the emergency department with right ankle pain following a fall that occurred at home.  The patient states that she was walking outside to look at the clips when she twisted her right ankle.  Patient is complaining of pain to the lateral aspect of the right ankle with swelling.  Patient states that movement and palpation make the pain worse.  Patient states she did not take any medications prior to arrival Past Medical History:  Diagnosis Date  . Abnormal stress test 01-22-2009   LOW RISK ADENOSINE NUCLEAR STUDY W/ PROBABLE MILD APICAL THINNING BUT NO ISCHEMIA  . Arthritis    HANDS, KNEES  . B12 deficiency anemia   . Bronchitis    hx of   . Chronic back pain   . Chronic neck pain   . Coronary artery disease 2005   STRESS TEST /, NOte Dr Kirtland BouchardHocherin EPIC/ no cardiologist now--STATES HEART STENT PLACED 2010  . DDD (degenerative disc disease), cervical   . DDD (degenerative disc disease), lumbar   . Depression    states well controlled  . Diabetes mellitus without complication (HCC)    pt states since having gastric banding does not have take medication was on metformin in past  . Diverticulitis of colon   . GERD (gastroesophageal reflux disease)   . H/O hiatal hernia   . Hemorrhoid   . Hemorrhoids   . History of urinary tract infection   . Hyperlipidemia   . Hypertension      PCP Dr Lysbeth GalasNyland; pt currently on no medication for treatment   . Hypothyroidism   . Inguinal hernia, right    with ventral hernia  . Pneumonia    hx of walking pneumonia  . Radicular pain in left arm OCCASIONAL PAIN SHOOTING DOWN LEFT ARM SECAONDARY TO  CERVICAL DEGENERATION  . Sleep apnea    couldnt tolerate machine- states last sleep study " many  years ago"  . Status post primary angioplasty with coronary stent    DRUG-ELUTING STENT X1  TO PROXIMAL LAD  . Synovitis of knee HYPERTROPHIC RIGHT KNEE  . Urinary incontinence   . Vitamin D deficiency     Patient Active Problem List   Diagnosis Date Noted  . OA (osteoarthritis) of knee 11/23/2015  . 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 Septic arthritis of knee, right (HCC) 09/14/2015  . Quadriceps tendon rupture 06/02/2015  . Cellulitis of right knee 03/07/2015  . 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 Postoperative anemia due to acute blood loss 02/21/2013  . 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 Instability of prosthetic knee (HCC) 02/01/2012  . Osteoarthritis 01/09/2009  . Hypothyroidism 01/06/2009  . Hyperlipidemia 01/06/2009  . OBESITY, MORBID 01/06/2009  . Essential hypertension 01/06/2009  . Coronary atherosclerosis 01/06/2009  . GASTROESOPHAGEAL REFLUX DISEASE 01/06/2009  . Sleep apnea 01/06/2009  . DYSPNEA 01/06/2009  . DIVERTICULITIS, HX OF 01/06/2009    Past Surgical History:  Procedure Laterality Date  . APPENDECTOMY  1993  . CATARACT EXTRACTION W/PHACO Right 05/06/2014   Procedure: CATARACT EXTRACTION PHACO AND INTRAOCULAR LENS PLACEMENT (IOC);  Surgeon: Loraine Leriche T. Nile Riggs, MD;  Location: [facility];  Service: Ophthalmology;  Laterality: Right;  CDE 6.41  . CATARACT EXTRACTION W/PHACO Left 05/20/2014   Procedure: CATARACT EXTRACTION PHACO AND INTRAOCULAR LENS PLACEMENT (IOC);  Surgeon: Loraine Leriche T. Nile Riggs, MD;  Location: [facility];  Service: Ophthalmology;  Laterality: Left;  CDE:4.42  . COLON SURGERY    . CORONARY ANGIOPLASTY  2004   DRUG-ELUTING STENT X1 TO PROXIMAL  LAD  . EXCISIONAL TOTAL KNEE ARTHROPLASTY WITH ANTIBIOTIC SPACERS Right 09/14/2015   Procedure: RIGHT TOTAL KNEE RESSECTION ARTHROPLASTY WITH ANTIBIOTIC SPACERS;  Surgeon:  Ollen Gross, MD;  Location: [facility];  Service: Orthopedics;  Laterality: Right;  . EYE SURGERY     cataract surgery bilat   . HEMICOLECTOMY FOR DIVERTICULITIS  2005  . HYSTEROSCOPY W/D&C  2002  . I&D KNEE WITH POLY EXCHANGE  02/01/2012   Procedure: IRRIGATION AND DEBRIDEMENT KNEE WITH POLY EXCHANGE;  Surgeon: Loanne Drilling, MD;  Location: [facility];  Service: Orthopedics;  Laterality: Right;  Right Knee Polyethlene Revision   . IRRIGATION AND DEBRIDEMENT KNEE Right 03/10/2015   Procedure: IRRIGATION AND DEBRIDEMENT of right KNEE;  Surgeon: Ollen Gross, MD;  Location: [facility];  Service: Orthopedics;  Laterality: Right;  . JOINT REPLACEMENT     right knee   . KNEE ARTHROSCOPY  LEFT 1999   RIGHT 2004  . KNEE ARTHROSCOPY  08/03/2011   Procedure: ARTHROSCOPY KNEE;  Surgeon: Loanne Drilling;  Location: [facility];  Service: Orthopedics;  Laterality: Right;  . KNEE ARTHROSCOPY Right 12/17/2014   Procedure: RIGHT ARTHROSCOPY KNEE, SYNOVECTOMY;  Surgeon: Ollen Gross, MD;  Location: [facility];  Service: Orthopedics;  Laterality: Right;  . KNEE ARTHROSCOPY W/ DEBRIDEMENT  01-22-2010   SEPTIC KNEE  . KNEE CLOSED REDUCTION  05-07-2009  RIGHT KNEE   LEFT KNEE  10-01-2009  . LAPAROSCOPIC GASTRIC SLEEVE RESECTION N/A 07/21/2014   Procedure: LAPAROSCOPIC GASTRIC SLEEVE RESECTION, TAKEDOWN OF INCARCERATED VENTRAL HERNIA, ENTERAL LYSIS, UPPER ENDOSCOPY;  Surgeon: Valarie Merino, MD;  Location: [facility];  Service: General;  Laterality: N/A;  . LEFT FOOT SURG.  1990  . OPEN PATELLOFEMORAL RIGHT KNEE/ LATERAL RELEASE  05-04-2009  . QUADRICEPS TENDON REPAIR Right 06/02/2015   Procedure: RIGHT KNEE QUADRICEP TENDON REPAIR ;  Surgeon: Ollen Gross, MD;  Location: [facility];  Service: Orthopedics;  Laterality: Right;  . RIGHT KNEE I & D POLYTHYLENE REVISION  02-04-2010   KNEE INFECTED  . SYNOVECTOMY  08/03/2011   Procedure: SYNOVECTOMY;  Surgeon: Loanne Drilling;  Location: [facility];   Service: Orthopedics;  Laterality: Right;  . TOTAL KNEE ARTHROPLASTY  LEFT  2005   RIGHT  03-13-2009  . TOTAL KNEE ARTHROPLASTY Right 11/23/2015   Procedure: RIGHT TOTAL KNEE ARTHROPLASTY REIMPLANTATION;  Surgeon: Ollen Gross, MD;  Location: [facility];  Service: Orthopedics;  Laterality: Right;  . TOTAL KNEE REVISION  RIGHT 09-30-2009   LEFT 11-25-2009  . TOTAL KNEE REVISION Right 02/20/2013   Procedure: RIGHT TOTAL KNEE ARTHROPLASTY REVISION VERSUS RESECTION ARTHROPLASTY;  Surgeon: Loanne Drilling, MD;  Location: [facility];  Service: Orthopedics;  Laterality: Right;  . TOTAL KNEE REVISION Right 04/02/2014   Procedure: RIGHT KNEE FEMORAL ARTHROPLASTY REVISION;  Surgeon: Loanne Drilling, MD;  Location: [facility];  Service: Orthopedics;  Laterality: Right;    OB History    No data available       Home Medications    Prior to Admission medications   Medication Sig Start Date  End Date Taking? Authorizing Provider  Aspirin Buf,AlHyd-MgHyd-CaCar, (ASCRIPTIN) 325 MG TABS Take 325 mg by mouth daily. Take a 325 mg Aspirin twice a day for three weeks, then discontinue the 325 mg Aspirin. After completing the 325 mg Aspirin, resume the 81 mg Aspirin daily at home. 11/25/15   Avel Peacerew Perkins, PA-C  docusate sodium (COLACE) 100 MG capsule Take 100 mg by mouth daily.    Historical Provider, MD  escitalopram (LEXAPRO) 10 MG tablet Take 10 mg by mouth at bedtime.    Historical Provider, MD  gabapentin (NEURONTIN) 100 MG capsule Take 300 mg by mouth 2 (two) times daily.     Historical Provider, MD  HYDROmorphone (DILAUDID) 2 MG tablet Take 1-2 tablets (2-4 mg total) by mouth every 4 (four) hours as needed for moderate pain or severe pain. Patient not taking: Reported on 02/16/2016 11/25/15   Avel Peacerew Perkins, PA-C  levothyroxine (SYNTHROID, LEVOTHROID) 125 MCG tablet Take 125 mcg by mouth daily before breakfast. Brand Name Only    Historical Provider, MD  methocarbamol (ROBAXIN) 500 MG tablet Take 1 tablet (500 mg total) by  mouth every 6 (six) hours as needed for muscle spasms. Patient not taking: Reported on 02/16/2016 11/25/15   Avel Peacerew Perkins, PA-C  pantoprazole (PROTONIX) 40 MG tablet Take 40 mg by mouth daily with lunch.     Historical Provider, MD  simvastatin (ZOCOR) 40 MG tablet Take 40 mg by mouth daily.    Historical Provider, MD    Family History Family History  Problem Relation Age of Onset  . COPD Mother   . Cancer Father     lung    Social History Social History  Substance Use Topics  . Smoking status: Former Smoker    Packs/day: 1.00    Years: 30.00    Types: Cigarettes    Quit date: 07/31/2002  . Smokeless tobacco: Never Used  . Alcohol use No     Allergies   Review of patient's allergies indicates no known allergies.   Review of Systems Review of Systems All other systems negative except as documented in the HPI. All pertinent positives and negatives as reviewed in the HPI.  Physical Exam Updated Vital Signs BP 137/71   Pulse 65   Temp 98.1 F (36.7 C)   Resp 18   SpO2 96%   Physical Exam  Constitutional: She is oriented to person, place, and time. She appears well-developed and well-nourished.  HENT:  Head: Normocephalic and atraumatic.  Eyes: Pupils are equal, round, and reactive to light.  Pulmonary/Chest: Effort normal.  Musculoskeletal:       Right ankle: She exhibits decreased range of motion, swelling and ecchymosis. She exhibits no deformity, no laceration and normal pulse. Tenderness. Lateral malleolus tenderness found. Achilles tendon normal.  Neurological: She is alert and oriented to person, place, and time.  Skin: Skin is warm and dry.  Nursing note and vitals reviewed.    ED Treatments / Results  Labs (all labs ordered are listed, but only abnormal results are displayed) Labs Reviewed - No data to display  EKG  EKG Interpretation None       Radiology Dg Ankle Complete Right  Result Date: 03/14/2016 CLINICAL DATA:  Fall, swelling/ pain along  lateral malleolus EXAM: RIGHT ANKLE - COMPLETE 3+ VIEW COMPARISON:  None. FINDINGS: No fracture or dislocation is seen.  Suspected tiny os navicularis. The ankle mortise is intact. The base of the fifth metatarsal is unremarkable. Moderate lateral soft tissue swelling. IMPRESSION: No fracture  or dislocation is seen. Moderate lateral soft tissue swelling. Electronically Signed   By: Charline Bills M.D.   On: 03/14/2016 18:02   Dg Foot Complete Right  Result Date: 03/14/2016 CLINICAL DATA:  Fall, right foot pain/swelling laterally, recent right knee surgery EXAM: RIGHT FOOT COMPLETE - 3+ VIEW COMPARISON:  None. FINDINGS: No fracture or dislocation is seen. The joint spaces are preserved. Visualized soft tissues are within normal limits. IMPRESSION: No fracture or dislocation is seen. Electronically Signed   By: Charline Bills M.D.   On: 03/14/2016 17:40    Procedures Procedures (including critical care time)  Medications Ordered in ED Medications - No data to display   Initial Impression / Assessment and Plan / ED Course  I have reviewed the triage vital signs and the nursing notes.  Pertinent labs & imaging results that were available during my care of the patient were reviewed by me and considered in my medical decision making (see chart for details).  Clinical Course    Patient retreated for right ankle sprain.  She prefers to have an ASO rather than a cam walker.  She will use her walker at home for help with mobilization.  The patient is advised to follow-up with her orthopedist.  Told to return here as needed.  Ice and elevate the ankle.  Patient agrees the plan and all questions were answered  Final Clinical Impressions(s) / ED Diagnoses   Final diagnoses:  Ankle sprain, right, initial encounter    New Prescriptions Discharge Medication List as of 03/14/2016  8:15 PM       Charlestine Night, PA-C 03/16/16 0147    Courteney Lyn Mackuen, MD 03/19/16 [phone]

## 2016-03-17 ENCOUNTER — Encounter: Payer: Medicare Other | Admitting: *Deleted

## 2017-01-01 IMAGING — CR DG FOOT COMPLETE 3+V*R*
3 series · 3 of 3 positions shown · non-contrast
Comparison: None.

CLINICAL DATA: Fall, right foot pain/swelling laterally, recent
right knee surgery

EXAM:
RIGHT FOOT COMPLETE - 3+ VIEW

[x foot ap right]
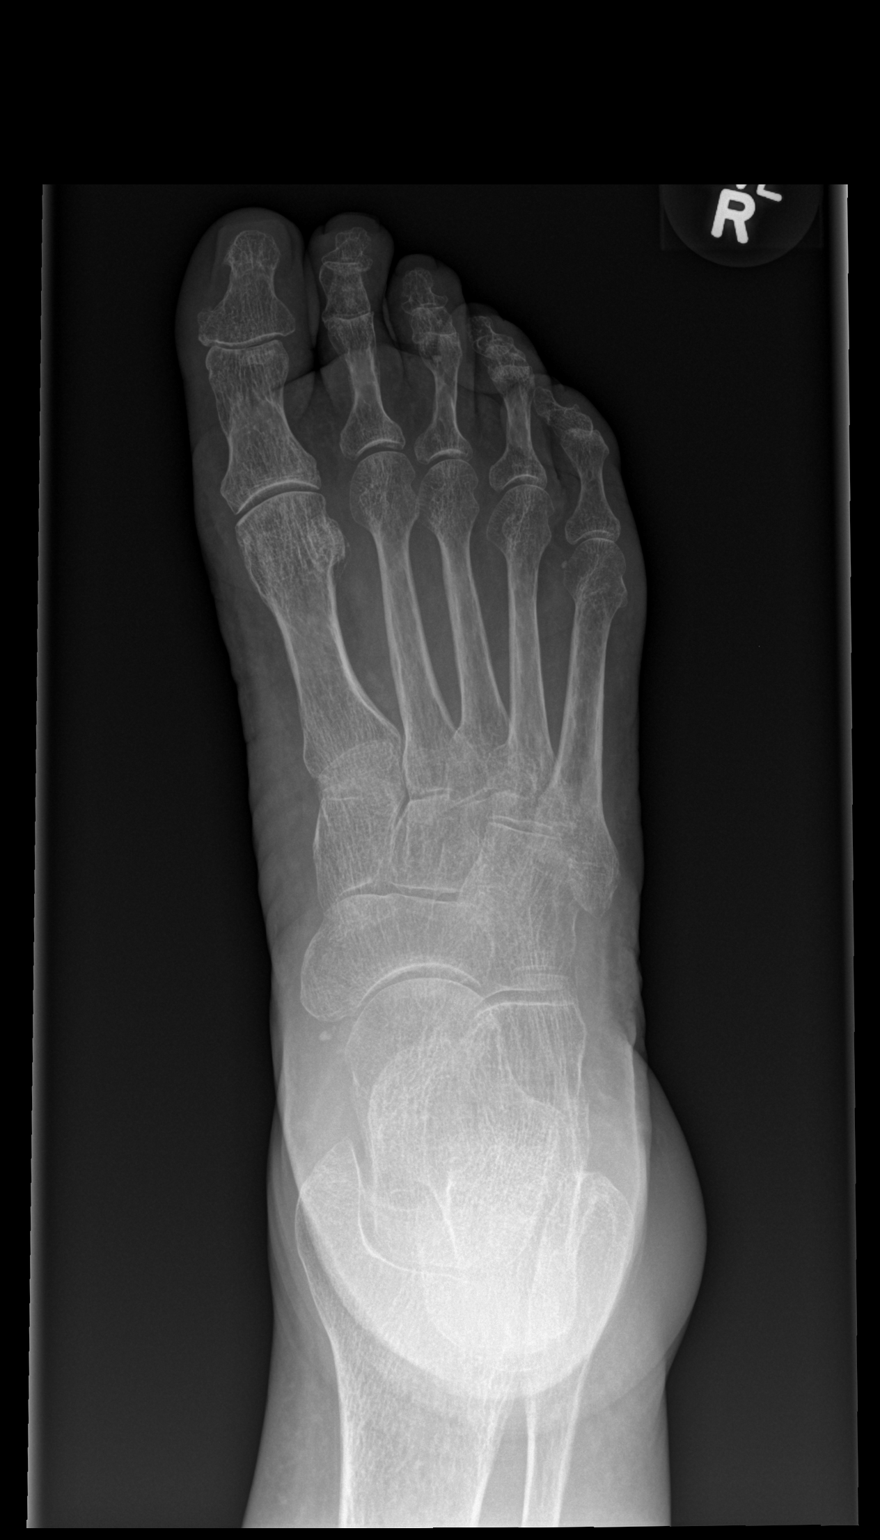

[x foot obl right]
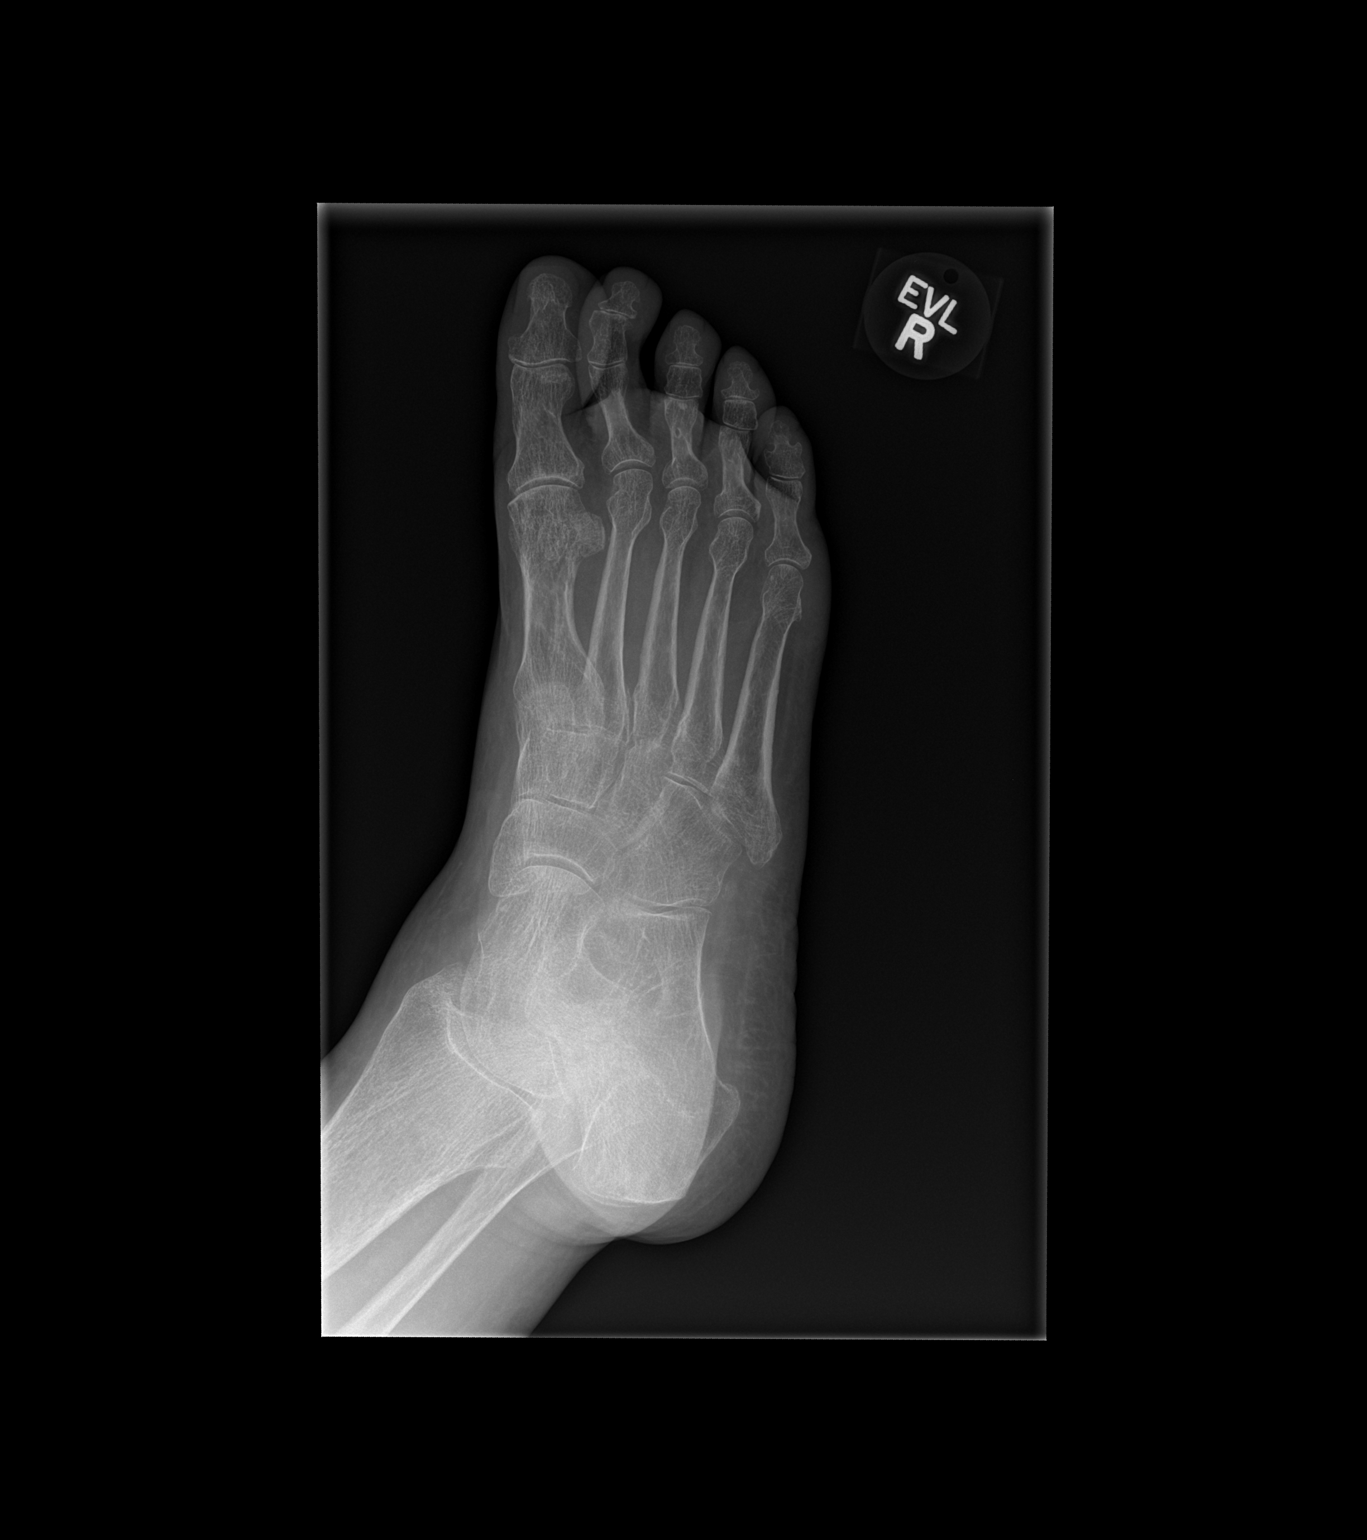

[x foot lat right]
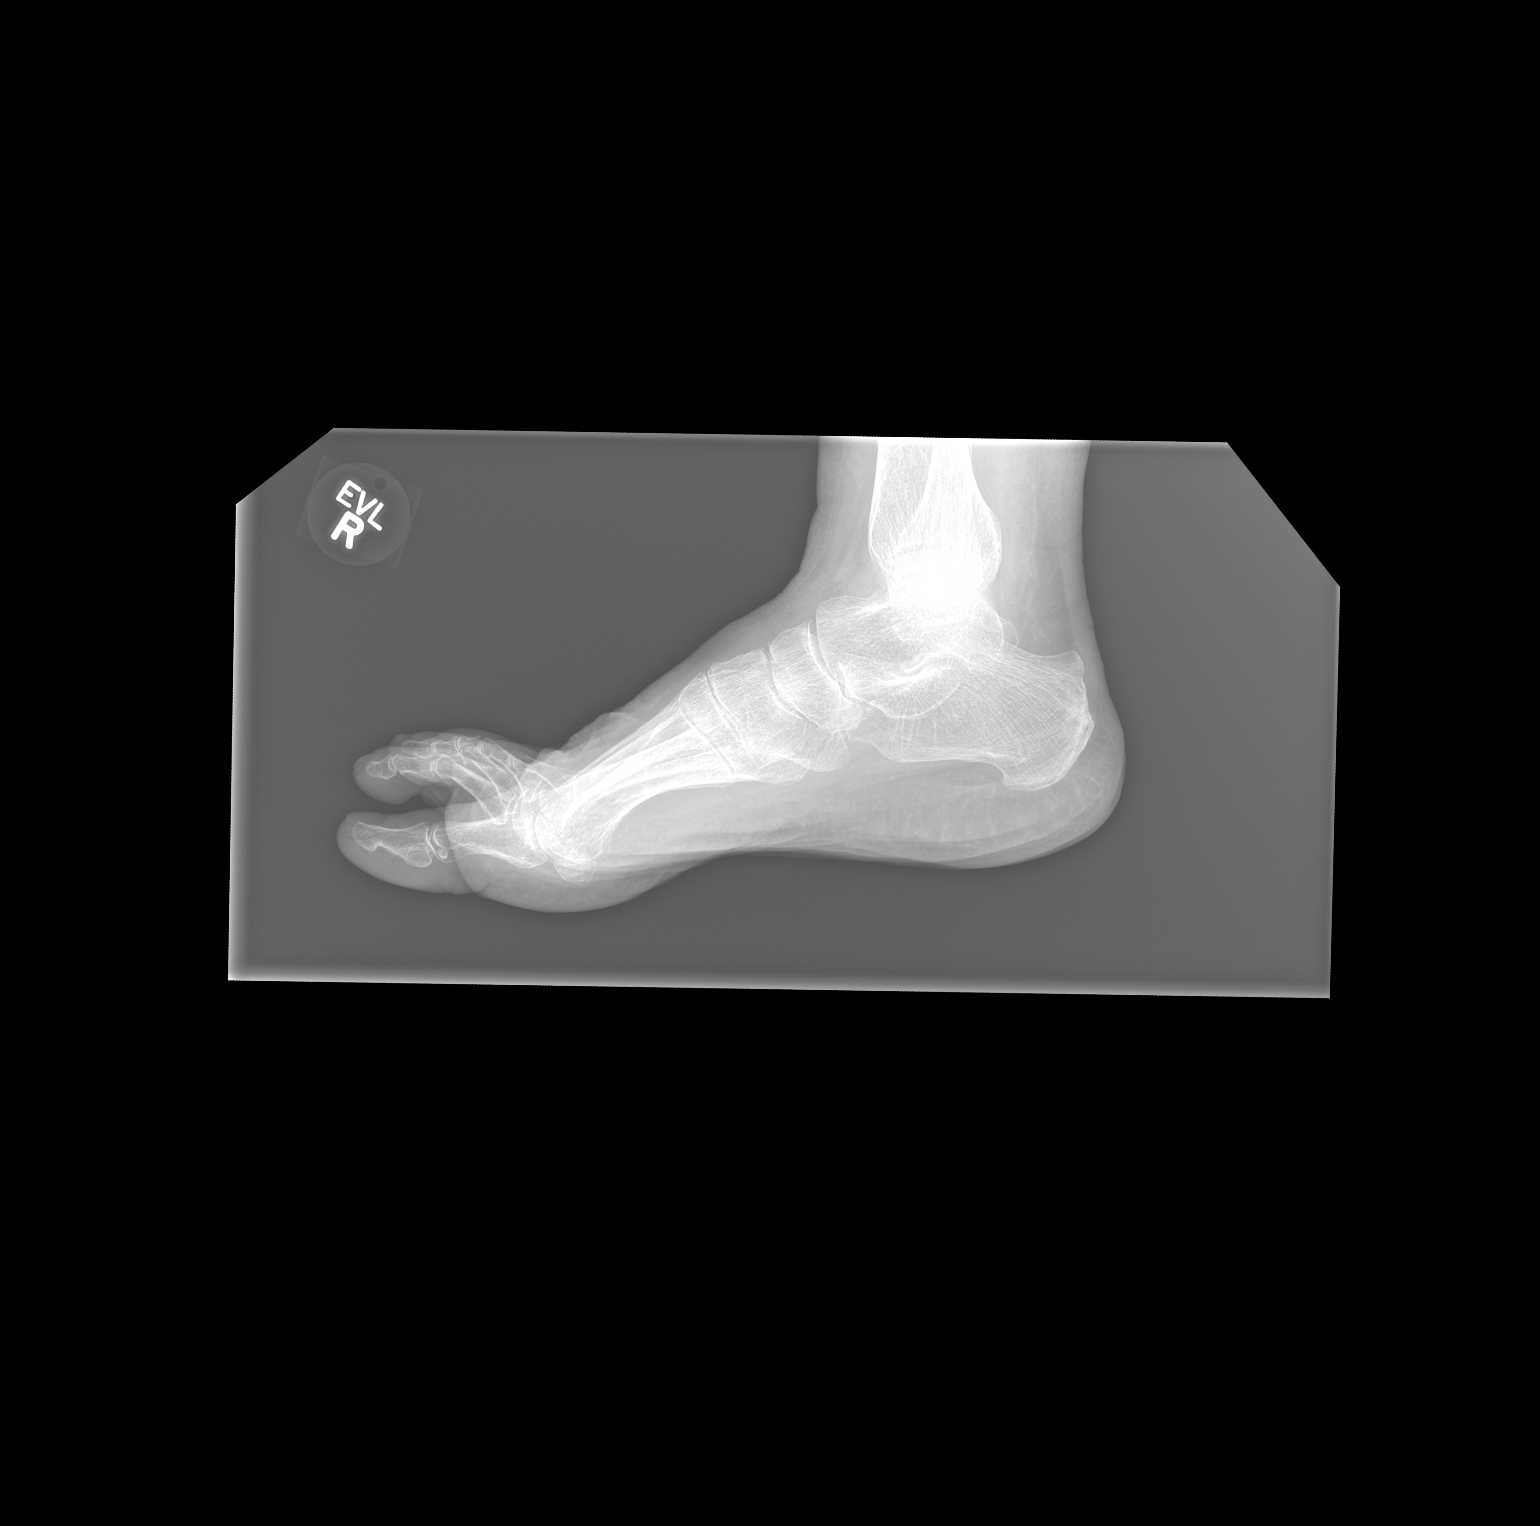

[3 of 3 positions shown; findings below may reference images not displayed]

FINDINGS: No fracture or dislocation is seen.

The joint spaces are preserved.

Visualized soft tissues are within normal limits.
IMPRESSION: No fracture or dislocation is seen.

## 2017-01-01 IMAGING — CR DG ANKLE COMPLETE 3+V*R*
3 series · 3 of 3 positions shown · non-contrast
Comparison: None.

CLINICAL DATA: Fall, swelling/ pain along lateral malleolus

EXAM:
RIGHT ANKLE - COMPLETE 3+ VIEW

[x ankle ap right]
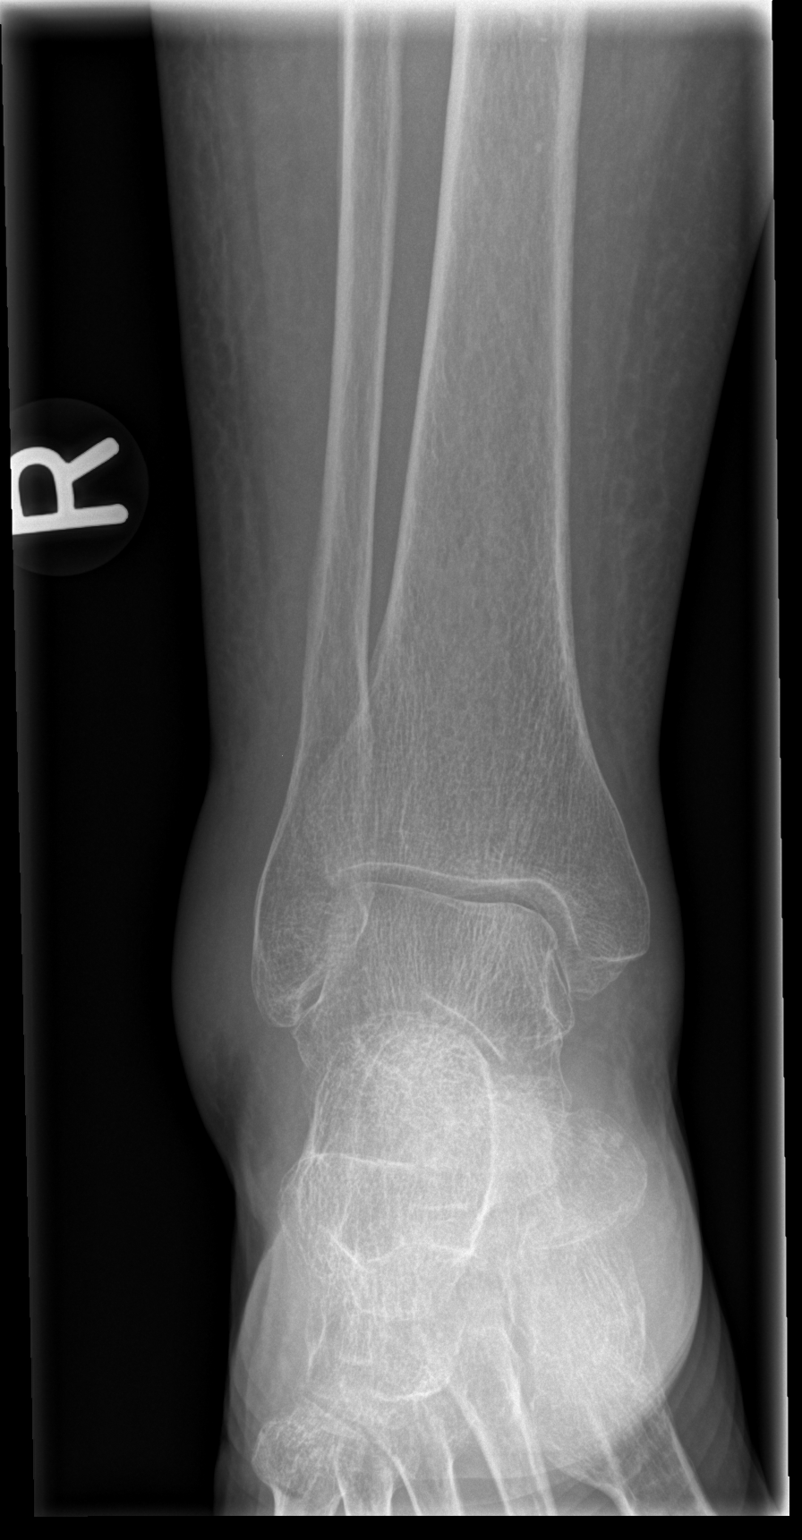

[x ankle obl right]
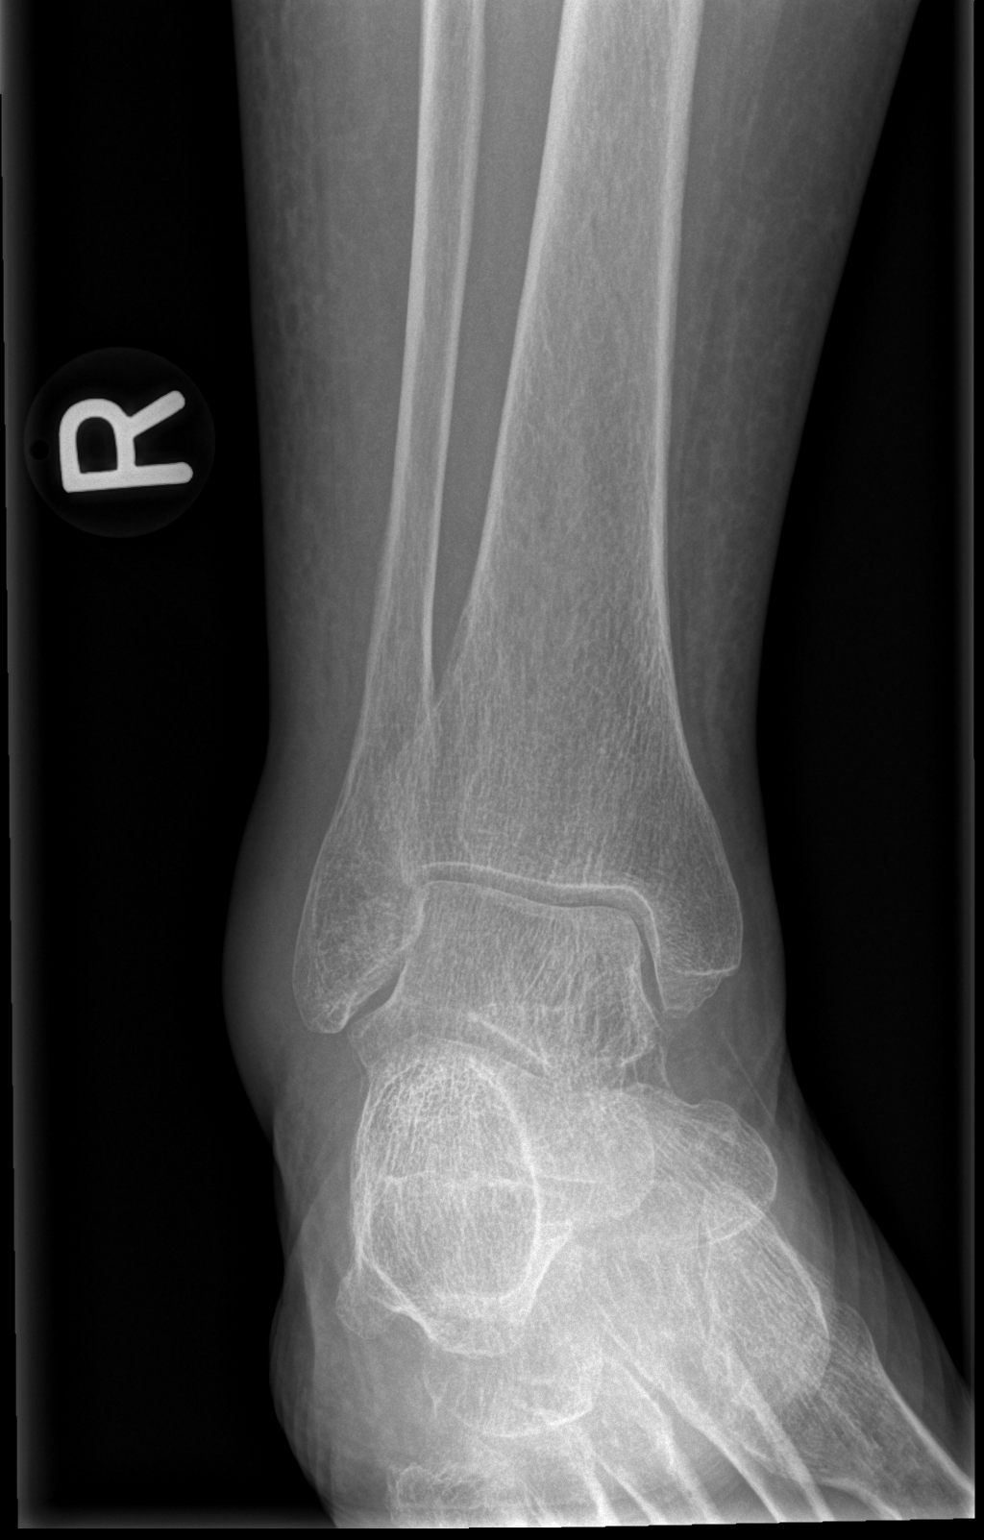

[x ankle lat right]
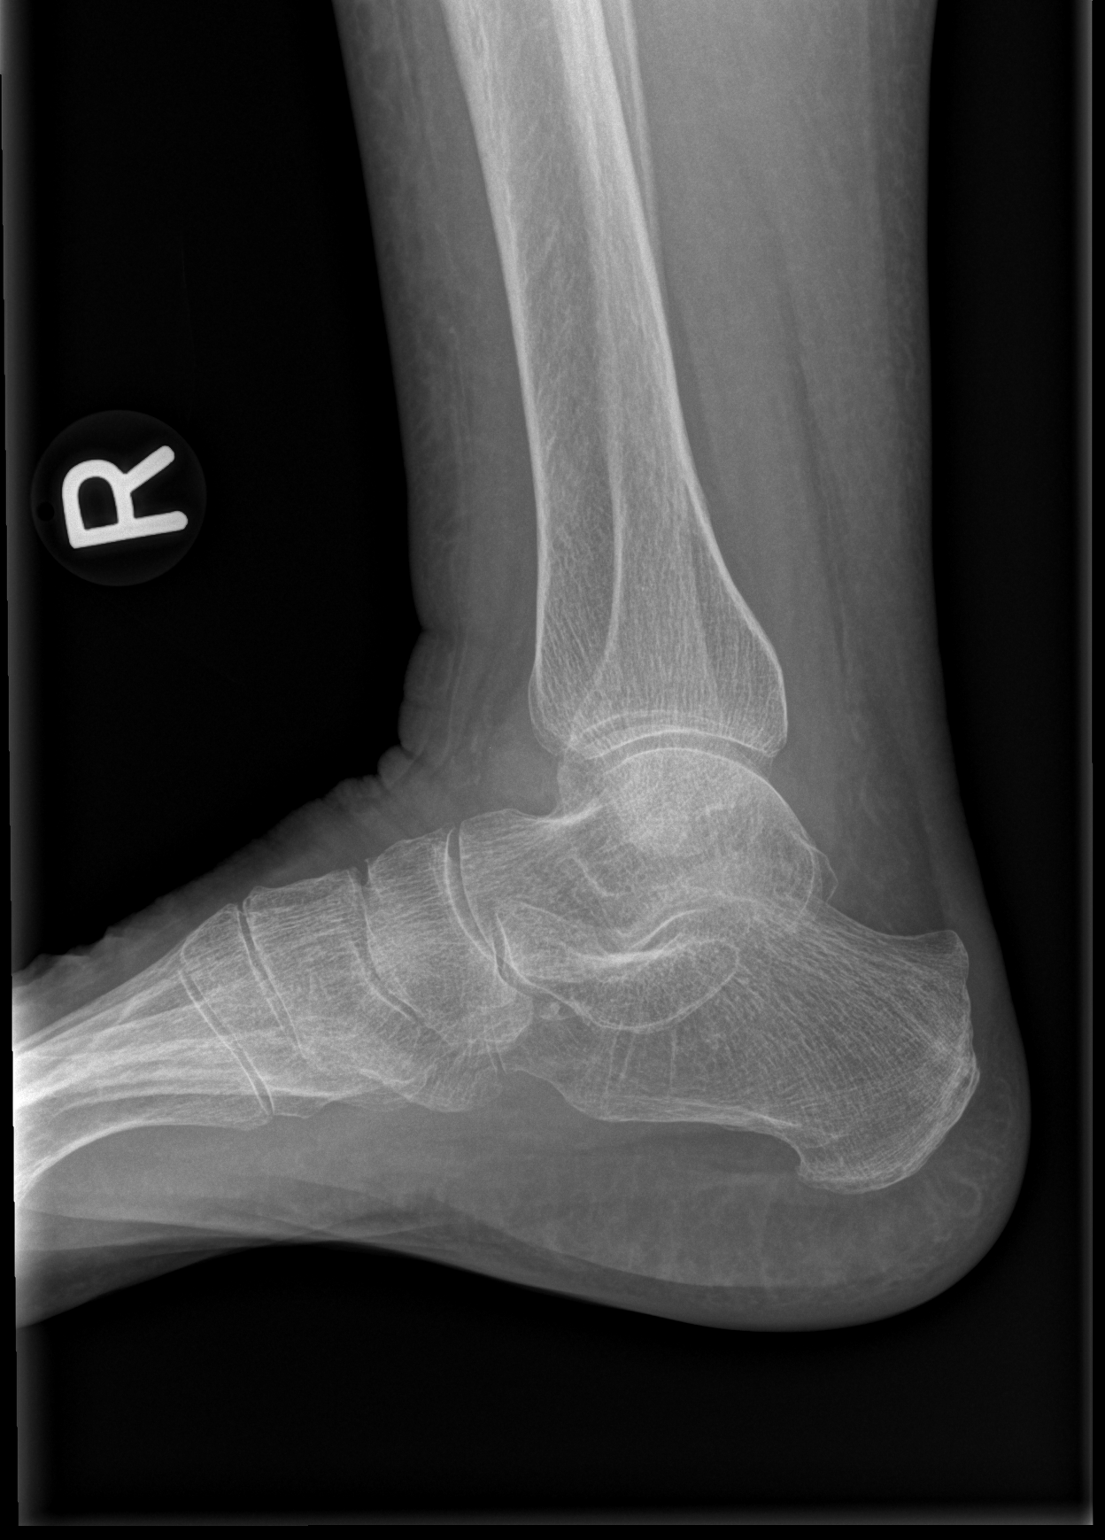

[3 of 3 positions shown; findings below may reference images not displayed]

FINDINGS: No fracture or dislocation is seen.  Suspected tiny os navicularis.

The ankle mortise is intact.

The base of the fifth metatarsal is unremarkable.

Moderate lateral soft tissue swelling.
IMPRESSION: No fracture or dislocation is seen.

Moderate lateral soft tissue swelling.

## 2017-02-22 ENCOUNTER — Encounter (HOSPITAL_COMMUNITY): Payer: Self-pay

## 2017-06-16 NOTE — Pre-Procedure Instructions (Signed)
Andrea Norton  06/16/2017      [id number] - [place], [place] - [address] [place] [id number] Phone: [phone] Fax: [phone]    Your procedure is scheduled on June 22, 2017.  [facility] Admitting at 0530 AM.  Call this number if you have problems the morning of surgery:  [phone]   Remember:  Do not eat food or drink liquids after midnight.  Take these medicines the morning of surgery with A SIP OF WATER escitalopram (lexapro), gabapentin (neurontin), hydromorphone (dilaudid)-if needed for pain, levothyroxine (synthroid), methocarbamol (robaxin).   STOP taking any meloxicam (mobic), Aspirin (unless otherwise instructed by your surgeon), Aleve, Naproxen, Ibuprofen, Motrin, Advil, Goody's, BC's, all herbal medications, fish oil, and all vitamins  Continue all other medications as instructed by your physician except follow the above medication instructions before surgery   Do not wear jewelry, make-up or nail polish.  Do not wear lotions, powders, or perfumes, or deodorant.  Do not shave 48 hours prior to surgery.    Do not bring valuables to the hospital.  [facility] is not responsible for any belongings or valuables.  Contacts, dentures or bridgework may not be worn into surgery.  Leave your suitcase in the car.  After surgery it may be brought to your room.  For patients admitted to the hospital, discharge time will be determined by your treatment team.  Patients discharged the day of surgery will not be allowed to drive home.   Special instructions:   [facility]- Preparing For Surgery  Before surgery, you can play an important role. Because skin is not sterile, your skin needs to be as free of germs as possible. You can reduce the number of germs on your skin by washing with CHG (chlorahexidine gluconate) Soap before surgery.  CHG is an antiseptic cleaner which kills germs and bonds with the  skin to continue killing germs even after washing.  Please do not use if you have an allergy to CHG or antibacterial soaps. If your skin becomes reddened/irritated stop using the CHG.  Do not shave (including legs and underarms) for at least 48 hours prior to first CHG shower. It is OK to shave your face.  Please follow these instructions carefully.   1. Shower the NIGHT BEFORE SURGERY and the MORNING OF SURGERY with CHG.   2. If you chose to wash your hair, wash your hair first as usual with your normal shampoo.  3. After you shampoo, rinse your hair and body thoroughly to remove the shampoo.  4. Use CHG as you would any other liquid soap. You can apply CHG directly to the skin and wash gently with a scrungie or a clean washcloth.   5. Apply the CHG Soap to your body ONLY FROM THE NECK DOWN.  Do not use on open wounds or open sores. Avoid contact with your eyes, ears, mouth and genitals (private parts). Wash Face and genitals (private parts)  with your normal soap.  6. Wash thoroughly, paying special attention to the area where your surgery will be performed.  7. Thoroughly rinse your body with warm water from the neck down.  8. DO NOT shower/wash with your normal soap after using and rinsing off the CHG Soap.  9. Pat yourself dry with a CLEAN TOWEL.  10. Wear CLEAN PAJAMAS to bed the night before surgery, wear comfortable clothes the morning of surgery  11. Place CLEAN SHEETS on  your bed the night of your first shower and DO NOT SLEEP WITH PETS.    Day of Surgery: Do not apply any deodorants/lotions. Please wear clean clothes to the hospital/surgery center.     Please read over the following fact sheets that you were given. Pain Booklet, Coughing and Deep Breathing, MRSA Information and Surgical Site Infection Prevention

## 2017-06-19 ENCOUNTER — Encounter (HOSPITAL_COMMUNITY)
Admission: RE | Admit: 2017-06-19 | Discharge: 2017-06-19 | Disposition: A | Discharge status: Home / Self Care | Payer: Medicare Other | Source: Ambulatory Visit | Attending: Orthopedic Surgery | Admitting: Orthopedic Surgery

## 2017-06-19 ENCOUNTER — Other Ambulatory Visit: Payer: Self-pay

## 2017-06-19 ENCOUNTER — Encounter (HOSPITAL_COMMUNITY): Payer: Self-pay

## 2017-06-19 ENCOUNTER — Other Ambulatory Visit (HOSPITAL_COMMUNITY): Payer: Medicare Other

## 2017-06-19 DIAGNOSIS — Z01812 Encounter for preprocedural laboratory examination: Secondary | ICD-10-CM | POA: Insufficient documentation

## 2017-06-19 DIAGNOSIS — Z01810 Encounter for preprocedural cardiovascular examination: Secondary | ICD-10-CM | POA: Insufficient documentation

## 2017-06-19 DIAGNOSIS — E119 Type 2 diabetes mellitus without complications: Secondary | ICD-10-CM | POA: Insufficient documentation

## 2017-06-19 DIAGNOSIS — I10 Essential (primary) hypertension: Secondary | ICD-10-CM | POA: Insufficient documentation

## 2017-06-19 HISTORY — DX: Anxiety disorder, unspecified: F41.9

## 2017-06-19 HISTORY — DX: Unspecified chronic bronchitis: J42

## 2017-06-19 LAB — CBC
HEMATOCRIT: 40.6 % (ref 36.0–46.0)
Hemoglobin: 12.9 g/dL (ref 12.0–15.0)
MCH: 26.7 pg (ref 26.0–34.0)
MCHC: 31.8 g/dL (ref 30.0–36.0)
MCV: 83.9 fL (ref 78.0–100.0)
PLATELETS: 224 10*3/uL (ref 150–400)
RBC: 4.84 MIL/uL (ref 3.87–5.11)
RDW: 15 % (ref 11.5–15.5)
WBC: 6.6 10*3/uL (ref 4.0–10.5)

## 2017-06-19 LAB — BASIC METABOLIC PANEL
Anion gap: 6 (ref 5–15)
BUN: 14 mg/dL (ref 6–20)
CHLORIDE: 110 mmol/L (ref 101–111)
CO2: 24 mmol/L (ref 22–32)
CREATININE: 0.88 mg/dL (ref 0.44–1.00)
Calcium: 9.1 mg/dL (ref 8.9–10.3)
GFR calc non Af Amer: >60 mL/min (ref >60)
Glucose, Bld: 98 mg/dL (ref 65–99)
POTASSIUM: 4 mmol/L (ref 3.5–5.1)
SODIUM: 140 mmol/L (ref 135–145)

## 2017-06-19 LAB — SURGICAL PCR SCREEN
MRSA, PCR: NEGATIVE
Staphylococcus aureus: NEGATIVE

## 2017-06-19 LAB — HEMOGLOBIN A1C
Hgb A1c MFr Bld: 5.2 % (ref 4.8–5.6)
MEAN PLASMA GLUCOSE: 102.54 mg/dL

## 2017-06-19 NOTE — Progress Notes (Addendum)
Mrs Andrea Norton denies chest pain or shortness of breath. Mrs Andrea Norton had a stent place in 2004, Dr Kirtland BouchardHocherin is her cardiologist, she has not seen him in years.  Patient has a history of HTN and DM, she is no longer requires medication since gastric sleeve surgery.  Dr Denzil MagnusonNylan is patient's PCP, last A1C drawn 03/05/17 was 5.3.  Patient does not have a CBG machine.  Patient was tested for sleep apnea years ago, she never could tolerate a CPAP.

## 2017-06-20 ENCOUNTER — Encounter (HOSPITAL_COMMUNITY): Payer: Self-pay | Admitting: Vascular Surgery

## 2017-06-21 MED ORDER — CEFAZOLIN SODIUM-DEXTROSE 2-4 GM/100ML-% IV SOLN: 2.0000 g | INTRAVENOUS | Status: DC

## 2017-06-21 NOTE — Progress Notes (Signed)
Anesthesia Chart Review:  Pt is a 71 year old female scheduled for lumbar spinal cord stimulator insertion on 06/22/2017 with Venita Lickahari Brooks, MD  - PCP is Joette CatchingLeonard Nyland, MD who cleared pt for surgery at last office visit 05/17/17 (notes in care everywhere)  - Used to see Rollene RotundaJames Hochrein, MD with cardiology. Last office visit 01/09/09 with Nicolasa Duckinghristopher Berge, NP  PMH includes:  CAD (DES to LAD 2004), HTN (not on meds), DM (diet controlled), hyperlipidemia, OSA, hypothyroidism, B12 deficiency. Former smoker. BMI 41. S/p R TKA multiple procedures to revise, exchange; last 11/23/15. S/p gastric sleeve 07/21/14  Medications include: ASA 81 mg, levothyroxine, Protonix, simvastatin  BP (!) 156/80   Pulse 77   Temp 36.8 C (Oral)   Resp 18   Ht 5\' 4"  (1.626 m)   Wt 238 lb (108 kg)   SpO2 97%   BMI 40.85 kg/m   Preoperative labs reviewed.   - HbA1c 5.2, glucose 98  EKG 06/19/17: NSR with sinus arrhythmia  Nuclear stress test 01/22/09:  - Low risk adenosine nuclear study with probable mild apical thinning but no ischemia  Cardiac cath 04/21/03 - LM: distal 25% stenosis - LAD: proximal 25%; 99% stenosis at large first septal perforator. S/p DES to LAD (99% reduced to 0) - CX: large branching mid OM is normal - RCA: diffuse luminal irregularities   Pt with hx CAD has not seen cardiology since 2010, however, pt has medical clearance from PCP and pt has undergone 9 right knee surgeries related to knee replacement since 2013 and underwent gastric sleeve surgery 07/21/14, all without anesthesia issue.  Most recent surgery was R total knee reimplantation 11/23/15.    No CV sx documented at pre-admission testing.   If no changes, I anticipate pt can proceed with surgery as scheduled.   Rica Mastngela Mouhamadou Gittleman, FNP-BC [facility] Phone: [phone] 06/21/2017 2:33 PM

## 2017-06-22 ENCOUNTER — Ambulatory Visit (HOSPITAL_COMMUNITY): Admission: RE | Admit: 2017-06-22 | Payer: Medicare Other | Source: Ambulatory Visit | Admitting: Orthopedic Surgery

## 2017-06-22 ENCOUNTER — Encounter (HOSPITAL_COMMUNITY): Admission: RE | Payer: Self-pay | Source: Ambulatory Visit

## 2017-06-22 SURGERY — INSERTION, SPINAL CORD STIMULATOR, LUMBAR
Anesthesia: General

## 2017-12-29 ENCOUNTER — Encounter (HOSPITAL_COMMUNITY): Payer: Self-pay | Admitting: Emergency Medicine

## 2017-12-29 ENCOUNTER — Emergency Department (HOSPITAL_COMMUNITY): Payer: Medicare Other

## 2017-12-29 ENCOUNTER — Emergency Department (HOSPITAL_COMMUNITY)
Admission: EM | Admit: 2017-12-29 | Discharge: 2017-12-29 | Disposition: A | Discharge status: Home / Self Care | Payer: Medicare Other | Attending: Emergency Medicine | Admitting: Emergency Medicine

## 2017-12-29 ENCOUNTER — Other Ambulatory Visit: Payer: Self-pay

## 2017-12-29 DIAGNOSIS — Z7982 Long term (current) use of aspirin: Secondary | ICD-10-CM | POA: Insufficient documentation

## 2017-12-29 DIAGNOSIS — I2510 Atherosclerotic heart disease of native coronary artery without angina pectoris: Secondary | ICD-10-CM | POA: Insufficient documentation

## 2017-12-29 DIAGNOSIS — W010XXA Fall on same level from slipping, tripping and stumbling without subsequent striking against object, initial encounter: Secondary | ICD-10-CM | POA: Diagnosis not present

## 2017-12-29 DIAGNOSIS — Y9389 Activity, other specified: Secondary | ICD-10-CM | POA: Diagnosis not present

## 2017-12-29 DIAGNOSIS — Y9289 Other specified places as the place of occurrence of the external cause: Secondary | ICD-10-CM | POA: Insufficient documentation

## 2017-12-29 DIAGNOSIS — E785 Hyperlipidemia, unspecified: Secondary | ICD-10-CM | POA: Insufficient documentation

## 2017-12-29 DIAGNOSIS — Z87891 Personal history of nicotine dependence: Secondary | ICD-10-CM | POA: Insufficient documentation

## 2017-12-29 DIAGNOSIS — I10 Essential (primary) hypertension: Secondary | ICD-10-CM | POA: Diagnosis not present

## 2017-12-29 DIAGNOSIS — W19XXXA Unspecified fall, initial encounter: Secondary | ICD-10-CM

## 2017-12-29 DIAGNOSIS — E119 Type 2 diabetes mellitus without complications: Secondary | ICD-10-CM | POA: Insufficient documentation

## 2017-12-29 DIAGNOSIS — S99911A Unspecified injury of right ankle, initial encounter: Secondary | ICD-10-CM | POA: Diagnosis present

## 2017-12-29 DIAGNOSIS — S82832A Other fracture of upper and lower end of left fibula, initial encounter for closed fracture: Secondary | ICD-10-CM | POA: Insufficient documentation

## 2017-12-29 DIAGNOSIS — Z79899 Other long term (current) drug therapy: Secondary | ICD-10-CM | POA: Diagnosis not present

## 2017-12-29 DIAGNOSIS — Y998 Other external cause status: Secondary | ICD-10-CM | POA: Insufficient documentation

## 2017-12-29 DIAGNOSIS — E039 Hypothyroidism, unspecified: Secondary | ICD-10-CM | POA: Insufficient documentation

## 2017-12-29 NOTE — Discharge Instructions (Addendum)
Your x-ray shows a fracture in 1 of the bones of your ankle call the fibula.  Please use the cam walker when up and about.  Please have your leg elevated above your waist when sitting, and above your heart when lying down.  Please discuss this fracture with Dr.Alusio or member of his team as soon as possible.

## 2017-12-29 NOTE — ED Triage Notes (Addendum)
Pt sates she fell around 1330 this afternoon getting [place] the [place]. Pt C/O right ankle pain. Pt states she has taken 10mg  Dilauded tablet around 1230.

## 2017-12-29 NOTE — ED Provider Notes (Signed)
[facility]GENCY DEPARTMENT Provider Note   CSN: [id number] Arrival date & time: 12/29/17  1855     History   Chief Complaint Chief Complaint  Patient presents with  . Fall    HPI Andrea Norton is a 72 y.o. female.  Patient is a 72 year old female who presents to the emergency department with a complaint of right ankle pain.  The patient states while attending her brothers funeral she fell while getting into a van and injured the right ankle.  She is been having some pain since that time.  The patient took a Dilaudid tablet for her pain and this has improved the pain significantly, but she noted swelling present.  She applied an ice pack and Ace wrap and came to the emergency department for evaluation.  The patient has not had operation on the right ankle, but has had multiple operations on the right knee.  She is also been told that she has arthritis in multiple areas.  She states that she suffers from diabetes mellitus.  She presents now for assistance with this issue.     Past Medical History:  Diagnosis Date  . Abnormal stress test 01-22-2009   LOW RISK ADENOSINE NUCLEAR STUDY W/ PROBABLE MILD APICAL THINNING BUT NO ISCHEMIA  . Anxiety   . Arthritis    HANDS, KNEES  . B12 deficiency anemia   . Bronchitis    hx of   . Chronic back pain   . Chronic bronchitis (HCC)   . Chronic neck pain   . Coronary artery disease 2005   STRESS TEST /, NOte Dr Kirtland BouchardHocherin EPIC/ no cardiologist now--STATES HEART STENT PLACED 2010  . DDD (degenerative disc disease), cervical   . DDD (degenerative disc disease), lumbar   . Depression    states well controlled  . Diabetes mellitus without complication (HCC)    pt states since having gastric banding does not have take medication was on metformin in past  . Diverticulitis of colon   . GERD (gastroesophageal reflux disease)   . H/O hiatal hernia   . Hemorrhoid   . Hemorrhoids   . History of urinary tract infection   . Hyperlipidemia    . Hypertension      PCP Dr Lysbeth GalasNyland; pt currently on no medication for treatment   . Hypothyroidism   . Inguinal hernia, right    with ventral hernia  . Pneumonia    hx of walking pneumonia  . Radicular pain in left arm OCCASIONAL PAIN SHOOTING DOWN LEFT ARM SECAONDARY TO  CERVICAL DEGENERATION  . Sleep apnea    couldnt tolerate machine- states last sleep study " many years ago"  . Status post primary angioplasty with coronary stent    DRUG-ELUTING STENT X1  TO PROXIMAL LAD  . Synovitis of knee HYPERTROPHIC RIGHT KNEE  . Urinary incontinence   . Vitamin D deficiency     Patient Active Problem List   Diagnosis Date Noted  . OA (osteoarthritis) of knee 11/23/2015  . 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 Septic arthritis of knee, right (HCC) 09/14/2015  . Quadriceps tendon rupture 06/02/2015  . Cellulitis of right knee 03/07/2015  . 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 Postoperative anemia due to acute blood loss 02/21/2013  . 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 Instability of prosthetic knee (HCC) 02/01/2012  . Osteoarthritis 01/09/2009  . Hypothyroidism 01/06/2009  .  Hyperlipidemia 01/06/2009  . OBESITY, MORBID 01/06/2009  . Essential hypertension 01/06/2009  . Coronary atherosclerosis 01/06/2009  . GASTROESOPHAGEAL REFLUX DISEASE 01/06/2009  . Sleep apnea 01/06/2009  . DYSPNEA 01/06/2009  . DIVERTICULITIS, HX OF 01/06/2009    Past Surgical History:  Procedure Laterality Date  . APPENDECTOMY  1993  . CATARACT EXTRACTION W/PHACO Right 05/06/2014   Procedure: CATARACT EXTRACTION PHACO AND INTRAOCULAR LENS PLACEMENT (IOC);  Surgeon: Loraine Leriche T. Nile Riggs, MD;  Location: [facility];  Service: Ophthalmology;  Laterality: Right;  CDE 6.41  . CATARACT EXTRACTION W/PHACO Left 05/20/2014   Procedure: CATARACT EXTRACTION PHACO AND INTRAOCULAR LENS  PLACEMENT (IOC);  Surgeon: Loraine Leriche T. Nile Riggs, MD;  Location: [facility];  Service: Ophthalmology;  Laterality: Left;  CDE:4.42  . COLON SURGERY    . CORONARY ANGIOPLASTY  2004   DRUG-ELUTING STENT X1 TO PROXIMAL  LAD  . EXCISIONAL TOTAL KNEE ARTHROPLASTY WITH ANTIBIOTIC SPACERS Right 09/14/2015   Procedure: RIGHT TOTAL KNEE RESSECTION ARTHROPLASTY WITH ANTIBIOTIC SPACERS;  Surgeon: Ollen Gross, MD;  Location: [facility];  Service: Orthopedics;  Laterality: Right;  . HEMICOLECTOMY FOR DIVERTICULITIS  2005  . HYSTEROSCOPY W/D&C  2002  . I&D KNEE WITH POLY EXCHANGE  02/01/2012   Procedure: IRRIGATION AND DEBRIDEMENT KNEE WITH POLY EXCHANGE;  Surgeon: Loanne Drilling, MD;  Location: [facility];  Service: Orthopedics;  Laterality: Right;  Right Knee Polyethlene Revision   . IRRIGATION AND DEBRIDEMENT KNEE Right 03/10/2015   Procedure: IRRIGATION AND DEBRIDEMENT of right KNEE;  Surgeon: Ollen Gross, MD;  Location: [facility];  Service: Orthopedics;  Laterality: Right;  . JOINT REPLACEMENT     right knee   . KNEE ARTHROSCOPY  LEFT 1999   RIGHT 2004  . KNEE ARTHROSCOPY  08/03/2011   Procedure: ARTHROSCOPY KNEE;  Surgeon: Loanne Drilling;  Location: [facility];  Service: Orthopedics;  Laterality: Right;  . KNEE ARTHROSCOPY Right 12/17/2014   Procedure: RIGHT ARTHROSCOPY KNEE, SYNOVECTOMY;  Surgeon: Ollen Gross, MD;  Location: [facility];  Service: Orthopedics;  Laterality: Right;  . KNEE ARTHROSCOPY W/ DEBRIDEMENT  01-22-2010   SEPTIC KNEE  . KNEE CLOSED REDUCTION  05-07-2009  RIGHT KNEE   LEFT KNEE  10-01-2009  . LAPAROSCOPIC GASTRIC SLEEVE RESECTION N/A 07/21/2014   Procedure: LAPAROSCOPIC GASTRIC SLEEVE RESECTION, TAKEDOWN OF INCARCERATED VENTRAL HERNIA, ENTERAL LYSIS, UPPER ENDOSCOPY;  Surgeon: Valarie Merino, MD;  Location: [facility];  Service: General;  Laterality: N/A;  . LEFT FOOT SURG.  1990  . OPEN PATELLOFEMORAL RIGHT KNEE/ LATERAL RELEASE  05-04-2009  . QUADRICEPS TENDON REPAIR Right  06/02/2015   Procedure: RIGHT KNEE QUADRICEP TENDON REPAIR ;  Surgeon: Ollen Gross, MD;  Location: [facility];  Service: Orthopedics;  Laterality: Right;  . RIGHT KNEE I & D POLYTHYLENE REVISION  02-04-2010   KNEE INFECTED  . SYNOVECTOMY  08/03/2011   Procedure: SYNOVECTOMY;  Surgeon: Loanne Drilling;  Location: [facility];  Service: Orthopedics;  Laterality: Right;  . TOTAL KNEE ARTHROPLASTY  LEFT  2005   RIGHT  03-13-2009  . TOTAL KNEE ARTHROPLASTY Right 11/23/2015   Procedure: RIGHT TOTAL KNEE ARTHROPLASTY REIMPLANTATION;  Surgeon: Ollen Gross, MD;  Location: [facility];  Service: Orthopedics;  Laterality: Right;  . TOTAL KNEE REVISION  RIGHT 09-30-2009   LEFT 11-25-2009  . TOTAL KNEE REVISION Right 02/20/2013   Procedure: RIGHT TOTAL KNEE ARTHROPLASTY REVISION VERSUS RESECTION ARTHROPLASTY;  Surgeon: Loanne Drilling, MD;  Location: [facility];  Service: Orthopedics;  Laterality: Right;  . TOTAL  KNEE REVISION Right 04/02/2014   Procedure: RIGHT KNEE FEMORAL ARTHROPLASTY REVISION;  Surgeon: Loanne Drilling, MD;  Location: [facility];  Service: Orthopedics;  Laterality: Right;     OB History   None      Home Medications    Prior to Admission medications   Medication Sig Start Date End Date Taking? Authorizing Provider  Aspirin Buf,AlHyd-MgHyd-CaCar, (ASCRIPTIN) 325 MG TABS Take 325 mg by mouth daily. Take a 325 mg Aspirin twice a day for three weeks, then discontinue the 325 mg Aspirin. After completing the 325 mg Aspirin, resume the 81 mg Aspirin daily at home. Patient not taking: Reported on 06/16/2017 11/25/15   Julien Girt, Alexzandrew L, PA-C  aspirin EC 81 MG tablet Take 81 mg by mouth daily.    [provider]  cyanocobalamin (,VITAMIN B-12,) 1000 MCG/ML injection INJECT  1 CC INTRAMUSCULARLY EVERY MONTH 11/16/16   [provider]  escitalopram (LEXAPRO) 10 MG tablet Take 10 mg by mouth at bedtime.    [provider]  gabapentin (NEURONTIN) 100 MG capsule  Take 300 mg by mouth 2 (two) times daily.     [provider]  HYDROmorphone (DILAUDID) 4 MG tablet Take 4 mg by mouth daily as needed for severe pain.    [provider]  ketoconazole (NIZORAL) 2 % cream APPLY TWICE DAILY AS NEEDED FOR IRRITATION/RASH. COMPOUNDED (TRI-MIX) TCA0.1/KETOCONAZOLE 2%/SSD 1%(30:30:45) 09/28/16   [provider]  levothyroxine (SYNTHROID, LEVOTHROID) 125 MCG tablet Take 125 mcg by mouth daily at 12 noon. Brand Name Only Takes at lunchtime    [provider]  meloxicam (MOBIC) 7.5 MG tablet Take 7.5 mg by mouth 2 (two) times daily.    [provider]  methocarbamol (ROBAXIN) 500 MG tablet Take 1 tablet (500 mg total) by mouth every 6 (six) hours as needed for muscle spasms. Patient taking differently: Take 500 mg by mouth daily as needed for muscle spasms.  11/25/15   Perkins, Alexzandrew L, PA-C  Multiple Vitamins-Minerals (HAIR SKIN NAILS PO) Take 1 tablet by mouth daily.    [provider]  naproxen sodium (ALEVE) 220 MG tablet Take 440 mg by mouth daily as needed (headaches).    [provider]  pantoprazole (PROTONIX) 40 MG tablet Take 40 mg by mouth every evening.     [provider]  silver sulfADIAZINE (SILVADENE) 1 % cream APPLY TWICE DAILY AS NEEDED FOR IRRITATION/RASH.  COMPOUNDED (TRI-MIX) TCA0.1/KETOCONAZOLE 2%/SSD 1%(30:30:45) 09/28/16   [provider]  simvastatin (ZOCOR) 40 MG tablet Take 40 mg by mouth daily at 6 PM.     [provider]  triamcinolone cream (KENALOG) 0.1 % APPLY TWICE DAILY AS NEEDED FOR IRRITATION/ITCHING. COMPOUNDED (TRI-MIX) TCA0.1/KETOCONAZOLE 2%/SSD 1%(30:30:45) 09/29/16   [provider]  Vitamin D, Ergocalciferol, (DRISDOL) 50000 units CAPS capsule Take 50,000 Units by mouth every 7 (seven) days. Friday    [provider]    Family History Family History  Problem Relation Age of Onset  . COPD Mother   . Cancer Father        lung     Social History Social History   Tobacco Use  . Smoking status: Former Smoker    Packs/day: 1.00    Years: 30.00    Pack years: 30.00    Types: Cigarettes    Last attempt to quit: 07/31/2002    Years since quitting: 15.4  . Smokeless tobacco: Never Used  Substance Use Topics  . Alcohol use: No  . Drug  use: No     Allergies   Morphine and related   Review of Systems Review of Systems  Constitutional: Negative for activity change.       All ROS Neg except as noted in HPI  HENT: Negative for nosebleeds.   Eyes: Negative for photophobia and discharge.  Respiratory: Negative for cough, shortness of breath and wheezing.   Cardiovascular: Negative for chest pain and palpitations.  Gastrointestinal: Negative for abdominal pain and blood in stool.  Genitourinary: Negative for dysuria, frequency and hematuria.  Musculoskeletal: Positive for arthralgias and back pain. Negative for neck pain.       Ankle pain  Skin: Negative.   Neurological: Negative for dizziness, seizures and speech difficulty.  Psychiatric/Behavioral: Negative for confusion and hallucinations.     Physical Exam Updated Vital Signs BP 120/68 (BP Location: Left Arm)   Pulse 76   Temp 98.2 F (36.8 C) (Oral)   Resp 17   Ht 5\' 4"  (1.626 m)   Wt 108.9 kg (240 lb)   SpO2 96%   BMI 41.20 kg/m   Physical Exam  Constitutional: She is oriented to person, place, and time. She appears well-developed and well-nourished.  Non-toxic appearance.  HENT:  Head: Normocephalic.  Right Ear: Tympanic membrane and external ear normal.  Left Ear: Tympanic membrane and external ear normal.  Eyes: Pupils are equal, round, and reactive to light. EOM and lids are normal.  Neck: Normal range of motion. Neck supple. Carotid bruit is not present.  Cardiovascular: Normal rate, regular rhythm, normal heart sounds, intact distal pulses and normal pulses.  Pulmonary/Chest: Breath sounds normal. No respiratory distress.    Abdominal: Soft. Bowel sounds are normal. There is no tenderness. There is no guarding.  Musculoskeletal: Normal range of motion.       Right ankle: She exhibits swelling. Tenderness. Lateral malleolus tenderness found. Achilles tendon normal.       Feet:  Dorsalis pedis pulses 2+.  Capillary refill is less than 2 seconds.  Lymphadenopathy:       Head (right side): No submandibular adenopathy present.       Head (left side): No submandibular adenopathy present.    She has no cervical adenopathy.  Neurological: She is alert and oriented to person, place, and time. She has normal strength. No cranial nerve deficit or sensory deficit.  Skin: Skin is warm and dry.  Psychiatric: She has a normal mood and affect. Her speech is normal.  Nursing note and vitals reviewed.    ED Treatments / Results  Labs (all labs ordered are listed, but only abnormal results are displayed) Labs Reviewed - No data to display  EKG None  Radiology No results found.  Procedures FRACTURE CARE RIGHT ANKLE. Marland KitchenSplint Application Date/Time: 12/29/2017 9:11 PM Performed by: Ivery Quale, PA-C Authorized by: Ivery Quale, PA-C   Consent:    Consent obtained:  Verbal   Consent given by:  Patient   Risks discussed:  Pain and swelling Universal protocol:    Procedure explained and questions answered to patient or proxy's satisfaction: yes     Immediately prior to procedure a time out was called: yes     Patient identity confirmed:  Arm band Pre-procedure details:    Sensation:  Normal   Skin color:  Normal Procedure details:    Laterality:  Right   Location:  Ankle   Supplies used: Cam Walker. Post-procedure details:    Pain:  Unchanged   Sensation:  Normal   Patient tolerance of  procedure:  Tolerated well, no immediate complications   (including critical care time)  Medications Ordered in ED Medications - No data to display   Initial Impression / Assessment and Plan / ED Course  I have  reviewed the triage vital signs and the nursing notes.  Pertinent labs & imaging results that were available during my care of the patient were reviewed by me and considered in my medical decision making (see chart for details).       Final Clinical Impressions(s) / ED Diagnoses Pt sustained a fall and began to have immediate pain of the right ankle. Extreme pain with applying weight. No neurovascular deficit on exam.  Xray reveals a fracture of the distal fibula.  Pt seen with me by Dr Rhunette Croft.  Pt fitted with cam walker. Pt wil see MD at [facility] for evaluation of the fracture. Rx for pain medication given with instructions to keep the ankle elevated and apply ice.   Final diagnoses:  Other closed fracture of distal end of left fibula, initial encounter  Fall, initial encounter    ED Discharge Orders    None       Ivery Quale, PA-C 01/02/18 1426    Derwood Kaplan, MD 01/03/18 [phone]

## 2018-04-04 NOTE — Pre-Procedure Instructions (Signed)
SHAKEITA KOZAL  04/04/2018      [id number] - [place], [place] - [address] [place] [address] Phone: [phone] Fax: [phone]    Your procedure is scheduled on  Thursday 04/12/18  Report to [facility] Admitting at 530 A.M.  Call this number if you have problems the morning of surgery:  [phone]   Remember:  Do not eat or drink after midnight.     Take these medicines the morning of surgery with A SIP OF WATER -  ECITALOPRAM(LEXAPRO), GABAPENTIN, LEVOTHYROXINE (SYNTHROID), PANTOPRAZOLE (PROTONIX)  7 days prior to surgery STOP taking any Aspirin(unless otherwise instructed by your surgeon), Aleve, Naproxen, MELOXICAM/ MOBIC,  Ibuprofen, Motrin, Advil, Goody's, BC's, all herbal medications, fish oil, and all vitamins    Do not wear jewelry, make-up or nail polish.  Do not wear lotions, powders, or perfumes, or deodorant.  Do not shave 48 hours prior to surgery.  Men may shave face and neck.  Do not bring valuables to the hospital.  [facility] is not responsible for any belongings or valuables.  Contacts, dentures or bridgework may not be worn into surgery.  Leave your suitcase in the car.  After surgery it may be brought to your room.  For patients admitted to the hospital, discharge time will be determined by your treatment team.  Patients discharged the day of surgery will not be allowed to drive home.   Name and phone number of your driver:    Special instructions:  [facility] - Preparing for Surgery  Before surgery, you can play an important role.  Because skin is not sterile, your skin needs to be as free of germs as possible.  You can reduce the number of germs on you skin by washing with CHG (chlorahexidine gluconate) soap before surgery.  CHG is an antiseptic cleaner which kills germs and bonds with the skin to continue killing germs even after washing.  Oral Hygiene is also important in reducing the risk of  infection.  Remember to brush your teeth with your regular toothpaste the morning of surgery.  Please DO NOT use if you have an allergy to CHG or antibacterial soaps.  If your skin becomes reddened/irritated stop using the CHG and inform your nurse when you arrive at Short Stay.  Do not shave (including legs and underarms) for at least 48 hours prior to the first CHG shower.  You may shave your face.  Please follow these instructions carefully:   1.  Shower with CHG Soap the night before surgery and the morning of Surgery.  2.  If you choose to wash your hair, wash your hair first as usual with your normal shampoo.  3.  After you shampoo, rinse your hair and body thoroughly to remove the shampoo. 4.  Use CHG as you would any other liquid soap.  You can apply chg directly to the skin and wash gently with a      scrungie or washcloth.           5.  Apply the CHG Soap to your body ONLY FROM THE NECK DOWN.   Do not use on open wounds or open sores. Avoid contact with your eyes, ears, mouth and genitals (private parts).  Wash genitals (private parts) with your normal soap.  6.  Wash thoroughly, paying special attention to the area where your surgery will be performed.  7.  Thoroughly rinse your body with warm water  from the neck down.  8.  DO NOT shower/wash with your normal soap after using and rinsing off the CHG Soap.  9.  Pat yourself dry with a clean towel.            10.  Wear clean pajamas.            11.  Place clean sheets on your bed the night of your first shower and do not sleep with pets.  Day of Surgery  Do not apply any lotions/deoderants the morning of surgery.   Please wear clean clothes to the hospital/surgery center. Remember to brush your teeth with toothpaste.     Please read over the following fact sheets that you were given. Pain Booklet, MRSA Information and Surgical Site Infection Prevention

## 2018-04-05 ENCOUNTER — Encounter (HOSPITAL_COMMUNITY): Payer: Self-pay

## 2018-04-05 ENCOUNTER — Encounter (HOSPITAL_COMMUNITY)
Admission: RE | Admit: 2018-04-05 | Discharge: 2018-04-05 | Disposition: A | Discharge status: Home / Self Care | Payer: Medicare Other | Source: Ambulatory Visit | Attending: Orthopedic Surgery | Admitting: Orthopedic Surgery

## 2018-04-05 DIAGNOSIS — M545 Low back pain: Secondary | ICD-10-CM | POA: Diagnosis not present

## 2018-04-05 DIAGNOSIS — G894 Chronic pain syndrome: Secondary | ICD-10-CM | POA: Insufficient documentation

## 2018-04-05 DIAGNOSIS — E119 Type 2 diabetes mellitus without complications: Secondary | ICD-10-CM | POA: Diagnosis not present

## 2018-04-05 DIAGNOSIS — M5136 Other intervertebral disc degeneration, lumbar region: Secondary | ICD-10-CM | POA: Insufficient documentation

## 2018-04-05 DIAGNOSIS — Z01818 Encounter for other preprocedural examination: Secondary | ICD-10-CM | POA: Insufficient documentation

## 2018-04-05 LAB — HEMOGLOBIN A1C
HEMOGLOBIN A1C: 5.4 % (ref 4.8–5.6)
MEAN PLASMA GLUCOSE: 108.28 mg/dL

## 2018-04-05 LAB — BASIC METABOLIC PANEL
ANION GAP: 6 (ref 5–15)
BUN: 17 mg/dL (ref 8–23)
CHLORIDE: 111 mmol/L (ref 98–111)
CO2: 24 mmol/L (ref 22–32)
Calcium: 8.9 mg/dL (ref 8.9–10.3)
Creatinine, Ser: 0.77 mg/dL (ref 0.44–1.00)
GFR calc Af Amer: >60 mL/min (ref >60)
GFR calc non Af Amer: >60 mL/min (ref >60)
GLUCOSE: 99 mg/dL (ref 70–99)
POTASSIUM: 3.4 mmol/L — AB (ref 3.5–5.1)
Sodium: 141 mmol/L (ref 135–145)

## 2018-04-05 LAB — CBC
HEMATOCRIT: 40.8 % (ref 36.0–46.0)
Hemoglobin: 12.8 g/dL (ref 12.0–15.0)
MCH: 27.2 pg (ref 26.0–34.0)
MCHC: 31.4 g/dL (ref 30.0–36.0)
MCV: 86.8 fL (ref 78.0–100.0)
Platelets: 247 10*3/uL (ref 150–400)
RBC: 4.7 MIL/uL (ref 3.87–5.11)
RDW: 13.6 % (ref 11.5–15.5)
WBC: 5.8 10*3/uL (ref 4.0–10.5)

## 2018-04-05 LAB — SURGICAL PCR SCREEN
MRSA, PCR: NEGATIVE
Staphylococcus aureus: POSITIVE — AB

## 2018-04-09 NOTE — H&P (Signed)
Patient ID: Andrea Norton MRN: [id number] DOB/AGE: 70/07/1945 70 y.o.  Admit date: (Not on file)  Admission Diagnoses:  Chronic Pain syndrome  HPI: The patient is here today for a pre-operative History and Physical. They are scheduled for Spinal Cord Stimulator on 04-12-18 with Dr. [facility]   Past Medical History: Past Medical History:  Diagnosis Date  . Abnormal stress test 01-22-2009   LOW RISK ADENOSINE NUCLEAR STUDY W/ PROBABLE MILD APICAL THINNING BUT NO ISCHEMIA  . Anxiety   . Arthritis    HANDS, KNEES  . B12 deficiency anemia   . Bronchitis    hx of   . Chronic back pain   . Chronic bronchitis (HCC)   . Chronic neck pain   . Coronary artery disease 2005   STRESS TEST /, NOte Dr Kirtland Bouchard EPIC/ no cardiologist now--STATES HEART STENT PLACED 2010  . DDD (degenerative disc disease), cervical   . DDD (degenerative disc disease), lumbar   . Depression    states well controlled  . Diabetes mellitus without complication (HCC)    pt states since having gastric banding does not have take medication was on metformin in past  . Diverticulitis of colon   . GERD (gastroesophageal reflux disease)   . H/O hiatal hernia   . Hemorrhoid   . Hemorrhoids   . History of urinary tract infection   . Hyperlipidemia   . Hypertension      PCP Dr Lysbeth Galas; pt currently on no medication for treatment   . Hypothyroidism   . Inguinal hernia, right    with ventral hernia  . Pneumonia    hx of walking pneumonia  . Radicular pain in left arm OCCASIONAL PAIN SHOOTING DOWN LEFT ARM SECAONDARY TO  CERVICAL DEGENERATION  . Sleep apnea    couldnt tolerate machine- states last sleep study " many years ago"  . Status post primary angioplasty with coronary stent    DRUG-ELUTING STENT X1  TO PROXIMAL LAD  . Synovitis of knee HYPERTROPHIC RIGHT KNEE  . Urinary incontinence   . Vitamin D deficiency     Surgical History: Past Surgical History:  Procedure Laterality  Date  . APPENDECTOMY  1993  . CATARACT EXTRACTION W/PHACO Right 05/06/2014   Procedure: CATARACT EXTRACTION PHACO AND INTRAOCULAR LENS PLACEMENT (IOC);  Surgeon: Loraine Leriche T. Nile Riggs, MD;  Location: [facility];  Service: Ophthalmology;  Laterality: Right;  CDE 6.41  . CATARACT EXTRACTION W/PHACO Left 05/20/2014   Procedure: CATARACT EXTRACTION PHACO AND INTRAOCULAR LENS PLACEMENT (IOC);  Surgeon: Loraine Leriche T. Nile Riggs, MD;  Location: [facility];  Service: Ophthalmology;  Laterality: Left;  CDE:4.42  . COLON SURGERY    . CORONARY ANGIOPLASTY  2004   DRUG-ELUTING STENT X1 TO PROXIMAL  LAD  . EXCISIONAL TOTAL KNEE ARTHROPLASTY WITH ANTIBIOTIC SPACERS Right 09/14/2015   Procedure: RIGHT TOTAL KNEE RESSECTION ARTHROPLASTY WITH ANTIBIOTIC SPACERS;  Surgeon: Ollen Gross, MD;  Location: [facility];  Service: Orthopedics;  Laterality: Right;  . EYE SURGERY    . HEMICOLECTOMY FOR DIVERTICULITIS  2005  . HYSTEROSCOPY W/D&C  2002  . I&D KNEE WITH POLY EXCHANGE  02/01/2012   Procedure: IRRIGATION AND DEBRIDEMENT KNEE WITH POLY EXCHANGE;  Surgeon: Loanne Drilling, MD;  Location: [facility];  Service: Orthopedics;  Laterality: Right;  Right Knee Polyethlene Revision   . IRRIGATION AND DEBRIDEMENT KNEE Right 03/10/2015   Procedure: IRRIGATION AND DEBRIDEMENT of right KNEE;  Surgeon: Ollen Gross, MD;  [facility]: [facility];  Service: Orthopedics;  Laterality: Right;  . JOINT REPLACEMENT     right knee   . KNEE ARTHROSCOPY  LEFT 1999   RIGHT 2004  . KNEE ARTHROSCOPY  08/03/2011   Procedure: ARTHROSCOPY KNEE;  Surgeon: Loanne DrillingFrank V Aluisio;  Location: [facility];  Service: Orthopedics;  Laterality: Right;  . KNEE ARTHROSCOPY Right 12/17/2014   Procedure: RIGHT ARTHROSCOPY KNEE, SYNOVECTOMY;  Surgeon: Ollen GrossFrank Aluisio, MD;  [facility];  Service: Orthopedics;  Laterality: Right;  . KNEE ARTHROSCOPY W/ DEBRIDEMENT  01-22-2010   SEPTIC KNEE  . KNEE CLOSED REDUCTION  05-07-2009  RIGHT KNEE   LEFT KNEE  10-01-2009  . LAPAROSCOPIC  GASTRIC SLEEVE RESECTION N/A 07/21/2014   Procedure: LAPAROSCOPIC GASTRIC SLEEVE RESECTION, TAKEDOWN OF INCARCERATED VENTRAL HERNIA, ENTERAL LYSIS, UPPER ENDOSCOPY;  Surgeon: Valarie MerinoMatthew B Martin, MD;  [facility];  Service: General;  Laterality: N/A;  . LEFT FOOT SURG.  1990  . OPEN PATELLOFEMORAL RIGHT KNEE/ LATERAL RELEASE  05-04-2009  . QUADRICEPS TENDON REPAIR Right 06/02/2015   Procedure: RIGHT KNEE QUADRICEP TENDON REPAIR ;  Surgeon: Ollen GrossFrank Aluisio, MD;  [facility];  Service: Orthopedics;  Laterality: Right;  . RIGHT KNEE I & D POLYTHYLENE REVISION  02-04-2010   KNEE INFECTED  . SYNOVECTOMY  08/03/2011   Procedure: SYNOVECTOMY;  Surgeon: Loanne DrillingFrank V Aluisio;  Location: [facility];  Service: Orthopedics;  Laterality: Right;  . TOTAL KNEE ARTHROPLASTY  LEFT  2005   RIGHT  03-13-2009  . TOTAL KNEE ARTHROPLASTY Right 11/23/2015   Procedure: RIGHT TOTAL KNEE ARTHROPLASTY REIMPLANTATION;  Surgeon: Ollen GrossFrank Aluisio, MD;  [facility];  Service: Orthopedics;  Laterality: Right;  . TOTAL KNEE REVISION  RIGHT 09-30-2009   LEFT 11-25-2009  . TOTAL KNEE REVISION Right 02/20/2013   Procedure: RIGHT TOTAL KNEE ARTHROPLASTY REVISION VERSUS RESECTION ARTHROPLASTY;  Surgeon: Loanne DrillingFrank V Aluisio, MD;  [facility];  Service: Orthopedics;  Laterality: Right;  . TOTAL KNEE REVISION Right 04/02/2014   Procedure: RIGHT KNEE FEMORAL ARTHROPLASTY REVISION;  Surgeon: Loanne DrillingFrank Aluisio V, MD;  [facility];  Service: Orthopedics;  Laterality: Right;    Family History: Family History  Problem Relation Age of Onset  . COPD Mother   . Cancer Father        lung    Social History: Social History   Socioeconomic History  . Marital status: Married    Spouse name: Not on file  . Number of children: Not on file  . Years of education: Not on file  . Highest education level: Not on file  Occupational History  . Not on file  Social Needs  . Financial resource strain: Not on file  . Food  insecurity:    Worry: Not on file    Inability: Not on file  . Transportation needs:    Medical: Not on file    Non-medical: Not on file  Tobacco Use  . Smoking status: Former Smoker    Packs/day: 1.00    Years: 30.00    Pack years: 30.00    Types: Cigarettes    Last attempt to quit: 07/31/2002    Years since quitting: 15.7  . Smokeless tobacco: Never Used  Substance and Sexual Activity  . Alcohol use: No  . Drug use: No  . Sexual activity: Not on file  Lifestyle  . Physical activity:    Days per week: Not on file    Minutes per session: Not on file  . Stress: Not on file  Relationships  . Social connections:    Talks on phone: Not on file    Gets together: Not on file    Attends religious service: Not on file    Active member of club or organization: Not on file    Attends meetings of clubs or organizations: Not on file    Relationship status: Not on file  . Intimate partner violence:    Fear of current or ex partner: Not on file    Emotionally abused: Not on file    Physically abused: Not on file    Forced sexual activity: Not on file  Other Topics Concern  . Not on file  Social History Narrative  . Not on file    Allergies: Morphine and related  Medications: I have reviewed the patient's current medications.  Vital Signs: No data found.  Radiology: No results found.  Labs: No results for input(s): WBC, RBC, HCT, PLT in the last 72 hours. No results for input(s): NA, K, CL, CO2, BUN, CREATININE, GLUCOSE, CALCIUM in the last 72 hours. No results for input(s): LABPT, INR in the last 72 hours.  Review of Systems: ROS  Physical Exam: There is no height or weight on file to calculate BMI.  Physical Exam  Constitutional: She is oriented to person, place, and time. She appears well-developed and well-nourished.  HENT:  Head: Normocephalic.  Cardiovascular: Normal rate and regular rhythm.  Respiratory: Effort normal and breath sounds normal.  GI: Soft.  Bowel sounds are normal.  Neurological: She is alert and oriented to person, place, and time.  Skin: Skin is warm and dry.  Psychiatric: She has a normal mood and affect. Her behavior is normal. Judgment and thought content normal.   Pt is overweight  tenderness to palpation is noted bilaterally Flexion decreases pain extension increases pain Numbness noted right lower extremity compared to the left Motor deficits noted right lower extremity.  Healed incisions from bilateral knee replacements is noted Patient's right knee is in a brace she does utilize a cane for ambulation Negative straight leg raise Reflexes symmetric Pulses symmetric   Assessment and Plan: Risks and benefits of surgery were discussed with the patient. These include: Infection, bleeding, death, stroke, paralysis, ongoing or worse pain, need for additional surgery, leak of spinal fluid, Failure of the battery requiring reoperation. Inability to place the paddle requiring the surgery to be aborted. Migration of the lead, failure to obtain results similar to the trial.  Goal of surgery: Reproduce pain relief obtained during the trial. Improved quality of life.  Anette Riedel, PAC for Venita Lick, MD [facility] [phone]

## 2018-04-11 ENCOUNTER — Encounter (HOSPITAL_COMMUNITY): Payer: Self-pay | Admitting: Anesthesiology

## 2018-04-13 ENCOUNTER — Emergency Department (HOSPITAL_COMMUNITY): Payer: Medicare Other

## 2018-04-13 ENCOUNTER — Inpatient Hospital Stay (HOSPITAL_COMMUNITY)
Admission: EM | Admit: 2018-04-13 | Discharge: 2018-04-19 | DRG: 853 | Disposition: A | Discharge status: Home Health | Payer: Medicare Other | Attending: Internal Medicine | Admitting: Internal Medicine

## 2018-04-13 ENCOUNTER — Other Ambulatory Visit: Payer: Self-pay

## 2018-04-13 ENCOUNTER — Emergency Department (HOSPITAL_COMMUNITY): Payer: Self-pay

## 2018-04-13 ENCOUNTER — Emergency Department (HOSPITAL_COMMUNITY)
Admission: EM | Admit: 2018-04-13 | Discharge: 2018-04-13 | Discharge status: Absent without leave | Payer: Medicare Other | Source: Home / Self Care | Attending: Emergency Medicine | Admitting: Emergency Medicine

## 2018-04-13 ENCOUNTER — Encounter (HOSPITAL_COMMUNITY): Payer: Self-pay | Admitting: Emergency Medicine

## 2018-04-13 DIAGNOSIS — N200 Calculus of kidney: Secondary | ICD-10-CM

## 2018-04-13 DIAGNOSIS — Z96653 Presence of artificial knee joint, bilateral: Secondary | ICD-10-CM | POA: Diagnosis present

## 2018-04-13 DIAGNOSIS — Z87440 Personal history of urinary (tract) infections: Secondary | ICD-10-CM

## 2018-04-13 DIAGNOSIS — I10 Essential (primary) hypertension: Secondary | ICD-10-CM | POA: Diagnosis present

## 2018-04-13 DIAGNOSIS — A4150 Gram-negative sepsis, unspecified: Secondary | ICD-10-CM

## 2018-04-13 DIAGNOSIS — I248 Other forms of acute ischemic heart disease: Secondary | ICD-10-CM | POA: Diagnosis present

## 2018-04-13 DIAGNOSIS — A419 Sepsis, unspecified organism: Secondary | ICD-10-CM | POA: Diagnosis present

## 2018-04-13 DIAGNOSIS — Z87891 Personal history of nicotine dependence: Secondary | ICD-10-CM

## 2018-04-13 DIAGNOSIS — Z79899 Other long term (current) drug therapy: Secondary | ICD-10-CM

## 2018-04-13 DIAGNOSIS — N390 Urinary tract infection, site not specified: Secondary | ICD-10-CM

## 2018-04-13 DIAGNOSIS — E876 Hypokalemia: Secondary | ICD-10-CM | POA: Diagnosis not present

## 2018-04-13 DIAGNOSIS — R0602 Shortness of breath: Secondary | ICD-10-CM | POA: Diagnosis not present

## 2018-04-13 DIAGNOSIS — Z6841 Body Mass Index (BMI) 40.0 and over, adult: Secondary | ICD-10-CM

## 2018-04-13 DIAGNOSIS — Z79890 Hormone replacement therapy (postmenopausal): Secondary | ICD-10-CM

## 2018-04-13 DIAGNOSIS — Z23 Encounter for immunization: Secondary | ICD-10-CM

## 2018-04-13 DIAGNOSIS — F329 Major depressive disorder, single episode, unspecified: Secondary | ICD-10-CM | POA: Diagnosis present

## 2018-04-13 DIAGNOSIS — E119 Type 2 diabetes mellitus without complications: Secondary | ICD-10-CM | POA: Diagnosis present

## 2018-04-13 DIAGNOSIS — E039 Hypothyroidism, unspecified: Secondary | ICD-10-CM | POA: Diagnosis present

## 2018-04-13 DIAGNOSIS — I2510 Atherosclerotic heart disease of native coronary artery without angina pectoris: Secondary | ICD-10-CM | POA: Diagnosis present

## 2018-04-13 DIAGNOSIS — E785 Hyperlipidemia, unspecified: Secondary | ICD-10-CM | POA: Diagnosis present

## 2018-04-13 DIAGNOSIS — R338 Other retention of urine: Secondary | ICD-10-CM | POA: Diagnosis present

## 2018-04-13 DIAGNOSIS — K219 Gastro-esophageal reflux disease without esophagitis: Secondary | ICD-10-CM | POA: Diagnosis present

## 2018-04-13 DIAGNOSIS — Z7982 Long term (current) use of aspirin: Secondary | ICD-10-CM

## 2018-04-13 DIAGNOSIS — G8929 Other chronic pain: Secondary | ICD-10-CM | POA: Diagnosis present

## 2018-04-13 DIAGNOSIS — Z955 Presence of coronary angioplasty implant and graft: Secondary | ICD-10-CM

## 2018-04-13 DIAGNOSIS — R6520 Severe sepsis without septic shock: Secondary | ICD-10-CM | POA: Diagnosis present

## 2018-04-13 DIAGNOSIS — J449 Chronic obstructive pulmonary disease, unspecified: Secondary | ICD-10-CM | POA: Diagnosis present

## 2018-04-13 DIAGNOSIS — G9341 Metabolic encephalopathy: Secondary | ICD-10-CM | POA: Diagnosis present

## 2018-04-13 DIAGNOSIS — D649 Anemia, unspecified: Secondary | ICD-10-CM | POA: Diagnosis present

## 2018-04-13 DIAGNOSIS — E559 Vitamin D deficiency, unspecified: Secondary | ICD-10-CM | POA: Diagnosis present

## 2018-04-13 DIAGNOSIS — Z1623 Resistance to quinolones and fluoroquinolones: Secondary | ICD-10-CM | POA: Diagnosis present

## 2018-04-13 DIAGNOSIS — R509 Fever, unspecified: Secondary | ICD-10-CM

## 2018-04-13 DIAGNOSIS — G4730 Sleep apnea, unspecified: Secondary | ICD-10-CM | POA: Diagnosis present

## 2018-04-13 DIAGNOSIS — N136 Pyonephrosis: Secondary | ICD-10-CM | POA: Diagnosis present

## 2018-04-13 LAB — CBC WITH DIFFERENTIAL/PLATELET
Basophils Absolute: 0 10*3/uL (ref 0.0–0.1)
Basophils Relative: 0 %
EOS ABS: 0.1 10*3/uL (ref 0.0–0.7)
Eosinophils Relative: 1 %
HCT: 38.5 % (ref 36.0–46.0)
Hemoglobin: 12.6 g/dL (ref 12.0–15.0)
LYMPHS ABS: 0.5 10*3/uL — AB (ref 0.7–4.0)
LYMPHS PCT: 5 %
MCH: 27.8 pg (ref 26.0–34.0)
MCHC: 32.7 g/dL (ref 30.0–36.0)
MCV: 84.8 fL (ref 78.0–100.0)
Monocytes Absolute: 0.5 10*3/uL (ref 0.1–1.0)
Monocytes Relative: 5 %
NEUTROS ABS: 8.5 10*3/uL — AB (ref 1.7–7.7)
Neutrophils Relative %: 89 %
Platelets: 180 10*3/uL (ref 150–400)
RBC: 4.54 MIL/uL (ref 3.87–5.11)
RDW: 14.1 % (ref 11.5–15.5)
WBC: 9.6 10*3/uL (ref 4.0–10.5)

## 2018-04-13 LAB — URINALYSIS, ROUTINE W REFLEX MICROSCOPIC
BILIRUBIN URINE: NEGATIVE
Glucose, UA: NEGATIVE mg/dL
KETONES UR: NEGATIVE mg/dL
NITRITE: NEGATIVE
Protein, ur: 30 mg/dL — AB
SPECIFIC GRAVITY, URINE: 1.01 (ref 1.005–1.030)
WBC, UA: >50 WBC/hpf — ABNORMAL HIGH (ref 0–5)
pH: 8 (ref 5.0–8.0)

## 2018-04-13 LAB — COMPREHENSIVE METABOLIC PANEL
ALT: 15 U/L (ref 0–44)
ANION GAP: 9 (ref 5–15)
AST: 23 U/L (ref 15–41)
Albumin: 3.3 g/dL — ABNORMAL LOW (ref 3.5–5.0)
Alkaline Phosphatase: 97 U/L (ref 38–126)
BUN: 13 mg/dL (ref 8–23)
CHLORIDE: 107 mmol/L (ref 98–111)
CO2: 22 mmol/L (ref 22–32)
CREATININE: 0.87 mg/dL (ref 0.44–1.00)
Calcium: 8.4 mg/dL — ABNORMAL LOW (ref 8.9–10.3)
Glucose, Bld: 133 mg/dL — ABNORMAL HIGH (ref 70–99)
Potassium: 3.1 mmol/L — ABNORMAL LOW (ref 3.5–5.1)
SODIUM: 138 mmol/L (ref 135–145)
Total Bilirubin: 2.3 mg/dL — ABNORMAL HIGH (ref 0.3–1.2)
Total Protein: 7 g/dL (ref 6.5–8.1)

## 2018-04-13 LAB — I-STAT TROPONIN, ED: Troponin i, poc: 0.11 ng/mL (ref 0.00–0.08)

## 2018-04-13 LAB — I-STAT CG4 LACTIC ACID, ED: Lactic Acid, Venous: 1.62 mmol/L (ref 0.5–1.9)

## 2018-04-13 LAB — INFLUENZA PANEL BY PCR (TYPE A & B)
Influenza A By PCR: NEGATIVE
Influenza B By PCR: NEGATIVE

## 2018-04-13 LAB — TROPONIN I: TROPONIN I: 0.16 ng/mL — AB (ref <0.03)

## 2018-04-13 LAB — LIPASE, BLOOD: Lipase: 23 U/L (ref 11–51)

## 2018-04-13 MED ORDER — CIPROFLOXACIN IN D5W 400 MG/200ML IV SOLN
400.0000 mg | Freq: Once | INTRAVENOUS | Status: AC
  Administered 2018-04-13: 400 mg via INTRAVENOUS
  Filled 2018-04-13: qty 200

## 2018-04-13 MED ORDER — SODIUM CHLORIDE 0.9 % IV BOLUS
1000.0000 mL | Freq: Once | INTRAVENOUS | Status: AC
  Administered 2018-04-13: 1000 mL via INTRAVENOUS

## 2018-04-13 MED ORDER — IOPAMIDOL (ISOVUE-300) INJECTION 61%
100.0000 mL | Freq: Once | INTRAVENOUS | Status: AC | PRN
  Administered 2018-04-13: 100 mL via INTRAVENOUS

## 2018-04-13 MED ORDER — SODIUM CHLORIDE 0.9 % IV BOLUS: 1000.0000 mL | Freq: Once | INTRAVENOUS | Status: DC

## 2018-04-13 MED ORDER — METRONIDAZOLE IN NACL 5-0.79 MG/ML-% IV SOLN
500.0000 mg | Freq: Three times a day (TID) | INTRAVENOUS | Status: DC
  Administered 2018-04-13: 500 mg via INTRAVENOUS
  Filled 2018-04-13: qty 100

## 2018-04-13 MED ORDER — FENTANYL CITRATE (PF) 100 MCG/2ML IJ SOLN
25.0000 ug | Freq: Once | INTRAMUSCULAR | Status: DC
  Filled 2018-04-13: qty 2

## 2018-04-13 MED ORDER — CIPROFLOXACIN IN D5W 400 MG/200ML IV SOLN
400.0000 mg | Freq: Once | INTRAVENOUS | Status: DC
  Filled 2018-04-13: qty 200

## 2018-04-13 MED ORDER — METRONIDAZOLE IN NACL 5-0.79 MG/ML-% IV SOLN
500.0000 mg | Freq: Three times a day (TID) | INTRAVENOUS | Status: DC
  Filled 2018-04-13: qty 100

## 2018-04-13 MED ORDER — FENTANYL CITRATE (PF) 100 MCG/2ML IJ SOLN
25.0000 ug | Freq: Once | INTRAMUSCULAR | Status: AC
  Administered 2018-04-13: 25 ug via INTRAVENOUS

## 2018-04-13 NOTE — ED Notes (Signed)
Date and time results received: 04/13/18 2237  Test: Troponin Critical Value: 0.16  Name of Provider Notified: EDP, Bero  Orders Received? Or Actions Taken?: n/a

## 2018-04-13 NOTE — ED Provider Notes (Addendum)
[facility] Emergency Department Provider Note MRN:  [id number]  Arrival date & time: 04/13/18     Chief Complaint   Fever  History of Present Illness   Andrea Norton is a 72 y.o. year-old female with a history of COPD, diabetes presenting to the ED with chief complaint of fever.  Continued fever for 2 days, associated with cold-like symptoms including cough, runny nose, sore throat.  Denies cough.  No chest pain or shortness of breath.  Has had UTIs in the past, having some dysuria currently.  Some mild confusion today noted by family.  Patient also endorsing right upper quadrant abdominal pain, constant, worse with palpation.  No vomiting or diarrhea.   Review of Systems  A complete 10 system review of systems was obtained and all systems are negative except as noted in the HPI and PMH.   Patient's Health History    Past Medical History:  Diagnosis Date  . Abnormal stress test 01-22-2009   LOW RISK ADENOSINE NUCLEAR STUDY W/ PROBABLE MILD APICAL THINNING BUT NO ISCHEMIA  . Anxiety   . Arthritis    HANDS, KNEES  . B12 deficiency anemia   . Bronchitis    hx of   . Chronic back pain   . Chronic bronchitis (HCC)   . Chronic neck pain   . Coronary artery disease 2005   STRESS TEST /, NOte Dr Kirtland BouchardHocherin EPIC/ no cardiologist now--STATES HEART STENT PLACED 2010  . DDD (degenerative disc disease), cervical   . DDD (degenerative disc disease), lumbar   . Depression    states well controlled  . Diabetes mellitus without complication (HCC)    pt states since having gastric banding does not have take medication was on metformin in past  . Diverticulitis of colon   . GERD (gastroesophageal reflux disease)   . H/O hiatal hernia   . Hemorrhoid   . Hemorrhoids   . History of urinary tract infection   . Hyperlipidemia   . Hypertension      PCP Dr Lysbeth GalasNyland; pt currently on no medication for treatment   . Hypothyroidism   . Inguinal hernia, right    with ventral  hernia  . Pneumonia    hx of walking pneumonia  . Radicular pain in left arm OCCASIONAL PAIN SHOOTING DOWN LEFT ARM SECAONDARY TO  CERVICAL DEGENERATION  . Sleep apnea    couldnt tolerate machine- states last sleep study " many years ago"  . Status post primary angioplasty with coronary stent    DRUG-ELUTING STENT X1  TO PROXIMAL LAD  . Synovitis of knee HYPERTROPHIC RIGHT KNEE  . Urinary incontinence   . Vitamin D deficiency     Past Surgical History:  Procedure Laterality Date  . APPENDECTOMY  1993  . CATARACT EXTRACTION W/PHACO Right 05/06/2014   Procedure: CATARACT EXTRACTION PHACO AND INTRAOCULAR LENS PLACEMENT (IOC);  Surgeon: Loraine LericheMark T. Nile RiggsShapiro, MD;  Location: [facility];  Service: Ophthalmology;  Laterality: Right;  CDE 6.41  . CATARACT EXTRACTION W/PHACO Left 05/20/2014   Procedure: CATARACT EXTRACTION PHACO AND INTRAOCULAR LENS PLACEMENT (IOC);  Surgeon: Loraine LericheMark T. Nile RiggsShapiro, MD;  Location: [facility];  Service: Ophthalmology;  Laterality: Left;  CDE:4.42  . COLON SURGERY    . CORONARY ANGIOPLASTY  2004   DRUG-ELUTING STENT X1 TO PROXIMAL  LAD  . EXCISIONAL TOTAL KNEE ARTHROPLASTY WITH ANTIBIOTIC SPACERS Right 09/14/2015   Procedure: RIGHT TOTAL KNEE RESSECTION ARTHROPLASTY WITH ANTIBIOTIC SPACERS;  Surgeon: Ollen GrossFrank Aluisio, MD;  [facility];  Service: Orthopedics;  Laterality: Right;  . EYE SURGERY    . HEMICOLECTOMY FOR DIVERTICULITIS  2005  . HYSTEROSCOPY W/D&C  2002  . I&D KNEE WITH POLY EXCHANGE  02/01/2012   Procedure: IRRIGATION AND DEBRIDEMENT KNEE WITH POLY EXCHANGE;  Surgeon: Loanne Drilling, MD;  [facility];  Service: Orthopedics;  Laterality: Right;  Right Knee Polyethlene Revision   . IRRIGATION AND DEBRIDEMENT KNEE Right 03/10/2015   Procedure: IRRIGATION AND DEBRIDEMENT of right KNEE;  Surgeon: Ollen Gross, MD;  [facility];  Service: Orthopedics;  Laterality: Right;  . JOINT REPLACEMENT     right knee   . KNEE ARTHROSCOPY  LEFT 1999   RIGHT 2004  . KNEE  ARTHROSCOPY  08/03/2011   Procedure: ARTHROSCOPY KNEE;  Surgeon: Loanne Drilling;  Location: [facility];  Service: Orthopedics;  Laterality: Right;  . KNEE ARTHROSCOPY Right 12/17/2014   Procedure: RIGHT ARTHROSCOPY KNEE, SYNOVECTOMY;  Surgeon: Ollen Gross, MD;  [facility];  Service: Orthopedics;  Laterality: Right;  . KNEE ARTHROSCOPY W/ DEBRIDEMENT  01-22-2010   SEPTIC KNEE  . KNEE CLOSED REDUCTION  05-07-2009  RIGHT KNEE   LEFT KNEE  10-01-2009  . LAPAROSCOPIC GASTRIC SLEEVE RESECTION N/A 07/21/2014   Procedure: LAPAROSCOPIC GASTRIC SLEEVE RESECTION, TAKEDOWN OF INCARCERATED VENTRAL HERNIA, ENTERAL LYSIS, UPPER ENDOSCOPY;  Surgeon: Valarie Merino, MD;  [facility];  Service: General;  Laterality: N/A;  . LEFT FOOT SURG.  1990  . OPEN PATELLOFEMORAL RIGHT KNEE/ LATERAL RELEASE  05-04-2009  . QUADRICEPS TENDON REPAIR Right 06/02/2015   Procedure: RIGHT KNEE QUADRICEP TENDON REPAIR ;  Surgeon: Ollen Gross, MD;  [facility];  Service: Orthopedics;  Laterality: Right;  . RIGHT KNEE I & D POLYTHYLENE REVISION  02-04-2010   KNEE INFECTED  . SYNOVECTOMY  08/03/2011   Procedure: SYNOVECTOMY;  Surgeon: Loanne Drilling;  Location: [facility];  Service: Orthopedics;  Laterality: Right;  . TOTAL KNEE ARTHROPLASTY  LEFT  2005   RIGHT  03-13-2009  . TOTAL KNEE ARTHROPLASTY Right 11/23/2015   Procedure: RIGHT TOTAL KNEE ARTHROPLASTY REIMPLANTATION;  Surgeon: Ollen Gross, MD;  [facility];  Service: Orthopedics;  Laterality: Right;  . TOTAL KNEE REVISION  RIGHT 09-30-2009   LEFT 11-25-2009  . TOTAL KNEE REVISION Right 02/20/2013   Procedure: RIGHT TOTAL KNEE ARTHROPLASTY REVISION VERSUS RESECTION ARTHROPLASTY;  Surgeon: Loanne Drilling, MD;  [facility];  Service: Orthopedics;  Laterality: Right;  . TOTAL KNEE REVISION Right 04/02/2014   Procedure: RIGHT KNEE FEMORAL ARTHROPLASTY REVISION;  Surgeon: Loanne Drilling, MD;  [facility];  Service:  Orthopedics;  Laterality: Right;    Family History  Problem Relation Age of Onset  . COPD Mother   . Cancer Father        lung    Social History   Socioeconomic History  . Marital status: Married    Spouse name: Not on file  . Number of children: Not on file  . Years of education: Not on file  . Highest education level: Not on file  Occupational History  . Not on file  Social Needs  . Financial resource strain: Not on file  . Food insecurity:    Worry: Not on file    Inability: Not on file  . Transportation needs:    Medical: Not on file    Non-medical: Not on file  Tobacco Use  . Smoking status: Former Smoker    Packs/day: 1.00  Years: 30.00    Pack years: 30.00    Types: Cigarettes    Last attempt to quit: 07/31/2002    Years since quitting: 15.7  . Smokeless tobacco: Never Used  Substance and Sexual Activity  . Alcohol use: No  . Drug use: No  . Sexual activity: Not on file  Lifestyle  . Physical activity:    Days per week: Not on file    Minutes per session: Not on file  . Stress: Not on file  Relationships  . Social connections:    Talks on phone: Not on file    Gets together: Not on file    Attends religious service: Not on file    Active member of club or organization: Not on file    Attends meetings of clubs or organizations: Not on file    Relationship status: Not on file  . Intimate partner violence:    Fear of current or ex partner: Not on file    Emotionally abused: Not on file    Physically abused: Not on file    Forced sexual activity: Not on file  Other Topics Concern  . Not on file  Social History Narrative  . Not on file     Physical Exam  Vital Signs and Nursing Notes reviewed Vitals:   04/13/18 2100  BP: 113/66  Resp: (!) 22    CONSTITUTIONAL:  well-appearing, NAD NEURO:  Alert and oriented x 3, no focal deficits EYES:  eyes equal and reactive ENT/NECK:  no LAD, no JVD CARDIO:  tachycardic rate, well-perfused, normal S1 and  S2 PULM:  CTAB no wheezing or rhonchi GI/GU:  normal bowel sounds, non-distended, moderate diffuse tenderness, most focal in the right upper quadrant MSK/SPINE:  No gross deformities, no edema SKIN:  no rash, atraumatic PSYCH:  Appropriate speech and behavior  Diagnostic and Interventional Summary    EKG Interpretation  Date/Time:  Friday April 13 2018 21:14:10 EDT Ventricular Rate:  98 PR Interval:    QRS Duration: 115 QT Interval:  383 QTC Calculation: 489 R Axis:   14 Text Interpretation:  Sinus tachycardia Multiform ventricular premature complexes Probable left ventricular hypertrophy Borderline T abnormalities, inferior leads Borderline prolonged QT interval Confirmed by Kennis Carina [phone]) on 04/13/2018 10:10:47 PM      Labs Reviewed  COMPREHENSIVE METABOLIC PANEL - Abnormal; Notable for the following components:      Result Value   Potassium 3.1 (*)    Glucose, Bld 133 (*)    Calcium 8.4 (*)    Albumin 3.3 (*)    Total Bilirubin 2.3 (*)    All other components within normal limits  CBC WITH DIFFERENTIAL/PLATELET - Abnormal; Notable for the following components:   Neutro Abs 8.5 (*)    Lymphs Abs 0.5 (*)    All other components within normal limits  URINALYSIS, ROUTINE W REFLEX MICROSCOPIC - Abnormal; Notable for the following components:   APPearance HAZY (*)    Hgb urine dipstick MODERATE (*)    Protein, ur 30 (*)    Leukocytes, UA MODERATE (*)    WBC, UA >50 (*)    Bacteria, UA RARE (*)    All other components within normal limits  TROPONIN I - Abnormal; Notable for the following components:   Troponin I 0.16 (*)    All other components within normal limits  CULTURE, BLOOD (ROUTINE X 2)  CULTURE, BLOOD (ROUTINE X 2)  LIPASE, BLOOD  INFLUENZA PANEL BY PCR (TYPE A & B)  I-STAT CG4 LACTIC ACID, ED  I-STAT TROPONIN, ED    CT ABDOMEN PELVIS W CONTRAST  Final Result    DG Chest 1 View  Final Result      Medications  metroNIDAZOLE (FLAGYL) IVPB 500  mg (500 mg Intravenous New Bag/Given 04/13/18 2217)  sodium chloride 0.9 % bolus 1,000 mL (1,000 mLs Intravenous New Bag/Given 04/13/18 2209)  sodium chloride 0.9 % bolus 1,000 mL (1,000 mLs Intravenous New Bag/Given 04/13/18 2214)  ciprofloxacin (CIPRO) IVPB 400 mg (400 mg Intravenous New Bag/Given 04/13/18 2230)  fentaNYL (SUBLIMAZE) injection 25 mcg (25 mcg Intravenous Given 04/13/18 2231)  iopamidol (ISOVUE-300) 61 % injection 100 mL (100 mLs Intravenous Contrast Given 04/13/18 2302)     Procedures Critical Care Critical Care Documentation Critical care time provided by me (excluding procedures): 33 minutes  Condition necessitating critical care: Sepsis  Components of critical care management: reviewing of prior records, laboratory and imaging interpretation, frequent re-examination and reassessment of vital signs, administration of IV fluid resuscitation, IV antibiotics.  ED Course and Medical Decision Making  I have reviewed the triage vital signs and the nursing notes.  Pertinent labs & imaging results that were available during my care of the patient were reviewed by me and considered in my medical decision making (see below for details).  Concern for sepsis in the 72 year old female, history of diabetes, moderate right upper quadrant tenderness, dysuria, cold-like symptoms.  Source unclear, work-up pending.  Troponin 0.16, favored type II given the on concerning EKG.  CT pending.  Anticipating admission to hospital service after CT exclude surgical process.   CT concerning for obstructing right kidney stone, UA with evidence to suggest infection.  Consulted urology, who will come to any pen to place nephrostomy tube.  Recommending admission to hospitalist service.  Accepted by hospitalist service for admission.  Elmer Sow. Pilar Plate, MD [facility] Emergency Medicine [facility] [email or web address]  Final Clinical Impressions(s) / ED Diagnoses     ICD-10-CM   1.  Sepsis, due to unspecified organism (HCC) A41.9   2. Kidney stone N20.0     ED Discharge Orders    None         Sabas Sous, MD 04/13/18 2303    Sabas Sous, MD 04/13/18 [phone]

## 2018-04-13 NOTE — ED Notes (Signed)
Patient transported to CT 

## 2018-04-13 NOTE — ED Triage Notes (Signed)
Patient lethargic and has had flu like symptoms x 2 days, EMS reports fever oral 104.4. Family states that pt has been lethargic most of the day

## 2018-04-13 NOTE — ED Triage Notes (Signed)
Patient lethargic and had flu like symptoms x 2 days. EMS reports fever oral 104.4. Family states that patient has been lethargic most of the day.

## 2018-04-13 NOTE — ED Notes (Signed)
CBC collected  

## 2018-04-13 NOTE — ED Notes (Signed)
EKG was done at 2114.

## 2018-04-13 NOTE — ED Notes (Signed)
ED EKG completed at 2114

## 2018-04-13 NOTE — ED Notes (Signed)
I-Stat Lactic completed

## 2018-04-13 NOTE — ED Notes (Signed)
C-Met, Protime-INR, Blood Cultures, UA collected

## 2018-04-13 NOTE — ED Notes (Signed)
This pt was incorrectly arrived and orders entered today at 2033, correct pt has now been roomed and this pt taken off the floor, all information that can removed from this pt charted being deleted out and an e-mail will be sent for chart correction, this pt being dismissed as never arrived/arrived in error for visit 04/13/2018 at 2033

## 2018-04-14 ENCOUNTER — Encounter (HOSPITAL_COMMUNITY)
Admission: EM | Disposition: A | Discharge status: Home Health | Payer: Self-pay | Source: Home / Self Care | Attending: Internal Medicine

## 2018-04-14 ENCOUNTER — Emergency Department (HOSPITAL_COMMUNITY): Payer: Medicare Other | Admitting: Anesthesiology

## 2018-04-14 ENCOUNTER — Emergency Department (HOSPITAL_COMMUNITY): Payer: Medicare Other

## 2018-04-14 ENCOUNTER — Encounter (HOSPITAL_COMMUNITY): Payer: Self-pay | Admitting: Anesthesiology

## 2018-04-14 DIAGNOSIS — Z23 Encounter for immunization: Secondary | ICD-10-CM | POA: Diagnosis not present

## 2018-04-14 DIAGNOSIS — Z79890 Hormone replacement therapy (postmenopausal): Secondary | ICD-10-CM | POA: Diagnosis not present

## 2018-04-14 DIAGNOSIS — Z96653 Presence of artificial knee joint, bilateral: Secondary | ICD-10-CM | POA: Diagnosis present

## 2018-04-14 DIAGNOSIS — Z955 Presence of coronary angioplasty implant and graft: Secondary | ICD-10-CM | POA: Diagnosis not present

## 2018-04-14 DIAGNOSIS — Z7982 Long term (current) use of aspirin: Secondary | ICD-10-CM | POA: Diagnosis not present

## 2018-04-14 DIAGNOSIS — R0602 Shortness of breath: Secondary | ICD-10-CM | POA: Diagnosis present

## 2018-04-14 DIAGNOSIS — E032 Hypothyroidism due to medicaments and other exogenous substances: Secondary | ICD-10-CM | POA: Diagnosis not present

## 2018-04-14 DIAGNOSIS — I340 Nonrheumatic mitral (valve) insufficiency: Secondary | ICD-10-CM | POA: Diagnosis not present

## 2018-04-14 DIAGNOSIS — I2510 Atherosclerotic heart disease of native coronary artery without angina pectoris: Secondary | ICD-10-CM | POA: Diagnosis not present

## 2018-04-14 DIAGNOSIS — Z87891 Personal history of nicotine dependence: Secondary | ICD-10-CM | POA: Diagnosis not present

## 2018-04-14 DIAGNOSIS — E559 Vitamin D deficiency, unspecified: Secondary | ICD-10-CM | POA: Diagnosis present

## 2018-04-14 DIAGNOSIS — G4730 Sleep apnea, unspecified: Secondary | ICD-10-CM | POA: Diagnosis not present

## 2018-04-14 DIAGNOSIS — N390 Urinary tract infection, site not specified: Secondary | ICD-10-CM

## 2018-04-14 DIAGNOSIS — G9341 Metabolic encephalopathy: Secondary | ICD-10-CM | POA: Diagnosis not present

## 2018-04-14 DIAGNOSIS — E785 Hyperlipidemia, unspecified: Secondary | ICD-10-CM | POA: Diagnosis not present

## 2018-04-14 DIAGNOSIS — N200 Calculus of kidney: Secondary | ICD-10-CM

## 2018-04-14 DIAGNOSIS — Z6841 Body Mass Index (BMI) 40.0 and over, adult: Secondary | ICD-10-CM | POA: Diagnosis not present

## 2018-04-14 DIAGNOSIS — N136 Pyonephrosis: Secondary | ICD-10-CM | POA: Diagnosis not present

## 2018-04-14 DIAGNOSIS — A419 Sepsis, unspecified organism: Secondary | ICD-10-CM | POA: Diagnosis present

## 2018-04-14 DIAGNOSIS — Z87440 Personal history of urinary (tract) infections: Secondary | ICD-10-CM | POA: Diagnosis not present

## 2018-04-14 DIAGNOSIS — Z79899 Other long term (current) drug therapy: Secondary | ICD-10-CM | POA: Diagnosis not present

## 2018-04-14 DIAGNOSIS — I248 Other forms of acute ischemic heart disease: Secondary | ICD-10-CM | POA: Diagnosis not present

## 2018-04-14 DIAGNOSIS — A4150 Gram-negative sepsis, unspecified: Secondary | ICD-10-CM | POA: Diagnosis not present

## 2018-04-14 DIAGNOSIS — E119 Type 2 diabetes mellitus without complications: Secondary | ICD-10-CM | POA: Diagnosis present

## 2018-04-14 DIAGNOSIS — F329 Major depressive disorder, single episode, unspecified: Secondary | ICD-10-CM | POA: Diagnosis present

## 2018-04-14 DIAGNOSIS — E039 Hypothyroidism, unspecified: Secondary | ICD-10-CM | POA: Diagnosis not present

## 2018-04-14 DIAGNOSIS — G8929 Other chronic pain: Secondary | ICD-10-CM | POA: Diagnosis present

## 2018-04-14 DIAGNOSIS — I10 Essential (primary) hypertension: Secondary | ICD-10-CM | POA: Diagnosis present

## 2018-04-14 DIAGNOSIS — R338 Other retention of urine: Secondary | ICD-10-CM | POA: Diagnosis present

## 2018-04-14 HISTORY — PX: CYSTOSCOPY WITH STENT PLACEMENT: SHX5790

## 2018-04-14 LAB — BLOOD CULTURE ID PANEL (REFLEXED)
Acinetobacter baumannii: NOT DETECTED
CARBAPENEM RESISTANCE: NOT DETECTED
Candida albicans: NOT DETECTED
Candida glabrata: NOT DETECTED
Candida krusei: NOT DETECTED
Candida parapsilosis: NOT DETECTED
Candida tropicalis: NOT DETECTED
Enterobacter cloacae complex: NOT DETECTED
Enterobacteriaceae species: DETECTED — AB
Enterococcus species: NOT DETECTED
Escherichia coli: DETECTED — AB
HAEMOPHILUS INFLUENZAE: NOT DETECTED
Klebsiella oxytoca: NOT DETECTED
Klebsiella pneumoniae: NOT DETECTED
Listeria monocytogenes: NOT DETECTED
NEISSERIA MENINGITIDIS: NOT DETECTED
Proteus species: NOT DETECTED
Pseudomonas aeruginosa: NOT DETECTED
SERRATIA MARCESCENS: NOT DETECTED
STAPHYLOCOCCUS AUREUS BCID: NOT DETECTED
STAPHYLOCOCCUS SPECIES: NOT DETECTED
STREPTOCOCCUS AGALACTIAE: NOT DETECTED
Streptococcus pneumoniae: NOT DETECTED
Streptococcus pyogenes: NOT DETECTED
Streptococcus species: NOT DETECTED

## 2018-04-14 LAB — TROPONIN I
TROPONIN I: 0.14 ng/mL — AB (ref <0.03)
Troponin I: 0.16 ng/mL (ref <0.03)
Troponin I: 0.14 ng/mL (ref <0.03)

## 2018-04-14 LAB — CBC
HCT: 35.6 % — ABNORMAL LOW (ref 36.0–46.0)
Hemoglobin: 11.5 g/dL — ABNORMAL LOW (ref 12.0–15.0)
MCH: 27.5 pg (ref 26.0–34.0)
MCHC: 32.3 g/dL (ref 30.0–36.0)
MCV: 85.2 fL (ref 78.0–100.0)
PLATELETS: 172 10*3/uL (ref 150–400)
RBC: 4.18 MIL/uL (ref 3.87–5.11)
RDW: 14.2 % (ref 11.5–15.5)
WBC: 14.4 10*3/uL — AB (ref 4.0–10.5)

## 2018-04-14 LAB — CREATININE, SERUM
CREATININE: 0.8 mg/dL (ref 0.44–1.00)
GFR calc Af Amer: >60 mL/min (ref >60)
GFR calc non Af Amer: >60 mL/min (ref >60)

## 2018-04-14 SURGERY — CYSTOSCOPY, WITH STENT INSERTION
Anesthesia: General | Laterality: Right

## 2018-04-14 MED ORDER — PANTOPRAZOLE SODIUM 40 MG PO TBEC
40.0000 mg | DELAYED_RELEASE_TABLET | Freq: Every day | ORAL | Status: DC
  Administered 2018-04-14 – 2018-04-19 (×6): 40 mg via ORAL
  Filled 2018-04-14 (×6): qty 1

## 2018-04-14 MED ORDER — LEVOTHYROXINE SODIUM 25 MCG PO TABS
125.0000 ug | ORAL_TABLET | Freq: Every day | ORAL | Status: DC
  Administered 2018-04-14 – 2018-04-19 (×6): 125 ug via ORAL
  Filled 2018-04-14 (×7): qty 1

## 2018-04-14 MED ORDER — ENOXAPARIN SODIUM 40 MG/0.4ML ~~LOC~~ SOLN
SUBCUTANEOUS | Status: AC
  Filled 2018-04-14: qty 0.4

## 2018-04-14 MED ORDER — ONDANSETRON HCL 4 MG/2ML IJ SOLN: 4.0000 mg | Freq: Once | INTRAMUSCULAR | Status: DC | PRN

## 2018-04-14 MED ORDER — PROPOFOL 10 MG/ML IV BOLUS
INTRAVENOUS | Status: AC
  Filled 2018-04-14: qty 20

## 2018-04-14 MED ORDER — DIATRIZOATE MEGLUMINE 30 % UR SOLN
URETHRAL | Status: AC
  Filled 2018-04-14: qty 100

## 2018-04-14 MED ORDER — SODIUM CHLORIDE 0.9 % IV SOLN
INTRAVENOUS | Status: DC
  Administered 2018-04-14 – 2018-04-17 (×6): via INTRAVENOUS

## 2018-04-14 MED ORDER — DIATRIZOATE MEGLUMINE 30 % UR SOLN
URETHRAL | Status: DC | PRN
  Administered 2018-04-14: 10 mL via URETHRAL

## 2018-04-14 MED ORDER — METHOCARBAMOL 500 MG PO TABS
500.0000 mg | ORAL_TABLET | Freq: Three times a day (TID) | ORAL | Status: DC | PRN
  Administered 2018-04-16: 500 mg via ORAL
  Filled 2018-04-14: qty 1

## 2018-04-14 MED ORDER — ONDANSETRON HCL 4 MG/2ML IJ SOLN: 4.0000 mg | Freq: Four times a day (QID) | INTRAMUSCULAR | Status: DC | PRN

## 2018-04-14 MED ORDER — GABAPENTIN 300 MG PO CAPS
300.0000 mg | ORAL_CAPSULE | Freq: Two times a day (BID) | ORAL | Status: DC
  Administered 2018-04-14 – 2018-04-19 (×11): 300 mg via ORAL
  Filled 2018-04-14 (×11): qty 1

## 2018-04-14 MED ORDER — MEPERIDINE HCL 50 MG/ML IJ SOLN: 6.2500 mg | INTRAMUSCULAR | Status: DC | PRN

## 2018-04-14 MED ORDER — KETOROLAC TROMETHAMINE 30 MG/ML IJ SOLN
INTRAMUSCULAR | Status: AC
  Filled 2018-04-14: qty 1

## 2018-04-14 MED ORDER — ASPIRIN EC 81 MG PO TBEC
81.0000 mg | DELAYED_RELEASE_TABLET | Freq: Every day | ORAL | Status: DC
  Administered 2018-04-14 – 2018-04-18 (×5): 81 mg via ORAL
  Filled 2018-04-14 (×5): qty 1

## 2018-04-14 MED ORDER — CIPROFLOXACIN IN D5W 200 MG/100ML IV SOLN
200.0000 mg | Freq: Two times a day (BID) | INTRAVENOUS | Status: DC
  Administered 2018-04-14 – 2018-04-15 (×3): 200 mg via INTRAVENOUS
  Filled 2018-04-14 (×5): qty 100

## 2018-04-14 MED ORDER — POTASSIUM CHLORIDE CRYS ER 20 MEQ PO TBCR
40.0000 meq | EXTENDED_RELEASE_TABLET | ORAL | Status: AC
  Administered 2018-04-14 (×2): 40 meq via ORAL
  Filled 2018-04-14: qty 2
  Filled 2018-04-14: qty 4
  Filled 2018-04-14 (×2): qty 2

## 2018-04-14 MED ORDER — ACETAMINOPHEN 325 MG PO TABS
650.0000 mg | ORAL_TABLET | Freq: Four times a day (QID) | ORAL | Status: DC | PRN
  Administered 2018-04-14 – 2018-04-17 (×7): 650 mg via ORAL
  Filled 2018-04-14 (×7): qty 2

## 2018-04-14 MED ORDER — HYDROCODONE-ACETAMINOPHEN 7.5-325 MG PO TABS: 1.0000 | ORAL_TABLET | Freq: Once | ORAL | Status: DC | PRN

## 2018-04-14 MED ORDER — ONDANSETRON HCL 4 MG PO TABS: 4.0000 mg | ORAL_TABLET | Freq: Four times a day (QID) | ORAL | Status: DC | PRN

## 2018-04-14 MED ORDER — LACTATED RINGERS IV SOLN
INTRAVENOUS | Status: DC
  Administered 2018-04-14: 02:00:00 via INTRAVENOUS

## 2018-04-14 MED ORDER — INFLUENZA VAC SPLIT HIGH-DOSE 0.5 ML IM SUSY
0.5000 mL | PREFILLED_SYRINGE | INTRAMUSCULAR | Status: AC
  Administered 2018-04-15: 0.5 mL via INTRAMUSCULAR
  Filled 2018-04-14: qty 0.5

## 2018-04-14 MED ORDER — PROPOFOL 10 MG/ML IV BOLUS
INTRAVENOUS | Status: DC | PRN
  Administered 2018-04-14: 100 mg via INTRAVENOUS

## 2018-04-14 MED ORDER — ENOXAPARIN SODIUM 40 MG/0.4ML ~~LOC~~ SOLN
40.0000 mg | SUBCUTANEOUS | Status: DC
  Administered 2018-04-14 – 2018-04-19 (×6): 40 mg via SUBCUTANEOUS
  Filled 2018-04-14 (×6): qty 0.4

## 2018-04-14 MED ORDER — SODIUM CHLORIDE 0.9 % IR SOLN
Status: DC | PRN
  Administered 2018-04-14: 3000 mL

## 2018-04-14 MED ORDER — HYDROMORPHONE HCL 1 MG/ML IJ SOLN: 0.2500 mg | INTRAMUSCULAR | Status: DC | PRN

## 2018-04-14 MED ORDER — KETOROLAC TROMETHAMINE 30 MG/ML IJ SOLN
30.0000 mg | Freq: Once | INTRAMUSCULAR | Status: AC | PRN
  Administered 2018-04-14: 30 mg via INTRAVENOUS

## 2018-04-14 MED ORDER — SIMVASTATIN 20 MG PO TABS
40.0000 mg | ORAL_TABLET | Freq: Every day | ORAL | Status: DC
  Administered 2018-04-14 – 2018-04-19 (×4): 40 mg via ORAL
  Filled 2018-04-14 (×5): qty 2

## 2018-04-14 MED ORDER — KETOROLAC TROMETHAMINE 15 MG/ML IJ SOLN
15.0000 mg | Freq: Three times a day (TID) | INTRAMUSCULAR | Status: DC | PRN
  Administered 2018-04-14 – 2018-04-15 (×3): 15 mg via INTRAVENOUS
  Filled 2018-04-14 (×3): qty 1

## 2018-04-14 MED ORDER — ESCITALOPRAM OXALATE 10 MG PO TABS
10.0000 mg | ORAL_TABLET | Freq: Every day | ORAL | Status: DC
  Administered 2018-04-14 – 2018-04-19 (×5): 10 mg via ORAL
  Filled 2018-04-14 (×6): qty 1

## 2018-04-14 MED ORDER — 0.9 % SODIUM CHLORIDE (POUR BTL) OPTIME
TOPICAL | Status: DC | PRN
  Administered 2018-04-14: 1000 mL

## 2018-04-14 SURGICAL SUPPLY — 19 items
BAG DRAIN URO TABLE W/ADPT NS (BAG) ×2 IMPLANT
BAG DRN 8 ADPR NS SKTRN CSTL (BAG) ×1
CATH INTERMIT  6FR 70CM (CATHETERS) ×2 IMPLANT
CLOTH BEACON ORANGE TIMEOUT ST (SAFETY) ×2 IMPLANT
GLOVE BIOGEL M 6.5 STRL (GLOVE) ×1 IMPLANT
GLOVE BIOGEL M 8.0 STRL (GLOVE) ×2 IMPLANT
GLOVE BIOGEL PI IND STRL 6.5 (GLOVE) IMPLANT
GLOVE BIOGEL PI INDICATOR 6.5 (GLOVE) ×1
GOWN STRL REUS W/ TWL LRG LVL3 (GOWN DISPOSABLE) ×1 IMPLANT
GOWN STRL REUS W/TWL LRG LVL3 (GOWN DISPOSABLE) ×2
GOWN STRL REUS W/TWL XL LVL3 (GOWN DISPOSABLE) ×2 IMPLANT
GUIDEWIRE STR DUAL SENSOR (WIRE) IMPLANT
KIT TURNOVER CYSTO (KITS) ×2 IMPLANT
MANIFOLD NEPTUNE II (INSTRUMENTS) ×2 IMPLANT
NS IRRIG 500ML POUR BTL (IV SOLUTION) IMPLANT
PACK CYSTO (CUSTOM PROCEDURE TRAY) ×2 IMPLANT
PAD ARMBOARD 7.5X6 YLW CONV (MISCELLANEOUS) ×2 IMPLANT
STENT URET 6FRX24 CONTOUR (STENTS) ×1 IMPLANT
TOWEL OR 17X26 4PK STRL BLUE (TOWEL DISPOSABLE) ×2 IMPLANT

## 2018-04-14 NOTE — Anesthesia Preprocedure Evaluation (Signed)
Anesthesia Evaluation  Patient identified by MRN, date of birth, ID band Patient awake    Reviewed: Allergy & Precautions, H&P , NPO status , Patient's Chart, lab work & pertinent test results  Airway Mallampati: II  TM Distance: >3 FB Neck ROM: full    Dental no notable dental hx.    Pulmonary neg pulmonary ROS, shortness of breath, sleep apnea , pneumonia, former smoker,    Pulmonary exam normal breath sounds clear to auscultation       Cardiovascular Exercise Tolerance: Good hypertension, + CAD  negative cardio ROS   Rhythm:regular Rate:Normal     Neuro/Psych PSYCHIATRIC DISORDERS Anxiety Depression negative neurological ROS  negative psych ROS   GI/Hepatic negative GI ROS, Neg liver ROS, hiatal hernia, GERD  ,  Endo/Other  negative endocrine ROSdiabetesHypothyroidism   Renal/GU negative Renal ROS  negative genitourinary   Musculoskeletal   Abdominal   Peds  Hematology negative hematology ROS (+) anemia ,   Anesthesia Other Findings   Reproductive/Obstetrics negative OB ROS                             Anesthesia Physical Anesthesia Plan  ASA: III and emergent  Anesthesia Plan: General   Post-op Pain Management:    Induction:   PONV Risk Score and Plan:   Airway Management Planned:   Additional Equipment:   Intra-op Plan:   Post-operative Plan:   Informed Consent: I have reviewed the patients History and Physical, chart, labs and discussed the procedure including the risks, benefits and alternatives for the proposed anesthesia with the patient or authorized representative who has indicated his/her understanding and acceptance.   Dental Advisory Given  Plan Discussed with:   Anesthesia Plan Comments:         Anesthesia Quick Evaluation

## 2018-04-14 NOTE — OR Nursing (Signed)
Patient stable and awaiting admission orders from Dr Sharl MaLama before she can be admitted to floor.

## 2018-04-14 NOTE — Op Note (Signed)
Preop diagnosis: Infected right renal calculus  Postoperative diagnosis: Same  Principal procedure: Cystoscopy, retrograde ureteral pyelogram on right, placement of right double-J stent (24 cm x 6 French contour without tether), fluoroscopic interpretation  Surgeon: Bernadett Milian  Anesthesia: General with LMA  Complications: None  Estimated blood loss: None  Drains: 16 French Foley, above mentioned stent  Specimen: None  Indications: 72 year old female with large right renal calculus with infection and hydronephrosis.  She presents at this time to the operating room for management with double-J stent.  I discussed the procedure with the patient and her husband who understand and desire to proceed.  Findings: Meatal stenosis was dilated with the beak of the 21 [place]French scope.  Bladder urothelium appeared normal.  Right ureteral orifice was somewhat laterally placed and oval in shape.  Left ureteral orifice not identified.  Retrograde ureteral pyelogram performed with Omnipaque revealed a mildly tortuous ureter with normal caliber with filling defect in the right renal pelvic area with hydronephrosis.  Description of procedure: The patient was properly identified and marked.  She received preoperative IV antibiotics.  She was taken to the operating room where general anesthetic was administered.  She was placed in the dorsolithotomy position.  Genitalia and perineum were prepped and draped.  Proper timeout was performed.  The cystoscope was advanced into the bladder.  Inspection was carried out.  I then perform retrograde ureteral pyelogram with the above-mentioned findings.  Following this, sensor tip guidewire was advanced through the open-ended catheter into the upper pole calyceal system using fluoroscopic guidance.  The open-ended catheter was removed.  I then placed a 6 [place] by 24 cm contour double-J stent.  Once adequately positioned it was deployed in the ureter with excellent proximal and  distal curl seen using fluoroscopy and cystoscopy, respectively.  The bladder was drained.  Foley catheter was placed.  The procedure was then terminated.  She was awakened and taken to the PACU in stable condition, having tolerated the procedure well.

## 2018-04-14 NOTE — Transfer of Care (Signed)
Immediate Anesthesia Transfer of Care Note  Patient: Andrea Norton  Procedure(s) Performed: CYSTOSCOPY WITH STENT PLACEMENT (Right )  Patient Location: PACU  Anesthesia Type:General  Level of Consciousness: awake and patient cooperative  Airway & Oxygen Therapy: Patient Spontanous Breathing and Patient connected to face mask oxygen  Post-op Assessment: Report given to RN and Post -op Vital signs reviewed and stable  Post vital signs: Reviewed and stable  Last Vitals:  Vitals Value Taken Time  BP 118/67 04/14/2018  1:41 AM  Temp    Pulse 91 04/14/2018  1:43 AM  Resp 29 04/14/2018  1:43 AM  SpO2 99 % 04/14/2018  1:43 AM  Vitals shown include unvalidated device data.  Last Pain:  Vitals:   04/13/18 2340  TempSrc: Oral  PainSc:          Complications: No apparent anesthesia complications

## 2018-04-14 NOTE — Progress Notes (Signed)
Pharmacy Antibiotic Note  Andrea Norton is a 72 y.o. female admitted on 04/13/2018 admitted for complaints of fever and pain with urination.   Pharmacy has been consulted for ciprofloxacin  dosing.   Patient is s/p cystoscopy with right double-J stent placement due to  large right renal pelvis/right UPJ stone with mild right hydronephrosis.  Plan: Start Cipro 200mg  IV q12h Pharmacy will continue to monitor labs, cultures and patient progress.  Height: 5' 5.5" (166.4 cm) Weight: 250 lb (113.4 kg) IBW/kg (Calculated) : 58.15  Temp (24hrs), Avg:98.7 F (37.1 C), Min:98.2 F (36.8 C), Max:98.9 F (37.2 C)  Recent Labs  Lab 04/13/18 2050 04/13/18 2115 04/14/18 0305 04/14/18 0408  WBC 9.6  --   --  14.4*  CREATININE 0.87  --  0.80  --   LATICACIDVEN  --  1.62  --   --     Estimated Creatinine Clearance: 82.9 mL/min (by C-G formula based on SCr of 0.8 mg/dL).    Allergies  Allergen Reactions  . Morphine And Related Other (See Comments)    Hallunications, confusion for 1 month    Antimicrobials this admission: Metronidazole 9/20 >>9/20 Ciprofloxacin 9/20 >>    Microbiology results: 9/20 BCx2: NG <12h   Thank you for allowing pharmacy to be a part of this patient's care.  Tama Highamara Aidan Moten 04/14/2018 8:20 AM

## 2018-04-14 NOTE — ED Notes (Signed)
Patient transported to the OR 

## 2018-04-14 NOTE — Anesthesia Procedure Notes (Signed)
Procedure Name: LMA Insertion Date/Time: 04/14/2018 1:18 AM Performed by: Shona NeedlesWynn, Kerryann Allaire M, MD Pre-anesthesia Checklist: Patient identified, Emergency Drugs available, Suction available, Patient being monitored and Timeout performed Patient Re-evaluated:Patient Re-evaluated prior to induction Oxygen Delivery Method: Circle system utilized and Simple face mask Preoxygenation: Pre-oxygenation with 100% oxygen Induction Type: IV induction Ventilation: Mask ventilation without difficulty LMA: LMA inserted LMA Size: 4.0 Number of attempts: 1

## 2018-04-14 NOTE — ED Notes (Signed)
Date and time results received: 04/14/18 00:32 (use smartphrase ".now" to insert current time)  Test: I-stat troponin Critical Value: 0.11  Name of Provider Notified: Dalhstedt  Orders Received? Or Actions Taken?: Physician OR notified

## 2018-04-14 NOTE — OR Nursing (Signed)
Dr Sharl MaLama in to evaluate patient to admit her, per Dr Dahlstedt's request.

## 2018-04-14 NOTE — Progress Notes (Signed)
CRITICAL VALUE ALERT  Critical Value:  Troponin 0.16 & Gram (-) Rods in blood cultures.   Date & Time Notied:  04/14/18 @ 1044.   Provider Notified: Ardyth HarpsHernandez, MD.  Orders Received/Actions taken: Pt asymptomatic, continue to monitor. Added on a urine culture to find any growth in the urine.

## 2018-04-14 NOTE — Consult Note (Signed)
Urology Consult   Physician requesting consult: Kennis Carina, MD  Reason for consult: Right renal calculus with obstruction/infection  History of Present Illness: Andrea Norton is a 72 y.o. female without prior history of urolithiasis who presented to the emergency room earlier this evening with fevers for over 1 day, right flank pain and confusion.  She was found to have infected appearing urine, and with flank pain underwent CT stone protocol which revealed large right renal stone with hydronephrosis.  There is evidence of urinary tract infection, and urologic consultation was requested.  The patient does have mildly elevated troponin levels.  She does have a history of COPD and diabetes.  She has slight elevation of her lactate level as well.  I have recommended emergent cystoscopy and stent placement with adequate management of her urinary tract infection with an in hospital stay.   Past Medical History:  Diagnosis Date  . Abnormal stress test 01-22-2009   LOW RISK ADENOSINE NUCLEAR STUDY W/ PROBABLE MILD APICAL THINNING BUT NO ISCHEMIA  . Anxiety   . Arthritis    HANDS, KNEES  . B12 deficiency anemia   . Bronchitis    hx of   . Chronic back pain   . Chronic bronchitis (HCC)   . Chronic neck pain   . Coronary artery disease 2005   STRESS TEST /, NOte Dr Kirtland Bouchard EPIC/ no cardiologist now--STATES HEART STENT PLACED 2010  . DDD (degenerative disc disease), cervical   . DDD (degenerative disc disease), lumbar   . Depression    states well controlled  . Diabetes mellitus without complication (HCC)    pt states since having gastric banding does not have take medication was on metformin in past  . Diverticulitis of colon   . GERD (gastroesophageal reflux disease)   . H/O hiatal hernia   . Hemorrhoid   . Hemorrhoids   . History of urinary tract infection   . Hyperlipidemia   . Hypertension      PCP Dr Lysbeth Galas; pt currently on no medication for treatment   . Hypothyroidism   .  Inguinal hernia, right    with ventral hernia  . Pneumonia    hx of walking pneumonia  . Radicular pain in left arm OCCASIONAL PAIN SHOOTING DOWN LEFT ARM SECAONDARY TO  CERVICAL DEGENERATION  . Sleep apnea    couldnt tolerate machine- states last sleep study " many years ago"  . Status post primary angioplasty with coronary stent    DRUG-ELUTING STENT X1  TO PROXIMAL LAD  . Synovitis of knee HYPERTROPHIC RIGHT KNEE  . Urinary incontinence   . Vitamin D deficiency     Past Surgical History:  Procedure Laterality Date  . APPENDECTOMY  1993  . CATARACT EXTRACTION W/PHACO Right 05/06/2014   Procedure: CATARACT EXTRACTION PHACO AND INTRAOCULAR LENS PLACEMENT (IOC);  Surgeon: Loraine Leriche T. Nile Riggs, MD;  Location: [facility];  Service: Ophthalmology;  Laterality: Right;  CDE 6.41  . CATARACT EXTRACTION W/PHACO Left 05/20/2014   Procedure: CATARACT EXTRACTION PHACO AND INTRAOCULAR LENS PLACEMENT (IOC);  Surgeon: Loraine Leriche T. Nile Riggs, MD;  Location: [facility];  Service: Ophthalmology;  Laterality: Left;  CDE:4.42  . COLON SURGERY    . CORONARY ANGIOPLASTY  2004   DRUG-ELUTING STENT X1 TO PROXIMAL  LAD  . EXCISIONAL TOTAL KNEE ARTHROPLASTY WITH ANTIBIOTIC SPACERS Right 09/14/2015   Procedure: RIGHT TOTAL KNEE RESSECTION ARTHROPLASTY WITH ANTIBIOTIC SPACERS;  Surgeon: Ollen Gross, MD;  Location: [facility];  Service: Orthopedics;  Laterality: Right;  .  EYE SURGERY    . HEMICOLECTOMY FOR DIVERTICULITIS  2005  . HYSTEROSCOPY W/D&C  2002  . I&D KNEE WITH POLY EXCHANGE  02/01/2012   Procedure: IRRIGATION AND DEBRIDEMENT KNEE WITH POLY EXCHANGE;  Surgeon: Loanne Drilling, MD;  Location: [facility];  Service: Orthopedics;  Laterality: Right;  Right Knee Polyethlene Revision   . IRRIGATION AND DEBRIDEMENT KNEE Right 03/10/2015   Procedure: IRRIGATION AND DEBRIDEMENT of right KNEE;  Surgeon: Ollen Gross, MD;  Location: [facility];  Service: Orthopedics;  Laterality: Right;  . JOINT REPLACEMENT     right knee   . KNEE  ARTHROSCOPY  LEFT 1999   RIGHT 2004  . KNEE ARTHROSCOPY  08/03/2011   Procedure: ARTHROSCOPY KNEE;  Surgeon: Loanne Drilling;  Location: [facility];  Service: Orthopedics;  Laterality: Right;  . KNEE ARTHROSCOPY Right 12/17/2014   Procedure: RIGHT ARTHROSCOPY KNEE, SYNOVECTOMY;  Surgeon: Ollen Gross, MD;  Location: [facility];  Service: Orthopedics;  Laterality: Right;  . KNEE ARTHROSCOPY W/ DEBRIDEMENT  01-22-2010   SEPTIC KNEE  . KNEE CLOSED REDUCTION  05-07-2009  RIGHT KNEE   LEFT KNEE  10-01-2009  . LAPAROSCOPIC GASTRIC SLEEVE RESECTION N/A 07/21/2014   Procedure: LAPAROSCOPIC GASTRIC SLEEVE RESECTION, TAKEDOWN OF INCARCERATED VENTRAL HERNIA, ENTERAL LYSIS, UPPER ENDOSCOPY;  Surgeon: Valarie Merino, MD;  Location: [facility];  Service: General;  Laterality: N/A;  . LEFT FOOT SURG.  1990  . OPEN PATELLOFEMORAL RIGHT KNEE/ LATERAL RELEASE  05-04-2009  . QUADRICEPS TENDON REPAIR Right 06/02/2015   Procedure: RIGHT KNEE QUADRICEP TENDON REPAIR ;  Surgeon: Ollen Gross, MD;  Location: [facility];  Service: Orthopedics;  Laterality: Right;  . RIGHT KNEE I & D POLYTHYLENE REVISION  02-04-2010   KNEE INFECTED  . SYNOVECTOMY  08/03/2011   Procedure: SYNOVECTOMY;  Surgeon: Loanne Drilling;  Location: [facility];  Service: Orthopedics;  Laterality: Right;  . TOTAL KNEE ARTHROPLASTY  LEFT  2005   RIGHT  03-13-2009  . TOTAL KNEE ARTHROPLASTY Right 11/23/2015   Procedure: RIGHT TOTAL KNEE ARTHROPLASTY REIMPLANTATION;  Surgeon: Ollen Gross, MD;  Location: [facility];  Service: Orthopedics;  Laterality: Right;  . TOTAL KNEE REVISION  RIGHT 09-30-2009   LEFT 11-25-2009  . TOTAL KNEE REVISION Right 02/20/2013   Procedure: RIGHT TOTAL KNEE ARTHROPLASTY REVISION VERSUS RESECTION ARTHROPLASTY;  Surgeon: Loanne Drilling, MD;  Location: [facility];  Service: Orthopedics;  Laterality: Right;  . TOTAL KNEE REVISION Right 04/02/2014   Procedure: RIGHT KNEE FEMORAL ARTHROPLASTY REVISION;  Surgeon:  Loanne Drilling, MD;  Location: [facility];  Service: Orthopedics;  Laterality: Right;     Current Hospital Medications: Scheduled Meds: Continuous Infusions: . metronidazole Stopped (04/13/18 2352)   PRN Meds:.  Allergies:  Allergies  Allergen Reactions  . Morphine And Related Other (See Comments)    Hallunications, confusion for 1 month    Family History  Problem Relation Age of Onset  . COPD Mother   . Cancer Father        lung    Social History:  reports that she quit smoking about 15 years ago. Her smoking use included cigarettes. She has a 30.00 pack-year smoking history. She has never used smokeless tobacco. She reports that she does not drink alcohol or use drugs.  ROS: A complete review of systems was performed.  All systems are negative except for pertinent findings as noted.  Physical Exam:  Vital signs in last 24 hours: Temp:  [98.9 F (37.2 C)] 98.9  F (37.2 C) (09/20 2340) Pulse Rate:  [104] 104 (09/20 2340) Resp:  [22-25] 25 (09/20 2340) BP: (113)/(55-66) 113/55 (09/20 2340) SpO2:  [98 %] 98 % (09/20 2340) Weight:  [113.4 kg] 113.4 kg (09/20 2045) General:  Alert and oriented, No acute distress HEENT: Normocephalic, atraumatic Neck: No JVD or lymphadenopathy Cardiovascular: Regular rate and rhythm Lungs: Clear bilaterally Abdomen: Soft, obese, mild right lower quadrant and right CVA tenderness.  Multiple surgical scars anteriorly. Extremities: No edema Neurologic: Grossly intact  Laboratory Data:  Recent Labs    04/13/18 2050  WBC 9.6  HGB 12.6  HCT 38.5  PLT 180    Recent Labs    04/13/18 2050  NA 138  K 3.1*  CL 107  GLUCOSE 133*  BUN 13  CALCIUM 8.4*  CREATININE 0.87     Results for orders placed or performed during the hospital encounter of 04/13/18 (from the past 24 hour(s))  Comprehensive metabolic panel     Status: Abnormal   Collection Time: 04/13/18  8:50 PM  Result Value Ref Range   Sodium 138 135 - 145 mmol/L    Potassium 3.1 (L) 3.5 - 5.1 mmol/L   Chloride 107 98 - 111 mmol/L   CO2 22 22 - 32 mmol/L   Glucose, Bld 133 (H) 70 - 99 mg/dL   BUN 13 8 - 23 mg/dL   Creatinine, Ser 4.09 0.44 - 1.00 mg/dL   Calcium 8.4 (L) 8.9 - 10.3 mg/dL   Total Protein 7.0 6.5 - 8.1 g/dL   Albumin 3.3 (L) 3.5 - 5.0 g/dL   AST 23 15 - 41 U/L   ALT 15 0 - 44 U/L   Alkaline Phosphatase 97 38 - 126 U/L   Total Bilirubin 2.3 (H) 0.3 - 1.2 mg/dL   GFR calc non Af Amer >60 >60 mL/min   GFR calc Af Amer >60 >60 mL/min   Anion gap 9 5 - 15  CBC WITH DIFFERENTIAL     Status: Abnormal   Collection Time: 04/13/18  8:50 PM  Result Value Ref Range   WBC 9.6 4.0 - 10.5 K/uL   RBC 4.54 3.87 - 5.11 MIL/uL   Hemoglobin 12.6 12.0 - 15.0 g/dL   HCT 81.1 91.4 - 78.2 %   MCV 84.8 78.0 - 100.0 fL   MCH 27.8 26.0 - 34.0 pg   MCHC 32.7 30.0 - 36.0 g/dL   RDW 95.6 21.3 - 08.6 %   Platelets 180 150 - 400 K/uL   Neutrophils Relative % 89 %   Neutro Abs 8.5 (H) 1.7 - 7.7 K/uL   Lymphocytes Relative 5 %   Lymphs Abs 0.5 (L) 0.7 - 4.0 K/uL   Monocytes Relative 5 %   Monocytes Absolute 0.5 0.1 - 1.0 K/uL   Eosinophils Relative 1 %   Eosinophils Absolute 0.1 0.0 - 0.7 K/uL   Basophils Relative 0 %   Basophils Absolute 0.0 0.0 - 0.1 K/uL  Blood Culture (routine x 2)     Status: None (Preliminary result)   Collection Time: 04/13/18  8:50 PM  Result Value Ref Range   Specimen Description RIGHT ANTECUBITAL    Special Requests      BOTTLES DRAWN AEROBIC AND ANAEROBIC Blood Culture adequate volume Performed at [facility], [address] [place], [place] [address]    Culture PENDING    Report Status PENDING   Lipase, blood     Status: None   Collection Time: 04/13/18  8:50 PM  Result  Value Ref Range   Lipase 23 11 - 51 U/L  Troponin I     Status: Abnormal   Collection Time: 04/13/18  8:50 PM  Result Value Ref Range   Troponin I 0.16 (HH) <0.03 ng/mL  I-Stat CG4 Lactic Acid, ED     Status: None   Collection Time: 04/13/18   9:15 PM  Result Value Ref Range   Lactic Acid, Venous 1.62 0.5 - 1.9 mmol/L  Blood Culture (routine x 2)     Status: None (Preliminary result)   Collection Time: 04/13/18  9:15 PM  Result Value Ref Range   Specimen Description BLOOD LEFT ARM    Special Requests      BOTTLES DRAWN AEROBIC AND ANAEROBIC Blood Culture adequate volume Performed at [facility], [address] [place], [place] [address]    Culture PENDING    Report Status PENDING   Urinalysis, Routine w reflex microscopic     Status: Abnormal   Collection Time: 04/13/18  9:20 PM  Result Value Ref Range   Color, Urine YELLOW YELLOW   APPearance HAZY (A) CLEAR   Specific Gravity, Urine 1.010 1.005 - 1.030   pH 8.0 5.0 - 8.0   Glucose, UA NEGATIVE NEGATIVE mg/dL   Hgb urine dipstick MODERATE (A) NEGATIVE   Bilirubin Urine NEGATIVE NEGATIVE   Ketones, ur NEGATIVE NEGATIVE mg/dL   Protein, ur 30 (A) NEGATIVE mg/dL   Nitrite NEGATIVE NEGATIVE   Leukocytes, UA MODERATE (A) NEGATIVE   RBC / HPF 21-50 0 - 5 RBC/hpf   WBC, UA >50 (H) 0 - 5 WBC/hpf   Bacteria, UA RARE (A) NONE SEEN   Mucus PRESENT   Influenza panel by PCR (type A & B)     Status: None   Collection Time: 04/13/18  9:20 PM  Result Value Ref Range   Influenza A By PCR NEGATIVE NEGATIVE   Influenza B By PCR NEGATIVE NEGATIVE  I-stat troponin, ED     Status: Abnormal   Collection Time: 04/13/18 11:31 PM  Result Value Ref Range   Troponin i, poc 0.11 (HH) 0.00 - 0.08 ng/mL   Comment 3           Recent Results (from the past 240 hour(s))  Surgical pcr screen     Status: Abnormal   Collection Time: 04/05/18 10:00 AM  Result Value Ref Range Status   MRSA, PCR NEGATIVE NEGATIVE Final   Staphylococcus aureus POSITIVE (A) NEGATIVE Final    Comment: (NOTE) The Xpert SA Assay (FDA approved for NASAL specimens in patients 70 years of age and older), is one component of a comprehensive surveillance program. It is not intended to diagnose infection nor to guide or  monitor treatment. Performed at [facility], [address] [place], [place] [address]   Blood Culture (routine x 2)     Status: None (Preliminary result)   Collection Time: 04/13/18  8:50 PM  Result Value Ref Range Status   Specimen Description RIGHT ANTECUBITAL  Final   Special Requests   Final    BOTTLES DRAWN AEROBIC AND ANAEROBIC Blood Culture adequate volume Performed at [facility], [address] [place], [place] [address]    Culture PENDING  Incomplete   Report Status PENDING  Incomplete  Blood Culture (routine x 2)     Status: None (Preliminary result)   Collection Time: 04/13/18  9:15 PM  Result Value Ref Range Status   Specimen Description BLOOD LEFT ARM  Final  Special Requests   Final    BOTTLES DRAWN AEROBIC AND ANAEROBIC Blood Culture adequate volume Performed at [facility], [address], [place], [place] [address]    Culture PENDING  Incomplete   Report Status PENDING  Incomplete    Renal Function: Recent Labs    04/13/18 2050  CREATININE 0.87   Estimated Creatinine Clearance: 76.3 mL/min (by C-G formula based on SCr of 0.87 mg/dL).  Radiologic Imaging: Dg Chest 1 View  Result Date: 04/13/2018 CLINICAL DATA:  Lethargy, flu like symptoms EXAM: CHEST  1 VIEW COMPARISON:  05/03/2016 FINDINGS: Mild cardiomegaly. Increased interstitial and hazy opacity at the bases. Aortic atherosclerosis. No pneumothorax. IMPRESSION: 1. Increased interstitial and hazy bibasilar opacity, could reflect edema or interstitial inflammatory process/viral process. 2. Cardiomegaly Electronically Signed   By: Jasmine Pang M.D.   On: 04/13/2018 22:11   Ct Abdomen Pelvis W Contrast  Result Date: 04/13/2018 CLINICAL DATA:  72 year old female with abdominal pain. EXAM: CT ABDOMEN AND PELVIS WITH CONTRAST TECHNIQUE: Multidetector CT imaging of the abdomen and pelvis was performed using the standard protocol following bolus administration of intravenous contrast. CONTRAST:   ISOVUE-300 IOPAMIDOL (ISOVUE-300) INJECTION 61% COMPARISON:  Abdominal CT dated 05/15/2017 FINDINGS: Lower chest: The visualized lung bases are clear. No intra-abdominal free air or free fluid. Hepatobiliary: Punctate focus of calcification in the right lobe of the liver, likely an old granuloma. The liver is otherwise unremarkable. No intrahepatic biliary ductal dilatation. The gallbladder is unremarkable. Pancreas: Unremarkable. No pancreatic ductal dilatation or surrounding inflammatory changes. Spleen: Normal in size without focal abnormality. Adrenals/Urinary Tract: The adrenal glands are unremarkable. There is a stone measuring 13 x 20 mm in the right renal pelvis with associated obstruction of the right UPJ and mild right hydronephrosis. A 3 mm nonobstructing right renal inferior pole calculus is also noted. There is right perinephric stranding. Correlation with urinalysis recommended to evaluate for UTI. The left kidney is unremarkable. The visualized ureters and urinary bladder appear unremarkable. Stomach/Bowel: Postsurgical changes of gastric sleeve. There is no bowel obstruction or active inflammation. The appendix is not visualized, likely surgically absent. Vascular/Lymphatic: Moderate aortoiliac atherosclerotic disease. No portal venous gas. There is no adenopathy. Reproductive: A 2 cm calcified anterior body fibroid. No adnexal masses. Other: Midline vertical anterior abdominal wall incisional scar and postsurgical changes of ventral hernia repair with mesh. Musculoskeletal: Extensive degenerative changes of the spine with multilevel disc desiccation and vacuum phenomena. Grade 1 L3-L4 anterolisthesis. No acute osseous pathology. IMPRESSION: 1. Large right renal pelvis/right UPJ stone with mild right hydronephrosis. Correlation with urinalysis recommended to exclude superimposed UTI. 2.  Aortic Atherosclerosis (ICD10-I70.0). Electronically Signed   By: Elgie Collard M.D.   On: 04/13/2018 23:30     I independently reviewed the above imaging studies.  Impression/Assessment:  20 mm right renal pelvic stone with hydronephrosis and urinary tract infection  Plan:  I spoken with the patient and her husband who attended her today.  I have recommended urgent stenting cystoscopically.  I discussed the procedure with them as well as eventual stone management to be decided on at a later time, after her infection and current medical condition has improved.  They understand the process and desire to proceed.

## 2018-04-14 NOTE — Progress Notes (Signed)
Patient seen and examined, database reviewed.  No family members at bedside.  Patient was brought into the hospital early this morning due to acute encephalopathy.  Per family she had been very lethargic and febrile up to 104.4 at home.  We have not been able to document a fever since arrival.  She was found on CT scan to have a large right renal pelvis/right UPJ stone with right-sided hydronephrosis.  She was also found to have a dirty UA consistent with UTI, she met sepsis criteria.  Urology was consulted emergently overnight who took patient to the OR for a double-J stent placement.  Subsequent blood cultures are growing gram-negative rods.  Continue Cipro pending final culture data.  We will continue to follow.  Domingo Mend, MD [facility] Pager: [phone]

## 2018-04-14 NOTE — Anesthesia Postprocedure Evaluation (Signed)
Anesthesia Post Note  Patient: Andrea Norton  Procedure(s) Performed: CYSTOSCOPY WITH STENT PLACEMENT (Right )  Patient location during evaluation: PACU Anesthesia Type: General Level of consciousness: awake and alert Pain management: pain level controlled Vital Signs Assessment: post-procedure vital signs reviewed and stable Respiratory status: spontaneous breathing, nonlabored ventilation, respiratory function stable and patient connected to nasal cannula oxygen Cardiovascular status: blood pressure returned to baseline and stable Postop Assessment: no apparent nausea or vomiting Anesthetic complications: no     Last Vitals:  Vitals:   04/13/18 2100 04/13/18 2340  BP: 113/66 (!) 113/55  Pulse:  (!) 104  Resp: (!) 22 (!) 25  Temp:  37.2 C  SpO2:  98%    Last Pain:  Vitals:   04/13/18 2340  TempSrc: Oral  PainSc:                  Andrea Norton

## 2018-04-14 NOTE — H&P (Signed)
TRH H&P    Patient Demographics:    Andrea Norton, is a 72 y.o. female  MRN: [id number]  DOB - [birth year]  Admit Date - 04/13/2018  Referring MD/NP/PA: Dr Sedonia Small  Outpatient Primary MD for the patient is Dione Housekeeper, MD  Patient coming from: *Home  Chief complaint- *fever, abdominal pain   HPI:    Andrea Norton  is a 72 y.o. female, with history of COPD, hypertension, CAD, GERD came to hospital with fever.  She denies chest pain or shortness of breath.  Complains of pain during urination.  Also had abdominal pain right upper quadrant.  In the ED CT scan of the abdomen pelvis showed large right renal pelvis/right UPJ stone with mild right hydronephrosis.  Patient started on IV ciprofloxacin and Flagyl.  Urology was consulted and patient underwent cystoscopy with right double-J stent placement. Patient denies nausea vomiting or diarrhea. Complains of  back pain.  She also has elevated troponin 0.16 in the ED    Review of systems:    In addition to the HPI above,    All other systems reviewed and are negative.   With Past History of the following :    Past Medical History:  Diagnosis Date  . Abnormal stress test 01-22-2009   LOW RISK ADENOSINE NUCLEAR STUDY W/ PROBABLE MILD APICAL THINNING BUT NO ISCHEMIA  . Anxiety   . Arthritis    HANDS, KNEES  . B12 deficiency anemia   . Bronchitis    hx of   . Chronic back pain   . Chronic bronchitis ([place])   . Chronic neck pain   . Coronary artery disease 2005   STRESS TEST /, NOte Dr Jenkins [place] EPIC/ no cardiologist now--STATES [place] 2010  . DDD (degenerative disc disease), cervical   . DDD (degenerative disc disease), lumbar   . Depression    states well controlled  . Diabetes mellitus without complication ([place])    pt states since having gastric banding does not have take medication was on metformin in past  . Diverticulitis of colon   .  GERD (gastroesophageal reflux disease)   . H/O hiatal hernia   . Hemorrhoid   . Hemorrhoids   . History of urinary tract infection   . Hyperlipidemia   . Hypertension      PCP Dr Edrick Oh; pt currently on no medication for treatment   . Hypothyroidism   . Inguinal hernia, right    with ventral hernia  . Pneumonia    hx of walking pneumonia  . Radicular pain in left arm OCCASIONAL PAIN SHOOTING DOWN LEFT ARM SECAONDARY TO  CERVICAL DEGENERATION  . Sleep apnea    couldnt tolerate machine- states last sleep study " many years ago"  . Status post primary angioplasty with coronary stent    DRUG-ELUTING STENT X1  TO PROXIMAL LAD  . Synovitis of knee HYPERTROPHIC RIGHT KNEE  . Urinary incontinence   . Vitamin D deficiency       Past Surgical History:  Procedure Laterality Date  . APPENDECTOMY  1993  . CATARACT EXTRACTION W/PHACO Right 05/06/2014   Procedure: CATARACT EXTRACTION PHACO AND INTRAOCULAR LENS PLACEMENT (IOC);  Surgeon: Elta Guadeloupe T. Gershon Crane, MD;  Location: [facility];  Service: Ophthalmology;  Laterality: Right;  CDE 6.41  . CATARACT EXTRACTION W/PHACO Left 05/20/2014   Procedure: CATARACT EXTRACTION PHACO AND INTRAOCULAR LENS PLACEMENT (IOC);  Surgeon: Elta Guadeloupe T. Gershon Crane, MD;  Location: [facility];  Service: Ophthalmology;  Laterality: Left;  CDE:4.42  . COLON SURGERY    . CORONARY ANGIOPLASTY  2004   DRUG-ELUTING STENT X1 TO PROXIMAL  LAD  . EXCISIONAL TOTAL KNEE ARTHROPLASTY WITH ANTIBIOTIC SPACERS Right 09/14/2015   Procedure: RIGHT TOTAL KNEE RESSECTION ARTHROPLASTY WITH ANTIBIOTIC SPACERS;  Surgeon: Gaynelle Arabian, MD;  Location: [facility];  Service: Orthopedics;  Laterality: Right;  . EYE SURGERY    . HEMICOLECTOMY FOR DIVERTICULITIS  2005  . HYSTEROSCOPY W/D&C  2002  . I&D KNEE WITH POLY EXCHANGE  02/01/2012   Procedure: IRRIGATION AND DEBRIDEMENT KNEE WITH POLY EXCHANGE;  Surgeon: Gearlean Alf, MD;  Location: [facility];  Service: Orthopedics;  Laterality: Right;  Right Knee Polyethlene  Revision   . IRRIGATION AND DEBRIDEMENT KNEE Right 03/10/2015   Procedure: IRRIGATION AND DEBRIDEMENT of right KNEE;  Surgeon: Gaynelle Arabian, MD;  Location: [facility];  Service: Orthopedics;  Laterality: Right;  . JOINT REPLACEMENT     right knee   . KNEE ARTHROSCOPY  LEFT 1999   RIGHT 2004  . KNEE ARTHROSCOPY  08/03/2011   Procedure: ARTHROSCOPY KNEE;  Surgeon: Gearlean Alf;  Location: [facility];  Service: Orthopedics;  Laterality: Right;  . KNEE ARTHROSCOPY Right 12/17/2014   Procedure: RIGHT ARTHROSCOPY KNEE, SYNOVECTOMY;  Surgeon: Gaynelle Arabian, MD;  Location: [facility];  Service: Orthopedics;  Laterality: Right;  . KNEE ARTHROSCOPY W/ DEBRIDEMENT  01-22-2010   SEPTIC KNEE  . KNEE CLOSED REDUCTION  05-07-2009  RIGHT KNEE   LEFT KNEE  10-01-2009  . LAPAROSCOPIC GASTRIC SLEEVE RESECTION N/A 07/21/2014   Procedure: LAPAROSCOPIC GASTRIC SLEEVE RESECTION, TAKEDOWN OF INCARCERATED VENTRAL HERNIA, ENTERAL LYSIS, UPPER ENDOSCOPY;  Surgeon: Pedro Earls, MD;  Location: [facility];  Service: General;  Laterality: N/A;  . LEFT FOOT SURG.  1990  . OPEN PATELLOFEMORAL RIGHT KNEE/ LATERAL RELEASE  05-04-2009  . QUADRICEPS TENDON REPAIR Right 06/02/2015   Procedure: RIGHT KNEE QUADRICEP TENDON REPAIR ;  Surgeon: Gaynelle Arabian, MD;  [facility];  Service: Orthopedics;  Laterality: Right;  . RIGHT KNEE I & D POLYTHYLENE REVISION  02-04-2010   KNEE INFECTED  . SYNOVECTOMY  08/03/2011   Procedure: SYNOVECTOMY;  Surgeon: Gearlean Alf;  Location: [facility];  Service: Orthopedics;  Laterality: Right;  . TOTAL KNEE ARTHROPLASTY  LEFT  2005   RIGHT  03-13-2009  . TOTAL KNEE ARTHROPLASTY Right 11/23/2015   Procedure: RIGHT TOTAL KNEE ARTHROPLASTY REIMPLANTATION;  Surgeon: Gaynelle Arabian, MD;  [facility];  Service: Orthopedics;  Laterality: Right;  . TOTAL KNEE REVISION  RIGHT 09-30-2009   LEFT 11-25-2009  . TOTAL KNEE REVISION Right 02/20/2013   Procedure: RIGHT TOTAL  KNEE ARTHROPLASTY REVISION VERSUS RESECTION ARTHROPLASTY;  Surgeon: Gearlean Alf, MD;  [facility];  Service: Orthopedics;  Laterality: Right;  . TOTAL KNEE REVISION Right 04/02/2014   Procedure: RIGHT KNEE FEMORAL ARTHROPLASTY REVISION;  Surgeon: Gearlean Alf, MD;  [facility];  Service: Orthopedics;  Laterality: Right;      Social History:      Social History   Tobacco Use  .  Smoking status: Former Smoker    Packs/day: 1.00    Years: 30.00    Pack years: 30.00    Types: Cigarettes    Last attempt to quit: 07/31/2002    Years since quitting: 15.7  . Smokeless tobacco: Never Used  Substance Use Topics  . Alcohol use: No       Family History :     Family History  Problem Relation Age of Onset  . COPD Mother   . Cancer Father        lung      Home Medications:   Prior to Admission medications   Medication Sig Start Date End Date Taking? Authorizing Provider  acetaminophen (TYLENOL) 325 MG tablet Take 650 mg by mouth every 6 (six) hours as needed (for headaches.).   Yes [provider]  aspirin EC 81 MG tablet Take 81 mg by mouth at bedtime.    Yes [provider]  Cholecalciferol (VITAMIN D3) 2000 units TABS Take 2,000 Units by mouth daily.   Yes [provider]  cyanocobalamin (,VITAMIN B-12,) 1000 MCG/ML injection Inject 1,000 mcg into the muscle every 30 (thirty) days.  11/16/16  Yes [provider]  escitalopram (LEXAPRO) 10 MG tablet Take 10 mg by mouth daily with lunch.    Yes [provider]  gabapentin (NEURONTIN) 100 MG capsule Take 300 mg by mouth 2 (two) times daily. Morning & night   Yes [provider]  levothyroxine (SYNTHROID, LEVOTHROID) 125 MCG tablet Take 125 mcg by mouth daily after lunch. Brand Name Only Takes at lunchtime   Yes [provider]  meloxicam (MOBIC) 7.5 MG tablet Take 7.5 mg by mouth 2 (two) times daily. Morning & night   Yes [provider]   methocarbamol (ROBAXIN) 500 MG tablet Take 1 tablet (500 mg total) by mouth every 6 (six) hours as needed for muscle spasms. Patient taking differently: Take 500 mg by mouth 3 (three) times daily as needed for muscle spasms.  11/25/15  Yes Perkins, Alexzandrew L, PA-C  NONFORMULARY OR COMPOUNDED ITEM Apply 1 application topically 2 (two) times daily as needed (for skin irritation/itching.). COMPOUNDED (TRI-MIX) TCA0.1/KETOCONAZOLE 2%/SSD 1%(30:30:45)   Yes [provider]  pantoprazole (PROTONIX) 40 MG tablet Take 40 mg by mouth daily with lunch.    Yes [provider]  simvastatin (ZOCOR) 40 MG tablet Take 40 mg by mouth daily with lunch.    Yes [provider]  Vitamin D, Ergocalciferol, (DRISDOL) 50000 units CAPS capsule Take 50,000 Units by mouth every Friday.    Yes [provider]  naproxen sodium (ALEVE) 220 MG tablet Take 440 mg by mouth 2 (two) times daily as needed (headaches).     [provider]     Allergies:     Allergies  Allergen Reactions  . Morphine And Related Other (See Comments)    Hallunications, confusion for 1 month     Physical Exam:   Vitals  Blood pressure (!) 123/54, pulse 95, temperature 98.2 F (36.8 C), resp. rate (!) 31, height 5' 5.5" (1.664 m), weight 113.4 kg, SpO2 95 %.  1.  General: Appears in no acute distress  2. Psychiatric:  Intact judgement and  insight, awake alert, oriented x 3.  3. Neurologic: No focal neurological deficits, all cranial nerves intact.Strength 5/5 all 4 extremities, sensation intact all 4 extremities, plantars down going.  4. Eyes :  anicteric sclerae, moist conjunctivae with no lid lag. PERRLA.  5. ENMT:  Oropharynx  clear with moist mucous membranes and good dentition  6. Neck:  supple, no cervical lymphadenopathy appriciated, No thyromegaly  7. Respiratory : Normal respiratory effort, good air movement bilaterally,clear to  auscultation bilaterally  8.  Cardiovascular : RRR, no gallops, rubs or murmurs, no leg edema  9. Gastrointestinal:  Positive bowel sounds, abdomen soft,tender to palpation in the right flank region,no hepatosplenomegaly, no rigidity or guarding       10. Skin:  No cyanosis, normal texture and turgor, no rash, lesions or ulcers  11.Musculoskeletal:  Good muscle tone,  joints appear normal , no effusions,  normal range of motion    Data Review:    CBC Recent Labs  Lab 04/13/18 2050  WBC 9.6  HGB 12.6  HCT 38.5  PLT 180  MCV 84.8  MCH 27.8  MCHC 32.7  RDW 14.1  LYMPHSABS 0.5*  MONOABS 0.5  EOSABS 0.1  BASOSABS 0.0   ------------------------------------------------------------------------------------------------------------------  Results for orders placed or performed during the hospital encounter of 04/13/18 (from the past 48 hour(s))  Comprehensive metabolic panel     Status: Abnormal   Collection Time: 04/13/18  8:50 PM  Result Value Ref Range   Sodium 138 135 - 145 mmol/L   Potassium 3.1 (L) 3.5 - 5.1 mmol/L   Chloride 107 98 - 111 mmol/L   CO2 22 22 - 32 mmol/L   Glucose, Bld 133 (H) 70 - 99 mg/dL   BUN 13 8 - 23 mg/dL   Creatinine, Ser 0.87 0.44 - 1.00 mg/dL   Calcium 8.4 (L) 8.9 - 10.3 mg/dL   Total Protein 7.0 6.5 - 8.1 g/dL   Albumin 3.3 (L) 3.5 - 5.0 g/dL   AST 23 15 - 41 U/L   ALT 15 0 - 44 U/L   Alkaline Phosphatase 97 38 - 126 U/L   Total Bilirubin 2.3 (H) 0.3 - 1.2 mg/dL   GFR calc non Af Amer >60 >60 mL/min   GFR calc Af Amer >60 >60 mL/min    Comment: (NOTE) The eGFR has been calculated using the CKD EPI equation. This calculation has not been validated in all clinical situations. eGFR's persistently <60 mL/min signify possible Chronic Kidney Disease.    Anion gap 9 5 - 15    Comment: Performed at [facility], [address] [place], [place] [address]  CBC WITH DIFFERENTIAL     Status: Abnormal   Collection Time: 04/13/18  8:50 PM  Result Value Ref Range   WBC 9.6  4.0 - 10.5 K/uL   RBC 4.54 3.87 - 5.11 MIL/uL   Hemoglobin 12.6 12.0 - 15.0 g/dL   HCT 38.5 36.0 - 46.0 %   MCV 84.8 78.0 - 100.0 fL   MCH 27.8 26.0 - 34.0 pg   MCHC 32.7 30.0 - 36.0 g/dL   RDW 14.1 11.5 - 15.5 %   Platelets 180 150 - 400 K/uL   Neutrophils Relative % 89 %   Neutro Abs 8.5 (H) 1.7 - 7.7 K/uL   Lymphocytes Relative 5 %   Lymphs Abs 0.5 (L) 0.7 - 4.0 K/uL   Monocytes Relative 5 %   Monocytes Absolute 0.5 0.1 - 1.0 K/uL   Eosinophils Relative 1 %   Eosinophils Absolute 0.1 0.0 - 0.7 K/uL   Basophils Relative 0 %   Basophils Absolute 0.0 0.0 - 0.1 K/uL    Comment: Performed at [facility], [address] [place], [place] [address]  Blood Culture (routine x 2)  Status: None (Preliminary result)   Collection Time: 04/13/18  8:50 PM  Result Value Ref Range   Specimen Description RIGHT ANTECUBITAL    Special Requests      BOTTLES DRAWN AEROBIC AND ANAEROBIC Blood Culture adequate volume Performed at [facility], [address], [place], [place] [address]    Culture PENDING    Report Status PENDING   Lipase, blood     Status: None   Collection Time: 04/13/18  8:50 PM  Result Value Ref Range   Lipase 23 11 - 51 U/L    Comment: Performed at [facility], [address], [place], [place] [address]  Troponin I     Status: Abnormal   Collection Time: 04/13/18  8:50 PM  Result Value Ref Range   Troponin I 0.16 (HH) <0.03 ng/mL    Comment: CRITICAL RESULT CALLED TO, READ BACK BY AND VERIFIED WITH: CRABTREE,B. AT 2232 ON 04/13/2018 BY EVA Performed at [facility], [address], [place], [place] [address]   I-Stat CG4 Lactic Acid, ED     Status: None   Collection Time: 04/13/18  9:15 PM  Result Value Ref Range   Lactic Acid, Venous 1.62 0.5 - 1.9 mmol/L  Blood Culture (routine x 2)     Status: None (Preliminary result)   Collection Time: 04/13/18  9:15 PM  Result Value Ref Range   Specimen Description BLOOD LEFT ARM    Special Requests      BOTTLES DRAWN  AEROBIC AND ANAEROBIC Blood Culture adequate volume Performed at [facility], [address], [place], [place] [address]    Culture PENDING    Report Status PENDING   Urinalysis, Routine w reflex microscopic     Status: Abnormal   Collection Time: 04/13/18  9:20 PM  Result Value Ref Range   Color, Urine YELLOW YELLOW   APPearance HAZY (A) CLEAR   Specific Gravity, Urine 1.010 1.005 - 1.030   pH 8.0 5.0 - 8.0   Glucose, UA NEGATIVE NEGATIVE mg/dL   Hgb urine dipstick MODERATE (A) NEGATIVE   Bilirubin Urine NEGATIVE NEGATIVE   Ketones, ur NEGATIVE NEGATIVE mg/dL   Protein, ur 30 (A) NEGATIVE mg/dL   Nitrite NEGATIVE NEGATIVE   Leukocytes, UA MODERATE (A) NEGATIVE   RBC / HPF 21-50 0 - 5 RBC/hpf   WBC, UA >50 (H) 0 - 5 WBC/hpf   Bacteria, UA RARE (A) NONE SEEN   Mucus PRESENT     Comment: Performed at [facility], [address] [place], [place] [address]  Influenza panel by PCR (type A & B)     Status: None   Collection Time: 04/13/18  9:20 PM  Result Value Ref Range   Influenza A By PCR NEGATIVE NEGATIVE   Influenza B By PCR NEGATIVE NEGATIVE    Comment: (NOTE) The Xpert Xpress Flu assay is intended as an aid in the diagnosis of  influenza and should not be used as a sole basis for treatment.  This  assay is FDA approved for nasopharyngeal swab specimens only. Nasal  washings and aspirates are unacceptable for Xpert Xpress Flu testing. Performed at [facility], [address], [place],  [address]   I-stat troponin, ED     Status: Abnormal   Collection Time: 04/13/18 11:31 PM  Result Value Ref Range   Troponin i, poc 0.11 (HH) 0.00 - 0.08 ng/mL   Comment 3            Comment: Due to the release kinetics  of cTnI, a negative result within the first hours of the onset of symptoms does not rule out myocardial infarction with certainty. If myocardial infarction is still suspected, repeat the test at appropriate intervals.     Chemistries  Recent Labs  Lab  04/13/18 2050  NA 138  K 3.1*  CL 107  CO2 22  GLUCOSE 133*  BUN 13  CREATININE 0.87  CALCIUM 8.4*  AST 23  ALT 15  ALKPHOS 97  BILITOT 2.3*   ------------------------------------------------------------------------------------------------------------------  ------------------------------------------------------------------------------------------------------------------ GFR: Estimated Creatinine Clearance: 76.3 mL/min (by C-G formula based on SCr of 0.87 mg/dL). Liver Function Tests: Recent Labs  Lab 04/13/18 2050  AST 23  ALT 15  ALKPHOS 97  BILITOT 2.3*  PROT 7.0  ALBUMIN 3.3*   Recent Labs  Lab 04/13/18 2050  LIPASE 23   No results for input(s): AMMONIA in the last 168 hours. Coagulation Profile: No results for input(s): INR, PROTIME in the last 168 hours. Cardiac Enzymes: Recent Labs  Lab 04/13/18 2050  TROPONINI 0.16*   BNP (last 3 results) No results for input(s): PROBNP in the last 8760 hours. HbA1C: No results for input(s): HGBA1C in the last 72 hours. CBG: No results for input(s): GLUCAP in the last 168 hours. Lipid Profile: No results for input(s): CHOL, HDL, LDLCALC, TRIG, CHOLHDL, LDLDIRECT in the last 72 hours. Thyroid Function Tests: No results for input(s): TSH, T4TOTAL, FREET4, T3FREE, THYROIDAB in the last 72 hours. Anemia Panel: No results for input(s): VITAMINB12, FOLATE, FERRITIN, TIBC, IRON, RETICCTPCT in the last 72 hours.  --------------------------------------------------------------------------------------------------------------- Urine analysis:    Component Value Date/Time   COLORURINE YELLOW 04/13/2018 2120   APPEARANCEUR HAZY (A) 04/13/2018 2120   LABSPEC 1.010 04/13/2018 2120   PHURINE 8.0 04/13/2018 2120   GLUCOSEU NEGATIVE 04/13/2018 2120   HGBUR MODERATE (A) 04/13/2018 2120   BILIRUBINUR NEGATIVE 04/13/2018 2120   KETONESUR NEGATIVE 04/13/2018 2120   PROTEINUR 30 (A) 04/13/2018 2120   UROBILINOGEN 1.0 03/27/2014  1336   NITRITE NEGATIVE 04/13/2018 2120   LEUKOCYTESUR MODERATE (A) 04/13/2018 2120      Imaging Results:    Dg Chest 1 View  Result Date: 04/13/2018 CLINICAL DATA:  Lethargy, flu like symptoms EXAM: CHEST  1 VIEW COMPARISON:  05/03/2016 FINDINGS: Mild cardiomegaly. Increased interstitial and hazy opacity at the bases. Aortic atherosclerosis. No pneumothorax. IMPRESSION: 1. Increased interstitial and hazy bibasilar opacity, could reflect edema or interstitial inflammatory process/viral process. 2. Cardiomegaly Electronically Signed   By: Donavan Foil M.D.   On: 04/13/2018 22:11   Ct Abdomen Pelvis W Contrast  Result Date: 04/13/2018 CLINICAL DATA:  72 year old female with abdominal pain. EXAM: CT ABDOMEN AND PELVIS WITH CONTRAST TECHNIQUE: Multidetector CT imaging of the abdomen and pelvis was performed using the standard protocol following bolus administration of intravenous contrast. CONTRAST:  151m ISOVUE-300 IOPAMIDOL (ISOVUE-300) INJECTION 61% COMPARISON:  Abdominal CT dated 05/15/2017 FINDINGS: Lower chest: The visualized lung bases are clear. No intra-abdominal free air or free fluid. Hepatobiliary: Punctate focus of calcification in the right lobe of the liver, likely an old granuloma. The liver is otherwise unremarkable. No intrahepatic biliary ductal dilatation. The gallbladder is unremarkable. Pancreas: Unremarkable. No pancreatic ductal dilatation or surrounding inflammatory changes. Spleen: Normal in size without focal abnormality. Adrenals/Urinary Tract: The adrenal glands are unremarkable. There is a stone measuring 13 x 20 mm in the right renal pelvis with associated obstruction of the right UPJ and mild right hydronephrosis. A 3 mm nonobstructing right renal inferior pole calculus is also  noted. There is right perinephric stranding. Correlation with urinalysis recommended to evaluate for UTI. The left kidney is unremarkable. The visualized ureters and urinary bladder appear  unremarkable. Stomach/Bowel: Postsurgical changes of gastric sleeve. There is no bowel obstruction or active inflammation. The appendix is not visualized, likely surgically absent. Vascular/Lymphatic: Moderate aortoiliac atherosclerotic disease. No portal venous gas. There is no adenopathy. Reproductive: A 2 cm calcified anterior body fibroid. No adnexal masses. Other: Midline vertical anterior abdominal wall incisional scar and postsurgical changes of ventral hernia repair with mesh. Musculoskeletal: Extensive degenerative changes of the spine with multilevel disc desiccation and vacuum phenomena. Grade 1 L3-L4 anterolisthesis. No acute osseous pathology. IMPRESSION: 1. Large right renal pelvis/right UPJ stone with mild right hydronephrosis. Correlation with urinalysis recommended to exclude superimposed UTI. 2.  Aortic Atherosclerosis (ICD10-I70.0). Electronically Signed   By: Anner Crete M.D.   On: 04/13/2018 23:30   Dg C-arm 1-60 Min-no Report  Result Date: 04/14/2018 Fluoroscopy was utilized by the requesting physician.  No radiographic interpretation.    My personal review of EKG: Rhythm NSR, nonspecific T wave changes   Assessment & Plan:    Active Problems:   Sepsis ([place])   1. Sepsis due to UTI-continue ciprofloxacin, will obtain urine culture.  Lactic acid is 1.62.  2. Right renal stone-status post cystoscopy, retrograde ureteral pyelogram and placement of right double-J stent.  Urology following  3. Elevated troponin-likely from demand ischemia.  Patient denies any chest pain.  Cycle troponin every 6 hours x3.  She does have nonspecific changes in T waves and EKG.  Consider cardiology consultation if troponin continues to rise.  4. Hypothyroidism-continue Synthroid  5. COPD-stable, no exacerbation.   DVT Prophylaxis-   Lovenox   AM Labs Ordered, also please review Full Orders  Family Communication: Admission, patients condition and plan of care including tests being  ordered have been discussed with the patient  who indicate understanding and agree with the plan and Code Status.  Code Status: Full code  Admission status: Observation  Time spent in minutes : 60 minutes   Oswald Hillock M.D on 04/14/2018 at 2:42 AM  Between 7am to 7pm - Pager - [phone]. After 7pm go to [email or web address] - password [facility]  [facility] - Office  [phone]

## 2018-04-15 DIAGNOSIS — E032 Hypothyroidism due to medicaments and other exogenous substances: Secondary | ICD-10-CM

## 2018-04-15 MED ORDER — ZOLPIDEM TARTRATE 5 MG PO TABS: 5.0000 mg | ORAL_TABLET | Freq: Every evening | ORAL | Status: DC | PRN

## 2018-04-15 MED ORDER — TRAZODONE HCL 50 MG PO TABS
50.0000 mg | ORAL_TABLET | Freq: Every day | ORAL | Status: DC
  Administered 2018-04-15 – 2018-04-18 (×4): 50 mg via ORAL
  Filled 2018-04-15 (×4): qty 1

## 2018-04-15 MED ORDER — SODIUM CHLORIDE 0.9 % IV SOLN
1.0000 g | Freq: Three times a day (TID) | INTRAVENOUS | Status: DC
  Administered 2018-04-15 – 2018-04-16 (×4): 1 g via INTRAVENOUS
  Filled 2018-04-15 (×7): qty 1

## 2018-04-15 NOTE — Progress Notes (Signed)
Appreciate assistance with management in this lady.  We will arrange eventual follow-up for management of her large stone.  I will be up on Tuesday for rounding if she is still in the hospital.

## 2018-04-15 NOTE — Progress Notes (Signed)
PROGRESS NOTE    Andrea Norton  NWG:[id number] DOB: 04-Jan-1946 DOA: 04/13/2018 PCP: Joette Catching, MD     Brief Narrative:  72 year old woman admitted from home on 9/20 due to fever of 104.4 and encephalopathy.  On CT scan she was found to have a large right renal pelvis/right UPJ stone with right-sided hydronephrosis as well as sepsis due to UTI.  That evening she emergently went to the OR to have a double-J stent placement by urology.  Subsequent blood cultures are growing gram-negative rods with BC ID consistent with E. coli.  Patient was initially admitted on Cipro, on 9/22 antibiotics will be transitioned over to meropenem pending culture data.   Assessment & Plan:   Principal Problem:   Sepsis secondary to UTI [facility]) Active Problems:   Gram negative sepsis (HCC)   Hypothyroidism   Hyperlipidemia   OBESITY, MORBID   Essential hypertension   Coronary atherosclerosis   GASTROESOPHAGEAL REFLUX DISEASE   Sepsis secondary to UTI -Due to acute urinary retention due to large right renal pelvis/UPJ stone with hydronephrosis. -Status post double-J stent placement by urology, will need to follow-up with urology as an outpatient for definitive stone management. -Sepsis parameters have improved. -Blood cultures are now growing 2 out of 2 gram-negative rods with BC ID consistent with E. coli.  Due to antibiogram at [facility] will transition over to meropenem pending sensitivity data.  Elevated troponin -Likely demand ischemia due to severe sepsis and UTI. -Troponins have remained flat, no chest pain, no acute EKG changes. -Will obtain 2D echo to assess EF and wall motion.  No plans to consult cardiology unless significant changes on echo.  Hypothyroidism  -Continue Synthroid  COPD -Stable, no exacerbation   DVT prophylaxis: Lovenox Code Status: Full code Family Communication: Husband at bedside updated on plan of care and all questions answered Disposition Plan:  Anticipate discharge home in 1 to 2 days Consultants:   Urology  Procedures:   Double-J stent placement on 9/21  Antimicrobials:  Anti-infectives (From admission, onward)   Start     Dose/Rate Route Frequency Ordered Stop   04/15/18 0900  meropenem (MERREM) 1 g in sodium chloride 0.9 % 100 mL IVPB     1 g 200 mL/hr over 30 Minutes Intravenous Every 8 hours 04/15/18 0845     04/14/18 1000  ciprofloxacin (CIPRO) IVPB 200 mg  Status:  Discontinued     200 mg 100 mL/hr over 60 Minutes Intravenous Every 12 hours 04/14/18 0746 04/15/18 0815   04/13/18 2200  ciprofloxacin (CIPRO) IVPB 400 mg     400 mg 200 mL/hr over 60 Minutes Intravenous  Once 04/13/18 2148 04/13/18 2350   04/13/18 2200  metroNIDAZOLE (FLAGYL) IVPB 500 mg  Status:  Discontinued     500 mg 100 mL/hr over 60 Minutes Intravenous Every 8 hours 04/13/18 2148 04/14/18 0330       Subjective: Lying in bed, feels much improved, no complaints, states her back pain is improved   Objective: Vitals:   04/14/18 1711 04/14/18 1814 04/14/18 2140 04/15/18 0707  BP: (!) 139/50  124/61 140/62  Pulse: 86  82 98  Resp:   20 20  Temp:  100.2 F (37.9 C) 98.6 F (37 C) 98.6 F (37 C)  TempSrc:  Oral Oral Oral  SpO2:   99% 98%  Weight:      Height:        Intake/Output Summary (Last 24 hours) at 04/15/2018 1134 Last data filed at 04/15/2018  0700 Gross per 24 hour  Intake 2060.11 ml  Output 975 ml  Net 1085.11 ml   Filed Weights   04/13/18 2045  Weight: 113.4 kg    Examination:  General exam: Alert, awake, oriented x 3, obese Respiratory system: Clear to auscultation. Respiratory effort normal. Cardiovascular system:RRR. No murmurs, rubs, gallops. Gastrointestinal system: Abdomen is nondistended, soft and nontender. No organomegaly or masses felt. Normal bowel sounds heard. Central nervous system: Alert and oriented. No focal neurological deficits. Extremities: No C/C/E, +pedal pulses Skin: No rashes, lesions or  ulcers Psychiatry: Judgement and insight appear normal. Mood & affect appropriate.     Data Reviewed: I have personally reviewed following labs and imaging studies  CBC: Recent Labs  Lab 04/13/18 2050 04/14/18 0408  WBC 9.6 14.4*  NEUTROABS 8.5*  --   HGB 12.6 11.5*  HCT 38.5 35.6*  MCV 84.8 85.2  PLT 180 172   Basic Metabolic Panel: Recent Labs  Lab 04/13/18 2050 04/14/18 0305  NA 138  --   K 3.1*  --   CL 107  --   CO2 22  --   GLUCOSE 133*  --   BUN 13  --   CREATININE 0.87 0.80  CALCIUM 8.4*  --    GFR: Estimated Creatinine Clearance: 82.9 mL/min (by C-G formula based on SCr of 0.8 mg/dL). Liver Function Tests: Recent Labs  Lab 04/13/18 2050  AST 23  ALT 15  ALKPHOS 97  BILITOT 2.3*  PROT 7.0  ALBUMIN 3.3*   Recent Labs  Lab 04/13/18 2050  LIPASE 23   No results for input(s): AMMONIA in the last 168 hours. Coagulation Profile: No results for input(s): INR, PROTIME in the last 168 hours. Cardiac Enzymes: Recent Labs  Lab 04/13/18 2050 04/14/18 0305 04/14/18 0849 04/14/18 1451  TROPONINI 0.16* 0.14* 0.16* 0.14*   BNP (last 3 results) No results for input(s): PROBNP in the last 8760 hours. HbA1C: No results for input(s): HGBA1C in the last 72 hours. CBG: No results for input(s): GLUCAP in the last 168 hours. Lipid Profile: No results for input(s): CHOL, HDL, LDLCALC, TRIG, CHOLHDL, LDLDIRECT in the last 72 hours. Thyroid Function Tests: No results for input(s): TSH, T4TOTAL, FREET4, T3FREE, THYROIDAB in the last 72 hours. Anemia Panel: No results for input(s): VITAMINB12, FOLATE, FERRITIN, TIBC, IRON, RETICCTPCT in the last 72 hours. Urine analysis:    Component Value Date/Time   COLORURINE YELLOW 04/13/2018 2120   APPEARANCEUR HAZY (A) 04/13/2018 2120   LABSPEC 1.010 04/13/2018 2120   PHURINE 8.0 04/13/2018 2120   GLUCOSEU NEGATIVE 04/13/2018 2120   HGBUR MODERATE (A) 04/13/2018 2120   BILIRUBINUR NEGATIVE 04/13/2018 2120    KETONESUR NEGATIVE 04/13/2018 2120   PROTEINUR 30 (A) 04/13/2018 2120   UROBILINOGEN 1.0 03/27/2014 1336   NITRITE NEGATIVE 04/13/2018 2120   LEUKOCYTESUR MODERATE (A) 04/13/2018 2120   Sepsis Labs: @LABRCNTIP (procalcitonin:4,lacticidven:4)  ) Recent Results (from the past 240 hour(s))  Blood Culture (routine x 2)     Status: None (Preliminary result)   Collection Time: 04/13/18  8:50 PM  Result Value Ref Range Status   Specimen Description   Final    RIGHT ANTECUBITAL Performed at [facility], [address] [place], [place] [address]    Special Requests   Final    BOTTLES DRAWN AEROBIC AND ANAEROBIC Blood Culture adequate volume Performed at [facility], [address] [place], [place] [address]    Culture  Setup Time   Final    GRAM NEGATIVE  RODS ANAEROBIC Gram Stain Report Called to,Read Back By and Verified With: FOLEY @ 1037 ON [id number] BY HENDERSON L. IN BOTH AEROBIC AND ANAEROBIC BOTTLES Performed at [facility], [address] [place], [place] [address]    Culture GRAM NEGATIVE RODS  Final   Report Status PENDING  Incomplete  Blood Culture (routine x 2)     Status: None (Preliminary result)   Collection Time: 04/13/18  9:15 PM  Result Value Ref Range Status   Specimen Description   Final    BLOOD LEFT ARM Performed at [facility], [address], [place], [place] [address]    Special Requests   Final    BOTTLES DRAWN AEROBIC AND ANAEROBIC Blood Culture adequate volume Performed at [facility], [address] [place], [place] [address]    Culture  Setup Time   Final    GRAM NEGATIVE RODS ANAEROBIC Gram Stain Report Called to,Read Back By and Verified With: FOLEY @ 1037 ON [id number] BY HENDERSON L. IN BOTH AEROBIC AND ANAEROBIC BOTTLES    Culture GRAM NEGATIVE RODS  Final   Report Status PENDING  Incomplete  Blood Culture ID Panel (Reflexed)     Status: Abnormal   Collection Time: 04/13/18  9:15 PM  Result Value Ref Range Status   Enterococcus  species NOT DETECTED NOT DETECTED Final   Listeria monocytogenes NOT DETECTED NOT DETECTED Final   Staphylococcus species NOT DETECTED NOT DETECTED Final   Staphylococcus aureus NOT DETECTED NOT DETECTED Final   Streptococcus species NOT DETECTED NOT DETECTED Final   Streptococcus agalactiae NOT DETECTED NOT DETECTED Final   Streptococcus pneumoniae NOT DETECTED NOT DETECTED Final   Streptococcus pyogenes NOT DETECTED NOT DETECTED Final   Acinetobacter baumannii NOT DETECTED NOT DETECTED Final   Enterobacteriaceae species DETECTED (A) NOT DETECTED Final    Comment: Enterobacteriaceae represent a large family of gram-negative bacteria, not a single organism. CRITICAL RESULT CALLED TO, READ BACK BY AND VERIFIED WITH: B. FOLEY RN, AT 1739 04/14/18 BY D. VANHOOK    Enterobacter cloacae complex NOT DETECTED NOT DETECTED Final   Escherichia coli DETECTED (A) NOT DETECTED Final    Comment: CRITICAL RESULT CALLED TO, READ BACK BY AND VERIFIED WITH: B. FOLEY RN, AT 1739 04/14/18 BY D. VANHOOK    Klebsiella oxytoca NOT DETECTED NOT DETECTED Final   Klebsiella pneumoniae NOT DETECTED NOT DETECTED Final   Proteus species NOT DETECTED NOT DETECTED Final   Serratia marcescens NOT DETECTED NOT DETECTED Final   Carbapenem resistance NOT DETECTED NOT DETECTED Final   Haemophilus influenzae NOT DETECTED NOT DETECTED Final   Neisseria meningitidis NOT DETECTED NOT DETECTED Final   Pseudomonas aeruginosa NOT DETECTED NOT DETECTED Final   Candida albicans NOT DETECTED NOT DETECTED Final   Candida glabrata NOT DETECTED NOT DETECTED Final   Candida krusei NOT DETECTED NOT DETECTED Final   Candida parapsilosis NOT DETECTED NOT DETECTED Final   Candida tropicalis NOT DETECTED NOT DETECTED Final    Comment: Performed at [facility], [address], [place], [place] [address]         Radiology Studies: Dg Chest 1 View  Result Date: 04/13/2018 CLINICAL DATA:  Lethargy, flu like symptoms EXAM:  CHEST  1 VIEW COMPARISON:  05/03/2016 FINDINGS: Mild cardiomegaly. Increased interstitial and hazy opacity at the bases. Aortic atherosclerosis. No pneumothorax. IMPRESSION: 1. Increased interstitial and hazy bibasilar opacity, could reflect edema or interstitial inflammatory process/viral process. 2. Cardiomegaly Electronically Signed   By: Jasmine Pang  M.D.   On: 04/13/2018 22:11   Ct Abdomen Pelvis W Contrast  Result Date: 04/13/2018 CLINICAL DATA:  72 year old female with abdominal pain. EXAM: CT ABDOMEN AND PELVIS WITH CONTRAST TECHNIQUE: Multidetector CT imaging of the abdomen and pelvis was performed using the standard protocol following bolus administration of intravenous contrast. CONTRAST:  ISOVUE-300 IOPAMIDOL (ISOVUE-300) INJECTION 61% COMPARISON:  Abdominal CT dated 05/15/2017 FINDINGS: Lower chest: The visualized lung bases are clear. No intra-abdominal free air or free fluid. Hepatobiliary: Punctate focus of calcification in the right lobe of the liver, likely an old granuloma. The liver is otherwise unremarkable. No intrahepatic biliary ductal dilatation. The gallbladder is unremarkable. Pancreas: Unremarkable. No pancreatic ductal dilatation or surrounding inflammatory changes. Spleen: Normal in size without focal abnormality. Adrenals/Urinary Tract: The adrenal glands are unremarkable. There is a stone measuring 13 x 20 mm in the right renal pelvis with associated obstruction of the right UPJ and mild right hydronephrosis. A 3 mm nonobstructing right renal inferior pole calculus is also noted. There is right perinephric stranding. Correlation with urinalysis recommended to evaluate for UTI. The left kidney is unremarkable. The visualized ureters and urinary bladder appear unremarkable. Stomach/Bowel: Postsurgical changes of gastric sleeve. There is no bowel obstruction or active inflammation. The appendix is not visualized, likely surgically absent. Vascular/Lymphatic: Moderate  aortoiliac atherosclerotic disease. No portal venous gas. There is no adenopathy. Reproductive: A 2 cm calcified anterior body fibroid. No adnexal masses. Other: Midline vertical anterior abdominal wall incisional scar and postsurgical changes of ventral hernia repair with mesh. Musculoskeletal: Extensive degenerative changes of the spine with multilevel disc desiccation and vacuum phenomena. Grade 1 L3-L4 anterolisthesis. No acute osseous pathology. IMPRESSION: 1. Large right renal pelvis/right UPJ stone with mild right hydronephrosis. Correlation with urinalysis recommended to exclude superimposed UTI. 2.  Aortic Atherosclerosis (ICD10-I70.0). Electronically Signed   By: Elgie Collard M.D.   On: 04/13/2018 23:30   Dg C-arm 1-60 Min-no Report  Result Date: 04/14/2018 Fluoroscopy was utilized by the requesting physician.  No radiographic interpretation.        Scheduled Meds: . aspirin EC  81 mg Oral QHS  . enoxaparin (LOVENOX) injection  40 mg Subcutaneous Q24H  . escitalopram  10 mg Oral Q lunch  . gabapentin  300 mg Oral BID  . levothyroxine  125 mcg Oral QAC breakfast  . pantoprazole  40 mg Oral Q lunch  . simvastatin  40 mg Oral Q lunch   Continuous Infusions: . sodium chloride 75 mL/hr at 04/15/18 0800  . meropenem (MERREM) IV 1 g (04/15/18 0947)     LOS: 1 day    Time spent: 35 minutes. Greater than 50% of this time was spent in direct contact with the patient and with patient's husband, coordinating care and discussing relevant ongoing clinical issues, including improvement in sepsis parameters, plan to change antibiotics while we await culture sensitivity, have also advised that she will need close outpatient follow-up with urology for definitive stone management.     Chaya Jan, MD [facility] Pager [phone]  If 7PM-7AM, please contact night-coverage [email or web address] Password [facility] 04/15/2018, 11:34 AM

## 2018-04-15 NOTE — Progress Notes (Signed)
Pharmacy Antibiotic Note  Lear NgGloria O Palomarez is a 72 y.o. female admitted on 04/13/2018 admitted for complaints of fever and pain with urination. Cipro has been discontinued and   pharmacy has been consulted for meropenem  dosing due to positive blood cultures for E. Coli. Resistance has not yet been determined.  Plan: Start meropenem  1g  IV q8h Pharmacy will continue to monitor labs, cultures and patient progress.  Height: 5' 5.5" (166.4 cm) Weight: 250 lb (113.4 kg) IBW/kg (Calculated) : 58.15  Temp (24hrs), Avg:99.2 F (37.3 C), Min:98.6 F (37 C), Max:100.2 F (37.9 C)  Recent Labs  Lab 04/13/18 2050 04/13/18 2115 04/14/18 0305 04/14/18 0408  WBC 9.6  --   --  14.4*  CREATININE 0.87  --  0.80  --   LATICACIDVEN  --  1.62  --   --     Estimated Creatinine Clearance: 82.9 mL/min (by C-G formula based on SCr of 0.8 mg/dL).    Allergies  Allergen Reactions  . Morphine And Related Other (See Comments)    Hallunications, confusion for 1 month    Antimicrobials this admission: Metronidazole 9/20 >>9/20 Ciprofloxacin 9/20 >> 9/22 meropenem 9/22>>  Microbiology results: 9/20 BCx2:(left arm): Escherichia coli 9/20: BC x2 (rt antecubital): GNR in both aerobic/anaerobic   Thank you for allowing pharmacy to be a part of this patient's care.  Tama Highamara Callia Swim 04/15/2018 9:00 AM

## 2018-04-16 ENCOUNTER — Encounter (HOSPITAL_COMMUNITY): Payer: Self-pay | Admitting: Urology

## 2018-04-16 ENCOUNTER — Inpatient Hospital Stay (HOSPITAL_COMMUNITY): Payer: Medicare Other

## 2018-04-16 DIAGNOSIS — I340 Nonrheumatic mitral (valve) insufficiency: Secondary | ICD-10-CM

## 2018-04-16 LAB — CULTURE, BLOOD (ROUTINE X 2)
Special Requests: ADEQUATE
Special Requests: ADEQUATE

## 2018-04-16 LAB — BASIC METABOLIC PANEL
ANION GAP: 6 (ref 5–15)
BUN: 10 mg/dL (ref 8–23)
CHLORIDE: 110 mmol/L (ref 98–111)
CO2: 23 mmol/L (ref 22–32)
Calcium: 8.2 mg/dL — ABNORMAL LOW (ref 8.9–10.3)
Creatinine, Ser: 0.82 mg/dL (ref 0.44–1.00)
GFR calc Af Amer: >60 mL/min (ref >60)
Glucose, Bld: 105 mg/dL — ABNORMAL HIGH (ref 70–99)
POTASSIUM: 3.6 mmol/L (ref 3.5–5.1)
Sodium: 139 mmol/L (ref 135–145)

## 2018-04-16 LAB — URINE CULTURE

## 2018-04-16 LAB — CBC
HEMATOCRIT: 33.6 % — AB (ref 36.0–46.0)
HEMOGLOBIN: 10.6 g/dL — AB (ref 12.0–15.0)
MCH: 27.3 pg (ref 26.0–34.0)
MCHC: 31.5 g/dL (ref 30.0–36.0)
MCV: 86.6 fL (ref 78.0–100.0)
Platelets: 164 10*3/uL (ref 150–400)
RBC: 3.88 MIL/uL (ref 3.87–5.11)
RDW: 14.8 % (ref 11.5–15.5)
WBC: 6.2 10*3/uL (ref 4.0–10.5)

## 2018-04-16 LAB — ECHOCARDIOGRAM COMPLETE
Height: 65.5 in
Weight: 4000.03 oz

## 2018-04-16 MED ORDER — IBUPROFEN 400 MG PO TABS
400.0000 mg | ORAL_TABLET | Freq: Three times a day (TID) | ORAL | Status: DC | PRN
  Administered 2018-04-16 – 2018-04-18 (×4): 400 mg via ORAL
  Filled 2018-04-16 (×4): qty 1

## 2018-04-16 MED ORDER — SODIUM CHLORIDE 0.9 % IV SOLN
2.0000 g | INTRAVENOUS | Status: DC
  Administered 2018-04-16 – 2018-04-18 (×3): 2 g via INTRAVENOUS
  Filled 2018-04-16 (×2): qty 20
  Filled 2018-04-16 (×2): qty 2

## 2018-04-16 NOTE — Care Management Important Message (Signed)
Important Message  Patient Details  Name: Andrea Norton MRN: [id number] Date of Birth: 1946/05/07   Medicare Important Message Given:  Yes    Renie OraHawkins, Jocilyn Trego Smith 04/16/2018, 11:24 AM

## 2018-04-16 NOTE — Progress Notes (Signed)
PROGRESS NOTE    Andrea Norton  YQM:[id number] DOB: [birth year] DOA: 04/13/2018 PCP: Joette Catching, MD     Brief Narrative:  72 year old woman admitted from home on 9/20 due to fever of 104.4 and encephalopathy.  On CT scan she was found to have a large right renal pelvis/right UPJ stone with right-sided hydronephrosis as well as sepsis due to UTI.  That evening she emergently went to the OR to have a double-J stent placement by urology.  Subsequent blood cultures are growing gram-negative rods with BC ID consistent with E. coli.  Patient was initially admitted on Cipro, on 9/22 antibiotics will be transitioned over to meropenem pending culture data.   Assessment & Plan:   Principal Problem:   Sepsis secondary to UTI [facility]) Active Problems:   Gram negative sepsis (HCC)   Hypothyroidism   Hyperlipidemia   OBESITY, MORBID   Essential hypertension   Coronary atherosclerosis   GASTROESOPHAGEAL REFLUX DISEASE   Sepsis secondary to UTI -Due to acute urinary retention due to large right renal pelvis/UPJ stone with hydronephrosis. -Status post double-J stent placement by urology, will need to follow-up with urology as an outpatient for definitive stone management. -Sepsis parameters have improved. -She spiked a temperature of 103 this a.m.  Although this is her third day of antibiotics, this is only the first 24 hours that she has been on meropenem, initial prescription of ciprofloxacin would have been resistant against this E. coli. -Blood and urine cultures with E. coli resistant to Cipro but sensitive to Rocephin.  Based on this sensitivity pattern, will discontinue meropenem and place on Rocephin.  Elevated troponin -Likely demand ischemia due to severe sepsis and UTI. -Troponins have remained flat, no chest pain, no acute EKG changes. -2D echo pending to assess EF and wall motion.  No plans to consult cardiology unless significant changes on echo.  Hypothyroidism  -Continue  Synthroid  COPD -Stable, no exacerbation   DVT prophylaxis: Lovenox Code Status: Full code Family Communication: Husband at bedside updated on plan of care and all questions answered Disposition Plan: Anticipate discharge home in 2 to 3 days Consultants:   Urology  Procedures:   Double-J stent placement on 9/21  Antimicrobials:  Anti-infectives (From admission, onward)   Start     Dose/Rate Route Frequency Ordered Stop   04/15/18 0900  meropenem (MERREM) 1 g in sodium chloride 0.9 % 100 mL IVPB     1 g 200 mL/hr over 30 Minutes Intravenous Every 8 hours 04/15/18 0845     04/14/18 1000  ciprofloxacin (CIPRO) IVPB 200 mg  Status:  Discontinued     200 mg 100 mL/hr over 60 Minutes Intravenous Every 12 hours 04/14/18 0746 04/15/18 0815   04/13/18 2200  ciprofloxacin (CIPRO) IVPB 400 mg     400 mg 200 mL/hr over 60 Minutes Intravenous  Once 04/13/18 2148 04/13/18 2350   04/13/18 2200  metroNIDAZOLE (FLAGYL) IVPB 500 mg  Status:  Discontinued     500 mg 100 mL/hr over 60 Minutes Intravenous Every 8 hours 04/13/18 2148 04/14/18 0330       Subjective: Lying in bed, felt feverish and had rigors earlier today.  Much improved when I see her.   Objective: Vitals:   04/16/18 0540 04/16/18 1114 04/16/18 1350 04/16/18 1448  BP: (!) 142/67  (!) 148/73   Pulse: 87  89   Resp: 17  20   Temp: (!) 103 F (39.4 C) 99 F (37.2 C) 99.6 F (37.6 C) (!) 100.6  F (38.1 C)  TempSrc: Oral Oral Oral Oral  SpO2: 100%  100%   Weight:      Height:        Intake/Output Summary (Last 24 hours) at 04/16/2018 1452 Last data filed at 04/16/2018 0800 Gross per 24 hour  Intake 1326.65 ml  Output 1550 ml  Net -223.35 ml   Filed Weights   04/13/18 2045  Weight: 113.4 kg    Examination:  General exam: Alert, awake, oriented x 3, obese Respiratory system: Clear to auscultation. Respiratory effort normal. Cardiovascular system:RRR. No murmurs, rubs, gallops. Gastrointestinal system:  Abdomen is nondistended, soft and nontender. No organomegaly or masses felt. Normal bowel sounds heard. Central nervous system: Alert and oriented. No focal neurological deficits. Extremities: No C/C/E, +pedal pulses Skin: No rashes, lesions or ulcers Psychiatry: Judgement and insight appear normal. Mood & affect appropriate.      Data Reviewed: I have personally reviewed following labs and imaging studies  CBC: Recent Labs  Lab 04/13/18 2050 04/14/18 0408 04/16/18 0547  WBC 9.6 14.4* 6.2  NEUTROABS 8.5*  --   --   HGB 12.6 11.5* 10.6*  HCT 38.5 35.6* 33.6*  MCV 84.8 85.2 86.6  PLT 180 172 164   Basic Metabolic Panel: Recent Labs  Lab 04/13/18 2050 04/14/18 0305 04/16/18 0547  NA 138  --  139  K 3.1*  --  3.6  CL 107  --  110  CO2 22  --  23  GLUCOSE 133*  --  105*  BUN 13  --  10  CREATININE 0.87 0.80 0.82  CALCIUM 8.4*  --  8.2*   GFR: Estimated Creatinine Clearance: 80.9 mL/min (by C-G formula based on SCr of 0.82 mg/dL). Liver Function Tests: Recent Labs  Lab 04/13/18 2050  AST 23  ALT 15  ALKPHOS 97  BILITOT 2.3*  PROT 7.0  ALBUMIN 3.3*   Recent Labs  Lab 04/13/18 2050  LIPASE 23   No results for input(s): AMMONIA in the last 168 hours. Coagulation Profile: No results for input(s): INR, PROTIME in the last 168 hours. Cardiac Enzymes: Recent Labs  Lab 04/13/18 2050 04/14/18 0305 04/14/18 0849 04/14/18 1451  TROPONINI 0.16* 0.14* 0.16* 0.14*   BNP (last 3 results) No results for input(s): PROBNP in the last 8760 hours. HbA1C: No results for input(s): HGBA1C in the last 72 hours. CBG: No results for input(s): GLUCAP in the last 168 hours. Lipid Profile: No results for input(s): CHOL, HDL, LDLCALC, TRIG, CHOLHDL, LDLDIRECT in the last 72 hours. Thyroid Function Tests: No results for input(s): TSH, T4TOTAL, FREET4, T3FREE, THYROIDAB in the last 72 hours. Anemia Panel: No results for input(s): VITAMINB12, FOLATE, FERRITIN, TIBC, IRON,  RETICCTPCT in the last 72 hours. Urine analysis:    Component Value Date/Time   COLORURINE YELLOW 04/13/2018 2120   APPEARANCEUR HAZY (A) 04/13/2018 2120   LABSPEC 1.010 04/13/2018 2120   PHURINE 8.0 04/13/2018 2120   GLUCOSEU NEGATIVE 04/13/2018 2120   HGBUR MODERATE (A) 04/13/2018 2120   BILIRUBINUR NEGATIVE 04/13/2018 2120   KETONESUR NEGATIVE 04/13/2018 2120   PROTEINUR 30 (A) 04/13/2018 2120   UROBILINOGEN 1.0 03/27/2014 1336   NITRITE NEGATIVE 04/13/2018 2120   LEUKOCYTESUR MODERATE (A) 04/13/2018 2120   Sepsis Labs: @LABRCNTIP (procalcitonin:4,lacticidven:4)  ) Recent Results (from the past 240 hour(s))  Blood Culture (routine x 2)     Status: Abnormal   Collection Time: 04/13/18  8:50 PM  Result Value Ref Range Status   Specimen Description   Final  RIGHT ANTECUBITAL Performed at [facility], [address], [place], [place] [address]    Special Requests   Final    BOTTLES DRAWN AEROBIC AND ANAEROBIC Blood Culture adequate volume Performed at [facility], [address], [place], [place] [address]    Culture  Setup Time   Final    GRAM NEGATIVE RODS ANAEROBIC Gram Stain Report Called to,Read Back By and Verified With: FOLEY @ 1037 ON [id number] BY HENDERSON L. IN BOTH AEROBIC AND ANAEROBIC BOTTLES    Culture (A)  Final    ESCHERICHIA COLI SUSCEPTIBILITIES PERFORMED ON PREVIOUS CULTURE WITHIN THE LAST 5 DAYS. Performed at [facility], [address] [place], [place] [address]    Report Status 04/16/2018 FINAL  Final  Blood Culture (routine x 2)     Status: Abnormal   Collection Time: 04/13/18  9:15 PM  Result Value Ref Range Status   Specimen Description   Final    BLOOD LEFT ARM Performed at [facility], [address] [place], [place] [address]    Special Requests   Final    BOTTLES DRAWN AEROBIC AND ANAEROBIC Blood Culture adequate volume Performed at [facility], [address] [place], [place] [address]    Culture  Setup Time   Final     GRAM NEGATIVE RODS ANAEROBIC Gram Stain Report Called to,Read Back By and Verified With: FOLEY @ 1037 ON [id number] BY HENDERSON L. IN BOTH AEROBIC AND ANAEROBIC BOTTLES    Culture ESCHERICHIA COLI (A)  Final   Report Status 04/16/2018 FINAL  Final   Organism ID, Bacteria ESCHERICHIA COLI  Final      Susceptibility   Escherichia coli - MIC*    AMPICILLIN >=32 RESISTANT Resistant     CEFAZOLIN <=4 SENSITIVE Sensitive     CEFEPIME <=1 SENSITIVE Sensitive     CEFTAZIDIME <=1 SENSITIVE Sensitive     CEFTRIAXONE <=1 SENSITIVE Sensitive     CIPROFLOXACIN >=4 RESISTANT Resistant     GENTAMICIN <=1 SENSITIVE Sensitive     IMIPENEM <=0.25 SENSITIVE Sensitive     TRIMETH/SULFA >=320 RESISTANT Resistant     AMPICILLIN/SULBACTAM >=32 RESISTANT Resistant     PIP/TAZO <=4 SENSITIVE Sensitive     Extended ESBL NEGATIVE Sensitive     * ESCHERICHIA COLI  Blood Culture ID Panel (Reflexed)     Status: Abnormal   Collection Time: 04/13/18  9:15 PM  Result Value Ref Range Status   Enterococcus species NOT DETECTED NOT DETECTED Final   Listeria monocytogenes NOT DETECTED NOT DETECTED Final   Staphylococcus species NOT DETECTED NOT DETECTED Final   Staphylococcus aureus NOT DETECTED NOT DETECTED Final   Streptococcus species NOT DETECTED NOT DETECTED Final   Streptococcus agalactiae NOT DETECTED NOT DETECTED Final   Streptococcus pneumoniae NOT DETECTED NOT DETECTED Final   Streptococcus pyogenes NOT DETECTED NOT DETECTED Final   Acinetobacter baumannii NOT DETECTED NOT DETECTED Final   Enterobacteriaceae species DETECTED (A) NOT DETECTED Final    Comment: Enterobacteriaceae represent a large family of gram-negative bacteria, not a single organism. CRITICAL RESULT CALLED TO, READ BACK BY AND VERIFIED WITH: B. FOLEY RN, AT 1739 04/14/18 BY D. VANHOOK    Enterobacter cloacae complex NOT DETECTED NOT DETECTED Final   Escherichia coli DETECTED (A) NOT DETECTED Final    Comment: CRITICAL RESULT CALLED TO,  READ BACK BY AND VERIFIED WITH: B. FOLEY RN, AT 1739 04/14/18 BY D. VANHOOK    Klebsiella oxytoca NOT DETECTED NOT DETECTED Final   Klebsiella  pneumoniae NOT DETECTED NOT DETECTED Final   Proteus species NOT DETECTED NOT DETECTED Final   Serratia marcescens NOT DETECTED NOT DETECTED Final   Carbapenem resistance NOT DETECTED NOT DETECTED Final   Haemophilus influenzae NOT DETECTED NOT DETECTED Final   Neisseria meningitidis NOT DETECTED NOT DETECTED Final   Pseudomonas aeruginosa NOT DETECTED NOT DETECTED Final   Candida albicans NOT DETECTED NOT DETECTED Final   Candida glabrata NOT DETECTED NOT DETECTED Final   Candida krusei NOT DETECTED NOT DETECTED Final   Candida parapsilosis NOT DETECTED NOT DETECTED Final   Candida tropicalis NOT DETECTED NOT DETECTED Final    Comment: Performed at [facility], [address] [place], [place] [address]  Culture, Urine     Status: Abnormal   Collection Time: 04/13/18  9:25 PM  Result Value Ref Range Status   Specimen Description   Final    URINE, CLEAN CATCH Performed at [facility], [address] [place], [place] [address]    Special Requests   Final    NONE Performed at [facility], [address] [place], [place] [address]    Culture >=100,000 COLONIES/mL ESCHERICHIA COLI (A)  Final   Report Status 04/16/2018 FINAL  Final   Organism ID, Bacteria ESCHERICHIA COLI (A)  Final      Susceptibility   Escherichia coli - MIC*    AMPICILLIN >=32 RESISTANT Resistant     CEFAZOLIN <=4 SENSITIVE Sensitive     CEFTRIAXONE <=1 SENSITIVE Sensitive     CIPROFLOXACIN >=4 RESISTANT Resistant     GENTAMICIN <=1 SENSITIVE Sensitive     IMIPENEM <=0.25 SENSITIVE Sensitive     NITROFURANTOIN <=16 SENSITIVE Sensitive     TRIMETH/SULFA >=320 RESISTANT Resistant     AMPICILLIN/SULBACTAM 16 INTERMEDIATE Intermediate     PIP/TAZO <=4 SENSITIVE Sensitive     Extended ESBL NEGATIVE Sensitive     * >=100,000 COLONIES/mL ESCHERICHIA COLI          Radiology Studies: No results found.      Scheduled Meds: . aspirin EC  81 mg Oral QHS  . enoxaparin (LOVENOX) injection  40 mg Subcutaneous Q24H  . escitalopram  10 mg Oral Q lunch  . gabapentin  300 mg Oral BID  . levothyroxine  125 mcg Oral QAC breakfast  . pantoprazole  40 mg Oral Q lunch  . simvastatin  40 mg Oral Q lunch  . traZODone  50 mg Oral QHS   Continuous Infusions: . sodium chloride 75 mL/hr at 04/16/18 0006  . meropenem (MERREM) IV 1 g (04/16/18 0901)     LOS: 2 days    Time spent: 35 minutes. Greater than 50% of this time was spent in direct contact with the patient and with patient's husband, coordinating care and discussing relevant ongoing clinical issues, including improvement in sepsis parameters, plan to change antibiotics based on sensitivity pattern, have also advised that she will need close outpatient follow-up with urology for definitive stone management.     Chaya JanEstela Hernandez Acosta, MD [facility] Pager [phone]  If 7PM-7AM, please contact night-coverage [email or web address] Password [email or web address] 04/16/2018, 2:52 PM

## 2018-04-16 NOTE — Addendum Note (Signed)
Addendum  created 04/16/18 0752 by Franco NonesYates, Inetha Maret S, CRNA   Charge Capture section accepted

## 2018-04-16 NOTE — Progress Notes (Signed)
*  PRELIMINARY RESULTS* Echocardiogram 2D Echocardiogram has been performed.  Jeryl Columbialliott, Mayli Covington 04/16/2018, 2:58 PM

## 2018-04-17 DIAGNOSIS — N200 Calculus of kidney: Secondary | ICD-10-CM

## 2018-04-17 LAB — BASIC METABOLIC PANEL
Anion gap: 6 (ref 5–15)
BUN: 8 mg/dL (ref 8–23)
CALCIUM: 8 mg/dL — AB (ref 8.9–10.3)
CO2: 22 mmol/L (ref 22–32)
CREATININE: 0.7 mg/dL (ref 0.44–1.00)
Chloride: 114 mmol/L — ABNORMAL HIGH (ref 98–111)
GFR calc Af Amer: >60 mL/min (ref >60)
GFR calc non Af Amer: >60 mL/min (ref >60)
GLUCOSE: 114 mg/dL — AB (ref 70–99)
Potassium: 3.3 mmol/L — ABNORMAL LOW (ref 3.5–5.1)
Sodium: 142 mmol/L (ref 135–145)

## 2018-04-17 LAB — CBC
HEMATOCRIT: 33.1 % — AB (ref 36.0–46.0)
HEMOGLOBIN: 10.7 g/dL — AB (ref 12.0–15.0)
MCH: 27.6 pg (ref 26.0–34.0)
MCHC: 32.3 g/dL (ref 30.0–36.0)
MCV: 85.5 fL (ref 78.0–100.0)
Platelets: 175 10*3/uL (ref 150–400)
RBC: 3.87 MIL/uL (ref 3.87–5.11)
RDW: 14.7 % (ref 11.5–15.5)
WBC: 4 10*3/uL (ref 4.0–10.5)

## 2018-04-17 LAB — HIV ANTIBODY (ROUTINE TESTING W REFLEX): HIV SCREEN 4TH GENERATION: NONREACTIVE

## 2018-04-17 MED ORDER — POTASSIUM CHLORIDE CRYS ER 20 MEQ PO TBCR
40.0000 meq | EXTENDED_RELEASE_TABLET | Freq: Once | ORAL | Status: AC
  Administered 2018-04-17: 40 meq via ORAL
  Filled 2018-04-17: qty 2

## 2018-04-17 NOTE — Progress Notes (Signed)
PROGRESS NOTE    Andrea Norton  WGN:[id number] DOB: Jul 14, 1946 DOA: 04/13/2018 PCP: Joette Catching, MD     Brief Narrative:  72 year old woman admitted from home on 9/20 due to fever of 104.4 and encephalopathy.  On CT scan she was found to have a large right renal pelvis/right UPJ stone with right-sided hydronephrosis as well as sepsis due to UTI.  That evening she emergently went to the OR to have a double-J stent placement by urology.  Subsequent blood cultures were growing gram-negative rods with BC ID consistent with E. coli.  Patient was initially admitted on Cipro, on 9/22 antibiotics were transitioned over to meropenem pending culture data given significant ESBL E. coli at [facility].  Subsequently blood and urine cultures grew E. coli that was resistant to Cipro but sensitive to meropenem and ceftriaxone.  Antibiotics were de-escalated to ceftriaxone on 9/23.  As of 9/24 she is clinically improving.  Her last temperature was around 7 PM the night before.  Would like her to be a full 24 hours without fever prior to discharge.  She has been seen by urology on 9/24 who is recommending Foley catheter be discontinued.   Assessment & Plan:   Principal Problem:   Sepsis secondary to UTI [facility]) Active Problems:   Gram negative sepsis (HCC)   Hypothyroidism   Hyperlipidemia   OBESITY, MORBID   Essential hypertension   Coronary atherosclerosis   GASTROESOPHAGEAL REFLUX DISEASE   Sepsis secondary to UTI -Due to acute urinary retention due to large right renal pelvis/UPJ stone with hydronephrosis. -Status post double-J stent placement by urology, will need to follow-up with urology as an outpatient for definitive stone management. -Sepsis parameters have improved. -Continue Rocephin per culture data, discharge home hopefully in a.m. if 24 hours without temperature. -Per urology okay to discontinue Foley catheter.  Elevated troponin -Likely demand ischemia due to severe sepsis and  UTI. -Troponins have remained flat, no chest pain, no acute EKG changes. -2D echo: Ejection fraction of 50 to 55% with normal wall motion and normal LV diastolic function parameters. -No plans for further cardiac work-up.  Hypothyroidism  -Continue Synthroid  COPD -Stable, no exacerbation   DVT prophylaxis: Lovenox Code Status: Full code Family Communication: Husband at bedside updated on plan of care and all questions answered on 9/23 Disposition Plan: Anticipate discharge home in a.m. Consultants:   Urology  Procedures:   Double-J stent placement on 9/21  Antimicrobials:  Anti-infectives (From admission, onward)   Start     Dose/Rate Route Frequency Ordered Stop   04/16/18 1600  cefTRIAXone (ROCEPHIN) 2 g in sodium chloride 0.9 % 100 mL IVPB     2 g 200 mL/hr over 30 Minutes Intravenous Every 24 hours 04/16/18 1504     04/15/18 0900  meropenem (MERREM) 1 g in sodium chloride 0.9 % 100 mL IVPB  Status:  Discontinued     1 g 200 mL/hr over 30 Minutes Intravenous Every 8 hours 04/15/18 0845 04/16/18 1504   04/14/18 1000  ciprofloxacin (CIPRO) IVPB 200 mg  Status:  Discontinued     200 mg 100 mL/hr over 60 Minutes Intravenous Every 12 hours 04/14/18 0746 04/15/18 0815   04/13/18 2200  ciprofloxacin (CIPRO) IVPB 400 mg     400 mg 200 mL/hr over 60 Minutes Intravenous  Once 04/13/18 2148 04/13/18 2350   04/13/18 2200  metroNIDAZOLE (FLAGYL) IVPB 500 mg  Status:  Discontinued     500 mg 100 mL/hr over 60 Minutes Intravenous Every  8 hours 04/13/18 2148 04/14/18 0330       Subjective: In bed, no complaints, no fever since yesterday night, overall feels improved.   Objective: Vitals:   04/16/18 1727 04/16/18 2202 04/17/18 0527 04/17/18 1126  BP:  (!) 111/52 136/75   Pulse:  71 92   Resp:  18 (!) 22   Temp: (!) 100.5 F (38.1 C) 98 F (36.7 C) 98.6 F (37 C) (!) 100.7 F (38.2 C)  TempSrc: Oral Oral Oral Oral  SpO2:  97% 94%   Weight:      Height:         Intake/Output Summary (Last 24 hours) at 04/17/2018 1407 Last data filed at 04/17/2018 1300 Gross per 24 hour  Intake 3447.74 ml  Output 2650 ml  Net 797.74 ml   Filed Weights   04/13/18 2045  Weight: 113.4 kg    Examination:  General exam: Alert, awake, oriented x 3 Respiratory system: Clear to auscultation. Respiratory effort normal. Cardiovascular system:RRR. No murmurs, rubs, gallops. Gastrointestinal system: Abdomen is nondistended, soft and nontender. No organomegaly or masses felt. Normal bowel sounds heard. Central nervous system: Alert and oriented. No focal neurological deficits. Extremities: No C/C/E, +pedal pulses Skin: No rashes, lesions or ulcers Psychiatry: Judgement and insight appear normal. Mood & affect appropriate.        Data Reviewed: I have personally reviewed following labs and imaging studies  CBC: Recent Labs  Lab 04/13/18 2050 04/14/18 0408 04/16/18 0547 04/17/18 0536  WBC 9.6 14.4* 6.2 4.0  NEUTROABS 8.5*  --   --   --   HGB 12.6 11.5* 10.6* 10.7*  HCT 38.5 35.6* 33.6* 33.1*  MCV 84.8 85.2 86.6 85.5  PLT 180 172 164 175   Basic Metabolic Panel: Recent Labs  Lab 04/13/18 2050 04/14/18 0305 04/16/18 0547 04/17/18 0536  NA 138  --  139 142  K 3.1*  --  3.6 3.3*  CL 107  --  110 114*  CO2 22  --  23 22  GLUCOSE 133*  --  105* 114*  BUN 13  --  10 8  CREATININE 0.87 0.80 0.82 0.70  CALCIUM 8.4*  --  8.2* 8.0*   GFR: Estimated Creatinine Clearance: 82.9 mL/min (by C-G formula based on SCr of 0.7 mg/dL). Liver Function Tests: Recent Labs  Lab 04/13/18 2050  AST 23  ALT 15  ALKPHOS 97  BILITOT 2.3*  PROT 7.0  ALBUMIN 3.3*   Recent Labs  Lab 04/13/18 2050  LIPASE 23   No results for input(s): AMMONIA in the last 168 hours. Coagulation Profile: No results for input(s): INR, PROTIME in the last 168 hours. Cardiac Enzymes: Recent Labs  Lab 04/13/18 2050 04/14/18 0305 04/14/18 0849 04/14/18 1451  TROPONINI  0.16* 0.14* 0.16* 0.14*   BNP (last 3 results) No results for input(s): PROBNP in the last 8760 hours. HbA1C: No results for input(s): HGBA1C in the last 72 hours. CBG: No results for input(s): GLUCAP in the last 168 hours. Lipid Profile: No results for input(s): CHOL, HDL, LDLCALC, TRIG, CHOLHDL, LDLDIRECT in the last 72 hours. Thyroid Function Tests: No results for input(s): TSH, T4TOTAL, FREET4, T3FREE, THYROIDAB in the last 72 hours. Anemia Panel: No results for input(s): VITAMINB12, FOLATE, FERRITIN, TIBC, IRON, RETICCTPCT in the last 72 hours. Urine analysis:    Component Value Date/Time   COLORURINE YELLOW 04/13/2018 2120   APPEARANCEUR HAZY (A) 04/13/2018 2120   LABSPEC 1.010 04/13/2018 2120   PHURINE 8.0 04/13/2018 2120  GLUCOSEU NEGATIVE 04/13/2018 2120   HGBUR MODERATE (A) 04/13/2018 2120   BILIRUBINUR NEGATIVE 04/13/2018 2120   KETONESUR NEGATIVE 04/13/2018 2120   PROTEINUR 30 (A) 04/13/2018 2120   UROBILINOGEN 1.0 03/27/2014 1336   NITRITE NEGATIVE 04/13/2018 2120   LEUKOCYTESUR MODERATE (A) 04/13/2018 2120   Sepsis Labs: @LABRCNTIP (procalcitonin:4,lacticidven:4)  ) Recent Results (from the past 240 hour(s))  Blood Culture (routine x 2)     Status: Abnormal   Collection Time: 04/13/18  8:50 PM  Result Value Ref Range Status   Specimen Description   Final    RIGHT ANTECUBITAL Performed at [facility], [address] [place], [place] [address]    Special Requests   Final    BOTTLES DRAWN AEROBIC AND ANAEROBIC Blood Culture adequate volume Performed at [facility], [address] [place], [place] [address]    Culture  Setup Time   Final    GRAM NEGATIVE RODS ANAEROBIC Gram Stain Report Called to,Read Back By and Verified With: FOLEY @ 1037 ON [id number] BY HENDERSON L. IN BOTH AEROBIC AND ANAEROBIC BOTTLES    Culture (A)  Final    ESCHERICHIA COLI SUSCEPTIBILITIES PERFORMED ON PREVIOUS CULTURE WITHIN THE LAST 5 DAYS. Performed at [facility], [address] [place], [place] [address]    Report Status 04/16/2018 FINAL  Final  Blood Culture (routine x 2)     Status: Abnormal   Collection Time: 04/13/18  9:15 PM  Result Value Ref Range Status   Specimen Description   Final    BLOOD LEFT ARM Performed at [facility], [address] [place], [place] [address]    Special Requests   Final    BOTTLES DRAWN AEROBIC AND ANAEROBIC Blood Culture adequate volume Performed at [facility], [address] [place], [place] [address]    Culture  Setup Time   Final    GRAM NEGATIVE RODS ANAEROBIC Gram Stain Report Called to,Read Back By and Verified With: FOLEY @ 1037 ON [id number] BY HENDERSON L. IN BOTH AEROBIC AND ANAEROBIC BOTTLES    Culture ESCHERICHIA COLI (A)  Final   Report Status 04/16/2018 FINAL  Final   Organism ID, Bacteria ESCHERICHIA COLI  Final      Susceptibility   Escherichia coli - MIC*    AMPICILLIN >=32 RESISTANT Resistant     CEFAZOLIN <=4 SENSITIVE Sensitive     CEFEPIME <=1 SENSITIVE Sensitive     CEFTAZIDIME <=1 SENSITIVE Sensitive     CEFTRIAXONE <=1 SENSITIVE Sensitive     CIPROFLOXACIN >=4 RESISTANT Resistant     GENTAMICIN <=1 SENSITIVE Sensitive     IMIPENEM <=0.25 SENSITIVE Sensitive     TRIMETH/SULFA >=320 RESISTANT Resistant     AMPICILLIN/SULBACTAM >=32 RESISTANT Resistant     PIP/TAZO <=4 SENSITIVE Sensitive     Extended ESBL NEGATIVE Sensitive     * ESCHERICHIA COLI  Blood Culture ID Panel (Reflexed)     Status: Abnormal   Collection Time: 04/13/18  9:15 PM  Result Value Ref Range Status   Enterococcus species NOT DETECTED NOT DETECTED Final   Listeria monocytogenes NOT DETECTED NOT DETECTED Final   Staphylococcus species NOT DETECTED NOT DETECTED Final   Staphylococcus aureus NOT DETECTED NOT DETECTED Final   Streptococcus species NOT DETECTED NOT DETECTED Final   Streptococcus agalactiae NOT DETECTED NOT DETECTED Final   Streptococcus pneumoniae NOT DETECTED NOT DETECTED Final    Streptococcus pyogenes NOT DETECTED NOT DETECTED Final   Acinetobacter baumannii NOT DETECTED NOT DETECTED Final  Enterobacteriaceae species DETECTED (A) NOT DETECTED Final    Comment: Enterobacteriaceae represent a large family of gram-negative bacteria, not a single organism. CRITICAL RESULT CALLED TO, READ BACK BY AND VERIFIED WITH: B. FOLEY RN, AT 1739 04/14/18 BY D. VANHOOK    Enterobacter cloacae complex NOT DETECTED NOT DETECTED Final   Escherichia coli DETECTED (A) NOT DETECTED Final    Comment: CRITICAL RESULT CALLED TO, READ BACK BY AND VERIFIED WITH: B. FOLEY RN, AT 1739 04/14/18 BY D. VANHOOK    Klebsiella oxytoca NOT DETECTED NOT DETECTED Final   Klebsiella pneumoniae NOT DETECTED NOT DETECTED Final   Proteus species NOT DETECTED NOT DETECTED Final   Serratia marcescens NOT DETECTED NOT DETECTED Final   Carbapenem resistance NOT DETECTED NOT DETECTED Final   Haemophilus influenzae NOT DETECTED NOT DETECTED Final   Neisseria meningitidis NOT DETECTED NOT DETECTED Final   Pseudomonas aeruginosa NOT DETECTED NOT DETECTED Final   Candida albicans NOT DETECTED NOT DETECTED Final   Candida glabrata NOT DETECTED NOT DETECTED Final   Candida krusei NOT DETECTED NOT DETECTED Final   Candida parapsilosis NOT DETECTED NOT DETECTED Final   Candida tropicalis NOT DETECTED NOT DETECTED Final    Comment: Performed at [facility], [address] [place], [place] [address]  Culture, Urine     Status: Abnormal   Collection Time: 04/13/18  9:25 PM  Result Value Ref Range Status   Specimen Description   Final    URINE, CLEAN CATCH Performed at [facility], [address], [place], [place] [address]    Special Requests   Final    NONE Performed at [facility], [address], [place], [place] [address]    Culture >=100,000 COLONIES/mL ESCHERICHIA COLI (A)  Final   Report Status 04/16/2018 FINAL  Final   Organism ID, Bacteria ESCHERICHIA COLI (A)  Final      Susceptibility    Escherichia coli - MIC*    AMPICILLIN >=32 RESISTANT Resistant     CEFAZOLIN <=4 SENSITIVE Sensitive     CEFTRIAXONE <=1 SENSITIVE Sensitive     CIPROFLOXACIN >=4 RESISTANT Resistant     GENTAMICIN <=1 SENSITIVE Sensitive     IMIPENEM <=0.25 SENSITIVE Sensitive     NITROFURANTOIN <=16 SENSITIVE Sensitive     TRIMETH/SULFA >=320 RESISTANT Resistant     AMPICILLIN/SULBACTAM 16 INTERMEDIATE Intermediate     PIP/TAZO <=4 SENSITIVE Sensitive     Extended ESBL NEGATIVE Sensitive     * >=100,000 COLONIES/mL ESCHERICHIA COLI         Radiology Studies: No results found.      Scheduled Meds: . aspirin EC  81 mg Oral QHS  . enoxaparin (LOVENOX) injection  40 mg Subcutaneous Q24H  . escitalopram  10 mg Oral Q lunch  . gabapentin  300 mg Oral BID  . levothyroxine  125 mcg Oral QAC breakfast  . pantoprazole  40 mg Oral Q lunch  . simvastatin  40 mg Oral Q lunch  . traZODone  50 mg Oral QHS   Continuous Infusions: . sodium chloride 75 mL/hr at 04/17/18 0314  . cefTRIAXone (ROCEPHIN)  IV 200 mL/hr at 04/16/18 1638     LOS: 3 days    Time spent: 25 minutes.     Chaya Jan, MD [facility] Pager [phone]  If 7PM-7AM, please contact night-coverage [email or web address] Password [facility] 04/17/2018, 2:07 PM

## 2018-04-17 NOTE — Progress Notes (Signed)
3 Days Post-Op Subjective: Patient reports that she is feeling fine.  No significant flank pain.  Objective: Vital signs in last 24 hours: Temp:  [98 F (36.7 C)-102.6 F (39.2 C)] 100.7 F (38.2 C) (09/24 1126) Pulse Rate:  [71-92] 92 (09/24 0527) Resp:  [18-22] 22 (09/24 0527) BP: (111-148)/(52-75) 136/75 (09/24 0527) SpO2:  [94 %-100 %] 94 % (09/24 0527)  Intake/Output from previous day: 09/23 0701 - 09/24 0700 In: 3207.7 [P.O.:480; I.V.:2405.3; IV Piggyback:322.5] Out: 2650 [Urine:2650] Intake/Output this shift: Total I/O In: 240 [P.O.:240] Out: -   Physical Exam:  Constitutional: Vital signs reviewed. WD WN in NAD   Eyes: PERRL, No scleral icterus.   Cardiovascular: Regular rate Pulmonary/Chest: Normal effort Extremities: No cyanosis or edema   Lab Results: Recent Labs    04/16/18 0547 04/17/18 0536  HGB 10.6* 10.7*  HCT 33.6* 33.1*   BMET Recent Labs    04/16/18 0547 04/17/18 0536  NA 139 142  K 3.6 3.3*  CL 110 114*  CO2 23 22  GLUCOSE 105* 114*  BUN 10 8  CREATININE 0.82 0.70  CALCIUM 8.2* 8.0*   No results for input(s): LABPT, INR in the last 72 hours. No results for input(s): LABURIN in the last 72 hours. Results for orders placed or performed during the hospital encounter of 04/13/18  Blood Culture (routine x 2)     Status: Abnormal   Collection Time: 04/13/18  8:50 PM  Result Value Ref Range Status   Specimen Description   Final    RIGHT ANTECUBITAL Performed at [facility], [address], [place], [place] [address]    Special Requests   Final    BOTTLES DRAWN AEROBIC AND ANAEROBIC Blood Culture adequate volume Performed at [facility], [address], [place], [place] [address]    Culture  Setup Time   Final    GRAM NEGATIVE RODS ANAEROBIC Gram Stain Report Called to,Read Back By and Verified With: FOLEY @ 1037 ON [id number] BY HENDERSON L. IN BOTH AEROBIC AND ANAEROBIC BOTTLES    Culture (A)  Final    ESCHERICHIA  COLI SUSCEPTIBILITIES PERFORMED ON PREVIOUS CULTURE WITHIN THE LAST 5 DAYS. Performed at [facility], [address] [place], [place] [address]    Report Status 04/16/2018 FINAL  Final  Blood Culture (routine x 2)     Status: Abnormal   Collection Time: 04/13/18  9:15 PM  Result Value Ref Range Status   Specimen Description   Final    BLOOD LEFT ARM Performed at [facility], [address], [place], [place] [address]    Special Requests   Final    BOTTLES DRAWN AEROBIC AND ANAEROBIC Blood Culture adequate volume Performed at [facility], [address], [place], [place] [address]    Culture  Setup Time   Final    GRAM NEGATIVE RODS ANAEROBIC Gram Stain Report Called to,Read Back By and Verified With: FOLEY @ 1037 ON [id number] BY HENDERSON L. IN BOTH AEROBIC AND ANAEROBIC BOTTLES    Culture ESCHERICHIA COLI (A)  Final   Report Status 04/16/2018 FINAL  Final   Organism ID, Bacteria ESCHERICHIA COLI  Final      Susceptibility   Escherichia coli - MIC*    AMPICILLIN >=32 RESISTANT Resistant     CEFAZOLIN <=4 SENSITIVE Sensitive     CEFEPIME <=1 SENSITIVE Sensitive     CEFTAZIDIME <=1 SENSITIVE Sensitive     CEFTRIAXONE <=1 SENSITIVE Sensitive     CIPROFLOXACIN >=4 RESISTANT  Resistant     GENTAMICIN <=1 SENSITIVE Sensitive     IMIPENEM <=0.25 SENSITIVE Sensitive     TRIMETH/SULFA >=320 RESISTANT Resistant     AMPICILLIN/SULBACTAM >=32 RESISTANT Resistant     PIP/TAZO <=4 SENSITIVE Sensitive     Extended ESBL NEGATIVE Sensitive     * ESCHERICHIA COLI  Blood Culture ID Panel (Reflexed)     Status: Abnormal   Collection Time: 04/13/18  9:15 PM  Result Value Ref Range Status   Enterococcus species NOT DETECTED NOT DETECTED Final   Listeria monocytogenes NOT DETECTED NOT DETECTED Final   Staphylococcus species NOT DETECTED NOT DETECTED Final   Staphylococcus aureus NOT DETECTED NOT DETECTED Final   Streptococcus species NOT DETECTED NOT DETECTED Final   Streptococcus  agalactiae NOT DETECTED NOT DETECTED Final   Streptococcus pneumoniae NOT DETECTED NOT DETECTED Final   Streptococcus pyogenes NOT DETECTED NOT DETECTED Final   Acinetobacter baumannii NOT DETECTED NOT DETECTED Final   Enterobacteriaceae species DETECTED (A) NOT DETECTED Final    Comment: Enterobacteriaceae represent a large family of gram-negative bacteria, not a single organism. CRITICAL RESULT CALLED TO, READ BACK BY AND VERIFIED WITH: B. FOLEY RN, AT 1739 04/14/18 BY D. VANHOOK    Enterobacter cloacae complex NOT DETECTED NOT DETECTED Final   Escherichia coli DETECTED (A) NOT DETECTED Final    Comment: CRITICAL RESULT CALLED TO, READ BACK BY AND VERIFIED WITH: B. FOLEY RN, AT 1739 04/14/18 BY D. VANHOOK    Klebsiella oxytoca NOT DETECTED NOT DETECTED Final   Klebsiella pneumoniae NOT DETECTED NOT DETECTED Final   Proteus species NOT DETECTED NOT DETECTED Final   Serratia marcescens NOT DETECTED NOT DETECTED Final   Carbapenem resistance NOT DETECTED NOT DETECTED Final   Haemophilus influenzae NOT DETECTED NOT DETECTED Final   Neisseria meningitidis NOT DETECTED NOT DETECTED Final   Pseudomonas aeruginosa NOT DETECTED NOT DETECTED Final   Candida albicans NOT DETECTED NOT DETECTED Final   Candida glabrata NOT DETECTED NOT DETECTED Final   Candida krusei NOT DETECTED NOT DETECTED Final   Candida parapsilosis NOT DETECTED NOT DETECTED Final   Candida tropicalis NOT DETECTED NOT DETECTED Final    Comment: Performed at [facility], [address], [place], [place] [address]  Culture, Urine     Status: Abnormal   Collection Time: 04/13/18  9:25 PM  Result Value Ref Range Status   Specimen Description   Final    URINE, CLEAN CATCH Performed at [facility], [address] [place], [place] [address]    Special Requests   Final    NONE Performed at [facility], [address] [place], [place] [address]    Culture >=100,000 COLONIES/mL ESCHERICHIA COLI (A)  Final   Report  Status 04/16/2018 FINAL  Final   Organism ID, Bacteria ESCHERICHIA COLI (A)  Final      Susceptibility   Escherichia coli - MIC*    AMPICILLIN >=32 RESISTANT Resistant     CEFAZOLIN <=4 SENSITIVE Sensitive     CEFTRIAXONE <=1 SENSITIVE Sensitive     CIPROFLOXACIN >=4 RESISTANT Resistant     GENTAMICIN <=1 SENSITIVE Sensitive     IMIPENEM <=0.25 SENSITIVE Sensitive     NITROFURANTOIN <=16 SENSITIVE Sensitive     TRIMETH/SULFA >=320 RESISTANT Resistant     AMPICILLIN/SULBACTAM 16 INTERMEDIATE Intermediate     PIP/TAZO <=4 SENSITIVE Sensitive     Extended ESBL NEGATIVE Sensitive     * >=100,000 COLONIES/mL ESCHERICHIA COLI    Studies/Results: No results  found.  Assessment/Plan:   Postoperative day #2 urgent right ureteral stenting for infected, obstructing large right renal pelvis stone.  She is doing well.  Treated for positive blood cultures and UTI.    I am fine with letting her go home at your leisure.  I do not think she needs to go home with a catheter and have ordered this to be removed.    We will call to set up an appointment for her follow-up   LOS: 3 days   Chelsea AusStephen M Darneisha Windhorst 04/17/2018, 12:55 PM

## 2018-04-17 NOTE — Progress Notes (Signed)
Foley catheter discontinued. Patient tolerated well with no complaints.

## 2018-04-18 DIAGNOSIS — A4150 Gram-negative sepsis, unspecified: Principal | ICD-10-CM

## 2018-04-18 DIAGNOSIS — N390 Urinary tract infection, site not specified: Secondary | ICD-10-CM

## 2018-04-18 DIAGNOSIS — A419 Sepsis, unspecified organism: Secondary | ICD-10-CM

## 2018-04-18 DIAGNOSIS — N200 Calculus of kidney: Secondary | ICD-10-CM

## 2018-04-18 DIAGNOSIS — E039 Hypothyroidism, unspecified: Secondary | ICD-10-CM

## 2018-04-18 DIAGNOSIS — I10 Essential (primary) hypertension: Secondary | ICD-10-CM

## 2018-04-18 LAB — CBC
HCT: 32.9 % — ABNORMAL LOW (ref 36.0–46.0)
HEMOGLOBIN: 10.7 g/dL — AB (ref 12.0–15.0)
MCH: 27.6 pg (ref 26.0–34.0)
MCHC: 32.5 g/dL (ref 30.0–36.0)
MCV: 84.8 fL (ref 78.0–100.0)
Platelets: 186 10*3/uL (ref 150–400)
RBC: 3.88 MIL/uL (ref 3.87–5.11)
RDW: 14.4 % (ref 11.5–15.5)
WBC: 4.2 10*3/uL (ref 4.0–10.5)

## 2018-04-18 LAB — BASIC METABOLIC PANEL
ANION GAP: 8 (ref 5–15)
BUN: 7 mg/dL — ABNORMAL LOW (ref 8–23)
CHLORIDE: 110 mmol/L (ref 98–111)
CO2: 24 mmol/L (ref 22–32)
Calcium: 8.3 mg/dL — ABNORMAL LOW (ref 8.9–10.3)
Creatinine, Ser: 0.65 mg/dL (ref 0.44–1.00)
GFR calc Af Amer: >60 mL/min (ref >60)
Glucose, Bld: 94 mg/dL (ref 70–99)
Potassium: 3.1 mmol/L — ABNORMAL LOW (ref 3.5–5.1)
SODIUM: 142 mmol/L (ref 135–145)

## 2018-04-18 MED ORDER — POTASSIUM CHLORIDE CRYS ER 20 MEQ PO TBCR
40.0000 meq | EXTENDED_RELEASE_TABLET | Freq: Once | ORAL | Status: AC
  Administered 2018-04-18: 40 meq via ORAL
  Filled 2018-04-18: qty 2

## 2018-04-18 NOTE — Evaluation (Signed)
Physical Therapy Evaluation Patient Details Name: SHARNIECE GIBBON MRN: [id number] DOB: [birth year] Today's Date: 04/18/2018   History of Present Illness  Smt Lokey  is a 72 y.o. female, with history of COPD, hypertension, CAD, GERD came to hospital with fever.  She denies chest pain or shortness of breath.  Complains of pain during urination.  Also had abdominal pain right upper quadrant.  In the ED CT scan of the abdomen pelvis showed large right renal pelvis/right UPJ stone with mild right hydronephrosis.  Patient started on IV ciprofloxacin and Flagyl.  Urology was consulted and patient underwent cystoscopy with right double-J stent placement.    Clinical Impression  Patient functioning at baseline for functional mobility and gait other than limited for ambulation due to c/o fatigue, no loss of balance, no c/o pain during bed mobility and when walking.  Plan:  Patient discharged from physical therapy to care of nursing for ambulation daily as tolerated for length of stay.    Follow Up Recommendations Home health PT;Supervision - Intermittent    Equipment Recommendations  None recommended by PT    Recommendations for Other Services       Precautions / Restrictions Precautions Precautions: None Restrictions Weight Bearing Restrictions: No      Mobility  Bed Mobility Overal bed mobility: Modified Independent             General bed mobility comments: increased time  Transfers Overall transfer level: Needs assistance Equipment used: Rolling walker (2 wheeled) Transfers: Sit to/from Stand;Stand Pivot Transfers Sit to Stand: Supervision Stand pivot transfers: Supervision       General transfer comment: slightly labored movement  Ambulation/Gait Ambulation/Gait assistance: Supervision Gait Distance (Feet): 75 Feet Assistive device: Rolling walker (2 wheeled) Gait Pattern/deviations: Decreased step length - right;Decreased step length - left;Decreased stride  length Gait velocity: decreased   General Gait Details: slightly labored cadence without loss of balance, limited secondary to fatigue  Stairs            Wheelchair Mobility    Modified Rankin (Stroke Patients Only)       Balance Overall balance assessment: Needs assistance Sitting-balance support: Feet supported;No upper extremity supported Sitting balance-Leahy Scale: Good     Standing balance support: Bilateral upper extremity supported;During functional activity Standing balance-Leahy Scale: Fair                               Pertinent Vitals/Pain Pain Assessment: No/denies pain    Home Living Family/patient expects to be discharged to:: Private residence Living Arrangements: Spouse/significant other Available Help at Discharge: Family Type of Home: House Home Access: Ramped entrance     Home Layout: One level Home Equipment: Environmental consultant - 2 wheels;Walker - 4 wheels;Shower seat;Bedside commode;Wheelchair - manual;Cane - single point      Prior Function Level of Independence: Independent with assistive device(s)         Comments: household and short distanced Therapist, sports        Extremity/Trunk Assessment   Upper Extremity Assessment Upper Extremity Assessment: Generalized weakness    Lower Extremity Assessment Lower Extremity Assessment: Generalized weakness    Cervical / Trunk Assessment Cervical / Trunk Assessment: Normal  Communication   Communication: No difficulties  Cognition Arousal/Alertness: Awake/alert Behavior During Therapy: WFL for tasks assessed/performed Overall Cognitive Status: Within Functional Limits for tasks assessed  General Comments      Exercises     Assessment/Plan    PT Assessment All further PT needs can be met in the next venue of care  PT Problem List Decreased strength;Decreased balance;Decreased  mobility       PT Treatment Interventions      PT Goals (Current goals can be found in the Care Plan section)  Acute Rehab PT Goals Patient Stated Goal: return home with spouse to assist PT Goal Formulation: With patient Time For Goal Achievement: 04/18/18 Potential to Achieve Goals: Good    Frequency     Barriers to discharge        Co-evaluation               AM-PAC PT "6 Clicks" Daily Activity  Outcome Measure Difficulty turning over in bed (including adjusting bedclothes, sheets and blankets)?: None Difficulty moving from lying on back to sitting on the side of the bed? : None Difficulty sitting down on and standing up from a chair with arms (e.g., wheelchair, bedside commode, etc,.)?: None Help needed moving to and from a bed to chair (including a wheelchair)?: None Help needed walking in hospital room?: A Little Help needed climbing 3-5 steps with a railing? : A Little 6 Click Score: 22    End of Session   Activity Tolerance: Patient tolerated treatment well;Patient limited by fatigue Patient left: in bed;with call bell/phone within reach(seated at bedside to eat lunch) Nurse Communication: Mobility status PT Visit Diagnosis: Unsteadiness on feet (R26.81);Other abnormalities of gait and mobility (R26.89);Muscle weakness (generalized) (M62.81)    Time: 6754-4920 PT Time Calculation (min) (ACUTE ONLY): 23 min   Charges:   PT Evaluation $PT Eval Moderate Complexity: 1 Mod PT Treatments $Therapeutic Activity: 23-37 mins        12:12 PM, 04/18/18 Lonell Grandchild, MPT Physical Therapist with [facility] [phone] office [phone] mobile phone

## 2018-04-18 NOTE — Progress Notes (Addendum)
PROGRESS NOTE    Andrea Norton  WUJ:[id number] DOB: 1945-09-27 DOA: 04/13/2018 PCP: Joette Catching, MD     Brief Narrative:  72 year old woman admitted from home on 9/20 due to fever of 104.4 and encephalopathy. On CT scan she was found to have a large right renal pelvis/right UPJ stone with right-sided hydronephrosis as well as sepsis due to UTI.  That evening she emergently went to the OR to have a double-J stent placement by urology.  Subsequent blood cultures were growing gram-negative rods with BC ID consistent with E. coli.  Patient was initially admitted on Cipro, on 9/22 antibiotics were transitioned over to meropenem pending culture data given significant ESBL E. coli at [facility].  Subsequently blood and urine cultures grew E. coli that was resistant to Cipro but sensitive to meropenem and ceftriaxone.  Antibiotics were de-escalated to ceftriaxone on 9/23.  Today, pt reported feeling much better, still mildly tender around her R flank. Last temp spike was yesterday am. Tolerating orally, denies any N/V, chest pain, SOB, diarrhea.  Assessment & Plan:   Principal Problem:   Sepsis secondary to UTI [facility]) Active Problems:   Hypothyroidism   Hyperlipidemia   OBESITY, MORBID   Essential hypertension   Coronary atherosclerosis   GASTROESOPHAGEAL REFLUX DISEASE   Gram negative sepsis (HCC)   Sepsis/bacteremia secondary to obstructing large infected renal stone-->E.coli UTI Currently afebrile, with no leukocytosis UC and BC grew E.coli Repeat BC X 2 pending to ensure clearance Status post double-J stent placement by urology Continue Rocephin for, and plan to d/c home with oral AB for about 10 days Follow up with urology for definitive stone removal and follow up  Hypokalemia Replace PRN  Normocytic anemia Likely due to dilutional due to significant IVF given No signs of bleeding Daily CBC  Elevated troponin Likely 2/2 demand ischemia due to sepsis Troponins have  remained flat, no chest pain, no acute EKG changes. 2D echo: Ejection fraction of 50 to 55% with normal wall motion and normal LV diastolic function parameters  Hypothyroidism  Continue Synthroid  COPD Stable, no exacerbation  Morbid obesity Advised lifestyle modification   DVT prophylaxis: Lovenox Code Status: Full code Family Communication: None at bedside Disposition Plan: Plan to d/c on 04/19/18 after repeat [facility] has been resulted Consultants:   Urology  Procedures:   Double-J stent placement on 9/21  Antimicrobials:  Anti-infectives (From admission, onward)   Start     Dose/Rate Route Frequency Ordered Stop   04/16/18 1600  cefTRIAXone (ROCEPHIN) 2 g in sodium chloride 0.9 % 100 mL IVPB     2 g 200 mL/hr over 30 Minutes Intravenous Every 24 hours 04/16/18 1504     04/15/18 0900  meropenem (MERREM) 1 g in sodium chloride 0.9 % 100 mL IVPB  Status:  Discontinued     1 g 200 mL/hr over 30 Minutes Intravenous Every 8 hours 04/15/18 0845 04/16/18 1504   04/14/18 1000  ciprofloxacin (CIPRO) IVPB 200 mg  Status:  Discontinued     200 mg 100 mL/hr over 60 Minutes Intravenous Every 12 hours 04/14/18 0746 04/15/18 0815   04/13/18 2200  ciprofloxacin (CIPRO) IVPB 400 mg     400 mg 200 mL/hr over 60 Minutes Intravenous  Once 04/13/18 2148 04/13/18 2350   04/13/18 2200  metroNIDAZOLE (FLAGYL) IVPB 500 mg  Status:  Discontinued     500 mg 100 mL/hr over 60 Minutes Intravenous Every 8 hours 04/13/18 2148 04/14/18 0330  Subjective: Today, pt reported feeling much better, still mildly tender around her R flank. Last temp spike was yesterday am. Tolerating orally, denies any N/V, chest pain, SOB, diarrhea.   Objective: Vitals:   04/17/18 1458 04/17/18 2053 04/17/18 2241 04/18/18 0546  BP: 138/86  (!) 149/91 (!) 121/91  Pulse: 82  80 66  Resp: 18  20 18   Temp: 99.3 F (37.4 C)  100.2 F (37.9 C) 98 F (36.7 C)  TempSrc: Oral  Oral Oral  SpO2: 93% 98% 96% 94%    Weight:      Height:        Intake/Output Summary (Last 24 hours) at 04/18/2018 1136 Last data filed at 04/18/2018 0900 Gross per 24 hour  Intake 840 ml  Output 1100 ml  Net -260 ml   Filed Weights   04/13/18 2045  Weight: 113.4 kg    Examination:  General exam: NAD Respiratory system: CTAB Cardiovascular system: S1, S2 present Gastrointestinal system: Abdomen is nondistended, soft, Mildly TTP around R flank, BS present Extremities: No pedal edema noted, +pedal pulses Skin: No rashes, lesions or ulcers Psychiatry: Normal mood        Data Reviewed: I have personally reviewed following labs and imaging studies  CBC: Recent Labs  Lab 04/13/18 2050 04/14/18 0408 04/16/18 0547 04/17/18 0536 04/18/18 0503  WBC 9.6 14.4* 6.2 4.0 4.2  NEUTROABS 8.5*  --   --   --   --   HGB 12.6 11.5* 10.6* 10.7* 10.7*  HCT 38.5 35.6* 33.6* 33.1* 32.9*  MCV 84.8 85.2 86.6 85.5 84.8  PLT 180 172 164 175 186   Basic Metabolic Panel: Recent Labs  Lab 04/13/18 2050 04/14/18 0305 04/16/18 0547 04/17/18 0536 04/18/18 0503  NA 138  --  139 142 142  K 3.1*  --  3.6 3.3* 3.1*  CL 107  --  110 114* 110  CO2 22  --  23 22 24   GLUCOSE 133*  --  105* 114* 94  BUN 13  --  10 8 7*  CREATININE 0.87 0.80 0.82 0.70 0.65  CALCIUM 8.4*  --  8.2* 8.0* 8.3*   GFR: Estimated Creatinine Clearance: 82.9 mL/min (by C-G formula based on SCr of 0.65 mg/dL). Liver Function Tests: Recent Labs  Lab 04/13/18 2050  AST 23  ALT 15  ALKPHOS 97  BILITOT 2.3*  PROT 7.0  ALBUMIN 3.3*   Recent Labs  Lab 04/13/18 2050  LIPASE 23   No results for input(s): AMMONIA in the last 168 hours. Coagulation Profile: No results for input(s): INR, PROTIME in the last 168 hours. Cardiac Enzymes: Recent Labs  Lab 04/13/18 2050 04/14/18 0305 04/14/18 0849 04/14/18 1451  TROPONINI 0.16* 0.14* 0.16* 0.14*   BNP (last 3 results) No results for input(s): PROBNP in the last 8760 hours. HbA1C: No results  for input(s): HGBA1C in the last 72 hours. CBG: No results for input(s): GLUCAP in the last 168 hours. Lipid Profile: No results for input(s): CHOL, HDL, LDLCALC, TRIG, CHOLHDL, LDLDIRECT in the last 72 hours. Thyroid Function Tests: No results for input(s): TSH, T4TOTAL, FREET4, T3FREE, THYROIDAB in the last 72 hours. Anemia Panel: No results for input(s): VITAMINB12, FOLATE, FERRITIN, TIBC, IRON, RETICCTPCT in the last 72 hours. Urine analysis:    Component Value Date/Time   COLORURINE YELLOW 04/13/2018 2120   APPEARANCEUR HAZY (A) 04/13/2018 2120   LABSPEC 1.010 04/13/2018 2120   PHURINE 8.0 04/13/2018 2120   GLUCOSEU NEGATIVE 04/13/2018 2120   HGBUR MODERATE (  A) 04/13/2018 2120   BILIRUBINUR NEGATIVE 04/13/2018 2120   KETONESUR NEGATIVE 04/13/2018 2120   PROTEINUR 30 (A) 04/13/2018 2120   UROBILINOGEN 1.0 03/27/2014 1336   NITRITE NEGATIVE 04/13/2018 2120   LEUKOCYTESUR MODERATE (A) 04/13/2018 2120   Sepsis Labs: @LABRCNTIP (procalcitonin:4,lacticidven:4)  ) Recent Results (from the past 240 hour(s))  Blood Culture (routine x 2)     Status: Abnormal   Collection Time: 04/13/18  8:50 PM  Result Value Ref Range Status   Specimen Description   Final    RIGHT ANTECUBITAL Performed at [facility], [address] [place], [place] [address]    Special Requests   Final    BOTTLES DRAWN AEROBIC AND ANAEROBIC Blood Culture adequate volume Performed at [facility], [address], [place], [place] [address]    Culture  Setup Time   Final    GRAM NEGATIVE RODS ANAEROBIC Gram Stain Report Called to,Read Back By and Verified With: FOLEY @ 1037 ON [id number] BY HENDERSON L. IN BOTH AEROBIC AND ANAEROBIC BOTTLES    Culture (A)  Final    ESCHERICHIA COLI SUSCEPTIBILITIES PERFORMED ON PREVIOUS CULTURE WITHIN THE LAST 5 DAYS. Performed at [facility], [address], [place], [place] [address]    Report Status 04/16/2018 FINAL  Final  Blood Culture (routine x 2)     Status:  Abnormal   Collection Time: 04/13/18  9:15 PM  Result Value Ref Range Status   Specimen Description   Final    BLOOD LEFT ARM Performed at [facility], [address], [place], [place] [address]    Special Requests   Final    BOTTLES DRAWN AEROBIC AND ANAEROBIC Blood Culture adequate volume Performed at [facility], [address] [place], [place] [address]    Culture  Setup Time   Final    GRAM NEGATIVE RODS ANAEROBIC Gram Stain Report Called to,Read Back By and Verified With: FOLEY @ 1037 ON [id number] BY HENDERSON L. IN BOTH AEROBIC AND ANAEROBIC BOTTLES    Culture ESCHERICHIA COLI (A)  Final   Report Status 04/16/2018 FINAL  Final   Organism ID, Bacteria ESCHERICHIA COLI  Final      Susceptibility   Escherichia coli - MIC*    AMPICILLIN >=32 RESISTANT Resistant     CEFAZOLIN <=4 SENSITIVE Sensitive     CEFEPIME <=1 SENSITIVE Sensitive     CEFTAZIDIME <=1 SENSITIVE Sensitive     CEFTRIAXONE <=1 SENSITIVE Sensitive     CIPROFLOXACIN >=4 RESISTANT Resistant     GENTAMICIN <=1 SENSITIVE Sensitive     IMIPENEM <=0.25 SENSITIVE Sensitive     TRIMETH/SULFA >=320 RESISTANT Resistant     AMPICILLIN/SULBACTAM >=32 RESISTANT Resistant     PIP/TAZO <=4 SENSITIVE Sensitive     Extended ESBL NEGATIVE Sensitive     * ESCHERICHIA COLI  Blood Culture ID Panel (Reflexed)     Status: Abnormal   Collection Time: 04/13/18  9:15 PM  Result Value Ref Range Status   Enterococcus species NOT DETECTED NOT DETECTED Final   Listeria monocytogenes NOT DETECTED NOT DETECTED Final   Staphylococcus species NOT DETECTED NOT DETECTED Final   Staphylococcus aureus NOT DETECTED NOT DETECTED Final   Streptococcus species NOT DETECTED NOT DETECTED Final   Streptococcus agalactiae NOT DETECTED NOT DETECTED Final   Streptococcus pneumoniae NOT DETECTED NOT DETECTED Final   Streptococcus pyogenes NOT DETECTED NOT DETECTED Final   Acinetobacter baumannii NOT DETECTED NOT DETECTED Final   Enterobacteriaceae  species DETECTED (A) NOT DETECTED Final  Comment: Enterobacteriaceae represent a large family of gram-negative bacteria, not a single organism. CRITICAL RESULT CALLED TO, READ BACK BY AND VERIFIED WITH: B. FOLEY RN, AT 1739 04/14/18 BY D. VANHOOK    Enterobacter cloacae complex NOT DETECTED NOT DETECTED Final   Escherichia coli DETECTED (A) NOT DETECTED Final    Comment: CRITICAL RESULT CALLED TO, READ BACK BY AND VERIFIED WITH: B. FOLEY RN, AT 1739 04/14/18 BY D. VANHOOK    Klebsiella oxytoca NOT DETECTED NOT DETECTED Final   Klebsiella pneumoniae NOT DETECTED NOT DETECTED Final   Proteus species NOT DETECTED NOT DETECTED Final   Serratia marcescens NOT DETECTED NOT DETECTED Final   Carbapenem resistance NOT DETECTED NOT DETECTED Final   Haemophilus influenzae NOT DETECTED NOT DETECTED Final   Neisseria meningitidis NOT DETECTED NOT DETECTED Final   Pseudomonas aeruginosa NOT DETECTED NOT DETECTED Final   Candida albicans NOT DETECTED NOT DETECTED Final   Candida glabrata NOT DETECTED NOT DETECTED Final   Candida krusei NOT DETECTED NOT DETECTED Final   Candida parapsilosis NOT DETECTED NOT DETECTED Final   Candida tropicalis NOT DETECTED NOT DETECTED Final    Comment: Performed at [facility], [address] [place], [place] [address]  Culture, Urine     Status: Abnormal   Collection Time: 04/13/18  9:25 PM  Result Value Ref Range Status   Specimen Description   Final    URINE, CLEAN CATCH Performed at [facility], [address] [place], [place] [address]    Special Requests   Final    NONE Performed at [facility], [address] [place], [place] [address]    Culture >=100,000 COLONIES/mL ESCHERICHIA COLI (A)  Final   Report Status 04/16/2018 FINAL  Final   Organism ID, Bacteria ESCHERICHIA COLI (A)  Final      Susceptibility   Escherichia coli - MIC*    AMPICILLIN >=32 RESISTANT Resistant     CEFAZOLIN <=4 SENSITIVE Sensitive     CEFTRIAXONE <=1 SENSITIVE  Sensitive     CIPROFLOXACIN >=4 RESISTANT Resistant     GENTAMICIN <=1 SENSITIVE Sensitive     IMIPENEM <=0.25 SENSITIVE Sensitive     NITROFURANTOIN <=16 SENSITIVE Sensitive     TRIMETH/SULFA >=320 RESISTANT Resistant     AMPICILLIN/SULBACTAM 16 INTERMEDIATE Intermediate     PIP/TAZO <=4 SENSITIVE Sensitive     Extended ESBL NEGATIVE Sensitive     * >=100,000 COLONIES/mL ESCHERICHIA COLI  Culture, blood (routine x 2)     Status: None (Preliminary result)   Collection Time: 04/18/18  7:33 AM  Result Value Ref Range Status   Specimen Description BLOOD LEFT ARM  Final   Special Requests   Final    BOTTLES DRAWN AEROBIC AND ANAEROBIC Blood Culture adequate volume Performed at [facility], [address] [place], [place] [address]    Culture PENDING  Incomplete   Report Status PENDING  Incomplete  Culture, blood (routine x 2)     Status: None (Preliminary result)   Collection Time: 04/18/18  7:42 AM  Result Value Ref Range Status   Specimen Description BLOOD RIGHT HAND  Final   Special Requests   Final    BOTTLES DRAWN AEROBIC ONLY Blood Culture adequate volume Performed at [facility], [address] [place], [place] [address]    Culture PENDING  Incomplete   Report Status PENDING  Incomplete         Radiology Studies: No results found.      Scheduled Meds: .  aspirin EC  81 mg Oral QHS  . enoxaparin (LOVENOX) injection  40 mg Subcutaneous Q24H  . escitalopram  10 mg Oral Q lunch  . gabapentin  300 mg Oral BID  . levothyroxine  125 mcg Oral QAC breakfast  . pantoprazole  40 mg Oral Q lunch  . simvastatin  40 mg Oral Q lunch  . traZODone  50 mg Oral QHS   Continuous Infusions: . cefTRIAXone (ROCEPHIN)  IV 2 g (04/17/18 1820)     LOS: 4 days    Time spent: 30 minutes.     Briant CedarNkeiruka J Ezenduka, MD [facility]   If 7PM-7AM, please contact night-coverage [email or web address]  04/18/2018, 11:36 AM

## 2018-04-19 MED ORDER — POTASSIUM CHLORIDE CRYS ER 20 MEQ PO TBCR
40.0000 meq | EXTENDED_RELEASE_TABLET | Freq: Once | ORAL | Status: AC
  Administered 2018-04-19: 40 meq via ORAL
  Filled 2018-04-19: qty 2

## 2018-04-19 MED ORDER — CEFPODOXIME PROXETIL 200 MG PO TABS: 200.0000 mg | ORAL_TABLET | Freq: Two times a day (BID) | ORAL | 0 refills | Status: AC

## 2018-04-19 NOTE — Care Management Note (Signed)
Case Management Note  Patient Details  Name: Andrea Norton MRN: [id number] Date of Birth: 19-Jan-1946  Subjective/Objective:       Admitted with sepsis. Pt from home, lives with family, very ind pta. She has insurance with drug coverage, PCP and transportation. She has no concerns about needs at DC. She has had HH multiple times in the past by different agencies. HH PT is recommended, pt declines, saying she knows what to do and will do it. She has no DME needs.              Action/Plan: DC home today with self care.   Expected Discharge Date:  04/19/18               Expected Discharge Plan:  Home/Self Care  In-House Referral:  NA  Discharge planning Services  CM Consult  Post Acute Care Choice:  NA Choice offered to:  NA  Status of Service:  Completed, signed off  If discussed at Long Length of Stay Meetings, dates discussed:  04/19/18  Additional Comments:  Malcolm Metro, RN 04/19/2018, 11:38 AM

## 2018-04-19 NOTE — Progress Notes (Signed)
IV discontinued,catheter intact. Discharge instructions given on medications and follow up visits,patient verbalized understanding. Prescriptions sent to Pharmacy of choice documented on AVS. Accompanied by staff to an awaiting vehicle. 

## 2018-04-19 NOTE — Care Management Important Message (Signed)
Important Message  Patient Details  Name: [name] MRN: [id number] Date of Birth: [birth year]   Medicare Important Message Given:  Yes    Malcolm Metro, RN 04/19/2018, 11:37 AM

## 2018-04-19 NOTE — Discharge Summary (Signed)
Discharge Summary  Andrea Norton ZOX:[id number] DOB: 22-Jan-1946  PCP: Joette Catching, MD  Admit date: 04/13/2018 Discharge date: 04/19/2018  Time spent: 40 mins   Recommendations for Outpatient Follow-up:  1. PCP 2. Urology   Discharge Diagnoses:  Active Hospital Problems   Diagnosis Date Noted  . Sepsis secondary to UTI (HCC) 04/14/2018  . Gram negative sepsis (HCC) 04/14/2018  . Hypothyroidism 01/06/2009  . Hyperlipidemia 01/06/2009  . OBESITY, MORBID 01/06/2009  . Essential hypertension 01/06/2009  . GASTROESOPHAGEAL REFLUX DISEASE 01/06/2009  . Coronary atherosclerosis 01/06/2009    Resolved Hospital Problems  No resolved problems to display.    Discharge Condition: Stable   Diet recommendation: Heart healthy  Vitals:   04/18/18 2218 04/19/18 0447  BP: (!) 147/80 (!) 159/86  Pulse: 74 83  Resp: 20 16  Temp: 98 F (36.7 C) 98.7 F (37.1 C)  SpO2: 97% 92%    History of present illness:  72 year old woman admitted from home on 9/20 due to fever of 104.4 and encephalopathy. On CT scan she was found to have a large right renal pelvis/right UPJ stone with right-sided hydronephrosis as well as sepsis due to UTI.  That evening she emergently went to the OR to have a double-J stent placement by urology.  Subsequent blood cultures were growing gram-negative rods with BC ID consistent with E. coli.  Patient was initially admitted on Cipro, on 9/22 antibiotics were transitioned over to meropenem pending culture data given significant ESBL E. coli at [facility].  Subsequently blood and urine cultures grew E. coli that was resistant to Cipro but sensitive to meropenem and ceftriaxone.  Antibiotics were de-escalated to ceftriaxone on 9/23.  Today, pt reported feeling much better, denies any new complaints. No fever for >24H. Pt advised to follow up with PCP and Urology. Pt stable for discharge.  Hospital Course:  Principal Problem:   Sepsis secondary to UTI [facility]) Active  Problems:   Hypothyroidism   Hyperlipidemia   OBESITY, MORBID   Essential hypertension   Coronary atherosclerosis   GASTROESOPHAGEAL REFLUX DISEASE   Gram negative sepsis (HCC)  Sepsis/bacteremia secondary to obstructing large infected renal stone-->E.coli UTI Currently afebrile, with no leukocytosis UC and BC grew E.coli Repeat BC X 2 NGTD Status post double-J stent placement by urology S/P IV Rocephin, d/c home on cefpodoxime for a total of 14 days (due to bacteremia, and presence of Large obstructing stone) Advised to follow up with urology for definitive stone removal and follow up  Hypokalemia Replaced PRN  Normocytic anemia Likely due to dilutional due to significant IVF given No signs of bleeding  Elevated troponin Likely 2/2 demand ischemia due to sepsis Troponins have remained flat, no chest pain, no acute EKG changes. 2D echo: Ejection fraction of 50 to 55% with normal wall motion and normal LV diastolic function parameters  Hypothyroidism  Continue Synthroid  COPD Stable, no exacerbation  Morbid obesity Advised lifestyle modification   Procedures:  Double-J stent placement on 9/21  Consultations:  Urology  Discharge Exam: BP (!) 159/86   Pulse 83   Temp 98.7 F (37.1 C) (Oral)   Resp 16   Ht 5' 5.5" (1.664 m)   Wt 113.4 kg   SpO2 92%   BMI 40.97 kg/m   General: NAD  Cardiovascular: S1, S2 present Respiratory: CTAB  Discharge Instructions You were cared for by a hospitalist during your hospital stay. If you have any questions about your discharge medications or the care you received while you were  in the hospital after you are discharged, you can call the unit and asked to speak with the hospitalist on call if the hospitalist that took care of you is not available. Once you are discharged, your primary care physician will handle any further medical issues. Please note that NO REFILLS for any discharge medications will be authorized once  you are discharged, as it is imperative that you return to your primary care physician (or establish a relationship with a primary care physician if you do not have one) for your aftercare needs so that they can reassess your need for medications and monitor your lab values.  Discharge Instructions    Diet - low sodium heart healthy   Complete by:  As directed    Increase activity slowly   Complete by:  As directed      Allergies as of 04/19/2018      Reactions   Morphine And Related Other (See Comments)   Hallunications, confusion for 1 month      Medication List    STOP taking these medications   naproxen sodium 220 MG tablet Commonly known as:  ALEVE     TAKE these medications   acetaminophen 325 MG tablet Commonly known as:  TYLENOL Take 650 mg by mouth every 6 (six) hours as needed (for headaches.).   aspirin EC 81 MG tablet Take 81 mg by mouth at bedtime.   cefpodoxime 200 MG tablet Commonly known as:  VANTIN Take 1 tablet (200 mg total) by mouth 2 (two) times daily for 9 days. Start taking on:  04/20/2018   cyanocobalamin 1000 MCG/ML injection Commonly known as:  (VITAMIN B-12) Inject 1,000 mcg into the muscle every 30 (thirty) days.   escitalopram 10 MG tablet Commonly known as:  LEXAPRO Take 10 mg by mouth daily with lunch.   gabapentin 100 MG capsule Commonly known as:  NEURONTIN Take 300 mg by mouth 2 (two) times daily. Morning & night   levothyroxine 125 MCG tablet Commonly known as:  SYNTHROID, LEVOTHROID Take 125 mcg by mouth daily after lunch. Brand Name Only Takes at lunchtime   meloxicam 7.5 MG tablet Commonly known as:  MOBIC Take 7.5 mg by mouth 2 (two) times daily. Morning & night   methocarbamol 500 MG tablet Commonly known as:  ROBAXIN Take 1 tablet (500 mg total) by mouth every 6 (six) hours as needed for muscle spasms. What changed:  when to take this   NONFORMULARY OR COMPOUNDED ITEM Apply 1 application topically 2 (two) times daily  as needed (for skin irritation/itching.). COMPOUNDED (TRI-MIX) TCA0.1/KETOCONAZOLE 2%/SSD 1%(30:30:45)   pantoprazole 40 MG tablet Commonly known as:  PROTONIX Take 40 mg by mouth daily with lunch.   simvastatin 40 MG tablet Commonly known as:  ZOCOR Take 40 mg by mouth daily with lunch.   Vitamin D (Ergocalciferol) 50000 units Caps capsule Commonly known as:  DRISDOL Take 50,000 Units by mouth every Friday.   Vitamin D3 2000 units Tabs Take 2,000 Units by mouth daily.      Allergies  Allergen Reactions  . Morphine And Related Other (See Comments)    Hallunications, confusion for 1 month   Follow-up Information    Marcine Matar, MD Follow up.   Specialty:  Urology Why:  We will call you to set up follow-up appointment here in [facility] information: [address] [place] [address] [phone]        Joette Catching, MD. Schedule an appointment as soon  as possible for a visit in 1 week(s).   Specialty:  Family Medicine Contact information: [address] [place] [address] [phone]            The results of significant diagnostics from this hospitalization (including imaging, microbiology, ancillary and laboratory) are listed below for reference.    Significant Diagnostic Studies: Dg Chest 1 View  Result Date: 04/13/2018 CLINICAL DATA:  Lethargy, flu like symptoms EXAM: CHEST  1 VIEW COMPARISON:  05/03/2016 FINDINGS: Mild cardiomegaly. Increased interstitial and hazy opacity at the bases. Aortic atherosclerosis. No pneumothorax. IMPRESSION: 1. Increased interstitial and hazy bibasilar opacity, could reflect edema or interstitial inflammatory process/viral process. 2. Cardiomegaly Electronically Signed   By: Jasmine Pang M.D.   On: 04/13/2018 22:11   Ct Abdomen Pelvis W Contrast  Result Date: 04/13/2018 CLINICAL DATA:  72 year old female with abdominal pain. EXAM: CT ABDOMEN AND PELVIS WITH CONTRAST TECHNIQUE: Multidetector CT  imaging of the abdomen and pelvis was performed using the standard protocol following bolus administration of intravenous contrast. CONTRAST:  ISOVUE-300 IOPAMIDOL (ISOVUE-300) INJECTION 61% COMPARISON:  Abdominal CT dated 05/15/2017 FINDINGS: Lower chest: The visualized lung bases are clear. No intra-abdominal free air or free fluid. Hepatobiliary: Punctate focus of calcification in the right lobe of the liver, likely an old granuloma. The liver is otherwise unremarkable. No intrahepatic biliary ductal dilatation. The gallbladder is unremarkable. Pancreas: Unremarkable. No pancreatic ductal dilatation or surrounding inflammatory changes. Spleen: Normal in size without focal abnormality. Adrenals/Urinary Tract: The adrenal glands are unremarkable. There is a stone measuring 13 x 20 mm in the right renal pelvis with associated obstruction of the right UPJ and mild right hydronephrosis. A 3 mm nonobstructing right renal inferior pole calculus is also noted. There is right perinephric stranding. Correlation with urinalysis recommended to evaluate for UTI. The left kidney is unremarkable. The visualized ureters and urinary bladder appear unremarkable. Stomach/Bowel: Postsurgical changes of gastric sleeve. There is no bowel obstruction or active inflammation. The appendix is not visualized, likely surgically absent. Vascular/Lymphatic: Moderate aortoiliac atherosclerotic disease. No portal venous gas. There is no adenopathy. Reproductive: A 2 cm calcified anterior body fibroid. No adnexal masses. Other: Midline vertical anterior abdominal wall incisional scar and postsurgical changes of ventral hernia repair with mesh. Musculoskeletal: Extensive degenerative changes of the spine with multilevel disc desiccation and vacuum phenomena. Grade 1 L3-L4 anterolisthesis. No acute osseous pathology. IMPRESSION: 1. Large right renal pelvis/right UPJ stone with mild right hydronephrosis. Correlation with urinalysis  recommended to exclude superimposed UTI. 2.  Aortic Atherosclerosis (ICD10-I70.0). Electronically Signed   By: Elgie Collard M.D.   On: 04/13/2018 23:30   Dg C-arm 1-60 Min-no Report  Result Date: 04/14/2018 Fluoroscopy was utilized by the requesting physician.  No radiographic interpretation.    Microbiology: Recent Results (from the past 240 hour(s))  Blood Culture (routine x 2)     Status: Abnormal   Collection Time: 04/13/18  8:50 PM  Result Value Ref Range Status   Specimen Description   Final    RIGHT ANTECUBITAL Performed at [facility], [address] [place], [place] [address]    Special Requests   Final    BOTTLES DRAWN AEROBIC AND ANAEROBIC Blood Culture adequate volume Performed at [facility], [address] [place], [place] [address]    Culture  Setup Time   Final    GRAM NEGATIVE RODS ANAEROBIC Gram Stain Report Called to,Read Back By and Verified With: FOLEY @ 1037 ON [id number] BY HENDERSON L. IN BOTH AEROBIC  AND ANAEROBIC BOTTLES    Culture (A)  Final    ESCHERICHIA COLI SUSCEPTIBILITIES PERFORMED ON PREVIOUS CULTURE WITHIN THE LAST 5 DAYS. Performed at [facility], [address], [place], [place] [address]    Report Status 04/16/2018 FINAL  Final  Blood Culture (routine x 2)     Status: Abnormal   Collection Time: 04/13/18  9:15 PM  Result Value Ref Range Status   Specimen Description   Final    BLOOD LEFT ARM Performed at [facility], [address] [place], [place] [address]    Special Requests   Final    BOTTLES DRAWN AEROBIC AND ANAEROBIC Blood Culture adequate volume Performed at [facility], [address] [place], [place] [address]    Culture  Setup Time   Final    GRAM NEGATIVE RODS ANAEROBIC Gram Stain Report Called to,Read Back By and Verified With: FOLEY @ 1037 ON [id number] BY HENDERSON L. IN BOTH AEROBIC AND ANAEROBIC BOTTLES    Culture ESCHERICHIA COLI (A)  Final   Report Status 04/16/2018 FINAL  Final   Organism ID,  Bacteria ESCHERICHIA COLI  Final      Susceptibility   Escherichia coli - MIC*    AMPICILLIN >=32 RESISTANT Resistant     CEFAZOLIN <=4 SENSITIVE Sensitive     CEFEPIME <=1 SENSITIVE Sensitive     CEFTAZIDIME <=1 SENSITIVE Sensitive     CEFTRIAXONE <=1 SENSITIVE Sensitive     CIPROFLOXACIN >=4 RESISTANT Resistant     GENTAMICIN <=1 SENSITIVE Sensitive     IMIPENEM <=0.25 SENSITIVE Sensitive     TRIMETH/SULFA >=320 RESISTANT Resistant     AMPICILLIN/SULBACTAM >=32 RESISTANT Resistant     PIP/TAZO <=4 SENSITIVE Sensitive     Extended ESBL NEGATIVE Sensitive     * ESCHERICHIA COLI  Blood Culture ID Panel (Reflexed)     Status: Abnormal   Collection Time: 04/13/18  9:15 PM  Result Value Ref Range Status   Enterococcus species NOT DETECTED NOT DETECTED Final   Listeria monocytogenes NOT DETECTED NOT DETECTED Final   Staphylococcus species NOT DETECTED NOT DETECTED Final   Staphylococcus aureus NOT DETECTED NOT DETECTED Final   Streptococcus species NOT DETECTED NOT DETECTED Final   Streptococcus agalactiae NOT DETECTED NOT DETECTED Final   Streptococcus pneumoniae NOT DETECTED NOT DETECTED Final   Streptococcus pyogenes NOT DETECTED NOT DETECTED Final   Acinetobacter baumannii NOT DETECTED NOT DETECTED Final   Enterobacteriaceae species DETECTED (A) NOT DETECTED Final    Comment: Enterobacteriaceae represent a large family of gram-negative bacteria, not a single organism. CRITICAL RESULT CALLED TO, READ BACK BY AND VERIFIED WITH: B. FOLEY RN, AT 1739 04/14/18 BY D. VANHOOK    Enterobacter cloacae complex NOT DETECTED NOT DETECTED Final   Escherichia coli DETECTED (A) NOT DETECTED Final    Comment: CRITICAL RESULT CALLED TO, READ BACK BY AND VERIFIED WITH: B. FOLEY RN, AT 1739 04/14/18 BY D. VANHOOK    Klebsiella oxytoca NOT DETECTED NOT DETECTED Final   Klebsiella pneumoniae NOT DETECTED NOT DETECTED Final   Proteus species NOT DETECTED NOT DETECTED Final   Serratia marcescens NOT  DETECTED NOT DETECTED Final   Carbapenem resistance NOT DETECTED NOT DETECTED Final   Haemophilus influenzae NOT DETECTED NOT DETECTED Final   Neisseria meningitidis NOT DETECTED NOT DETECTED Final   Pseudomonas aeruginosa NOT DETECTED NOT DETECTED Final   Candida albicans NOT DETECTED NOT DETECTED Final   Candida glabrata NOT DETECTED NOT DETECTED Final   Candida krusei  NOT DETECTED NOT DETECTED Final   Candida parapsilosis NOT DETECTED NOT DETECTED Final   Candida tropicalis NOT DETECTED NOT DETECTED Final    Comment: Performed at [facility], [address] [place], [place] [address]  Culture, Urine     Status: Abnormal   Collection Time: 04/13/18  9:25 PM  Result Value Ref Range Status   Specimen Description   Final    URINE, CLEAN CATCH Performed at [facility], [address] [place], [place] [address]    Special Requests   Final    NONE Performed at [facility], [address] [place], [place] [address]    Culture >=100,000 COLONIES/mL ESCHERICHIA COLI (A)  Final   Report Status 04/16/2018 FINAL  Final   Organism ID, Bacteria ESCHERICHIA COLI (A)  Final      Susceptibility   Escherichia coli - MIC*    AMPICILLIN >=32 RESISTANT Resistant     CEFAZOLIN <=4 SENSITIVE Sensitive     CEFTRIAXONE <=1 SENSITIVE Sensitive     CIPROFLOXACIN >=4 RESISTANT Resistant     GENTAMICIN <=1 SENSITIVE Sensitive     IMIPENEM <=0.25 SENSITIVE Sensitive     NITROFURANTOIN <=16 SENSITIVE Sensitive     TRIMETH/SULFA >=320 RESISTANT Resistant     AMPICILLIN/SULBACTAM 16 INTERMEDIATE Intermediate     PIP/TAZO <=4 SENSITIVE Sensitive     Extended ESBL NEGATIVE Sensitive     * >=100,000 COLONIES/mL ESCHERICHIA COLI  Culture, blood (routine x 2)     Status: None (Preliminary result)   Collection Time: 04/18/18  7:33 AM  Result Value Ref Range Status   Specimen Description BLOOD LEFT ARM  Final   Special Requests   Final    BOTTLES DRAWN AEROBIC AND ANAEROBIC Blood Culture adequate  volume   Culture   Final    NO GROWTH < 24 HOURS Performed at [facility], [address], [place], [place] [address]    Report Status PENDING  Incomplete  Culture, blood (routine x 2)     Status: None (Preliminary result)   Collection Time: 04/18/18  7:42 AM  Result Value Ref Range Status   Specimen Description BLOOD RIGHT HAND  Final   Special Requests   Final    BOTTLES DRAWN AEROBIC ONLY Blood Culture adequate volume   Culture   Final    NO GROWTH < 24 HOURS Performed at [facility], [address], [place], [place] [address]    Report Status PENDING  Incomplete     Labs: Basic Metabolic Panel: Recent Labs  Lab 04/13/18 2050 04/14/18 0305 04/16/18 0547 04/17/18 0536 04/18/18 0503  NA 138  --  139 142 142  K 3.1*  --  3.6 3.3* 3.1*  CL 107  --  110 114* 110  CO2 22  --  23 22 24   GLUCOSE 133*  --  105* 114* 94  BUN 13  --  10 8 7*  CREATININE 0.87 0.80 0.82 0.70 0.65  CALCIUM 8.4*  --  8.2* 8.0* 8.3*   Liver Function Tests: Recent Labs  Lab 04/13/18 2050  AST 23  ALT 15  ALKPHOS 97  BILITOT 2.3*  PROT 7.0  ALBUMIN 3.3*   Recent Labs  Lab 04/13/18 2050  LIPASE 23   No results for input(s): AMMONIA in the last 168 hours. CBC: Recent Labs  Lab 04/13/18 2050 04/14/18 0408 04/16/18 0547 04/17/18 0536 04/18/18 0503  WBC 9.6 14.4* 6.2 4.0 4.2  NEUTROABS 8.5*  --   --   --   --  HGB 12.6 11.5* 10.6* 10.7* 10.7*  HCT 38.5 35.6* 33.6* 33.1* 32.9*  MCV 84.8 85.2 86.6 85.5 84.8  PLT 180 172 164 175 186   Cardiac Enzymes: Recent Labs  Lab 04/13/18 2050 04/14/18 0305 04/14/18 0849 04/14/18 1451  TROPONINI 0.16* 0.14* 0.16* 0.14*   BNP: BNP (last 3 results) No results for input(s): BNP in the last 8760 hours.  ProBNP (last 3 results) No results for input(s): PROBNP in the last 8760 hours.  CBG: No results for input(s): GLUCAP in the last 168 hours.     Signed:  Briant Cedar, MD [facility] 04/19/2018, 10:05 AM

## 2018-04-23 LAB — CULTURE, BLOOD (ROUTINE X 2)
CULTURE: NO GROWTH
Culture: NO GROWTH
SPECIAL REQUESTS: ADEQUATE
SPECIAL REQUESTS: ADEQUATE

## 2018-04-26 ENCOUNTER — Ambulatory Visit (HOSPITAL_COMMUNITY): Admission: RE | Admit: 2018-04-26 | Payer: Medicare Other | Source: Ambulatory Visit | Admitting: Orthopedic Surgery

## 2018-04-26 ENCOUNTER — Encounter (HOSPITAL_COMMUNITY): Admission: RE | Payer: Self-pay | Source: Ambulatory Visit

## 2018-04-26 SURGERY — INSERTION, SPINAL CORD STIMULATOR, LUMBAR
Anesthesia: General

## 2018-05-01 ENCOUNTER — Other Ambulatory Visit (HOSPITAL_COMMUNITY)
Admission: AD | Admit: 2018-05-01 | Discharge: 2018-05-01 | Disposition: A | Discharge status: Home / Self Care | Payer: Medicare Other | Source: Skilled Nursing Facility | Attending: Urology | Admitting: Urology

## 2018-05-01 ENCOUNTER — Ambulatory Visit (INDEPENDENT_AMBULATORY_CARE_PROVIDER_SITE_OTHER): Payer: Medicare Other | Admitting: Urology

## 2018-05-01 DIAGNOSIS — N10 Acute tubulo-interstitial nephritis: Secondary | ICD-10-CM | POA: Insufficient documentation

## 2018-05-01 DIAGNOSIS — N200 Calculus of kidney: Secondary | ICD-10-CM

## 2018-05-03 ENCOUNTER — Other Ambulatory Visit: Payer: Self-pay | Admitting: Urology

## 2018-05-03 DIAGNOSIS — N200 Calculus of kidney: Secondary | ICD-10-CM

## 2018-05-03 LAB — URINE CULTURE

## 2018-05-04 ENCOUNTER — Other Ambulatory Visit: Payer: Self-pay | Admitting: Urology

## 2018-05-14 NOTE — Progress Notes (Signed)
04-13-18 (Epic) CXR

## 2018-05-14 NOTE — Progress Notes (Signed)
04-16-18 (Epic) EKG, ECHO

## 2018-05-14 NOTE — Patient Instructions (Addendum)
Andrea Norton  05/14/2018   Your procedure is scheduled on: 05-21-18     Report to [facility] Main  Entrance    Report to Admitting at 9:45 AM    Call this number if you have problems the morning of surgery [phone]     Remember: Do not eat food or drink liquids :After Midnight.    BRUSH YOUR TEETH MORNING OF SURGERY AND RINSE YOUR MOUTH OUT, NO CHEWING GUM CANDY OR MINTS.     Take these medicines the morning of surgery with A SIP OF WATER: None              You may not have any metal on your body including hair pins and              piercings  Do not wear jewelry, make-up, lotions, powders or perfumes, deodorant             Do not wear nail polish.  Do not shave  48 hours prior to surgery.                 Do not bring valuables to the hospital. [facility] IS NOT             RESPONSIBLE   FOR VALUABLES.  Contacts, dentures or bridgework may not be worn into surgery.  Leave suitcase in the car. After surgery it may be brought to your room.    Special Instructions: N/A              Please read over the following fact sheets you were given: _____________________________________________________________________             [facility] - Preparing for Surgery Before surgery, you can play an important role.  Because skin is not sterile, your skin needs to be as free of germs as possible.  You can reduce the number of germs on your skin by washing with CHG (chlorahexidine gluconate) soap before surgery.  CHG is an antiseptic cleaner which kills germs and bonds with the skin to continue killing germs even after washing. Please DO NOT use if you have an allergy to CHG or antibacterial soaps.  If your skin becomes reddened/irritated stop using the CHG and inform your nurse when you arrive at Short Stay. Do not shave (including legs and underarms) for at least 48 hours prior to the first CHG shower.  You may shave your face/neck. Please follow these  instructions carefully:  1.  Shower with CHG Soap the night before surgery and the  morning of Surgery.  2.  If you choose to wash your hair, wash your hair first as usual with your  normal  shampoo.  3.  After you shampoo, rinse your hair and body thoroughly to remove the  shampoo.                           4.  Use CHG as you would any other liquid soap.  You can apply chg directly  to the skin and wash                       Gently with a scrungie or clean washcloth.  5.  Apply the CHG Soap to your body ONLY FROM THE NECK DOWN.   Do not use on face/ open  Wound or open sores. Avoid contact with eyes, ears mouth and genitals (private parts).                       Wash face,  Genitals (private parts) with your normal soap.             6.  Wash thoroughly, paying special attention to the area where your surgery  will be performed.  7.  Thoroughly rinse your body with warm water from the neck down.  8.  DO NOT shower/wash with your normal soap after using and rinsing off  the CHG Soap.                9.  Pat yourself dry with a clean towel.            10.  Wear clean pajamas.            11.  Place clean sheets on your bed the night of your first shower and do not  sleep with pets. Day of Surgery : Do not apply any lotions/deodorants the morning of surgery.  Please wear clean clothes to the hospital/surgery center.  FAILURE TO FOLLOW THESE INSTRUCTIONS MAY RESULT IN THE CANCELLATION OF YOUR SURGERY PATIENT SIGNATURE_________________________________  NURSE SIGNATURE__________________________________  ________________________________________________________________________   [name]  An incentive spirometer is a tool that can help keep your lungs clear and active. This tool measures how well you are filling your lungs with each breath. Taking long deep breaths may help reverse or decrease the chance of developing breathing (pulmonary) problems (especially  infection) following:  A long period of time when you are unable to move or be active. BEFORE THE PROCEDURE   If the spirometer includes an indicator to show your best effort, your nurse or respiratory therapist will set it to a desired goal.  If possible, sit up straight or lean slightly forward. Try not to slouch.  Hold the incentive spirometer in an upright position. INSTRUCTIONS FOR USE  1. Sit on the edge of your bed if possible, or sit up as far as you can in bed or on a chair. 2. Hold the incentive spirometer in an upright position. 3. Breathe out normally. 4. Place the mouthpiece in your mouth and seal your lips tightly around it. 5. Breathe in slowly and as deeply as possible, raising the piston or the ball toward the top of the column. 6. Hold your breath for 3-5 seconds or for as long as possible. Allow the piston or ball to fall to the bottom of the column. 7. Remove the mouthpiece from your mouth and breathe out normally. 8. Rest for a few seconds and repeat Steps 1 through 7 at least 10 times every 1-2 hours when you are awake. Take your time and take a few normal breaths between deep breaths. 9. The spirometer may include an indicator to show your best effort. Use the indicator as a goal to work toward during each repetition. 10. After each set of 10 deep breaths, practice coughing to be sure your lungs are clear. If you have an incision (the cut made at the time of surgery), support your incision when coughing by placing a pillow or rolled up towels firmly against it. Once you are able to get out of bed, walk around indoors and cough well. You may stop using the incentive spirometer when instructed by your caregiver.  RISKS AND COMPLICATIONS  Take your time so you do not get  dizzy or light-headed.  If you are in pain, you may need to take or ask for pain medication before doing incentive spirometry. It is harder to take a deep breath if you are having pain. AFTER  USE  Rest and breathe slowly and easily.  It can be helpful to keep track of a log of your progress. Your caregiver can provide you with a simple table to help with this. If you are using the spirometer at home, follow these instructions: [place] IF:   You are having difficultly using the spirometer.  You have trouble using the spirometer as often as instructed.  Your pain medication is not giving enough relief while using the spirometer.  You develop fever of 100.5 F (38.1 C) or higher. SEEK IMMEDIATE MEDICAL CARE IF:   You cough up bloody sputum that had not been present before.  You develop fever of 102 F (38.9 C) or greater.  You develop worsening pain at or near the incision site. MAKE SURE YOU:   Understand these instructions.  Will watch your condition.  Will get help right away if you are not doing well or get worse. Document Released: 11/21/2006 Document Revised: 10/03/2011 Document Reviewed: 01/22/2007 ExitCare Patient Information 2014 Exit[employer], [place].   ________________________________________________________________________  WHAT IS A BLOOD TRANSFUSION? Blood Transfusion Information  A transfusion is the replacement of blood or some of its parts. Blood is made up of multiple cells which provide different functions.  Red blood cells carry oxygen and are used for blood loss replacement.  White blood cells fight against infection.  Platelets control bleeding.  Plasma helps clot blood.  Other blood products are available for specialized needs, such as hemophilia or other clotting disorders. BEFORE THE TRANSFUSION  Who gives blood for transfusions?   Healthy volunteers who are fully evaluated to make sure their blood is safe. This is blood bank blood. Transfusion therapy is the safest it has ever been in the practice of medicine. Before blood is taken from a donor, a complete history is taken to make sure that person has no history of diseases  nor engages in risky social behavior (examples are intravenous drug use or sexual activity with multiple partners). The donor's travel history is screened to minimize risk of transmitting infections, such as malaria. The donated blood is tested for signs of infectious diseases, such as HIV and hepatitis. The blood is then tested to be sure it is compatible with you in order to minimize the chance of a transfusion reaction. If you or a relative donates blood, this is often done in anticipation of surgery and is not appropriate for emergency situations. It takes many days to process the donated blood. RISKS AND COMPLICATIONS Although transfusion therapy is very safe and saves many lives, the main dangers of transfusion include:   Getting an infectious disease.  Developing a transfusion reaction. This is an allergic reaction to something in the blood you were given. Every precaution is taken to prevent this. The decision to have a blood transfusion has been considered carefully by your caregiver before blood is given. Blood is not given unless the benefits outweigh the risks. AFTER THE TRANSFUSION  Right after receiving a blood transfusion, you will usually feel much better and more energetic. This is especially true if your red blood cells have gotten low (anemic). The transfusion raises the level of the red blood cells which carry oxygen, and this usually causes an energy increase.  The nurse administering the transfusion will  monitor you carefully for complications. HOME CARE INSTRUCTIONS  No special instructions are needed after a transfusion. You may find your energy is better. Speak with your caregiver about any limitations on activity for underlying diseases you may have. SEEK MEDICAL CARE IF:   Your condition is not improving after your transfusion.  You develop redness or irritation at the intravenous (IV) site. SEEK IMMEDIATE MEDICAL CARE IF:  Any of the following symptoms occur over the  next 12 hours:  Shaking chills.  You have a temperature by mouth above 102 F (38.9 C), not controlled by medicine.  Chest, back, or muscle pain.  People around you feel you are not acting correctly or are confused.  Shortness of breath or difficulty breathing.  Dizziness and fainting.  You get a rash or develop hives.  You have a decrease in urine output.  Your urine turns a dark color or changes to pink, red, or brown. Any of the following symptoms occur over the next 10 days:  You have a temperature by mouth above 102 F (38.9 C), not controlled by medicine.  Shortness of breath.  Weakness after normal activity.  The white part of the eye turns yellow (jaundice).  You have a decrease in the amount of urine or are urinating less often.  Your urine turns a dark color or changes to pink, red, or brown. Document Released: 07/08/2000 Document Revised: 10/03/2011 Document Reviewed: 02/25/2008 [facility] Patient Information 2014 [place], [place].  _______________________________________________________________________

## 2018-05-15 ENCOUNTER — Encounter (HOSPITAL_COMMUNITY)
Admission: RE | Admit: 2018-05-15 | Discharge: 2018-05-15 | Disposition: A | Discharge status: Home / Self Care | Payer: Medicare Other | Source: Ambulatory Visit | Attending: Urology | Admitting: Urology

## 2018-05-15 ENCOUNTER — Other Ambulatory Visit: Payer: Self-pay

## 2018-05-15 ENCOUNTER — Encounter (HOSPITAL_COMMUNITY): Payer: Self-pay

## 2018-05-15 DIAGNOSIS — Z01812 Encounter for preprocedural laboratory examination: Secondary | ICD-10-CM | POA: Insufficient documentation

## 2018-05-15 LAB — BASIC METABOLIC PANEL
ANION GAP: 6 (ref 5–15)
BUN: 13 mg/dL (ref 8–23)
CO2: 27 mmol/L (ref 22–32)
Calcium: 8.7 mg/dL — ABNORMAL LOW (ref 8.9–10.3)
Chloride: 110 mmol/L (ref 98–111)
Creatinine, Ser: 0.8 mg/dL (ref 0.44–1.00)
GFR calc Af Amer: >60 mL/min (ref >60)
Glucose, Bld: 112 mg/dL — ABNORMAL HIGH (ref 70–99)
POTASSIUM: 3.5 mmol/L (ref 3.5–5.1)
Sodium: 143 mmol/L (ref 135–145)

## 2018-05-15 LAB — CBC
HCT: 38.2 % (ref 36.0–46.0)
HEMOGLOBIN: 11.9 g/dL — AB (ref 12.0–15.0)
MCH: 27.4 pg (ref 26.0–34.0)
MCHC: 31.2 g/dL (ref 30.0–36.0)
MCV: 87.8 fL (ref 80.0–100.0)
NRBC: 0 % (ref 0.0–0.2)
PLATELETS: 228 10*3/uL (ref 150–400)
RBC: 4.35 MIL/uL (ref 3.87–5.11)
RDW: 13.9 % (ref 11.5–15.5)
WBC: 4.7 10*3/uL (ref 4.0–10.5)

## 2018-05-18 ENCOUNTER — Other Ambulatory Visit: Payer: Self-pay | Admitting: Student

## 2018-05-21 ENCOUNTER — Ambulatory Visit (HOSPITAL_COMMUNITY): Payer: Medicare Other

## 2018-05-21 ENCOUNTER — Encounter (HOSPITAL_COMMUNITY): Admission: RE | Payer: Self-pay | Source: Ambulatory Visit

## 2018-05-21 ENCOUNTER — Inpatient Hospital Stay (HOSPITAL_COMMUNITY): Admission: RE | Admit: 2018-05-21 | Payer: Medicare Other | Source: Ambulatory Visit

## 2018-05-21 ENCOUNTER — Ambulatory Visit (HOSPITAL_COMMUNITY): Admission: RE | Admit: 2018-05-21 | Payer: Medicare Other | Source: Ambulatory Visit | Admitting: Urology

## 2018-05-21 ENCOUNTER — Encounter (HOSPITAL_COMMUNITY): Payer: Self-pay | Admitting: Anesthesiology

## 2018-05-21 LAB — TYPE AND SCREEN
ABO/RH(D): O POS
Antibody Screen: NEGATIVE

## 2018-05-21 SURGERY — NEPHROLITHOTOMY PERCUTANEOUS
Anesthesia: General | Laterality: Right

## 2018-05-25 NOTE — Progress Notes (Signed)
Patient called and instructed to arrive at Admitting at [facility] on 06/04/2018 at 0715. Instructed patient, no medications to take the morning of surgery and Nothing to eat or drink after midnight. Patient verbalized understanding.

## 2018-05-31 ENCOUNTER — Other Ambulatory Visit: Payer: Self-pay | Admitting: Physician Assistant

## 2018-06-01 ENCOUNTER — Other Ambulatory Visit: Payer: Self-pay

## 2018-06-01 ENCOUNTER — Ambulatory Visit (HOSPITAL_COMMUNITY)
Admission: RE | Admit: 2018-06-01 | Discharge: 2018-06-01 | Disposition: A | Discharge status: Home / Self Care | Payer: Medicare Other | Source: Ambulatory Visit | Attending: Urology | Admitting: Urology

## 2018-06-01 ENCOUNTER — Encounter (HOSPITAL_COMMUNITY): Payer: Self-pay

## 2018-06-01 DIAGNOSIS — Z87891 Personal history of nicotine dependence: Secondary | ICD-10-CM | POA: Insufficient documentation

## 2018-06-01 DIAGNOSIS — M5030 Other cervical disc degeneration, unspecified cervical region: Secondary | ICD-10-CM | POA: Diagnosis not present

## 2018-06-01 DIAGNOSIS — N132 Hydronephrosis with renal and ureteral calculous obstruction: Secondary | ICD-10-CM | POA: Diagnosis present

## 2018-06-01 DIAGNOSIS — G8929 Other chronic pain: Secondary | ICD-10-CM | POA: Diagnosis not present

## 2018-06-01 DIAGNOSIS — K219 Gastro-esophageal reflux disease without esophagitis: Secondary | ICD-10-CM | POA: Diagnosis not present

## 2018-06-01 DIAGNOSIS — E039 Hypothyroidism, unspecified: Secondary | ICD-10-CM | POA: Diagnosis not present

## 2018-06-01 DIAGNOSIS — I10 Essential (primary) hypertension: Secondary | ICD-10-CM | POA: Diagnosis not present

## 2018-06-01 DIAGNOSIS — G4730 Sleep apnea, unspecified: Secondary | ICD-10-CM | POA: Insufficient documentation

## 2018-06-01 DIAGNOSIS — Z7982 Long term (current) use of aspirin: Secondary | ICD-10-CM | POA: Diagnosis not present

## 2018-06-01 DIAGNOSIS — I2510 Atherosclerotic heart disease of native coronary artery without angina pectoris: Secondary | ICD-10-CM | POA: Diagnosis not present

## 2018-06-01 DIAGNOSIS — E785 Hyperlipidemia, unspecified: Secondary | ICD-10-CM | POA: Diagnosis not present

## 2018-06-01 DIAGNOSIS — M5136 Other intervertebral disc degeneration, lumbar region: Secondary | ICD-10-CM | POA: Diagnosis not present

## 2018-06-01 DIAGNOSIS — Z955 Presence of coronary angioplasty implant and graft: Secondary | ICD-10-CM | POA: Insufficient documentation

## 2018-06-01 DIAGNOSIS — F419 Anxiety disorder, unspecified: Secondary | ICD-10-CM | POA: Insufficient documentation

## 2018-06-01 DIAGNOSIS — N200 Calculus of kidney: Secondary | ICD-10-CM

## 2018-06-01 DIAGNOSIS — F329 Major depressive disorder, single episode, unspecified: Secondary | ICD-10-CM | POA: Insufficient documentation

## 2018-06-01 DIAGNOSIS — Z885 Allergy status to narcotic agent status: Secondary | ICD-10-CM | POA: Diagnosis not present

## 2018-06-01 HISTORY — PX: IR URETERAL STENT RIGHT NEW ACCESS W/O SEP NEPHROSTOMY CATH: IMG6076

## 2018-06-01 LAB — CBC
HCT: 38.6 % (ref 36.0–46.0)
HEMOGLOBIN: 12.2 g/dL (ref 12.0–15.0)
MCH: 28 pg (ref 26.0–34.0)
MCHC: 31.6 g/dL (ref 30.0–36.0)
MCV: 88.5 fL (ref 80.0–100.0)
PLATELETS: 361 10*3/uL (ref 150–400)
RBC: 4.36 MIL/uL (ref 3.87–5.11)
RDW: 13.9 % (ref 11.5–15.5)
WBC: 5.7 10*3/uL (ref 4.0–10.5)
nRBC: 0 % (ref 0.0–0.2)

## 2018-06-01 LAB — APTT: APTT: 34 s (ref 24–36)

## 2018-06-01 LAB — PROTIME-INR
INR: 1.06
Prothrombin Time: 13.7 seconds (ref 11.4–15.2)

## 2018-06-01 MED ORDER — MIDAZOLAM HCL 2 MG/2ML IJ SOLN
INTRAMUSCULAR | Status: AC
  Filled 2018-06-01: qty 4

## 2018-06-01 MED ORDER — LIDOCAINE HCL (PF) 1 % IJ SOLN
INTRAMUSCULAR | Status: AC | PRN
  Administered 2018-06-01: 10 mL
  Administered 2018-06-01: 5 mL

## 2018-06-01 MED ORDER — FENTANYL CITRATE (PF) 100 MCG/2ML IJ SOLN
INTRAMUSCULAR | Status: AC
  Filled 2018-06-01: qty 2

## 2018-06-01 MED ORDER — MIDAZOLAM HCL 2 MG/2ML IJ SOLN
INTRAMUSCULAR | Status: AC | PRN
  Administered 2018-06-01 (×4): 1 mg via INTRAVENOUS

## 2018-06-01 MED ORDER — FENTANYL CITRATE (PF) 100 MCG/2ML IJ SOLN
INTRAMUSCULAR | Status: AC | PRN
  Administered 2018-06-01 (×2): 50 ug via INTRAVENOUS

## 2018-06-01 MED ORDER — CIPROFLOXACIN IN D5W 400 MG/200ML IV SOLN
400.0000 mg | Freq: Once | INTRAVENOUS | Status: DC
  Administered 2018-06-01: 400 mg via INTRAVENOUS

## 2018-06-01 MED ORDER — IOPAMIDOL (ISOVUE-300) INJECTION 61%
50.0000 mL | Freq: Once | INTRAVENOUS | Status: AC | PRN
  Administered 2018-06-01: 30 mL

## 2018-06-01 MED ORDER — SODIUM CHLORIDE 0.9 % IV SOLN: 1.0000 g | Freq: Once | INTRAVENOUS | Status: DC

## 2018-06-01 MED ORDER — IOPAMIDOL (ISOVUE-300) INJECTION 61%
INTRAVENOUS | Status: AC
  Administered 2018-06-01: 30 mL
  Filled 2018-06-01: qty 50

## 2018-06-01 MED ORDER — CIPROFLOXACIN IN D5W 400 MG/200ML IV SOLN
INTRAVENOUS | Status: AC
  Administered 2018-06-01: 400 mg via INTRAVENOUS
  Filled 2018-06-01: qty 200

## 2018-06-01 MED ORDER — SODIUM CHLORIDE 0.9 % IV SOLN
INTRAVENOUS | Status: DC
  Administered 2018-06-01: 10:00:00 via INTRAVENOUS

## 2018-06-01 MED ORDER — LIDOCAINE HCL 1 % IJ SOLN
INTRAMUSCULAR | Status: AC
  Filled 2018-06-01: qty 20

## 2018-06-01 NOTE — Procedures (Signed)
Interventional Radiology Procedure Note  Procedure: Placement of a right percutaneous nephroureteral 63F catheter, into the bladder, for access for future lithotripsy.   63F catheter via inferior posterior calyx.  The catheter will accept any 035 wire to the bladder.    Complications: None  Recommendations:  - DC in 1 hour when goals met - expect some bladder spasm/stent pain.  Patient is already taking ditropan per Urology - Do not submerge - Routine care - expect some hematuria over the weekend.    Signed,  Dulcy Fanny. Earleen Newport, DO

## 2018-06-01 NOTE — Discharge Instructions (Signed)
Moderate Conscious Sedation, Adult, Care After These instructions provide you with information about caring for yourself after your procedure. Your health care provider may also give you more specific instructions. Your treatment has been planned according to current medical practices, but problems sometimes occur. Call your health care provider if you have any problems or questions after your procedure. What can I expect after the procedure? After your procedure, it is common:  To feel sleepy for several hours.  To feel clumsy and have poor balance for several hours.  To have poor judgment for several hours.  To vomit if you eat too soon.  Follow these instructions at home: For at least 24 hours after the procedure:   Do not: ? Participate in activities where you could fall or become injured. ? Drive. ? Use heavy machinery. ? Drink alcohol. ? Take sleeping pills or medicines that cause drowsiness. ? Make important decisions or sign legal documents. ? Take care of children on your own.  Rest. Eating and drinking  Follow the diet recommended by your health care provider.  If you vomit: ? Drink water, juice, or soup when you can drink without vomiting. ? Make sure you have little or no nausea before eating solid foods. General instructions  Have a responsible adult stay with you until you are awake and alert.  Take over-the-counter and prescription medicines only as told by your health care provider.  If you smoke, do not smoke without supervision.  Keep all follow-up visits as told by your health care provider. This is important. Contact a health care provider if:  You keep feeling nauseous or you keep vomiting.  You feel light-headed.  You develop a rash.  You have a fever. Get help right away if:  You have trouble breathing. This information is not intended to replace advice given to you by your health care provider. Make sure you discuss any questions you have  with your health care provider. Document Released: 05/01/2013 Document Revised: 12/14/2015 Document Reviewed: 10/31/2015 Elsevier Interactive Patient Education  2018 [employer]  Percutaneous Nephrostomy, Care After This sheet gives you information about how to care for yourself after your procedure. Your health care provider may also give you more specific instructions. If you have problems or questions, contact your health care provider. What can I expect after the procedure? After the procedure, it is common to have:  Some soreness where the nephrostomy tube was inserted (tube insertion site).  Blood-tinged drainage from the nephrostomy tube for the first 24 hours.  Follow these instructions at home: Activity  Return to your normal activities as told by your health care provider. Ask your health care provider what activities are safe for you.  Avoid activities that may cause the nephrostomy tubing to bend.  Do not take baths, swim, or use a hot tub until your health care provider approves. Ask your health care provider if you can take showers. Cover the nephrostomy tube dressing with a watertight covering when you take a shower.  Donot drive for 24 hours if you were given a medicine to help you relax (sedative). Care of the tube insertion site  Follow instructions from your health care provider about how to take care of your tube insertion site. Make sure you: ? Wash your hands with soap and water before you change your bandage (dressing). If soap and water are not available, use hand sanitizer. ? Do not change dressing.     Check the tube insertion area every day  for signs of infection. Check for: ? More redness, swelling, or pain. ? More fluid or blood. ? Warmth. ? Pus or a bad smell.   General instructions  Take over-the-counter and prescription medicines only as told by your health care provider.  Keep all follow-up visits as told by your health care provider. This  is important. Contact a health care provider if:  You have problems with any of the  tubing.  You have persistent pain or soreness in your back.  You have more redness, swelling, or pain around your tube insertion site.  You have more fluid or blood coming from your tube insertion site.  Your tube insertion site feels warm to the touch.  You have pus or a bad smell coming from your tube insertion site.  You have increased urine output or you feel burning when urinating. Get help right away if:  You have pain in your abdomen during the first week.  You have chest pain or have trouble breathing.  You have a new appearance of blood in your urine.  You have a fever or chills.  You have back pain that is not relieved by your medicine.  You have decreased urine output.  Your nephrostomy tube comes out. This information is not intended to replace advice given to you by your health care provider. Make sure you discuss any questions you have with your health care provider. Document Released: 03/03/2004 Document Revised: 04/22/2016 Document Reviewed: 04/22/2016 Elsevier Interactive Patient Education  [employer].

## 2018-06-01 NOTE — Consult Note (Signed)
Chief Complaint: Patient was seen in consultation today for right percutaneous nephrostomy/nephroureteral catheter placement  Referring Physician(s): Dahlstedt,Stephen  Supervising Physician: Gilmer Mor  Patient Status: [facility] - Out-pt  History of Present Illness: Andrea Norton is a 72 y.o. female with history of abdominal pain, prior E. coli UTI, right ureteral stent placement on 04/14/2018 and recent CT scan revealing a large right renal pelvis/right UPJ stone with mild right hydronephrosis.  She presents today for right percutaneous nephrostomy/nephroureteral catheter placement prior to nephrolithotomy on 11/11.  Past Medical History:  Diagnosis Date  . Abnormal stress test 01-22-2009   LOW RISK ADENOSINE NUCLEAR STUDY W/ PROBABLE MILD APICAL THINNING BUT NO ISCHEMIA  . Anxiety   . Arthritis    HANDS, KNEES  . B12 deficiency anemia   . Bronchitis    hx of   . Chronic back pain   . Chronic bronchitis (HCC)   . Chronic neck pain   . Coronary artery disease 2005   STRESS TEST /, NOte Dr Kirtland Bouchard EPIC/ no cardiologist now--STATES HEART STENT PLACED 2010  . DDD (degenerative disc disease), cervical   . DDD (degenerative disc disease), lumbar   . Depression    states well controlled  . Diverticulitis of colon   . GERD (gastroesophageal reflux disease)   . H/O hiatal hernia   . Hemorrhoid   . Hemorrhoids   . History of urinary tract infection   . Hyperlipidemia   . Hypertension      PCP Dr Lysbeth Galas; pt currently on no medication for treatment   . Hypothyroidism   . Inguinal hernia, right    with ventral hernia  . Pneumonia    hx of walking pneumonia  . Radicular pain in left arm OCCASIONAL PAIN SHOOTING DOWN LEFT ARM SECAONDARY TO  CERVICAL DEGENERATION  . Sleep apnea    couldnt tolerate machine- states last sleep study " many years ago"  . Status post primary angioplasty with coronary stent    DRUG-ELUTING STENT X1  TO PROXIMAL LAD  . Synovitis of knee  HYPERTROPHIC RIGHT KNEE  . Urinary incontinence   . Vitamin D deficiency     Past Surgical History:  Procedure Laterality Date  . APPENDECTOMY  1993  . CATARACT EXTRACTION W/PHACO Right 05/06/2014   Procedure: CATARACT EXTRACTION PHACO AND INTRAOCULAR LENS PLACEMENT (IOC);  Surgeon: Loraine Leriche T. Nile Riggs, MD;  Location: [facility];  Service: Ophthalmology;  Laterality: Right;  CDE 6.41  . CATARACT EXTRACTION W/PHACO Left 05/20/2014   Procedure: CATARACT EXTRACTION PHACO AND INTRAOCULAR LENS PLACEMENT (IOC);  Surgeon: Loraine Leriche T. Nile Riggs, MD;  Location: [facility];  Service: Ophthalmology;  Laterality: Left;  CDE:4.42  . COLON SURGERY    . CORONARY ANGIOPLASTY  2004   DRUG-ELUTING STENT X1 TO PROXIMAL  LAD  . CYSTOSCOPY WITH STENT PLACEMENT Right 04/14/2018   Procedure: CYSTOSCOPY WITH STENT PLACEMENT;  Surgeon: Marcine Matar, MD;  Location: [facility];  Service: Urology;  Laterality: Right;  . EXCISIONAL TOTAL KNEE ARTHROPLASTY WITH ANTIBIOTIC SPACERS Right 09/14/2015   Procedure: RIGHT TOTAL KNEE RESSECTION ARTHROPLASTY WITH ANTIBIOTIC SPACERS;  Surgeon: Ollen Gross, MD;  Location: [facility];  Service: Orthopedics;  Laterality: Right;  . EYE SURGERY    . HEMICOLECTOMY FOR DIVERTICULITIS  2005  . HYSTEROSCOPY W/D&C  2002  . I&D KNEE WITH POLY EXCHANGE  02/01/2012   Procedure: IRRIGATION AND DEBRIDEMENT KNEE WITH POLY EXCHANGE;  Surgeon: Loanne Drilling, MD;  Location: [facility];  Service: Orthopedics;  Laterality: Right;  Right Knee  Polyethlene Revision   . IRRIGATION AND DEBRIDEMENT KNEE Right 03/10/2015   Procedure: IRRIGATION AND DEBRIDEMENT of right KNEE;  Surgeon: Ollen Gross, MD;  Location: [facility];  Service: Orthopedics;  Laterality: Right;  . JOINT REPLACEMENT     right knee   . KNEE ARTHROSCOPY  LEFT 1999   RIGHT 2004  . KNEE ARTHROSCOPY  08/03/2011   Procedure: ARTHROSCOPY KNEE;  Surgeon: Loanne Drilling;  Location: [facility];  Service: Orthopedics;  Laterality: Right;  . KNEE  ARTHROSCOPY Right 12/17/2014   Procedure: RIGHT ARTHROSCOPY KNEE, SYNOVECTOMY;  Surgeon: Ollen Gross, MD;  Location: [facility];  Service: Orthopedics;  Laterality: Right;  . KNEE ARTHROSCOPY W/ DEBRIDEMENT  01-22-2010   SEPTIC KNEE  . KNEE CLOSED REDUCTION  05-07-2009  RIGHT KNEE   LEFT KNEE  10-01-2009  . LAPAROSCOPIC GASTRIC SLEEVE RESECTION N/A 07/21/2014   Procedure: LAPAROSCOPIC GASTRIC SLEEVE RESECTION, TAKEDOWN OF INCARCERATED VENTRAL HERNIA, ENTERAL LYSIS, UPPER ENDOSCOPY;  Surgeon: Valarie Merino, MD;  Location: [facility];  Service: General;  Laterality: N/A;  . LEFT FOOT SURG.  1990  . OPEN PATELLOFEMORAL RIGHT KNEE/ LATERAL RELEASE  05-04-2009  . QUADRICEPS TENDON REPAIR Right 06/02/2015   Procedure: RIGHT KNEE QUADRICEP TENDON REPAIR ;  Surgeon: Ollen Gross, MD;  Location: [facility];  Service: Orthopedics;  Laterality: Right;  . RIGHT KNEE I & D POLYTHYLENE REVISION  02-04-2010   KNEE INFECTED  . SYNOVECTOMY  08/03/2011   Procedure: SYNOVECTOMY;  Surgeon: Loanne Drilling;  Location: [facility];  Service: Orthopedics;  Laterality: Right;  . TOTAL KNEE ARTHROPLASTY  LEFT  2005   RIGHT  03-13-2009  . TOTAL KNEE ARTHROPLASTY Right 11/23/2015   Procedure: RIGHT TOTAL KNEE ARTHROPLASTY REIMPLANTATION;  Surgeon: Ollen Gross, MD;  Location: [facility];  Service: Orthopedics;  Laterality: Right;  . TOTAL KNEE REVISION  RIGHT 09-30-2009   LEFT 11-25-2009  . TOTAL KNEE REVISION Right 02/20/2013   Procedure: RIGHT TOTAL KNEE ARTHROPLASTY REVISION VERSUS RESECTION ARTHROPLASTY;  Surgeon: Loanne Drilling, MD;  Location: [facility];  Service: Orthopedics;  Laterality: Right;  . TOTAL KNEE REVISION Right 04/02/2014   Procedure: RIGHT KNEE FEMORAL ARTHROPLASTY REVISION;  Surgeon: Loanne Drilling, MD;  Location: [facility];  Service: Orthopedics;  Laterality: Right;    Allergies: Oxybutynin and Morphine and related  Medications: Prior to Admission medications   Medication Sig Start Date End Date  Taking? Authorizing Provider  acetaminophen (TYLENOL) 500 MG tablet Take 1,000 mg by mouth every 6 (six) hours as needed for moderate pain or headache.    Yes [provider]  escitalopram (LEXAPRO) 10 MG tablet Take 10 mg by mouth daily with lunch.    Yes [provider]  gabapentin (NEURONTIN) 100 MG capsule Take 300 mg by mouth 2 (two) times daily.    Yes [provider]  levothyroxine (SYNTHROID, LEVOTHROID) 125 MCG tablet Take 125 mcg by mouth daily after lunch.    Yes [provider]  pantoprazole (PROTONIX) 40 MG tablet Take 40 mg by mouth daily with lunch.    Yes [provider]  simvastatin (ZOCOR) 40 MG tablet Take 40 mg by mouth daily with lunch.    Yes [provider]  sulfamethoxazole-trimethoprim (BACTRIM DS,SEPTRA DS) 800-160 MG tablet Take 1 tablet by mouth 2 (two) times daily.   Yes [provider]  aspirin EC 81 MG tablet Take 81 mg by mouth at bedtime.     [provider]  Cholecalciferol (  VITAMIN D3) 2000 units TABS Take 2,000 Units by mouth daily.    [provider]  cyanocobalamin (,VITAMIN B-12,) 1000 MCG/ML injection Inject 1,000 mcg into the muscle every 30 (thirty) days.  11/16/16   [provider]  meloxicam (MOBIC) 7.5 MG tablet Take 7.5 mg by mouth 2 (two) times daily.     [provider]  methocarbamol (ROBAXIN) 500 MG tablet Take 1 tablet (500 mg total) by mouth every 6 (six) hours as needed for muscle spasms. Patient not taking: Reported on 05/08/2018 11/25/15   Julien Girt, Alexzandrew L, PA-C  oxybutynin (DITROPAN) 5 MG tablet Take 5 mg by mouth every 8 (eight) hours as needed for bladder spasms.  05/01/18   [provider]  Vitamin D, Ergocalciferol, (DRISDOL) 50000 units CAPS capsule Take 50,000 Units by mouth every Friday.     [provider]     Family History  Problem Relation Age of Onset  . COPD Mother   . Cancer Father        lung    Social  History   Socioeconomic History  . Marital status: Married    Spouse name: Not on file  . Number of children: Not on file  . Years of education: Not on file  . Highest education level: Not on file  Occupational History  . Not on file  Social Needs  . Financial resource strain: Not on file  . Food insecurity:    Worry: Not on file    Inability: Not on file  . Transportation needs:    Medical: Not on file    Non-medical: Not on file  Tobacco Use  . Smoking status: Former Smoker    Packs/day: 1.00    Years: 30.00    Pack years: 30.00    Types: Cigarettes    Last attempt to quit: 07/31/2002    Years since quitting: 15.8  . Smokeless tobacco: Never Used  Substance and Sexual Activity  . Alcohol use: No  . Drug use: No  . Sexual activity: Not on file  Lifestyle  . Physical activity:    Days per week: Not on file    Minutes per session: Not on file  . Stress: Not on file  Relationships  . Social connections:    Talks on phone: Not on file    Gets together: Not on file    Attends religious service: Not on file    Active member of club or organization: Not on file    Attends meetings of clubs or organizations: Not on file    Relationship status: Not on file  Other Topics Concern  . Not on file  Social History Narrative  . Not on file      Review of Systems denies fever, chest pain, dyspnea, cough, nausea, vomiting or bleeding.  Does have occasional headaches, abdominal/back discomfort as well as some mild dysuria but no visible hematuria  Vital Signs: BP 116/77   Pulse 74   Temp 98.3 F (36.8 C) (Oral)   Resp 18   SpO2 98%   Physical Exam awake, alert.  Chest clear to auscultation bilaterally.  Heart with regular rate and rhythm.  Abdomen soft, positive bowel sounds, mildly tender right lateral abdominal/flank region.  No significant lower extremity edema.  Imaging: No results found.  Labs:  CBC: Recent Labs    04/17/18 0536 04/18/18 0503 05/15/18 1234  06/01/18 1018  WBC 4.0 4.2 4.7 5.7  HGB 10.7* 10.7* 11.9* 12.2  HCT 33.1*  32.9* 38.2 38.6  PLT 175 186 228 361    COAGS: No results for input(s): INR, APTT in the last 8760 hours.  BMP: Recent Labs    04/16/18 0547 04/17/18 0536 04/18/18 0503 05/15/18 1234  NA 139 142 142 143  K 3.6 3.3* 3.1* 3.5  CL 110 114* 110 110  CO2 23 22 24 27   GLUCOSE 105* 114* 94 112*  BUN 10 8 7* 13  CALCIUM 8.2* 8.0* 8.3* 8.7*  CREATININE 0.82 0.70 0.65 0.80  GFRNONAA >60 >60 >60 >60  GFRAA >60 >60 >60 >60    LIVER FUNCTION TESTS: Recent Labs    04/13/18 2050  BILITOT 2.3*  AST 23  ALT 15  ALKPHOS 97  PROT 7.0  ALBUMIN 3.3*    TUMOR MARKERS: No results for input(s): AFPTM, CEA, CA199, CHROMGRNA in the last 8760 hours.  Assessment and Plan: 72 y.o. female with history of abdominal pain, prior E. coli UTI, right ureteral stent placement on 04/14/2018 and recent CT scan revealing a large right renal pelvis/right UPJ stone with mild right hydronephrosis.  She presents today for right percutaneous nephrostomy/nephroureteral catheter placement prior to nephrolithotomy on 11/11.Risks and benefits of procedure were discussed with the patient/family including, but not limited to, infection, bleeding, significant bleeding causing loss or decrease in renal function or damage to adjacent structures as well as inability to place nephrostomy.  All of the patient's questions were answered, patient is agreeable to proceed.  Consent signed and in chart.       Thank you for this interesting consult.  I greatly enjoyed meeting Andrea Norton and look forward to participating in their care.  A copy of this report was sent to the requesting provider on this date.  Electronically Signed: D. Jeananne Rama, PA-C 06/01/2018, 10:59 AM   I spent a total of 25 minutes    in face to face in clinical consultation, greater than 50% of which was counseling/coordinating care for right percutaneous  nephrostomy/nephroureteral catheter placement

## 2018-06-04 ENCOUNTER — Observation Stay (HOSPITAL_COMMUNITY)
Admission: RE | Admit: 2018-06-04 | Discharge: 2018-06-05 | Disposition: A | Discharge status: Home / Self Care | Payer: Medicare Other | Source: Ambulatory Visit | Attending: Urology | Admitting: Urology

## 2018-06-04 ENCOUNTER — Ambulatory Visit (HOSPITAL_COMMUNITY): Payer: Medicare Other | Admitting: Anesthesiology

## 2018-06-04 ENCOUNTER — Other Ambulatory Visit: Payer: Self-pay

## 2018-06-04 ENCOUNTER — Encounter (HOSPITAL_COMMUNITY): Payer: Self-pay

## 2018-06-04 ENCOUNTER — Ambulatory Visit (HOSPITAL_COMMUNITY): Payer: Medicare Other

## 2018-06-04 ENCOUNTER — Encounter (HOSPITAL_COMMUNITY)
Admission: RE | Disposition: A | Discharge status: Home / Self Care | Payer: Self-pay | Source: Ambulatory Visit | Attending: Urology

## 2018-06-04 DIAGNOSIS — I2510 Atherosclerotic heart disease of native coronary artery without angina pectoris: Secondary | ICD-10-CM | POA: Insufficient documentation

## 2018-06-04 DIAGNOSIS — M19042 Primary osteoarthritis, left hand: Secondary | ICD-10-CM | POA: Insufficient documentation

## 2018-06-04 DIAGNOSIS — Z7982 Long term (current) use of aspirin: Secondary | ICD-10-CM | POA: Diagnosis not present

## 2018-06-04 DIAGNOSIS — E785 Hyperlipidemia, unspecified: Secondary | ICD-10-CM | POA: Insufficient documentation

## 2018-06-04 DIAGNOSIS — M549 Dorsalgia, unspecified: Secondary | ICD-10-CM | POA: Diagnosis not present

## 2018-06-04 DIAGNOSIS — E559 Vitamin D deficiency, unspecified: Secondary | ICD-10-CM | POA: Insufficient documentation

## 2018-06-04 DIAGNOSIS — G4730 Sleep apnea, unspecified: Secondary | ICD-10-CM | POA: Diagnosis not present

## 2018-06-04 DIAGNOSIS — Z87440 Personal history of urinary (tract) infections: Secondary | ICD-10-CM | POA: Diagnosis not present

## 2018-06-04 DIAGNOSIS — D62 Acute posthemorrhagic anemia: Secondary | ICD-10-CM | POA: Diagnosis present

## 2018-06-04 DIAGNOSIS — Z87442 Personal history of urinary calculi: Secondary | ICD-10-CM | POA: Insufficient documentation

## 2018-06-04 DIAGNOSIS — Z96651 Presence of right artificial knee joint: Secondary | ICD-10-CM | POA: Insufficient documentation

## 2018-06-04 DIAGNOSIS — Z9841 Cataract extraction status, right eye: Secondary | ICD-10-CM | POA: Insufficient documentation

## 2018-06-04 DIAGNOSIS — M5030 Other cervical disc degeneration, unspecified cervical region: Secondary | ICD-10-CM | POA: Insufficient documentation

## 2018-06-04 DIAGNOSIS — M79602 Pain in left arm: Secondary | ICD-10-CM | POA: Insufficient documentation

## 2018-06-04 DIAGNOSIS — Z955 Presence of coronary angioplasty implant and graft: Secondary | ICD-10-CM | POA: Insufficient documentation

## 2018-06-04 DIAGNOSIS — M19071 Primary osteoarthritis, right ankle and foot: Secondary | ICD-10-CM | POA: Insufficient documentation

## 2018-06-04 DIAGNOSIS — I10 Essential (primary) hypertension: Secondary | ICD-10-CM | POA: Diagnosis not present

## 2018-06-04 DIAGNOSIS — E039 Hypothyroidism, unspecified: Secondary | ICD-10-CM | POA: Diagnosis not present

## 2018-06-04 DIAGNOSIS — M5136 Other intervertebral disc degeneration, lumbar region: Secondary | ICD-10-CM | POA: Diagnosis not present

## 2018-06-04 DIAGNOSIS — N200 Calculus of kidney: Secondary | ICD-10-CM | POA: Diagnosis present

## 2018-06-04 DIAGNOSIS — Z6839 Body mass index (BMI) 39.0-39.9, adult: Secondary | ICD-10-CM | POA: Insufficient documentation

## 2018-06-04 DIAGNOSIS — Z79899 Other long term (current) drug therapy: Secondary | ICD-10-CM | POA: Insufficient documentation

## 2018-06-04 DIAGNOSIS — Z9049 Acquired absence of other specified parts of digestive tract: Secondary | ICD-10-CM | POA: Insufficient documentation

## 2018-06-04 DIAGNOSIS — F419 Anxiety disorder, unspecified: Secondary | ICD-10-CM | POA: Diagnosis not present

## 2018-06-04 DIAGNOSIS — M19072 Primary osteoarthritis, left ankle and foot: Secondary | ICD-10-CM | POA: Diagnosis not present

## 2018-06-04 DIAGNOSIS — Z87891 Personal history of nicotine dependence: Secondary | ICD-10-CM | POA: Insufficient documentation

## 2018-06-04 DIAGNOSIS — M19041 Primary osteoarthritis, right hand: Secondary | ICD-10-CM | POA: Diagnosis not present

## 2018-06-04 DIAGNOSIS — G8929 Other chronic pain: Secondary | ICD-10-CM | POA: Diagnosis not present

## 2018-06-04 DIAGNOSIS — Z885 Allergy status to narcotic agent status: Secondary | ICD-10-CM | POA: Insufficient documentation

## 2018-06-04 DIAGNOSIS — F329 Major depressive disorder, single episode, unspecified: Secondary | ICD-10-CM | POA: Diagnosis not present

## 2018-06-04 DIAGNOSIS — Z801 Family history of malignant neoplasm of trachea, bronchus and lung: Secondary | ICD-10-CM | POA: Insufficient documentation

## 2018-06-04 DIAGNOSIS — K219 Gastro-esophageal reflux disease without esophagitis: Secondary | ICD-10-CM | POA: Insufficient documentation

## 2018-06-04 DIAGNOSIS — K449 Diaphragmatic hernia without obstruction or gangrene: Secondary | ICD-10-CM | POA: Insufficient documentation

## 2018-06-04 DIAGNOSIS — Z9842 Cataract extraction status, left eye: Secondary | ICD-10-CM | POA: Insufficient documentation

## 2018-06-04 DIAGNOSIS — Z825 Family history of asthma and other chronic lower respiratory diseases: Secondary | ICD-10-CM | POA: Insufficient documentation

## 2018-06-04 HISTORY — PX: NEPHROLITHOTOMY: SHX5134

## 2018-06-04 LAB — SAMPLE TO BLOOD BANK

## 2018-06-04 SURGERY — NEPHROLITHOTOMY PERCUTANEOUS
Anesthesia: General | Laterality: Right

## 2018-06-04 MED ORDER — DEXAMETHASONE SODIUM PHOSPHATE 10 MG/ML IJ SOLN
INTRAMUSCULAR | Status: AC
  Filled 2018-06-04: qty 1

## 2018-06-04 MED ORDER — PANTOPRAZOLE SODIUM 40 MG PO TBEC
40.0000 mg | DELAYED_RELEASE_TABLET | Freq: Every day | ORAL | Status: DC
  Administered 2018-06-04: 40 mg via ORAL
  Filled 2018-06-04: qty 1

## 2018-06-04 MED ORDER — ONDANSETRON HCL 4 MG/2ML IJ SOLN: 4.0000 mg | INTRAMUSCULAR | Status: DC | PRN

## 2018-06-04 MED ORDER — HEMOSTATIC AGENTS (NO CHARGE) OPTIME
TOPICAL | Status: DC | PRN
  Administered 2018-06-04: 1 via TOPICAL

## 2018-06-04 MED ORDER — HYDROMORPHONE HCL 1 MG/ML IJ SOLN
INTRAMUSCULAR | Status: AC
  Administered 2018-06-04: 0.5 mg via INTRAVENOUS
  Filled 2018-06-04: qty 1

## 2018-06-04 MED ORDER — ROCURONIUM BROMIDE 10 MG/ML (PF) SYRINGE
PREFILLED_SYRINGE | INTRAVENOUS | Status: AC
  Filled 2018-06-04: qty 10

## 2018-06-04 MED ORDER — ACETAMINOPHEN 500 MG PO TABS
1000.0000 mg | ORAL_TABLET | Freq: Four times a day (QID) | ORAL | Status: DC | PRN
  Filled 2018-06-04: qty 2

## 2018-06-04 MED ORDER — SULFAMETHOXAZOLE-TRIMETHOPRIM 800-160 MG PO TABS
1.0000 | ORAL_TABLET | Freq: Two times a day (BID) | ORAL | Status: DC
  Administered 2018-06-04 – 2018-06-05 (×2): 1 via ORAL
  Filled 2018-06-04 (×3): qty 1

## 2018-06-04 MED ORDER — TRAMADOL HCL 50 MG PO TABS: 50.0000 mg | ORAL_TABLET | Freq: Four times a day (QID) | ORAL | 0 refills | Status: DC | PRN

## 2018-06-04 MED ORDER — LACTATED RINGERS IV SOLN
INTRAVENOUS | Status: DC
  Administered 2018-06-04: 08:00:00 via INTRAVENOUS

## 2018-06-04 MED ORDER — PROPOFOL 10 MG/ML IV BOLUS
INTRAVENOUS | Status: AC
  Filled 2018-06-04: qty 20

## 2018-06-04 MED ORDER — EPHEDRINE SULFATE-NACL 50-0.9 MG/10ML-% IV SOSY
PREFILLED_SYRINGE | INTRAVENOUS | Status: DC | PRN
  Administered 2018-06-04: 10 mg via INTRAVENOUS
  Administered 2018-06-04: 5 mg via INTRAVENOUS
  Administered 2018-06-04: 10 mg via INTRAVENOUS

## 2018-06-04 MED ORDER — LIDOCAINE 2% (20 MG/ML) 5 ML SYRINGE
INTRAMUSCULAR | Status: AC
  Filled 2018-06-04: qty 5

## 2018-06-04 MED ORDER — SCOPOLAMINE 1 MG/3DAYS TD PT72
1.0000 | MEDICATED_PATCH | TRANSDERMAL | Status: DC
  Administered 2018-06-04: 1.5 mg via TRANSDERMAL
  Filled 2018-06-04: qty 1

## 2018-06-04 MED ORDER — PROMETHAZINE HCL 25 MG/ML IJ SOLN: 6.2500 mg | INTRAMUSCULAR | Status: DC | PRN

## 2018-06-04 MED ORDER — SUGAMMADEX SODIUM 500 MG/5ML IV SOLN
INTRAVENOUS | Status: DC | PRN
  Administered 2018-06-04: 250 mg via INTRAVENOUS

## 2018-06-04 MED ORDER — HYDROMORPHONE HCL 1 MG/ML IJ SOLN
0.2500 mg | INTRAMUSCULAR | Status: DC | PRN
  Administered 2018-06-04 (×2): 0.5 mg via INTRAVENOUS

## 2018-06-04 MED ORDER — MENTHOL 3 MG MT LOZG
1.0000 | LOZENGE | OROMUCOSAL | Status: DC | PRN
  Administered 2018-06-05: 3 mg via ORAL
  Filled 2018-06-04: qty 9

## 2018-06-04 MED ORDER — HYDROCODONE-ACETAMINOPHEN 5-325 MG PO TABS
1.0000 | ORAL_TABLET | ORAL | Status: DC | PRN
  Administered 2018-06-04: 2 via ORAL
  Filled 2018-06-04: qty 2

## 2018-06-04 MED ORDER — SODIUM CHLORIDE 0.9 % IR SOLN
Status: DC | PRN
  Administered 2018-06-04: 6000 mL

## 2018-06-04 MED ORDER — 0.9 % SODIUM CHLORIDE (POUR BTL) OPTIME
TOPICAL | Status: DC | PRN
  Administered 2018-06-04: 1000 mL

## 2018-06-04 MED ORDER — LEVOTHYROXINE SODIUM 25 MCG PO TABS
125.0000 ug | ORAL_TABLET | Freq: Every day | ORAL | Status: DC
  Administered 2018-06-05: 125 ug via ORAL
  Filled 2018-06-04: qty 1

## 2018-06-04 MED ORDER — ZOLPIDEM TARTRATE 5 MG PO TABS: 5.0000 mg | ORAL_TABLET | Freq: Every evening | ORAL | Status: DC | PRN

## 2018-06-04 MED ORDER — SODIUM CHLORIDE 0.45 % IV SOLN
INTRAVENOUS | Status: DC
  Administered 2018-06-04 – 2018-06-05 (×2): via INTRAVENOUS

## 2018-06-04 MED ORDER — GABAPENTIN 300 MG PO CAPS
300.0000 mg | ORAL_CAPSULE | Freq: Two times a day (BID) | ORAL | Status: DC
  Administered 2018-06-04 – 2018-06-05 (×2): 300 mg via ORAL
  Filled 2018-06-04 (×2): qty 1

## 2018-06-04 MED ORDER — LIDOCAINE 2% (20 MG/ML) 5 ML SYRINGE
INTRAMUSCULAR | Status: DC | PRN
  Administered 2018-06-04: 100 mg via INTRAVENOUS

## 2018-06-04 MED ORDER — FENTANYL CITRATE (PF) 100 MCG/2ML IJ SOLN
INTRAMUSCULAR | Status: DC | PRN
  Administered 2018-06-04 (×2): 50 ug via INTRAVENOUS
  Administered 2018-06-04: 100 ug via INTRAVENOUS

## 2018-06-04 MED ORDER — IOHEXOL 300 MG/ML  SOLN
INTRAMUSCULAR | Status: DC | PRN
  Administered 2018-06-04: 10 mL

## 2018-06-04 MED ORDER — PROPOFOL 10 MG/ML IV BOLUS
INTRAVENOUS | Status: DC | PRN
  Administered 2018-06-04: 120 mg via INTRAVENOUS

## 2018-06-04 MED ORDER — ONDANSETRON HCL 4 MG/2ML IJ SOLN
INTRAMUSCULAR | Status: AC
  Filled 2018-06-04: qty 2

## 2018-06-04 MED ORDER — MIDAZOLAM HCL 2 MG/2ML IJ SOLN: 0.5000 mg | Freq: Once | INTRAMUSCULAR | Status: DC | PRN

## 2018-06-04 MED ORDER — CEFAZOLIN SODIUM-DEXTROSE 2-4 GM/100ML-% IV SOLN
INTRAVENOUS | Status: AC
  Filled 2018-06-04: qty 100

## 2018-06-04 MED ORDER — FENTANYL CITRATE (PF) 250 MCG/5ML IJ SOLN
INTRAMUSCULAR | Status: AC
  Filled 2018-06-04: qty 5

## 2018-06-04 MED ORDER — SIMVASTATIN 40 MG PO TABS
40.0000 mg | ORAL_TABLET | Freq: Every day | ORAL | Status: DC
  Administered 2018-06-04: 40 mg via ORAL
  Filled 2018-06-04: qty 1

## 2018-06-04 MED ORDER — METHOCARBAMOL 500 MG PO TABS: 500.0000 mg | ORAL_TABLET | Freq: Four times a day (QID) | ORAL | Status: DC | PRN

## 2018-06-04 MED ORDER — ONDANSETRON HCL 4 MG/2ML IJ SOLN
INTRAMUSCULAR | Status: DC | PRN
  Administered 2018-06-04: 4 mg via INTRAVENOUS

## 2018-06-04 MED ORDER — MEPERIDINE HCL 50 MG/ML IJ SOLN: 6.2500 mg | INTRAMUSCULAR | Status: DC | PRN

## 2018-06-04 MED ORDER — DEXAMETHASONE SODIUM PHOSPHATE 10 MG/ML IJ SOLN
INTRAMUSCULAR | Status: DC | PRN
  Administered 2018-06-04: 10 mg via INTRAVENOUS

## 2018-06-04 MED ORDER — SENNA 8.6 MG PO TABS
1.0000 | ORAL_TABLET | Freq: Two times a day (BID) | ORAL | Status: DC
  Administered 2018-06-04 – 2018-06-05 (×2): 8.6 mg via ORAL
  Filled 2018-06-04 (×2): qty 1

## 2018-06-04 MED ORDER — ESCITALOPRAM OXALATE 10 MG PO TABS
10.0000 mg | ORAL_TABLET | Freq: Every day | ORAL | Status: DC
  Administered 2018-06-04: 10 mg via ORAL
  Filled 2018-06-04: qty 1

## 2018-06-04 MED ORDER — CEFAZOLIN SODIUM-DEXTROSE 1-4 GM/50ML-% IV SOLN
INTRAVENOUS | Status: DC | PRN
  Administered 2018-06-04: 2 g via INTRAVENOUS

## 2018-06-04 MED ORDER — ROCURONIUM BROMIDE 10 MG/ML (PF) SYRINGE
PREFILLED_SYRINGE | INTRAVENOUS | Status: DC | PRN
  Administered 2018-06-04: 60 mg via INTRAVENOUS

## 2018-06-04 MED ORDER — EPHEDRINE 5 MG/ML INJ
INTRAVENOUS | Status: AC
  Filled 2018-06-04: qty 10

## 2018-06-04 SURGICAL SUPPLY — 56 items
AGENT HMST KT MTR STRL THRMB (HEMOSTASIS) ×1
APL ESCP 34 STRL LF DISP (HEMOSTASIS) ×1
APL SKNCLS STERI-STRIP NONHPOA (GAUZE/BANDAGES/DRESSINGS) ×1
APPLICATOR SURGIFLO ENDO (HEMOSTASIS) ×1 IMPLANT
BAG URINE DRAINAGE (UROLOGICAL SUPPLIES) IMPLANT
BASKET STONE 1.7 NGAGE (UROLOGICAL SUPPLIES) ×1 IMPLANT
BASKET ZERO TIP NITINOL 2.4FR (BASKET) ×1 IMPLANT
BENZOIN TINCTURE PRP APPL 2/3 (GAUZE/BANDAGES/DRESSINGS) ×3 IMPLANT
BLADE SURG 15 STRL LF DISP TIS (BLADE) ×1 IMPLANT
BLADE SURG 15 STRL SS (BLADE) ×2
BSKT STON RTRVL ZERO TP 2.4FR (BASKET)
CATCHER STONE W/TUBE ADAPTER (UROLOGICAL SUPPLIES) IMPLANT
CATH FOLEY 2W COUNCIL 20FR 5CC (CATHETERS) IMPLANT
CATH FOLEY 2WAY SLVR  5CC 16FR (CATHETERS) ×1
CATH FOLEY 2WAY SLVR 5CC 16FR (CATHETERS) IMPLANT
CATH ROBINSON RED A/P 20FR (CATHETERS) IMPLANT
CATH X-FORCE N30 NEPHROSTOMY (TUBING) ×2 IMPLANT
CHLORAPREP W/TINT 26ML (MISCELLANEOUS) ×1 IMPLANT
COVER SURGICAL LIGHT HANDLE (MISCELLANEOUS) ×1 IMPLANT
COVER WAND RF STERILE (DRAPES) ×1 IMPLANT
DRAPE C-ARM 42X120 X-RAY (DRAPES) ×2 IMPLANT
DRAPE LINGEMAN PERC (DRAPES) ×2 IMPLANT
DRAPE SURG IRRIG POUCH 19X23 (DRAPES) ×2 IMPLANT
DRSG PAD ABDOMINAL 8X10 ST (GAUZE/BANDAGES/DRESSINGS) IMPLANT
DRSG TEGADERM 8X12 (GAUZE/BANDAGES/DRESSINGS) ×3 IMPLANT
FIBER LASER FLEXIVA 1000 (UROLOGICAL SUPPLIES) IMPLANT
FIBER LASER FLEXIVA 365 (UROLOGICAL SUPPLIES) IMPLANT
FIBER LASER FLEXIVA 550 (UROLOGICAL SUPPLIES) IMPLANT
FIBER LASER TRAC TIP (UROLOGICAL SUPPLIES) IMPLANT
GAUZE SPONGE 4X4 12PLY STRL (GAUZE/BANDAGES/DRESSINGS) IMPLANT
GLOVE BIOGEL M 8.0 STRL (GLOVE) ×2 IMPLANT
GOWN STRL REUS W/TWL XL LVL3 (GOWN DISPOSABLE) ×2 IMPLANT
GUIDEWIRE AMPLAZ .035X145 (WIRE) ×4 IMPLANT
GUIDEWIRE STR DUAL SENSOR (WIRE) ×1 IMPLANT
HOLDER FOLEY CATH W/STRAP (MISCELLANEOUS) ×1 IMPLANT
KIT BASIN OR (CUSTOM PROCEDURE TRAY) ×2 IMPLANT
MANIFOLD NEPTUNE II (INSTRUMENTS) ×2 IMPLANT
NS IRRIG 1000ML POUR BTL (IV SOLUTION) ×1 IMPLANT
PACK CYSTO (CUSTOM PROCEDURE TRAY) ×2 IMPLANT
PAD ABD 8X10 STRL (GAUZE/BANDAGES/DRESSINGS) IMPLANT
PROBE LITHOCLAST ULTRA 3.8X403 (UROLOGICAL SUPPLIES) ×1 IMPLANT
PROBE PNEUMATIC 1.0MMX570MM (UROLOGICAL SUPPLIES) IMPLANT
SET IRRIG Y TYPE TUR BLADDER L (SET/KITS/TRAYS/PACK) IMPLANT
SHEATH PEELAWAY SET 9 (SHEATH) ×2 IMPLANT
SPONGE LAP 4X18 RFD (DISPOSABLE) ×2 IMPLANT
STONE CATCHER W/TUBE ADAPTER (UROLOGICAL SUPPLIES) ×2 IMPLANT
SURGIFLO W/THROMBIN 8M KIT (HEMOSTASIS) ×1 IMPLANT
SUT SILK 2 0 30  PSL (SUTURE) ×1
SUT SILK 2 0 30 PSL (SUTURE) ×1 IMPLANT
SYR 10ML LL (SYRINGE) ×2 IMPLANT
SYR 20CC LL (SYRINGE) ×4 IMPLANT
TRAY FOLEY MTR SLVR 14FR STAT (SET/KITS/TRAYS/PACK) ×1 IMPLANT
TRAY FOLEY MTR SLVR 16FR STAT (SET/KITS/TRAYS/PACK) ×1 IMPLANT
TUBING CONNECTING 10 (TUBING) ×3 IMPLANT
TUBING UROLOGY SET (TUBING) ×2 IMPLANT
WATER STERILE IRR 1000ML POUR (IV SOLUTION) ×1 IMPLANT

## 2018-06-04 NOTE — Op Note (Signed)
Preoperative diagnosis: Large (20.1 mm) right renal calculus  Postoperative diagnosis: Same  Principal procedure: Right percutaneous nephrolithotomy for large right renal calculus, use of fluoroscopy for less than 1 hour  Surgeon: Safire Gordin  Anesthesia: General endotracheal  Estimated blood loss: Less than 371 mL  Complications: None  Specimen: Stone fragments  Drains: 14 French Foley catheter  Indications: 72 year old female with history of recurrent urinary tract infections with eventual presentation with large right UPJ/renal stone in September of this year.  She had urgent stent placement.  She presents at this time for percutaneous management of the stone, with her having had a percutaneous tube placed 3 days ago by interventional radiology.  I discussed the procedure of percutaneous nephrolithotomy with the patient and her husband.  I discussed the procedure as well as risks and complications.  These include but are not limited to infection, bleeding, need for transfusion, renal injury, need for possible second look and anesthetic complications.  She understands these and desires to proceed.  Description of procedure: The patient was properly identified and marked in the holding area.  She received 2 g of Ancef intravenously.  She was taken to the operating room where general anesthetic was administered.  Bladder was drained with a 14 French Foley catheter, left indwelling.  She was then placed in the prone position.  All extremities and pressure points were appropriately padded.  Her right flank was prepped and draped around the indwelling percutaneous tube.  Proper timeout was performed.  Using fluoroscopic guidance, a guidewire was advanced down the ureter and into the bladder through the indwelling Kumpe catheter.  The Kumpe catheter was removed.  Over the working guidewire, a 10/12 peel-away access sheath was placed in the distal ureter.  The core was removed and a second safety  guidewire passed into the bladder.  At this point, the peel-away sheath was removed.  The nephrostomy balloon was then placed over top of the working guidewire up into the renal pelvis using fluoroscopic guidance.  Balloon was filled to 20 atm of pressure and left inflated for 2 minutes.  The access sheath was then placed over top of the balloon and into the renal pelvis.  The balloon was deflated and removed.  Both working and safety guidewires were left indwelling.  I then passed the rigid nephroscope.  The stone was easily encountered.  It was broken into multiple smaller fragments which were easily grasped and removed through the access sheath.  The nephroscope was advanced into the lower and upper pole calyces.  Small fragments were removed from these areas.  Additionally, the ureteropelvic junction was inspected and removed of debris.  Following this, the flexible nephroscope was then passed, and again all calyces were inspected.  No further stones were seen.  At this point, the scope was removed.  Since she did have an indwelling double-J stent, I felt that the procedure could be done in a "tubeless" approach.  Distance between the parenchymal edge and the skin surface was measured.  I then placed FloSeal in this tract, placing the FloSeal from the edge of the parenchyma all the way to the skin edge as I removed the nephrostomy sheath.  At this point, both guidewires were removed as well.  There was adequate hemostasis.  I then closed the skin edges with 2 separate 2-0 silk sutures placed in a vertical mattress fashion.  Dry sterile dressings were then placed.  At this point, the procedure was terminated.  The patient was placed in the  supine position, awakened, extubated, and then taken to the PACU in stable condition.  She tolerated the procedure well.

## 2018-06-04 NOTE — H&P (Signed)
H&P  Chief Complaint: Right kidney stone  History of Present Illness: Andrea Norton is a 72 y.o. year old female presenting for percutaneous management of a large right renal calculus.  The patient had been followed for some time with recurrent urinary tract infections, all with E. coli species.  She presented in September of this year with pyelonephritis and an obstructing/symptomatic large right renal pelvis 20.1 mm in size.  She was stented urgently and treated with IV antibiotics.  She had percutaneous tube placed 3 days ago in preparation for her percutaneous access with nephrolithotomy today.  She has been on Bactrim over the past week or so for recent positive urine culture.  Past Medical History:  Diagnosis Date  . Abnormal stress test 01-22-2009   LOW RISK ADENOSINE NUCLEAR STUDY W/ PROBABLE MILD APICAL THINNING BUT NO ISCHEMIA  . Anxiety   . Arthritis    HANDS, KNEES  . B12 deficiency anemia   . Bronchitis    hx of   . Chronic back pain   . Chronic bronchitis (HCC)   . Chronic neck pain   . Coronary artery disease 2005   STRESS TEST /, NOte Dr Kirtland Bouchard EPIC/ no cardiologist now--STATES HEART STENT PLACED 2010  . DDD (degenerative disc disease), cervical   . DDD (degenerative disc disease), lumbar   . Depression    states well controlled  . Diverticulitis of colon   . GERD (gastroesophageal reflux disease)   . H/O hiatal hernia   . Hemorrhoid   . Hemorrhoids   . History of urinary tract infection   . Hyperlipidemia   . Hypertension      PCP Dr Lysbeth Galas; pt currently on no medication for treatment   . Hypothyroidism   . Inguinal hernia, right    with ventral hernia  . Pneumonia    hx of walking pneumonia  . Radicular pain in left arm OCCASIONAL PAIN SHOOTING DOWN LEFT ARM SECAONDARY TO  CERVICAL DEGENERATION  . Sleep apnea    couldnt tolerate machine- states last sleep study " many years ago"  . Status post primary angioplasty with coronary stent    DRUG-ELUTING  STENT X1  TO PROXIMAL LAD  . Synovitis of knee HYPERTROPHIC RIGHT KNEE  . Urinary incontinence   . Vitamin D deficiency     Past Surgical History:  Procedure Laterality Date  . APPENDECTOMY  1993  . CATARACT EXTRACTION W/PHACO Right 05/06/2014   Procedure: CATARACT EXTRACTION PHACO AND INTRAOCULAR LENS PLACEMENT (IOC);  Surgeon: Loraine Leriche T. Nile Riggs, MD;  Location: [facility];  Service: Ophthalmology;  Laterality: Right;  CDE 6.41  . CATARACT EXTRACTION W/PHACO Left 05/20/2014   Procedure: CATARACT EXTRACTION PHACO AND INTRAOCULAR LENS PLACEMENT (IOC);  Surgeon: Loraine Leriche T. Nile Riggs, MD;  Location: [facility];  Service: Ophthalmology;  Laterality: Left;  CDE:4.42  . COLON SURGERY    . CORONARY ANGIOPLASTY  2004   DRUG-ELUTING STENT X1 TO PROXIMAL  LAD  . CYSTOSCOPY WITH STENT PLACEMENT Right 04/14/2018   Procedure: CYSTOSCOPY WITH STENT PLACEMENT;  Surgeon: Marcine Matar, MD;  Location: [facility];  Service: Urology;  Laterality: Right;  . EXCISIONAL TOTAL KNEE ARTHROPLASTY WITH ANTIBIOTIC SPACERS Right 09/14/2015   Procedure: RIGHT TOTAL KNEE RESSECTION ARTHROPLASTY WITH ANTIBIOTIC SPACERS;  Surgeon: Ollen Gross, MD;  Location: [facility];  Service: Orthopedics;  Laterality: Right;  . EYE SURGERY    . HEMICOLECTOMY FOR DIVERTICULITIS  2005  . HYSTEROSCOPY W/D&C  2002  . I&D KNEE WITH POLY EXCHANGE  02/01/2012  Procedure: IRRIGATION AND DEBRIDEMENT KNEE WITH POLY EXCHANGE;  Surgeon: Loanne Drilling, MD;  Location: [facility];  Service: Orthopedics;  Laterality: Right;  Right Knee Polyethlene Revision   . IR URETERAL STENT RIGHT NEW ACCESS W/O SEP NEPHROSTOMY CATH  06/01/2018  . IRRIGATION AND DEBRIDEMENT KNEE Right 03/10/2015   Procedure: IRRIGATION AND DEBRIDEMENT of right KNEE;  Surgeon: Ollen Gross, MD;  Location: [facility];  Service: Orthopedics;  Laterality: Right;  . JOINT REPLACEMENT     right knee   . KNEE ARTHROSCOPY  LEFT 1999   RIGHT 2004  . KNEE ARTHROSCOPY  08/03/2011   Procedure: ARTHROSCOPY KNEE;   Surgeon: Loanne Drilling;  Location:  SUR[facility]ERY CENTER;  Service: Orthopedics;  Laterality: Right;  . KNEE ARTHROSCOPY Right 12/17/2014   Procedure: RIGHT ARTHROSCOPY KNEE, SYNOVECTOMY;  Surgeon: Ollen Gross, MD;  Location: [facility];  Service: Orthopedics;  Laterality: Right;  . KNEE ARTHROSCOPY W/ DEBRIDEMENT  01-22-2010   SEPTIC KNEE  . KNEE CLOSED REDUCTION  05-07-2009  RIGHT KNEE   LEFT KNEE  10-01-2009  . LAPAROSCOPIC GASTRIC SLEEVE RESECTION N/A 07/21/2014   Procedure: LAPAROSCOPIC GASTRIC SLEEVE RESECTION, TAKEDOWN OF INCARCERATED VENTRAL HERNIA, ENTERAL LYSIS, UPPER ENDOSCOPY;  Surgeon: Valarie Merino, MD;  Location: [facility];  Service: General;  Laterality: N/A;  . LEFT FOOT SURG.  1990  . OPEN PATELLOFEMORAL RIGHT KNEE/ LATERAL RELEASE  05-04-2009  . QUADRICEPS TENDON REPAIR Right 06/02/2015   Procedure: RIGHT KNEE QUADRICEP TENDON REPAIR ;  Surgeon: Ollen Gross, MD;  Location: [facility];  Service: Orthopedics;  Laterality: Right;  . RIGHT KNEE I & D POLYTHYLENE REVISION  02-04-2010   KNEE INFECTED  . SYNOVECTOMY  08/03/2011   Procedure: SYNOVECTOMY;  Surgeon: Loanne Drilling;  Location:  SURGERY CENTER;  Service: Orthopedics;  Laterality: Right;  . TOTAL KNEE ARTHROPLASTY  LEFT  2005   RIGHT  03-13-2009  . TOTAL KNEE ARTHROPLASTY Right 11/23/2015   Procedure: RIGHT TOTAL KNEE ARTHROPLASTY REIMPLANTATION;  Surgeon: Ollen Gross, MD;  Location: [facility];  Service: Orthopedics;  Laterality: Right;  . TOTAL KNEE REVISION  RIGHT 09-30-2009   LEFT 11-25-2009  . TOTAL KNEE REVISION Right 02/20/2013   Procedure: RIGHT TOTAL KNEE ARTHROPLASTY REVISION VERSUS RESECTION ARTHROPLASTY;  Surgeon: Loanne Drilling, MD;  Location: [facility];  Service: Orthopedics;  Laterality: Right;  . TOTAL KNEE REVISION Right 04/02/2014   Procedure: RIGHT KNEE FEMORAL ARTHROPLASTY REVISION;  Surgeon: Loanne Drilling, MD;  Location: [facility];  Service: Orthopedics;  Laterality: Right;    Home  Medications:  Medications Prior to Admission  Medication Sig Dispense Refill  . acetaminophen (TYLENOL) 500 MG tablet Take 1,000 mg by mouth every 6 (six) hours as needed for moderate pain or headache.     Marland Kitchen aspirin EC 81 MG tablet Take 81 mg by mouth at bedtime.     . Cholecalciferol (VITAMIN D3) 2000 units TABS Take 2,000 Units by mouth daily.    . cyanocobalamin (,VITAMIN B-12,) 1000 MCG/ML injection Inject 1,000 mcg into the muscle every 30 (thirty) days.     Marland Kitchen escitalopram (LEXAPRO) 10 MG tablet Take 10 mg by mouth daily with lunch.     . gabapentin (NEURONTIN) 100 MG capsule Take 300 mg by mouth 2 (two) times daily.     Marland Kitchen levothyroxine (SYNTHROID, LEVOTHROID) 125 MCG tablet Take 125 mcg by mouth daily after lunch.     . meloxicam (MOBIC) 7.5 MG tablet Take 7.5 mg by mouth 2 (two) times  daily.     . methocarbamol (ROBAXIN) 500 MG tablet Take 1 tablet (500 mg total) by mouth every 6 (six) hours as needed for muscle spasms. (Patient not taking: Reported on 05/08/2018) 80 tablet 0  . oxybutynin (DITROPAN) 5 MG tablet Take 5 mg by mouth every 8 (eight) hours as needed for bladder spasms.   11  . pantoprazole (PROTONIX) 40 MG tablet Take 40 mg by mouth daily with lunch.     . simvastatin (ZOCOR) 40 MG tablet Take 40 mg by mouth daily with lunch.     . sulfamethoxazole-trimethoprim (BACTRIM DS,SEPTRA DS) 800-160 MG tablet Take 1 tablet by mouth 2 (two) times daily.    . Vitamin D, Ergocalciferol, (DRISDOL) 50000 units CAPS capsule Take 50,000 Units by mouth every Friday.       Allergies:  Allergies  Allergen Reactions  . Oxybutynin Anaphylaxis    Pt states it made her mouth swell   . Morphine And Related Other (See Comments)    Hallunications, confusion for 1 month    Family History  Problem Relation Age of Onset  . COPD Mother   . Cancer Father        lung    Social History:  reports that she quit smoking about 15 years ago. Her smoking use included cigarettes. She has a 30.00  pack-year smoking history. She has never used smokeless tobacco. She reports that she does not drink alcohol or use drugs.  ROS: A complete review of systems was performed.  All systems are negative except for pertinent findings as noted.  Physical Exam:  Vital signs in last 24 hours:   General:  Alert and oriented, No acute distress HEENT: Normocephalic, atraumatic Neck: No JVD or lymphadenopathy Cardiovascular: Regular rate and rhythm Lungs: Clear bilaterally Abdomen: Soft, nontender, nondistended, no abdominal masses Back: No CVA tenderness Extremities: No edema Neurologic: Grossly intact  Laboratory Data:  No results found for this or any previous visit (from the past 24 hour(s)). No results found for this or any previous visit (from the past 240 hour(s)). Creatinine: No results for input(s): CREATININE in the last 168 hours.  Radiologic Imaging: No results found.  Impression/Assessment:  Large (greater than 20 mm) right renal calculus, status post initial stenting, now with percutaneous tube  Plan:  Right percutaneous nephrolithotomy.  I discussed with the patient and her family procedure as well as risks and complications which include, but are not limited to infection, bleeding, anesthetic complications, need for blood transfusion, renal injury.  She understands these and desires to proceed.  Bertram Millard Avana Kreiser 06/04/2018, 7:31 AM  Bertram Millard. Taylie Helder MD

## 2018-06-04 NOTE — Anesthesia Postprocedure Evaluation (Signed)
Anesthesia Post Note  Patient: Andrea Norton  Procedure(s) Performed: NEPHROLITHOTOMY PERCUTANEOUS (Right )     Patient location during evaluation: PACU Anesthesia Type: General Level of consciousness: awake and alert, oriented and patient cooperative Pain management: pain level controlled Vital Signs Assessment: post-procedure vital signs reviewed and stable Respiratory status: spontaneous breathing, nonlabored ventilation and respiratory function stable Cardiovascular status: blood pressure returned to baseline and stable Postop Assessment: no apparent nausea or vomiting Anesthetic complications: no    Last Vitals:  Vitals:   06/04/18 1215 06/04/18 1245  BP: (!) 150/79 (!) 144/74  Pulse: 93 93  Resp: 18 18  Temp: 36.6 C (!) 36.4 C  SpO2: 90% 100%    Last Pain:  Vitals:   06/04/18 1310  TempSrc:   PainSc: 5                  Caydon Feasel,E. Tyeshia Cornforth

## 2018-06-04 NOTE — Discharge Instructions (Signed)

## 2018-06-04 NOTE — Transfer of Care (Signed)
Immediate Anesthesia Transfer of Care Note  Patient: Andrea Norton  Procedure(s) Performed: NEPHROLITHOTOMY PERCUTANEOUS (Right )  Patient Location: PACU  Anesthesia Type:General  Level of Consciousness: awake, alert  and oriented  Airway & Oxygen Therapy: Patient Spontanous Breathing and Patient connected to face mask oxygen  Post-op Assessment: Report given to RN and Post -op Vital signs reviewed and stable  Post vital signs: Reviewed and stable  Last Vitals:  Vitals Value Taken Time  BP    Temp    Pulse    Resp    SpO2      Last Pain:  Vitals:   06/04/18 0757  TempSrc:   PainSc: 0-No pain         Complications: No apparent anesthesia complications

## 2018-06-04 NOTE — Interval H&P Note (Signed)
History and Physical Interval Note:  06/04/2018 9:17 AM  Andrea Norton  has presented today for surgery, with the diagnosis of RIGHT RENAL STONE  The various methods of treatment have been discussed with the patient and family. After consideration of risks, benefits and other options for treatment, the patient has consented to  Procedure(s) with comments: NEPHROLITHOTOMY PERCUTANEOUS (Right) - 2.5 HRS HOLMIUM LASER APPLICATION (Right) as a surgical intervention .  The patient's history has been reviewed, patient examined, no change in status, stable for surgery.  I have reviewed the patient's chart and labs.  Questions were answered to the patient's satisfaction.     Bertram Millard Tinisha Etzkorn

## 2018-06-04 NOTE — Anesthesia Procedure Notes (Signed)
Procedure Name: Intubation Date/Time: 06/04/2018 9:32 AM Performed by: Iyad Deroo D, CRNA Pre-anesthesia Checklist: Patient identified, Emergency Drugs available, Suction available and Patient being monitored Patient Re-evaluated:Patient Re-evaluated prior to induction Oxygen Delivery Method: Circle system utilized Preoxygenation: Pre-oxygenation with 100% oxygen Induction Type: IV induction Ventilation: Mask ventilation without difficulty Laryngoscope Size: Mac and 3 Grade View: Grade I Tube type: Oral Number of attempts: 1 Airway Equipment and Method: Stylet Placement Confirmation: ETT inserted through vocal cords under direct vision,  positive ETCO2 and breath sounds checked- equal and bilateral Secured at: 21 cm Tube secured with: Tape Dental Injury: Teeth and Oropharynx as per pre-operative assessment

## 2018-06-04 NOTE — Anesthesia Preprocedure Evaluation (Addendum)
Anesthesia Evaluation  Patient identified by MRN, date of birth, ID band Patient awake    Reviewed: Allergy & Precautions, NPO status , Patient's Chart, lab work & pertinent test results  History of Anesthesia Complications Negative for: history of anesthetic complications  Airway Mallampati: II  TM Distance: >3 FB Neck ROM: Full    Dental  (+) Chipped, Dental Advisory Given   Pulmonary COPD, Recent URI , Resolved, former smoker (quit 2004),    breath sounds clear to auscultation       Cardiovascular hypertension (not on meds presently), (-) angina+ CAD and + Cardiac Stents   Rhythm:Regular Rate:Normal  9/19 ECHO: EF 50-55%, valves OK   Neuro/Psych negative neurological ROS     GI/Hepatic Neg liver ROS, GERD  Medicated and Controlled,S/p gastric sleeve   Endo/Other  Hypothyroidism Morbid obesity  Renal/GU negative Renal ROSstones     Musculoskeletal  (+) Arthritis , Osteoarthritis,    Abdominal (+) + obese,   Peds  Hematology negative hematology ROS (+)   Anesthesia Other Findings   Reproductive/Obstetrics                            Anesthesia Physical Anesthesia Plan  ASA: III  Anesthesia Plan: General   Post-op Pain Management:    Induction: Intravenous  PONV Risk Score and Plan: 3 and Ondansetron, Dexamethasone and Scopolamine patch - Pre-op  Airway Management Planned: Oral ETT  Additional Equipment:   Intra-op Plan:   Post-operative Plan: Extubation in OR  Informed Consent: I have reviewed the patients History and Physical, chart, labs and discussed the procedure including the risks, benefits and alternatives for the proposed anesthesia with the patient or authorized representative who has indicated his/her understanding and acceptance.   Dental advisory given  Plan Discussed with: CRNA and Surgeon  Anesthesia Plan Comments: (Plan routine monitors, GETA)         Anesthesia Quick Evaluation

## 2018-06-05 ENCOUNTER — Encounter (HOSPITAL_COMMUNITY): Payer: Self-pay | Admitting: Urology

## 2018-06-05 DIAGNOSIS — N200 Calculus of kidney: Secondary | ICD-10-CM | POA: Diagnosis not present

## 2018-06-05 LAB — HEMOGLOBIN AND HEMATOCRIT, BLOOD
HCT: 33.5 % — ABNORMAL LOW (ref 36.0–46.0)
Hemoglobin: 10.3 g/dL — ABNORMAL LOW (ref 12.0–15.0)

## 2018-06-05 MED ORDER — TRAMADOL HCL 50 MG PO TABS: 50.0000 mg | ORAL_TABLET | Freq: Four times a day (QID) | ORAL | 0 refills | Status: AC | PRN

## 2018-06-05 NOTE — Discharge Summary (Signed)
Physician Discharge Summary  Patient ID: Andrea Norton MRN: [id number] DOB/AGE: [birth year] 72 y.o.  Admit date: 06/04/2018 Discharge date: 06/05/2018  Admission Diagnoses:  Calculus of kidney  Discharge Diagnoses:  Principal Problem:   Calculus of kidney Active Problems:   Postoperative anemia due to acute blood loss   Past Medical History:  Diagnosis Date  . Abnormal stress test 01-22-2009   LOW RISK ADENOSINE NUCLEAR STUDY W/ PROBABLE MILD APICAL THINNING BUT NO ISCHEMIA  . Anxiety   . Arthritis    HANDS, KNEES  . B12 deficiency anemia   . Bronchitis    hx of   . Chronic back pain   . Chronic bronchitis (HCC)   . Chronic neck pain   . Coronary artery disease 2005   STRESS TEST /, NOte Dr Kirtland Bouchard EPIC/ no cardiologist now--STATES HEART STENT PLACED 2010  . DDD (degenerative disc disease), cervical   . DDD (degenerative disc disease), lumbar   . Depression    states well controlled  . Diverticulitis of colon   . GERD (gastroesophageal reflux disease)   . H/O hiatal hernia   . Hemorrhoid   . Hemorrhoids   . History of urinary tract infection   . Hyperlipidemia   . Hypertension      PCP Dr Lysbeth Galas; pt currently on no medication for treatment   . Hypothyroidism   . Inguinal hernia, right    with ventral hernia  . Pneumonia    hx of walking pneumonia  . Radicular pain in left arm OCCASIONAL PAIN SHOOTING DOWN LEFT ARM SECAONDARY TO  CERVICAL DEGENERATION  . Sleep apnea    couldnt tolerate machine- states last sleep study " many years ago"  . Status post primary angioplasty with coronary stent    DRUG-ELUTING STENT X1  TO PROXIMAL LAD  . Synovitis of knee HYPERTROPHIC RIGHT KNEE  . Urinary incontinence   . Vitamin D deficiency     Surgeries: Procedure(s): NEPHROLITHOTOMY PERCUTANEOUS on 06/04/2018   Consultants (if any):   Discharged Condition: Improved  Hospital Course: Andrea Norton is an 72 y.o. female who was admitted 06/04/2018 with a  diagnosis of Calculus of kidney and went to the operating room on 06/04/2018 and underwent the above named procedures.  She has done well post op without pain.  Her hgb was 10.3 on POD #1.   She is voiding well and her dressing is dry.   She was given perioperative antibiotics:  Anti-infectives (From admission, onward)   Start     Dose/Rate Route Frequency Ordered Stop   06/04/18 1800  sulfamethoxazole-trimethoprim (BACTRIM DS,SEPTRA DS) 800-160 MG per tablet 1 tablet     1 tablet Oral 2 times daily 06/04/18 1236     06/04/18 0920  ceFAZolin (ANCEF) 2-4 GM/100ML-% IVPB    Note to Pharmacy:  Augustine Radar   : cabinet override      06/04/18 0920 06/04/18 2129    .  She was given sequential compression devices  for DVT prophylaxis.  She benefited maximally from the hospital stay and there were no complications.    Recent vital signs:  Vitals:   06/04/18 2318 06/05/18 0612  BP: (!) 92/53 121/67  Pulse: 75 72  Resp: 20 20  Temp: 98 F (36.7 C) 98.5 F (36.9 C)  SpO2: 92% 94%    Recent laboratory studies:  Lab Results  Component Value Date   HGB 10.3 (L) 06/05/2018   HGB 12.2 06/01/2018   HGB 11.9 (L) 05/15/2018  Lab Results  Component Value Date   WBC 5.7 06/01/2018   PLT 361 06/01/2018   Lab Results  Component Value Date   INR 1.06 06/01/2018   Lab Results  Component Value Date   NA 143 05/15/2018   K 3.5 05/15/2018   CL 110 05/15/2018   CO2 27 05/15/2018   BUN 13 05/15/2018   CREATININE 0.80 05/15/2018   GLUCOSE 112 (H) 05/15/2018    Discharge Medications:   Allergies as of 06/05/2018      Reactions   Oxybutynin Anaphylaxis   Pt states it made her mouth swell    Morphine And Related Other (See Comments)   Hallunications, confusion for 1 month      Medication List    STOP taking these medications   cephALEXin 250 MG capsule Commonly known as:  KEFLEX   oxybutynin 5 MG tablet Commonly known as:  DITROPAN     TAKE these medications    acetaminophen 500 MG tablet Commonly known as:  TYLENOL Take 1,000 mg by mouth every 6 (six) hours as needed for moderate pain or headache.   aspirin EC 81 MG tablet Take 81 mg by mouth at bedtime.   cyanocobalamin 1000 MCG/ML injection Commonly known as:  (VITAMIN B-12) Inject 1,000 mcg into the muscle every 30 (thirty) days.   escitalopram 10 MG tablet Commonly known as:  LEXAPRO Take 10 mg by mouth daily with lunch.   gabapentin 100 MG capsule Commonly known as:  NEURONTIN Take 300 mg by mouth 2 (two) times daily.   levothyroxine 125 MCG tablet Commonly known as:  SYNTHROID, LEVOTHROID Take 125 mcg by mouth daily after lunch.   meloxicam 7.5 MG tablet Commonly known as:  MOBIC Take 7.5 mg by mouth 2 (two) times daily.   methocarbamol 500 MG tablet Commonly known as:  ROBAXIN Take 1 tablet (500 mg total) by mouth every 6 (six) hours as needed for muscle spasms.   pantoprazole 40 MG tablet Commonly known as:  PROTONIX Take 40 mg by mouth daily with lunch.   simvastatin 40 MG tablet Commonly known as:  ZOCOR Take 40 mg by mouth daily with lunch.   sulfamethoxazole-trimethoprim 800-160 MG tablet Commonly known as:  BACTRIM DS,SEPTRA DS Take 1 tablet by mouth 2 (two) times daily.   traMADol 50 MG tablet Commonly known as:  ULTRAM Take 1 tablet (50 mg total) by mouth every 6 (six) hours as needed.   Vitamin D (Ergocalciferol) 1.25 MG (50000 UT) Caps capsule Commonly known as:  DRISDOL Take 50,000 Units by mouth every Friday.   Vitamin D3 50 MCG (2000 UT) Tabs Take 2,000 Units by mouth daily.       Diagnostic Studies: Dg C-arm 1-60 Min-no Report  Result Date: 06/04/2018 Fluoroscopy was utilized by the requesting physician.  No radiographic interpretation.   Ir Ureteral Stent Right New Access W/o Sep Nephrostomy Cath  Result Date: 06/01/2018 INDICATION: 72 year old female with right-sided kidney stone presents for percutaneous nephrostomy and  nephroureteral tube for future lithotripsy EXAM: IR URETURAL STENT RIGHT NEW ACCESS W/O SEP NEPHROSTOMY CATH COMPARISON:  CT 04/13/2018 MEDICATIONS: 400 mg Cipro; The antibiotic was administered in an appropriate time frame prior to skin puncture. ANESTHESIA/SEDATION: Fentanyl 100 mcg IV; Versed 4.0 mg IV Moderate Sedation Time:  56 minutes The patient was continuously monitored during the procedure by the interventional radiology nurse under my direct supervision. CONTRAST:  50 cc-administered into the collecting system(s) FLUOROSCOPY TIME:  Fluoroscopy Time: 14 minutes 6 seconds (508  mGy). COMPLICATIONS: None PROCEDURE: Informed written consent was obtained from the patient after a thorough discussion of the procedural risks, benefits and alternatives. All questions were addressed. Maximal Sterile Barrier Technique was utilized including caps, mask, sterile gowns, sterile gloves, sterile drape, hand hygiene and skin antiseptic. A timeout was performed prior to the initiation of the procedure. Patient positioned prone position on the fluoroscopy table. Ultrasound survey of the right flank was performed with images stored and sent to PACs. The patient was then prepped and draped in the usual sterile fashion. 1% lidocaine was used to anesthetize the skin and subcutaneous tissues for local anesthesia. The Chiba needle was advanced under ultrasound guidance for attempt at initial placement with ultrasound puncture. We were unsuccessful locating the tip of the needle confidently within the collecting system, with partial obscuration secondary to infusion of contrast outside of the collecting system. We then used ultrasound guidance to puncture the stone/stent combination within the collecting system. We were able to infused contrast into the collecting system for a double puncture technique. Once the contrast in the collecting system opacified the collecting system, gas was infused to locate the most posterior calices  of the inferior collecting system. A posterior projecting gas collection was punctured. Wire was advanced down the ureter. Coaxial Accustick was then placed over the wire. The metal stiffener and inner dilator were removed. Bentson wire was passed into the ureter. 5 French pigtail catheter was navigated with the Bentson wire into the urinary bladder. Contrast infusion confirmed location within the urinary bladder. Patient tolerated the procedure well and remained hemodynamically stable throughout. No complications were encountered and no significant blood loss encountered IMPRESSION: Status post placement of right percutaneous nephroureteral drain via posterior inferior projecting calyx for a future lithotripsy access point. The 5 French catheter is located within the urinary bladder and will accept any 035 wire. Signed, Yvone NeuJaime S. Reyne DumasWagner, DO, RPVI [facility] Electronically Signed   By: Gilmer MorJaime  Wagner D.O.   On: 06/01/2018 13:51    Disposition: Discharge disposition: 01-Home or Self Care       Discharge Instructions    Discontinue IV   Complete by:  As directed       Follow-up Information    Marcine Matarahlstedt, Stephen, MD.   Specialty:  Urology Why:  11/26 @ 0915 Contact information: [address] [place] [address] [phone]            Signed: Bjorn PippinJohn Hether Anselmo 06/05/2018, 7:19 AM

## 2018-06-05 NOTE — Progress Notes (Signed)
Patient is stable for discharge. Discharge instructions and medications have been reviewed with the patient and all questions answered. AVS given to patient. Prescription for Tramadol to be sent electronically by Dr. Annabell HowellsWrenn to the patients pharmacy. Patient provided with gauze and tape to keep site where nephrostomy tube was removed clean and dry.

## 2018-06-19 ENCOUNTER — Other Ambulatory Visit (HOSPITAL_COMMUNITY)
Admission: RE | Admit: 2018-06-19 | Discharge: 2018-06-19 | Disposition: A | Discharge status: Home / Self Care | Payer: Medicare Other | Source: Ambulatory Visit | Attending: Urology | Admitting: Urology

## 2018-06-19 ENCOUNTER — Ambulatory Visit (INDEPENDENT_AMBULATORY_CARE_PROVIDER_SITE_OTHER): Payer: Medicare Other | Admitting: Urology

## 2018-06-19 DIAGNOSIS — N201 Calculus of ureter: Secondary | ICD-10-CM

## 2018-06-21 LAB — URINE CULTURE: Culture: NO GROWTH

## 2018-06-29 ENCOUNTER — Other Ambulatory Visit: Payer: Self-pay | Admitting: Urology

## 2018-06-29 DIAGNOSIS — N201 Calculus of ureter: Secondary | ICD-10-CM

## 2018-07-12 ENCOUNTER — Ambulatory Visit (HOSPITAL_COMMUNITY)
Admission: RE | Admit: 2018-07-12 | Discharge: 2018-07-12 | Disposition: A | Discharge status: Home / Self Care | Payer: Medicare Other | Source: Ambulatory Visit | Attending: Urology | Admitting: Urology

## 2018-07-12 DIAGNOSIS — N201 Calculus of ureter: Secondary | ICD-10-CM | POA: Diagnosis present

## 2018-07-31 ENCOUNTER — Ambulatory Visit (INDEPENDENT_AMBULATORY_CARE_PROVIDER_SITE_OTHER): Payer: Medicare HMO | Admitting: Urology

## 2018-07-31 DIAGNOSIS — N3020 Other chronic cystitis without hematuria: Secondary | ICD-10-CM

## 2018-07-31 DIAGNOSIS — N200 Calculus of kidney: Secondary | ICD-10-CM

## 2018-10-02 ENCOUNTER — Ambulatory Visit (INDEPENDENT_AMBULATORY_CARE_PROVIDER_SITE_OTHER): Payer: Medicare Other | Admitting: Urology

## 2018-10-02 DIAGNOSIS — N200 Calculus of kidney: Secondary | ICD-10-CM

## 2019-06-03 ENCOUNTER — Ambulatory Visit: Payer: Medicare Other | Admitting: Cardiology

## 2019-06-25 ENCOUNTER — Encounter: Payer: Self-pay | Admitting: Cardiology

## 2019-06-25 DIAGNOSIS — E785 Hyperlipidemia, unspecified: Secondary | ICD-10-CM | POA: Insufficient documentation

## 2019-06-25 DIAGNOSIS — Z7189 Other specified counseling: Secondary | ICD-10-CM | POA: Insufficient documentation

## 2019-06-25 NOTE — Progress Notes (Signed)
Cardiology Office Note   Date:  06/26/2019   ID:  Andrea CallanderGloria Oakley Ingwersen, DOB 12-27-1945, MRN [id number]  PCP:  Andrea Norton, Andrea C, MD  Cardiologist:   No primary care provider on file.   No chief complaint on file.     History of Present Illness: Andrea Norton is a 73 y.o. female who is referred by Andrea Norton, Andrea C, MD for evaluation of CAD   I saw her years ago and she does have a history of DES to the LAD in 2004.  She has not been seen in the office since 2010.    Echo in 2019 demonstrated NL LVEF.  There were no significant valvular abnormalities.  This was at the time of sepsis with a kidney stone and she had an elevated troponin.  I reviewed these records.  She presents now and says she has been doing okay.  She has to walk slowly with a walker because of knee problems.  She denies any cardiovascular symptoms however.  She has none of the symptoms she had at the time of her PCI.The patient denies any new symptoms such as chest discomfort, neck or arm discomfort. There has been no new shortness of breath, PND or orthopnea. There have been no reported palpitations, presyncope or syncope.    Past Medical History:  Diagnosis Date  . Abnormal stress test 01-22-2009   LOW RISK ADENOSINE NUCLEAR STUDY W/ PROBABLE MILD APICAL THINNING BUT NO ISCHEMIA  . Anxiety   . Arthritis    HANDS, KNEES  . B12 deficiency anemia   . Chronic back pain   . Chronic bronchitis (HCC)   . Chronic neck pain   . Coronary artery disease 2005   STRESS TEST /, NOte Dr Kirtland BouchardHocherin EPIC/ no cardiologist now--STATES HEART STENT PLACED 2010  . DDD (degenerative disc disease), cervical   . DDD (degenerative disc disease), lumbar   . Depression    states well controlled  . Diverticulitis of colon   . GERD (gastroesophageal reflux disease)   . H/O hiatal hernia   . Hemorrhoid   . Hyperlipidemia   . Hypertension      PCP Dr Lysbeth GalasNyland; pt currently on no medication for treatment   . Hypothyroidism   .  Inguinal hernia, right    with ventral hernia  . Pneumonia    hx of walking pneumonia  . Sleep apnea    couldnt tolerate machine- states last sleep study " many years ago"  . Status post primary angioplasty with coronary stent    DRUG-ELUTING STENT X1  TO PROXIMAL LAD  . Synovitis of knee HYPERTROPHIC RIGHT KNEE    Past Surgical History:  Procedure Laterality Date  . APPENDECTOMY  1993  . CATARACT EXTRACTION W/PHACO Right 05/06/2014   Procedure: CATARACT EXTRACTION PHACO AND INTRAOCULAR LENS PLACEMENT (IOC);  Surgeon: Andrea LericheMark T. Norton RiggsShapiro, MD;  Location: [facility];  Service: Ophthalmology;  Laterality: Right;  CDE 6.41  . CATARACT EXTRACTION W/PHACO Left 05/20/2014   Procedure: CATARACT EXTRACTION PHACO AND INTRAOCULAR LENS PLACEMENT (IOC);  Surgeon: Andrea LericheMark T. Norton RiggsShapiro, MD;  Location: [facility];  Service: Ophthalmology;  Laterality: Left;  CDE:4.42  . COLON SURGERY    . CORONARY ANGIOPLASTY  2004   DRUG-ELUTING STENT X1 TO PROXIMAL  LAD  . CYSTOSCOPY WITH STENT PLACEMENT Right 04/14/2018   Procedure: CYSTOSCOPY WITH STENT PLACEMENT;  Surgeon: Marcine Matarahlstedt, Stephen, MD;  Location: [facility];  Service: Urology;  Laterality: Right;  . EXCISIONAL TOTAL KNEE ARTHROPLASTY WITH ANTIBIOTIC SPACERS  Right 09/14/2015   Procedure: RIGHT TOTAL KNEE RESSECTION ARTHROPLASTY WITH ANTIBIOTIC SPACERS;  Surgeon: Ollen Gross, MD;  [facility];  Service: Orthopedics;  Laterality: Right;  . EYE SURGERY    . HEMICOLECTOMY FOR DIVERTICULITIS  2005  . HYSTEROSCOPY W/D&Norton  2002  . I&D KNEE WITH POLY EXCHANGE  02/01/2012   Procedure: IRRIGATION AND DEBRIDEMENT KNEE WITH POLY EXCHANGE;  Surgeon: Andrea Drilling, MD;  [facility];  Service: Orthopedics;  Laterality: Right;  Right Knee Polyethlene Revision   . IR URETERAL STENT RIGHT NEW ACCESS W/O SEP NEPHROSTOMY CATH  06/01/2018  . IRRIGATION AND DEBRIDEMENT KNEE Right 03/10/2015   Procedure: IRRIGATION AND DEBRIDEMENT of right KNEE;  Surgeon: Ollen Gross, MD;  [facility];  Service: Orthopedics;  Laterality: Right;  . JOINT REPLACEMENT     right knee   . KNEE ARTHROSCOPY  LEFT 1999   RIGHT 2004  . KNEE ARTHROSCOPY  08/03/2011   Procedure: ARTHROSCOPY KNEE;  Surgeon: Andrea Norton;  Location: [facility];  Service: Orthopedics;  Laterality: Right;  . KNEE ARTHROSCOPY Right 12/17/2014   Procedure: RIGHT ARTHROSCOPY KNEE, SYNOVECTOMY;  Surgeon: Ollen Gross, MD;  [facility];  Service: Orthopedics;  Laterality: Right;  . KNEE ARTHROSCOPY W/ DEBRIDEMENT  01-22-2010   SEPTIC KNEE  . KNEE CLOSED REDUCTION  05-07-2009  RIGHT KNEE   LEFT KNEE  10-01-2009  . LAPAROSCOPIC GASTRIC SLEEVE RESECTION N/A 07/21/2014   Procedure: LAPAROSCOPIC GASTRIC SLEEVE RESECTION, TAKEDOWN OF INCARCERATED VENTRAL HERNIA, ENTERAL LYSIS, UPPER ENDOSCOPY;  Surgeon: Valarie Merino, MD;  [facility];  Service: General;  Laterality: N/A;  . LEFT FOOT SURG.  1990  . NEPHROLITHOTOMY Right 06/04/2018   Procedure: NEPHROLITHOTOMY PERCUTANEOUS;  Surgeon: Marcine Matar, MD;  [facility];  Service: Urology;  Laterality: Right;  2.5 HRS  . OPEN PATELLOFEMORAL RIGHT KNEE/ LATERAL RELEASE  05-04-2009  . QUADRICEPS TENDON REPAIR Right 06/02/2015   Procedure: RIGHT KNEE QUADRICEP TENDON REPAIR ;  Surgeon: Ollen Gross, MD;  [facility];  Service: Orthopedics;  Laterality: Right;  . RIGHT KNEE I & D POLYTHYLENE REVISION  02-04-2010   KNEE INFECTED  . SYNOVECTOMY  08/03/2011   Procedure: SYNOVECTOMY;  Surgeon: Andrea Norton;  Location: [facility];  Service: Orthopedics;  Laterality: Right;  . TOTAL KNEE ARTHROPLASTY  LEFT  2005   RIGHT  03-13-2009  . TOTAL KNEE ARTHROPLASTY Right 11/23/2015   Procedure: RIGHT TOTAL KNEE ARTHROPLASTY REIMPLANTATION;  Surgeon: Ollen Gross, MD;  [facility];  Service: Orthopedics;  Laterality: Right;  . TOTAL KNEE REVISION  RIGHT 09-30-2009   LEFT 11-25-2009  . TOTAL KNEE REVISION Right 02/20/2013    Procedure: RIGHT TOTAL KNEE ARTHROPLASTY REVISION VERSUS RESECTION ARTHROPLASTY;  Surgeon: Andrea Drilling, MD;  [facility];  Service: Orthopedics;  Laterality: Right;  . TOTAL KNEE REVISION Right 04/02/2014   Procedure: RIGHT KNEE FEMORAL ARTHROPLASTY REVISION;  Surgeon: Andrea Drilling, MD;  [facility];  Service: Orthopedics;  Laterality: Right;     Current Outpatient Medications  Medication Sig Dispense Refill  . acetaminophen (TYLENOL) 500 MG tablet Take 1,000 mg by mouth every 6 (six) hours as needed for moderate pain or headache.     Marland Kitchen aspirin EC 81 MG tablet Take 81 mg by mouth at bedtime.     . Cholecalciferol (VITAMIN D3) 2000 units TABS Take 2,000 Units by mouth daily.    . cyanocobalamin (,VITAMIN B-12,) 1000 MCG/ML injection Inject  1,000 mcg into the muscle every 30 (thirty) days.     Marland Kitchen escitalopram (LEXAPRO) 10 MG tablet Take 15 mg by mouth daily with lunch.     . gabapentin (NEURONTIN) 100 MG capsule Take 300 mg by mouth 2 (two) times daily.     Marland Kitchen levothyroxine (SYNTHROID, LEVOTHROID) 125 MCG tablet Take 125 mcg by mouth daily after lunch.     . meloxicam (MOBIC) 7.5 MG tablet Take 7.5 mg by mouth 2 (two) times daily.     . pantoprazole (PROTONIX) 40 MG tablet Take 40 mg by mouth daily with lunch.     . traMADol (ULTRAM) 50 MG tablet Take 1 tablet (50 mg total) by mouth every 6 (six) hours as needed. 15 tablet 0  . Vitamin D, Ergocalciferol, (DRISDOL) 50000 units CAPS capsule Take 50,000 Units by mouth every Friday.      No current facility-administered medications for this visit.    Facility-Administered Medications Ordered in Other Visits  Medication Dose Route Frequency Provider Last Rate Last Dose  . tranexamic acid (CYKLOKAPRON) 2,000 mg in sodium chloride 0.9 % 50 mL Topical Application  2,000 mg Topical Once [place], [facility], [facility]-Norton        Allergies:   Oxybutynin and Morphine and related    Social History:  The patient  reports that she quit smoking about 16  years ago. Her smoking use included cigarettes. She has a 30.00 pack-year smoking history. She has never used smokeless tobacco. She reports that she does not drink alcohol or use drugs.   Family History:  The patient's family history includes COPD in her mother; Cancer in her father.    ROS:  Please see the history of present illness.   Otherwise, review of systems are positive for none.   All other systems are reviewed and negative.    PHYSICAL EXAM: VS:  BP 120/80   Pulse 69   Ht 5' 4.5" (1.638 m)   Wt 225 lb (102.1 kg)   BMI 38.02 kg/m  , BMI Body mass index is 38.02 kg/m. GENERAL:  Well appearing HEENT:  Pupils equal round and reactive, fundi not visualized, oral mucosa unremarkable NECK:  No jugular venous distention, waveform within normal limits, carotid upstroke brisk and symmetric, no bruits, no thyromegaly LYMPHATICS:  No cervical, inguinal adenopathy LUNGS:  Clear to auscultation bilaterally BACK:  No CVA tenderness CHEST:  Unremarkable HEART:  PMI not displaced or sustained,S1 and S2 within normal limits, no S3, no S4, no clicks, no rubs, no murmurs ABD:  Flat, positive bowel sounds normal in frequency in pitch, no bruits, no rebound, no guarding, no midline pulsatile mass, no hepatomegaly, no splenomegaly EXT:  2 plus pulses throughout, no edema, no cyanosis no clubbing SKIN:  No rashes no nodules NEURO:  Cranial nerves II through XII grossly intact, motor grossly intact throughout PSYCH:  Cognitively intact, oriented to person place and time    EKG:  EKG is ordered today. The ekg ordered today demonstrates sinus rhythm, rate 69, axis within normal limits, intervals within normal limits, premature atrial contractions, nonspecific inferior T wave flattening.   Recent Labs: No results found for requested labs within last 8760 hours.    Lipid Panel No results found for: CHOL, TRIG, HDL, CHOLHDL, VLDL, LDLCALC, LDLDIRECT    Wt Readings from Last 3 Encounters:   06/26/19 225 lb (102.1 kg)  06/04/18 236 lb 8 oz (107.3 kg)  05/15/18 236 lb 8 oz (107.3 kg)      Other  studies Reviewed: Additional studies/ records that were reviewed today include: Labs. Review of the above records demonstrates:  Please see elsewhere in the note.     ASSESSMENT AND PLAN:  CAD:   The patient has no new sypmtoms.  No further cardiovascular testing is indicated.  We will continue with aggressive risk reduction and meds as listed.  No further testing is indicated.  She needs continued secondary risk reduction.  HTN : Her blood pressures well controlled.  No change in therapy.  DYSLIPIDEMIA: Her LDL was 138.  However, she was just started on a statin by her primary provider.  I will defer to his management.  COVID EDUCATION: We talked about avoiding the virus.  We talked about the vaccine as well.   Current medicines are reviewed at length with the patient today.  The patient does not have concerns regarding medicines.  The following changes have been made:  no change  Labs/ tests ordered today include:   Orders Placed This Encounter  Procedures  . EKG 12-Lead     Disposition:   FU with me in two years.     Signed, Minus Breeding, MD  06/26/2019 4:29 PM    [facility]

## 2019-06-26 ENCOUNTER — Other Ambulatory Visit: Payer: Self-pay

## 2019-06-26 ENCOUNTER — Encounter: Payer: Self-pay | Admitting: Cardiology

## 2019-06-26 ENCOUNTER — Ambulatory Visit (INDEPENDENT_AMBULATORY_CARE_PROVIDER_SITE_OTHER): Payer: Medicare Other | Admitting: Cardiology

## 2019-06-26 VITALS — BP 120/80 | HR 69 | Ht 64.5 in | Wt 225.0 lb

## 2019-06-26 DIAGNOSIS — I2510 Atherosclerotic heart disease of native coronary artery without angina pectoris: Secondary | ICD-10-CM | POA: Diagnosis not present

## 2019-06-26 DIAGNOSIS — E785 Hyperlipidemia, unspecified: Secondary | ICD-10-CM

## 2019-06-26 DIAGNOSIS — I10 Essential (primary) hypertension: Secondary | ICD-10-CM | POA: Diagnosis not present

## 2019-06-26 DIAGNOSIS — Z7189 Other specified counseling: Secondary | ICD-10-CM | POA: Diagnosis not present

## 2019-06-26 NOTE — Patient Instructions (Signed)
Medication Instructions:  The current medical regimen is effective;  continue present plan and medications.  *If you need a refill on your cardiac medications before your next appointment, please call your pharmacy*  Follow-Up: At [facility], you and your health needs are our priority.  As part of our continuing mission to provide you with exceptional heart care, we have created designated Provider Care Teams.  These Care Teams include your primary Cardiologist (physician) and Advanced Practice Providers (APPs -  Physician Assistants and Nurse Practitioners) who all work together to provide you with the care you need, when you need it.  Your next appointment:   2 year(s)  The format for your next appointment:   In Person  Provider:   James Hochrein, MD  Thank you for choosing [facility]!!     

## 2019-07-03 ENCOUNTER — Ambulatory Visit: Payer: Medicare Other | Admitting: Cardiology

## 2019-07-30 ENCOUNTER — Other Ambulatory Visit: Payer: Self-pay

## 2019-07-30 DIAGNOSIS — N201 Calculus of ureter: Secondary | ICD-10-CM

## 2019-09-13 ENCOUNTER — Ambulatory Visit (HOSPITAL_COMMUNITY)
Admission: RE | Admit: 2019-09-13 | Discharge: 2019-09-13 | Disposition: A | Discharge status: Home / Self Care | Payer: Medicare Other | Source: Ambulatory Visit | Attending: Urology | Admitting: Urology

## 2019-09-13 ENCOUNTER — Other Ambulatory Visit: Payer: Self-pay

## 2019-09-13 DIAGNOSIS — N201 Calculus of ureter: Secondary | ICD-10-CM | POA: Diagnosis not present

## 2019-09-16 ENCOUNTER — Telehealth: Payer: Self-pay

## 2019-09-16 NOTE — Telephone Encounter (Signed)
-----   Message from Marcine Matar, MD sent at 09/16/2019 12:10 PM EST -----  Please let patient know that only 1 small stone was in each kidney.  No blockage.  It has been a while since she has seen me.  She needs office visit. ----- Message ----- From: Ferdinand Lango, RN Sent: 09/16/2019   9:28 AM EST To: Marcine Matar, MD  Please review

## 2019-09-16 NOTE — Telephone Encounter (Signed)
Pt notified. Pt reports repeat UTI with no success from PCP treatment. Scheduled for 09/24/2019

## 2019-09-24 ENCOUNTER — Other Ambulatory Visit: Payer: Self-pay

## 2019-09-24 ENCOUNTER — Other Ambulatory Visit (HOSPITAL_COMMUNITY)
Admission: AD | Admit: 2019-09-24 | Discharge: 2019-09-24 | Disposition: A | Discharge status: Home / Self Care | Payer: Medicare Other | Source: Ambulatory Visit | Attending: Urology | Admitting: Urology

## 2019-09-24 ENCOUNTER — Encounter: Payer: Self-pay | Admitting: Urology

## 2019-09-24 ENCOUNTER — Ambulatory Visit (INDEPENDENT_AMBULATORY_CARE_PROVIDER_SITE_OTHER): Payer: Medicare Other | Admitting: Urology

## 2019-09-24 VITALS — BP 111/74 | HR 80 | Temp 97.7°F | Ht 64.5 in | Wt 225.0 lb

## 2019-09-24 DIAGNOSIS — N200 Calculus of kidney: Secondary | ICD-10-CM | POA: Insufficient documentation

## 2019-09-24 DIAGNOSIS — N201 Calculus of ureter: Secondary | ICD-10-CM

## 2019-09-24 LAB — POCT URINALYSIS DIPSTICK
Glucose, UA: NEGATIVE
Ketones, UA: NEGATIVE
Nitrite, UA: POSITIVE
Protein, UA: POSITIVE — AB
Spec Grav, UA: >=1.03 — AB (ref 1.010–1.025)
Urobilinogen, UA: NEGATIVE E.U./dL — AB
pH, UA: 6.5 (ref 5.0–8.0)

## 2019-09-24 NOTE — Progress Notes (Signed)
Urological Symptom Review  Patient is experiencing the following symptoms: Hard to postpone urination Burning/pain with urination Get up at night to urinate Leakage of urine Blood in urine Urinary tract infection   Review of Systems  Gastrointestinal (upper)  : Negative for upper GI symptoms  Gastrointestinal (lower) : Negative for lower GI symptoms  Constitutional : Negative for symptoms  Skin: Negative for skin symptoms  Eyes: Blurred vision  Ear/Nose/Throat : Sinus problems  Hematologic/Lymphatic: Easy bruising  Cardiovascular : Negative for cardiovascular symptoms  Respiratory : Shortness of breath  Endocrine: Negative for endocrine symptoms  Musculoskeletal: Back pain Joint pain  Neurological: Negative for neurological symptoms  Psychologic: Depression

## 2019-09-24 NOTE — Progress Notes (Signed)
H&P  Chief Complaint: Ureteral Calculi  History of Present Illness: Andrea Norton is a 74 y.o. year old female  3.2.2021: She returns today for follow-up. In the interval she has continued to have recurrent urinary tract infections w/ dysuria, bladder pressure, and increased urgency/freq. She feels as though she is currently infected -- she was started on nitrofurantoin but she feels like this was not effective. She has had her rx changed yet. Most recent scan revealed bilateral renal calculi (these were not present on her previous scan). Recent 24 hr urine revealed low citrate -- she is not currently on medication for this.   (below copied from [facility] records):  Ureteral Stones:  Andrea Norton is a 74 year-old female established patient who is here for ureteral calculi after a surgical intervention.  This procedure was done 04/13/2018.   10.8.19: She underwent urgent stenting for infected, obstructing right renal pelvic stone on 9.20.2019. She had postoperative hospitalization for IV antibiotic administration. She has completed her oral antibiotics.  Stone was 20.1 mm by 13 mm.   11.11.2019: PCNL. Stent was left in, she did not have a nephrostomy tube.   11.26.2019: She is here for follow-up. She has some discomfort with urination from the stent, no fever, no chills. Occasional gross hematuria. No flank pain.   2018/08/03: Her husband died last week. She was recently treated for an infection as well. She is having no flank pain or hematuria. Recent renal ultrasound revealed no hydronephrosis or stone disease.   3.10.2020: Here to discuss 24 hr urine results. She did have slightly low urine volume, borderline high urine calcium, very low urine citrate. She has had no recent flank pain, hematuria, voiding issues.   Past Medical History:  Diagnosis Date  . Abnormal stress test 01-22-2009   LOW RISK ADENOSINE NUCLEAR STUDY W/ PROBABLE MILD APICAL THINNING BUT NO ISCHEMIA  . Anxiety   .  Arthritis    HANDS, KNEES  . B12 deficiency anemia   . Chronic back pain   . Chronic bronchitis (HCC)   . Chronic neck pain   . Coronary artery disease 2005   STRESS TEST /, NOte Dr Kirtland Bouchard EPIC/ no cardiologist now--STATES HEART STENT PLACED 2010  . DDD (degenerative disc disease), cervical   . DDD (degenerative disc disease), lumbar   . Depression    states well controlled  . Diverticulitis of colon   . GERD (gastroesophageal reflux disease)   . H/O hiatal hernia   . Hemorrhoid   . Hyperlipidemia   . Hypertension      PCP Dr Lysbeth Galas; pt currently on no medication for treatment   . Hypothyroidism   . Inguinal hernia, right    with ventral hernia  . Pneumonia    hx of walking pneumonia  . Sleep apnea    couldnt tolerate machine- states last sleep study " many years ago"  . Status post primary angioplasty with coronary stent    DRUG-ELUTING STENT X1  TO PROXIMAL LAD  . Synovitis of knee HYPERTROPHIC RIGHT KNEE    Past Surgical History:  Procedure Laterality Date  . APPENDECTOMY  1993  . CATARACT EXTRACTION W/PHACO Right 05/06/2014   Procedure: CATARACT EXTRACTION PHACO AND INTRAOCULAR LENS PLACEMENT (IOC);  Surgeon: Loraine Leriche T. Nile Riggs, MD;  Location: [facility];  Service: Ophthalmology;  Laterality: Right;  CDE 6.41  . CATARACT EXTRACTION W/PHACO Left 05/20/2014   Procedure: CATARACT EXTRACTION PHACO AND INTRAOCULAR LENS PLACEMENT (IOC);  Surgeon: Loraine Leriche T. Nile Riggs, MD;  [facility];  Service: Ophthalmology;  Laterality: Left;  CDE:4.42  . COLON SURGERY    . CORONARY ANGIOPLASTY  2004   DRUG-ELUTING STENT X1 TO PROXIMAL  LAD  . CYSTOSCOPY WITH STENT PLACEMENT Right 04/14/2018   Procedure: CYSTOSCOPY WITH STENT PLACEMENT;  Surgeon: Franchot Gallo, MD;  [facility];  Service: Urology;  Laterality: Right;  . EXCISIONAL TOTAL KNEE ARTHROPLASTY WITH ANTIBIOTIC SPACERS Right 09/14/2015   Procedure: RIGHT TOTAL KNEE RESSECTION ARTHROPLASTY WITH ANTIBIOTIC SPACERS;  Surgeon:  Gaynelle Arabian, MD;  [facility];  Service: Orthopedics;  Laterality: Right;  . EYE SURGERY    . HEMICOLECTOMY FOR DIVERTICULITIS  2005  . HYSTEROSCOPY WITH D & C  2002  . I & D KNEE WITH POLY EXCHANGE  02/01/2012   Procedure: IRRIGATION AND DEBRIDEMENT KNEE WITH POLY EXCHANGE;  Surgeon: Gearlean Alf, MD;  [facility];  Service: Orthopedics;  Laterality: Right;  Right Knee Polyethlene Revision   . IR URETERAL STENT RIGHT NEW ACCESS W/O SEP NEPHROSTOMY CATH  06/01/2018  . IRRIGATION AND DEBRIDEMENT KNEE Right 03/10/2015   Procedure: IRRIGATION AND DEBRIDEMENT of right KNEE;  Surgeon: Gaynelle Arabian, MD;  [facility];  Service: Orthopedics;  Laterality: Right;  . JOINT REPLACEMENT     right knee   . KNEE ARTHROSCOPY  LEFT 1999   RIGHT 2004  . KNEE ARTHROSCOPY  08/03/2011   Procedure: ARTHROSCOPY KNEE;  Surgeon: Gearlean Alf;  Location: [place];  Service: Orthopedics;  Laterality: Right;  . KNEE ARTHROSCOPY Right 12/17/2014   Procedure: RIGHT ARTHROSCOPY KNEE, SYNOVECTOMY;  Surgeon: Gaynelle Arabian, MD;  [facility];  Service: Orthopedics;  Laterality: Right;  . KNEE ARTHROSCOPY W/ DEBRIDEMENT  01-22-2010   SEPTIC KNEE  . KNEE CLOSED REDUCTION  05-07-2009  RIGHT KNEE   LEFT KNEE  10-01-2009  . LAPAROSCOPIC GASTRIC SLEEVE RESECTION N/A 07/21/2014   Procedure: LAPAROSCOPIC GASTRIC SLEEVE RESECTION, TAKEDOWN OF INCARCERATED VENTRAL HERNIA, ENTERAL LYSIS, UPPER ENDOSCOPY;  Surgeon: Pedro Earls, MD;  [facility];  Service: General;  Laterality: N/A;  . LEFT FOOT SURG.  1990  . NEPHROLITHOTOMY Right 06/04/2018   Procedure: NEPHROLITHOTOMY PERCUTANEOUS;  Surgeon: Franchot Gallo, MD;  [facility];  Service: Urology;  Laterality: Right;  2.5 HRS  . OPEN PATELLOFEMORAL RIGHT KNEE/ LATERAL RELEASE  05-04-2009  . QUADRICEPS TENDON REPAIR Right 06/02/2015   Procedure: RIGHT KNEE QUADRICEP TENDON REPAIR ;  Surgeon: Gaynelle Arabian, MD;  [facility];   Service: Orthopedics;  Laterality: Right;  . RIGHT KNEE I & D POLYTHYLENE REVISION  02-04-2010   KNEE INFECTED  . SYNOVECTOMY  08/03/2011   Procedure: SYNOVECTOMY;  Surgeon: Gearlean Alf;  Location: [place];  Service: Orthopedics;  Laterality: Right;  . TOTAL KNEE ARTHROPLASTY  LEFT  2005   RIGHT  03-13-2009  . TOTAL KNEE ARTHROPLASTY Right 11/23/2015   Procedure: RIGHT TOTAL KNEE ARTHROPLASTY REIMPLANTATION;  Surgeon: Gaynelle Arabian, MD;  [facility];  Service: Orthopedics;  Laterality: Right;  . TOTAL KNEE REVISION  RIGHT 09-30-2009   LEFT 11-25-2009  . TOTAL KNEE REVISION Right 02/20/2013   Procedure: RIGHT TOTAL KNEE ARTHROPLASTY REVISION VERSUS RESECTION ARTHROPLASTY;  Surgeon: Gearlean Alf, MD;  [facility];  Service: Orthopedics;  Laterality: Right;  . TOTAL KNEE REVISION Right 04/02/2014   Procedure: RIGHT KNEE FEMORAL ARTHROPLASTY REVISION;  Surgeon: Gearlean Alf, MD;  [facility];  Service: Orthopedics;  Laterality: Right;    Home Medications:  (  Not in a hospital admission)   Allergies:  Allergies  Allergen Reactions  . Oxybutynin Anaphylaxis    Pt states it made her mouth swell   . Morphine And Related Other (See Comments)    Hallunications, confusion for 1 month    Family History  Problem Relation Age of Onset  . COPD Mother   . Cancer Father        lung    Social History:  reports that she quit smoking about 17 years ago. Her smoking use included cigarettes. She has a 30.00 pack-year smoking history. She has never used smokeless tobacco. She reports that she does not drink alcohol or use drugs.  ROS: A complete review of systems was performed.  All systems are negative except for pertinent findings as noted.  Physical Exam:  Vital signs in last 24 hours: Temp:  [97.7 F (36.5 C)] 97.7 F (36.5 C) (03/02 1520) Pulse Rate:  [80] 80 (03/02 1520) BP: (111)/(74) 111/74 (03/02 1520) Weight:  [225 lb (102.1 kg)] 225 lb (102.1  kg) (03/02 1520) General:  Alert and oriented, No acute distress HEENT: Normocephalic, atraumatic Neck: No JVD or lymphadenopathy Cardiovascular: Regular rate and rhythm Lungs: Clear bilaterally Abdomen: Soft, nontender, nondistended, no abdominal masses Back: No CVA tenderness Extremities: No edema Neurologic: Grossly intact  Laboratory Data:  Results for orders placed or performed in visit on 09/24/19 (from the past 24 hour(s))  POCT urinalysis dipstick     Status: Abnormal   Collection Time: 09/24/19  3:25 PM  Result Value Ref Range   Color, UA orange    Clarity, UA cloudy    Glucose, UA Negative Negative   Bilirubin, UA small    Ketones, UA neg    Spec Grav, UA >=1.030 (A) 1.010 - 1.025   Blood, UA large    pH, UA 6.5 5.0 - 8.0   Protein, UA Positive (A) Negative   Urobilinogen, UA negative (A) 0.2 or 1.0 E.U./dL   Nitrite, UA positive    Leukocytes, UA Small (1+) (A) Negative   Appearance cloudy    Odor      I have reviewed prior pt notes  I have reviewed notes from referring/previous physicians  I have reviewed urinalysis results  I have independently reviewed prior imaging    Impression/Assessment:  We once again discussed reslts of recent scans and 24 hr urine. She does have bilateral renal calculi that are new since her previous imaging (both 6 mm). Her urine citrate is also quite low but rather than start her on medication, I think this can be managed with adding more citrus to her liquid diet. We will continue to monitor her.  UA does indicate possible persistent infection -- will culture and send in appropriate abx if needed.  Plan:  1. Advised to drink at least 2 crystal lights daily (or to add lime/lemon juice to water).   2. Return in 6 mo's for OV w/ RUS and KUB. If her stones have continued to grow, we will discuss preemptive treatment with ESWL.   3. Urine culture -- will send in appropriate antibiotic if needed.   Luella Cook 09/24/2019,  3:36 PM  [facility]ram Millard. Anneka Studer MD

## 2019-09-26 LAB — URINE CULTURE: Culture: >=100000 — AB

## 2019-09-30 ENCOUNTER — Telehealth: Payer: Self-pay

## 2019-09-30 ENCOUNTER — Other Ambulatory Visit: Payer: Self-pay | Admitting: Urology

## 2019-09-30 DIAGNOSIS — N3000 Acute cystitis without hematuria: Secondary | ICD-10-CM

## 2019-09-30 MED ORDER — SULFAMETHOXAZOLE-TRIMETHOPRIM 800-160 MG PO TABS: 1.0000 | ORAL_TABLET | Freq: Two times a day (BID) | ORAL | 0 refills | Status: DC

## 2019-09-30 NOTE — Telephone Encounter (Signed)
Pt called and notified

## 2019-09-30 NOTE — Telephone Encounter (Signed)
-----   Message from Marcine Matar, MD sent at 09/30/2019 10:25 AM EST ----- Let pt know that culture was + and abx sent in ----- Message ----- From: Ferdinand Lango, RN Sent: 09/27/2019  11:53 AM EST To: Marcine Matar, MD  Urine culture

## 2019-11-05 ENCOUNTER — Telehealth: Payer: Self-pay | Admitting: Urology

## 2019-11-05 NOTE — Telephone Encounter (Signed)
Pt scheduled next Tuesday at 3:45. Pt voiced understanding of appt

## 2019-11-05 NOTE — Telephone Encounter (Signed)
Pt called to make an appt regarding her UTI, I made an appt for next available on May 25th. She says she didn't want to wait that long anf requested a phone call from either Dr. Retta Diones or a nurse.

## 2019-11-12 ENCOUNTER — Ambulatory Visit (INDEPENDENT_AMBULATORY_CARE_PROVIDER_SITE_OTHER): Payer: Medicare Other | Admitting: Urology

## 2019-11-12 ENCOUNTER — Other Ambulatory Visit (HOSPITAL_COMMUNITY)
Admission: RE | Admit: 2019-11-12 | Discharge: 2019-11-12 | Disposition: A | Discharge status: Home / Self Care | Payer: Medicare Other | Source: Ambulatory Visit | Attending: Urology | Admitting: Urology

## 2019-11-12 ENCOUNTER — Ambulatory Visit: Payer: Medicare Other | Admitting: Urology

## 2019-11-12 ENCOUNTER — Other Ambulatory Visit: Payer: Self-pay

## 2019-11-12 ENCOUNTER — Encounter: Payer: Self-pay | Admitting: Urology

## 2019-11-12 VITALS — BP 117/78 | HR 68 | Temp 97.5°F

## 2019-11-12 DIAGNOSIS — N3000 Acute cystitis without hematuria: Secondary | ICD-10-CM

## 2019-11-12 DIAGNOSIS — N201 Calculus of ureter: Secondary | ICD-10-CM

## 2019-11-12 LAB — POCT URINALYSIS DIPSTICK
Bilirubin, UA: NEGATIVE
Glucose, UA: NEGATIVE
Ketones, UA: NEGATIVE
Nitrite, UA: NEGATIVE
Protein, UA: POSITIVE — AB
Spec Grav, UA: 1.02 (ref 1.010–1.025)
Urobilinogen, UA: 0.2 E.U./dL
pH, UA: 6.5 (ref 5.0–8.0)

## 2019-11-12 MED ORDER — NITROFURANTOIN MONOHYD MACRO 100 MG PO CAPS: 100.0000 mg | ORAL_CAPSULE | Freq: Two times a day (BID) | ORAL | 1 refills | Status: AC

## 2019-11-12 NOTE — Progress Notes (Signed)
H&P  Chief Complaint: Acute Cystitis  History of Present Illness: Andrea Norton is a 74 y.o. year old female  4.20.2021: She returns today continuing to c/o dysuria, foul-smelling and cloudy urine, fatigue, and urinary freq/urgency that she has had since last seen in office. She also notes having developed vaginal itching/irritation as well as visible redness/hives over the last 2-3 weeks. Her urine culture was positive at last visit but she was not given abx.  Past Medical History:  Diagnosis Date  . Abnormal stress test 01-22-2009   LOW RISK ADENOSINE NUCLEAR STUDY W/ PROBABLE MILD APICAL THINNING BUT NO ISCHEMIA  . Anxiety   . Arthritis    HANDS, KNEES  . B12 deficiency anemia   . Chronic back pain   . Chronic bronchitis ([facility])   . Chronic neck pain   . Coronary artery disease 2005   STRESS TEST /, NOte Dr Jenkins [place] EPIC/ no cardiologist now--STATES [place] 2010  . DDD (degenerative disc disease), cervical   . DDD (degenerative disc disease), lumbar   . Depression    states well controlled  . Diverticulitis of colon   . GERD (gastroesophageal reflux disease)   . H/O hiatal hernia   . Hemorrhoid   . Hyperlipidemia   . Hypertension      PCP Dr Edrick Oh; pt currently on no medication for treatment   . Hypothyroidism   . Inguinal hernia, right    with ventral hernia  . Pneumonia    hx of walking pneumonia  . Sleep apnea    couldnt tolerate machine- states last sleep study " many years ago"  . Status post primary angioplasty with coronary stent    DRUG-ELUTING STENT X1  TO PROXIMAL LAD  . Synovitis of knee HYPERTROPHIC RIGHT KNEE    Past Surgical History:  Procedure Laterality Date  . APPENDECTOMY  1993  . CATARACT EXTRACTION W/PHACO Right 05/06/2014   Procedure: CATARACT EXTRACTION PHACO AND INTRAOCULAR LENS PLACEMENT (IOC);  Surgeon: Elta Guadeloupe T. Gershon Crane, MD;  Location: [facility];  Service: Ophthalmology;  Laterality: Right;  CDE 6.41  . CATARACT EXTRACTION  W/PHACO Left 05/20/2014   Procedure: CATARACT EXTRACTION PHACO AND INTRAOCULAR LENS PLACEMENT (IOC);  Surgeon: Elta Guadeloupe T. Gershon Crane, MD;  Location: [facility];  Service: Ophthalmology;  Laterality: Left;  CDE:4.42  . COLON SURGERY    . CORONARY ANGIOPLASTY  2004   DRUG-ELUTING STENT X1 TO PROXIMAL  LAD  . CYSTOSCOPY WITH STENT PLACEMENT Right 04/14/2018   Procedure: CYSTOSCOPY WITH STENT PLACEMENT;  Surgeon: Franchot Gallo, MD;  Location: [facility];  Service: Urology;  Laterality: Right;  . EXCISIONAL TOTAL KNEE ARTHROPLASTY WITH ANTIBIOTIC SPACERS Right 09/14/2015   Procedure: RIGHT TOTAL KNEE RESSECTION ARTHROPLASTY WITH ANTIBIOTIC SPACERS;  Surgeon: Gaynelle Arabian, MD;  Location: [facility];  Service: Orthopedics;  Laterality: Right;  . EYE SURGERY    . HEMICOLECTOMY FOR DIVERTICULITIS  2005  . HYSTEROSCOPY WITH D & C  2002  . I & D KNEE WITH POLY EXCHANGE  02/01/2012   Procedure: IRRIGATION AND DEBRIDEMENT KNEE WITH POLY EXCHANGE;  Surgeon: Gearlean Alf, MD;  Location: [facility];  Service: Orthopedics;  Laterality: Right;  Right Knee Polyethlene Revision   . IR URETERAL STENT RIGHT NEW ACCESS W/O SEP NEPHROSTOMY CATH  06/01/2018  . IRRIGATION AND DEBRIDEMENT KNEE Right 03/10/2015   Procedure: IRRIGATION AND DEBRIDEMENT of right KNEE;  Surgeon: Gaynelle Arabian, MD;  [facility]: [facility];  Service: Orthopedics;  Laterality: Right;  . JOINT REPLACEMENT  right knee   . KNEE ARTHROSCOPY  LEFT 1999   RIGHT 2004  . KNEE ARTHROSCOPY  08/03/2011   Procedure: ARTHROSCOPY KNEE;  Surgeon: Loanne Drilling;  Location: [facility];  Service: Orthopedics;  Laterality: Right;  . KNEE ARTHROSCOPY Right 12/17/2014   Procedure: RIGHT ARTHROSCOPY KNEE, SYNOVECTOMY;  Surgeon: Ollen Gross, MD;  [facility]: [facility];  Service: Orthopedics;  Laterality: Right;  . KNEE ARTHROSCOPY W/ DEBRIDEMENT  01-22-2010   SEPTIC KNEE  . KNEE CLOSED REDUCTION  05-07-2009  RIGHT KNEE   LEFT KNEE  10-01-2009  . LAPAROSCOPIC GASTRIC  SLEEVE RESECTION N/A 07/21/2014   Procedure: LAPAROSCOPIC GASTRIC SLEEVE RESECTION, TAKEDOWN OF INCARCERATED VENTRAL HERNIA, ENTERAL LYSIS, UPPER ENDOSCOPY;  Surgeon: Valarie Merino, MD;  Location: [facility];  Service: General;  Laterality: N/A;  . LEFT FOOT SURG.  1990  . NEPHROLITHOTOMY Right 06/04/2018   Procedure: NEPHROLITHOTOMY PERCUTANEOUS;  Surgeon: Marcine Matar, MD;  Location: [facility];  Service: Urology;  Laterality: Right;  2.5 HRS  . OPEN PATELLOFEMORAL RIGHT KNEE/ LATERAL RELEASE  05-04-2009  . QUADRICEPS TENDON REPAIR Right 06/02/2015   Procedure: RIGHT KNEE QUADRICEP TENDON REPAIR ;  Surgeon: Ollen Gross, MD;  [facility]: [facility];  Service: Orthopedics;  Laterality: Right;  . RIGHT KNEE I & D POLYTHYLENE REVISION  02-04-2010   KNEE INFECTED  . SYNOVECTOMY  08/03/2011   Procedure: SYNOVECTOMY;  Surgeon: Loanne Drilling;  Location: [facility];  Service: Orthopedics;  Laterality: Right;  . TOTAL KNEE ARTHROPLASTY  LEFT  2005   RIGHT  03-13-2009  . TOTAL KNEE ARTHROPLASTY Right 11/23/2015   Procedure: RIGHT TOTAL KNEE ARTHROPLASTY REIMPLANTATION;  Surgeon: Ollen Gross, MD;  [facility];  Service: Orthopedics;  Laterality: Right;  . TOTAL KNEE REVISION  RIGHT 09-30-2009   LEFT 11-25-2009  . TOTAL KNEE REVISION Right 02/20/2013   Procedure: RIGHT TOTAL KNEE ARTHROPLASTY REVISION VERSUS RESECTION ARTHROPLASTY;  Surgeon: Loanne Drilling, MD;  [facility];  Service: Orthopedics;  Laterality: Right;  . TOTAL KNEE REVISION Right 04/02/2014   Procedure: RIGHT KNEE FEMORAL ARTHROPLASTY REVISION;  Surgeon: Loanne Drilling, MD;  [facility];  Service: Orthopedics;  Laterality: Right;    Home Medications:  (Not in a hospital admission)   Allergies:  Allergies  Allergen Reactions  . Oxybutynin Anaphylaxis    Pt states it made her mouth swell   . Morphine And Related Other (See Comments)    Hallunications, confusion for 1 month    Family History  Problem  Relation Age of Onset  . COPD Mother   . Cancer Father        lung    Social History:  reports that she quit smoking about 17 years ago. Her smoking use included cigarettes. She has a 30.00 pack-year smoking history. She has never used smokeless tobacco. She reports that she does not drink alcohol or use drugs.  Urological Symptom Review Patient is experiencing the following symptoms: Frequent urination Hard to postpone urination Burning/pain with urination Get up at night to urinate Urinary tract infection Review of Systems Gastrointestinal (upper)  : Negative for upper GI symptoms Gastrointestinal (lower) : Negative for lower GI symptoms Constitutional : Fatigue Skin: Itching Eyes: Blurred vision Ear/Nose/Throat : Sinus problems Hematologic/Lymphatic: Easy bruising Cardiovascular : Negative for cardiovascular symptoms Respiratory : Shortness of breath Endocrine: Negative for endocrine symptoms Musculoskeletal: Back pain Joint pain Neurological: Negative for neurological symptoms Psychologic: Depression  Physical Exam:  Vital signs in last 24  hours: Temp:  [97.5 F (36.4 C)] 97.5 F (36.4 C) (04/20 1542) Pulse Rate:  [68] 68 (04/20 1542) BP: (117)/(78) 117/78 (04/20 1542) General:  Alert and oriented, No acute distress HEENT: Normocephalic, atraumatic Neck: No JVD or lymphadenopathy Cardiovascular: Regular rate  Lungs: Normal inspiratory/expiratory excursion Extremities: No edema Neurologic: Grossly intact  Laboratory Data:   I have reviewed prior pt notes  I have reviewed notes from referring/previous physicians  I have reviewed urinalysis results  I have reviewed prior urine culture   Impression/Assessment:  She has been symptomatic for infection since last visit. UA today seems to indicate persistent infection and I will start her on macrobid based on results of her last culture.   Plan:  1. Start on macrobid -- she will call if she is  still having sx's after finishing full course.   2. Repeat urine culture today -- will forward these results and change rx if we need to.   3. Keep scheduled follow-up.  Luella Cook 11/12/2019, 4:02 PM  Bertram Millard. Dahlstedt MD

## 2019-11-12 NOTE — Progress Notes (Signed)
Urological Symptom Review  Patient is experiencing the following symptoms: Frequent urination Hard to postpone urination Burning/pain with urination Get up at night to urinate Urinary tract infection   Review of Systems  Gastrointestinal (upper)  : Negative for upper GI symptoms  Gastrointestinal (lower) : Negative for lower GI symptoms  Constitutional : Fatigue  Skin: Itching  Eyes: Blurred vision  Ear/Nose/Throat : Sinus problems  Hematologic/Lymphatic: Easy bruising  Cardiovascular : Negative for cardiovascular symptoms  Respiratory : Shortness of breath  Endocrine: Negative for endocrine symptoms  Musculoskeletal: Back pain Joint pain  Neurological: Negative for neurological symptoms  Psychologic: Depression

## 2019-11-14 LAB — URINE CULTURE: Culture: <10000 — AB

## 2019-11-19 ENCOUNTER — Telehealth: Payer: Self-pay

## 2019-11-19 NOTE — Telephone Encounter (Signed)
Pt notified. Voiced understanding.

## 2019-11-19 NOTE — Telephone Encounter (Signed)
-----   Message from Marcine Matar, MD sent at 11/18/2019  7:27 AM EDT ----- Notify pt--culture was neg but OK to finish abx ----- Message ----- From: Ferdinand Lango, RN Sent: 11/15/2019  11:54 AM EDT To: Marcine Matar, MD  Urine culture

## 2019-12-17 ENCOUNTER — Ambulatory Visit: Payer: Medicare Other | Admitting: Urology

## 2020-03-31 ENCOUNTER — Ambulatory Visit (INDEPENDENT_AMBULATORY_CARE_PROVIDER_SITE_OTHER): Payer: Medicare Other | Admitting: Urology

## 2020-03-31 ENCOUNTER — Other Ambulatory Visit: Payer: Self-pay

## 2020-03-31 ENCOUNTER — Ambulatory Visit (HOSPITAL_COMMUNITY)
Admission: RE | Admit: 2020-03-31 | Discharge: 2020-03-31 | Disposition: A | Discharge status: Home / Self Care | Payer: Medicare Other | Source: Ambulatory Visit | Attending: Urology | Admitting: Urology

## 2020-03-31 ENCOUNTER — Encounter: Payer: Self-pay | Admitting: Urology

## 2020-03-31 VITALS — BP 157/87 | HR 74 | Temp 98.2°F | Ht 64.0 in | Wt 207.0 lb

## 2020-03-31 DIAGNOSIS — N201 Calculus of ureter: Secondary | ICD-10-CM

## 2020-03-31 DIAGNOSIS — N3000 Acute cystitis without hematuria: Secondary | ICD-10-CM | POA: Diagnosis not present

## 2020-03-31 LAB — URINALYSIS, ROUTINE W REFLEX MICROSCOPIC
Bilirubin, UA: NEGATIVE
Glucose, UA: NEGATIVE
Ketones, UA: NEGATIVE
Leukocytes,UA: NEGATIVE
Nitrite, UA: NEGATIVE
Protein,UA: NEGATIVE
Specific Gravity, UA: 1.02 (ref 1.005–1.030)
Urobilinogen, Ur: 1 mg/dL (ref 0.2–1.0)
pH, UA: 6 (ref 5.0–7.5)

## 2020-03-31 LAB — MICROSCOPIC EXAMINATION
Epithelial Cells (non renal): >10 /hpf — AB (ref 0–10)
Renal Epithel, UA: NONE SEEN /hpf

## 2020-03-31 NOTE — Progress Notes (Signed)
Urological Symptom Review  Patient is experiencing the following symptoms: Frequent urination Hard to postpone urination Burning/pain with urination Get up at night to urinate Leakage of urine Kidney stone UTI Review of Systems  Gastrointestinal (upper)  : Negative for upper GI symptoms  Gastrointestinal (lower) : Diarrhea  Constitutional : Negative for symptoms  Skin: Itching  Eyes: Blurred vision  Ear/Nose/Throat : Negative for Ear/Nose/Throat symptoms  Hematologic/Lymphatic: Negative for Hematologic/Lymphatic symptoms  Cardiovascular : Leg swelling  Respiratory : Negative for respiratory symptoms  Endocrine: Negative for endocrine symptoms  Musculoskeletal: Back pain Joint pain  Neurological: Negative for neurological symptoms  Psychologic: N/A

## 2020-03-31 NOTE — Progress Notes (Signed)
H&P  Chief Complaint: F/U with Kidney Stones  History of Present Illness: Andrea Norton is a 74 y.o. year old female  9.7.2021: Pt denies any interim UTIs. Pt has not had a recent xray or ultrasound to monitor her kidney stones. Pt reports that she has intermittent pain with movement but no associated nausea. Pt submits no other questions or concerns at this time.  (below copied from [facility] records):  Andrea Norton is a 74 y.o. year old female  Ureteral Stones:  Andrea Norton is a 74 year-old female established patient who is here for ureteral calculi after a surgical intervention.  This procedure was done 04/13/2018.   10.8.19: She underwent urgent stenting for infected, obstructing right renal pelvic stone on 9.20.2019. She had postoperative hospitalization for IV antibiotic administration. She has completed her oral antibiotics.  Stone was 20.1 mm by 13 mm.   11.11.2019: PCNL. Stent was left in, she did not have a nephrostomy tube.   11.26.2019: She is here for follow-up. She has some discomfort with urination from the stent, no fever, no chills. Occasional gross hematuria. No flank pain.   16-Aug-2018: Her husband died last week. She was recently treated for an infection as well. She is having no flank pain or hematuria. Recent renal ultrasound revealed no hydronephrosis or stone disease.   3.10.2020: Here to discuss 24 hr urine results. She did have slightly low urine volume, borderline high urine calcium, very low urine citrate. She has had no recent flank pain, hematuria, voiding issues.   3.2.2021: She returns today for follow-up. In the interval she has continued to have recurrent urinary tract infections w/ dysuria, bladder pressure, and increased urgency/freq. She feels as though she is currently infected -- she was started on nitrofurantoin but she feels like this was not effective. She has had her rx changed yet. Most recent scan revealed bilateral renal calculi  (these were not present on her previous scan). Recent 24 hr urine revealed low citrate -- she is not currently on medication for this.  4.20.2021: She returns today continuing to c/o dysuria, foul-smelling and cloudy urine, fatigue, and urinary freq/urgency that she has had since last seen in office. She also notes having developed vaginal itching/irritation as well as visible redness/hives over the last 2-3 weeks. Her urine culture was positive at last visit but she was not given abx.  Past Medical History:  Diagnosis Date  . Abnormal stress test 01-22-2009   LOW RISK ADENOSINE NUCLEAR STUDY W/ PROBABLE MILD APICAL THINNING BUT NO ISCHEMIA  . Anxiety   . Arthritis    HANDS, KNEES  . B12 deficiency anemia   . Chronic back pain   . Chronic bronchitis ([facility])   . Chronic neck pain   . Coronary artery disease 2005   STRESS TEST /, NOte Dr Kirtland Bouchard EPIC/ no cardiologist now--STATES HEART STENT PLACED 2010  . DDD (degenerative disc disease), cervical   . DDD (degenerative disc disease), lumbar   . Depression    states well controlled  . Diverticulitis of colon   . GERD (gastroesophageal reflux disease)   . H/O hiatal hernia   . Hemorrhoid   . Hyperlipidemia   . Hypertension      PCP Dr Lysbeth Galas; pt currently on no medication for treatment   . Hypothyroidism   . Inguinal hernia, right    with ventral hernia  . Pneumonia    hx of walking pneumonia  . Sleep apnea    couldnt tolerate machine- states  last sleep study " many years ago"  . Status post primary angioplasty with coronary stent    DRUG-ELUTING STENT X1  TO PROXIMAL LAD  . Synovitis of knee HYPERTROPHIC RIGHT KNEE    Past Surgical History:  Procedure Laterality Date  . APPENDECTOMY  1993  . CATARACT EXTRACTION W/PHACO Right 05/06/2014   Procedure: CATARACT EXTRACTION PHACO AND INTRAOCULAR LENS PLACEMENT (IOC);  Surgeon: Loraine Leriche T. Nile Riggs, MD;  Location: [facility];  Service: Ophthalmology;  Laterality: Right;  CDE 6.41  . CATARACT  EXTRACTION W/PHACO Left 05/20/2014   Procedure: CATARACT EXTRACTION PHACO AND INTRAOCULAR LENS PLACEMENT (IOC);  Surgeon: Loraine Leriche T. Nile Riggs, MD;  Location: [facility];  Service: Ophthalmology;  Laterality: Left;  CDE:4.42  . COLON SURGERY    . CORONARY ANGIOPLASTY  2004   DRUG-ELUTING STENT X1 TO PROXIMAL  LAD  . CYSTOSCOPY WITH STENT PLACEMENT Right 04/14/2018   Procedure: CYSTOSCOPY WITH STENT PLACEMENT;  Surgeon: Marcine Matar, MD;  Location: [facility];  Service: Urology;  Laterality: Right;  . EXCISIONAL TOTAL KNEE ARTHROPLASTY WITH ANTIBIOTIC SPACERS Right 09/14/2015   Procedure: RIGHT TOTAL KNEE RESSECTION ARTHROPLASTY WITH ANTIBIOTIC SPACERS;  Surgeon: Ollen Gross, MD;  [facility];  Service: Orthopedics;  Laterality: Right;  . EYE SURGERY    . HEMICOLECTOMY FOR DIVERTICULITIS  2005  . HYSTEROSCOPY WITH D & C  2002  . I & D KNEE WITH POLY EXCHANGE  02/01/2012   Procedure: IRRIGATION AND DEBRIDEMENT KNEE WITH POLY EXCHANGE;  Surgeon: Loanne Drilling, MD;  [facility]: [facility];  Service: Orthopedics;  Laterality: Right;  Right Knee Polyethlene Revision   . IR URETERAL STENT RIGHT NEW ACCESS W/O SEP NEPHROSTOMY CATH  06/01/2018  . IRRIGATION AND DEBRIDEMENT KNEE Right 03/10/2015   Procedure: IRRIGATION AND DEBRIDEMENT of right KNEE;  Surgeon: Ollen Gross, MD;  [facility];  Service: Orthopedics;  Laterality: Right;  . JOINT REPLACEMENT     right knee   . KNEE ARTHROSCOPY  LEFT 1999   RIGHT 2004  . KNEE ARTHROSCOPY  08/03/2011   Procedure: ARTHROSCOPY KNEE;  Surgeon: Loanne Drilling;  Location: [facility];  Service: Orthopedics;  Laterality: Right;  . KNEE ARTHROSCOPY Right 12/17/2014   Procedure: RIGHT ARTHROSCOPY KNEE, SYNOVECTOMY;  Surgeon: Ollen Gross, MD;  [facility];  Service: Orthopedics;  Laterality: Right;  . KNEE ARTHROSCOPY W/ DEBRIDEMENT  01-22-2010   SEPTIC KNEE  . KNEE CLOSED REDUCTION  05-07-2009  RIGHT KNEE   LEFT KNEE  10-01-2009  . LAPAROSCOPIC  GASTRIC SLEEVE RESECTION N/A 07/21/2014   Procedure: LAPAROSCOPIC GASTRIC SLEEVE RESECTION, TAKEDOWN OF INCARCERATED VENTRAL HERNIA, ENTERAL LYSIS, UPPER ENDOSCOPY;  Surgeon: Valarie Merino, MD;  Location: [facility];  Service: General;  Laterality: N/A;  . LEFT FOOT SURG.  1990  . NEPHROLITHOTOMY Right 06/04/2018   Procedure: NEPHROLITHOTOMY PERCUTANEOUS;  Surgeon: Marcine Matar, MD;  Location: [facility];  Service: Urology;  Laterality: Right;  2.5 HRS  . OPEN PATELLOFEMORAL RIGHT KNEE/ LATERAL RELEASE  05-04-2009  . QUADRICEPS TENDON REPAIR Right 06/02/2015   Procedure: RIGHT KNEE QUADRICEP TENDON REPAIR ;  Surgeon: Ollen Gross, MD;  [facility]: [facility];  Service: Orthopedics;  Laterality: Right;  . RIGHT KNEE I & D POLYTHYLENE REVISION  02-04-2010   KNEE INFECTED  . SYNOVECTOMY  08/03/2011   Procedure: SYNOVECTOMY;  Surgeon: Loanne Drilling;  Location: [facility];  Service: Orthopedics;  Laterality: Right;  . TOTAL KNEE ARTHROPLASTY  LEFT  2005   RIGHT  03-13-2009  .  TOTAL KNEE ARTHROPLASTY Right 11/23/2015   Procedure: RIGHT TOTAL KNEE ARTHROPLASTY REIMPLANTATION;  Surgeon: Ollen Gross, MD;  [facility];  Service: Orthopedics;  Laterality: Right;  . TOTAL KNEE REVISION  RIGHT 09-30-2009   LEFT 11-25-2009  . TOTAL KNEE REVISION Right 02/20/2013   Procedure: RIGHT TOTAL KNEE ARTHROPLASTY REVISION VERSUS RESECTION ARTHROPLASTY;  Surgeon: Loanne Drilling, MD;  [facility];  Service: Orthopedics;  Laterality: Right;  . TOTAL KNEE REVISION Right 04/02/2014   Procedure: RIGHT KNEE FEMORAL ARTHROPLASTY REVISION;  Surgeon: Loanne Drilling, MD;  [facility];  Service: Orthopedics;  Laterality: Right;    Home Medications:  (Not in a hospital admission)   Allergies:  Allergies  Allergen Reactions  . Oxybutynin Anaphylaxis    Pt states it made her mouth swell   . Morphine And Related Other (See Comments)    Hallunications, confusion for 1 month    Family History   Problem Relation Age of Onset  . COPD Mother   . Cancer Father        lung    Social History:  reports that she quit smoking about 17 years ago. Her smoking use included cigarettes. She has a 30.00 pack-year smoking history. She has never used smokeless tobacco. She reports that she does not drink alcohol and does not use drugs.  ROS: A complete review of systems was performed.  All systems are negative except for pertinent findings as noted.  Physical Exam:  Vital signs in last 24 hours: BP: ()/()  Arterial Line BP: ()/()  General:  Alert and oriented, No acute distress HEENT: Normocephalic, atraumatic Neck: No JVD  Lungs: Normal inspiratory/expiratory excursion Extremities: No edema Neurologic: Grossly intact  I have reviewed prior pt notes  I have reviewed notes from referring/previous physicians  I have reviewed urinalysis results  I have independently reviewed prior imaging   Impression/Assessment:  Calculous disease--no current sx's  Plan:  1. Pt advised regarding dietary modification to reduce the formation of stones - increase water/citrate intake, decrease sodium intake.  2. F/U at [facility] for KUB at next availability and will contact pt to discuss results.  3. F/U in 1 year for OV and KUB.  Kenney Houseman 03/31/2020, 12:26 PM  Bertram Millard. Shaquella Stamant MD

## 2020-04-02 ENCOUNTER — Telehealth: Payer: Self-pay

## 2020-04-02 NOTE — Telephone Encounter (Signed)
Left message to return call 

## 2020-04-02 NOTE — Progress Notes (Signed)
Letter sent.

## 2020-04-02 NOTE — Telephone Encounter (Signed)
-----   Message from Marcine Matar, MD sent at 04/02/2020  3:17 PM EDT ----- Notify pt--xr shows no kidney stones ----- Message ----- From: Ferdinand Lango, RN Sent: 04/01/2020  10:55 AM EDT To: Marcine Matar, MD  Please review

## 2020-04-03 NOTE — Telephone Encounter (Signed)
Pt called and made aware

## 2020-04-03 NOTE — Telephone Encounter (Signed)
-----   Message from Stephen Dahlstedt, MD sent at 04/02/2020  3:17 PM EDT ----- °Notify pt--xr shows no kidney stones °----- Message ----- °From: Lee, Amanda K, RN °Sent: 04/01/2020  10:55 AM EDT °To: Stephen Dahlstedt, MD ° °Please review ° °

## 2020-09-23 ENCOUNTER — Other Ambulatory Visit: Payer: Self-pay | Admitting: Urology

## 2020-09-23 DIAGNOSIS — N200 Calculus of kidney: Secondary | ICD-10-CM

## 2021-03-30 ENCOUNTER — Ambulatory Visit: Payer: Medicare Other | Admitting: Urology

## 2021-03-30 DIAGNOSIS — N200 Calculus of kidney: Secondary | ICD-10-CM

## 2021-06-21 ENCOUNTER — Encounter: Payer: Self-pay | Admitting: Cardiology

## 2021-06-21 NOTE — Progress Notes (Signed)
Cardiology Office Note   Date:  06/23/2021   ID:  Andrea, Norton 1945-08-13, MRN [id number]  PCP:  Eartha Inch, MD  Cardiologist:   None   Chief Complaint  Patient presents with   Coronary Artery Disease       History of Present Illness: Andrea Norton is a 75 y.o. female who was referred by Eartha Inch, MD for evaluation of CAD   She does have a history of DES to the LAD in 2004.    Echo in 2019 demonstrated NL LVEF.  There were no significant valvular abnormalities.  This was at the time of sepsis with a kidney stone and she had an elevated troponin.    Since I last saw her she has been more bothered by her back and her knee and she gets around for the most part wheelchair or very slowly along with a walker.  She lives at home with her grandson.  Her son and her husband died.  The patient denies any new symptoms such as chest discomfort, neck or arm discomfort. There has been no new shortness of breath, PND or orthopnea. There have been no reported palpitations, presyncope or syncope.   Past Medical History:  Diagnosis Date   Anxiety    Arthritis    HANDS, KNEES   B12 deficiency anemia    Chronic bronchitis (HCC)    Coronary artery disease 2005   DDD (degenerative disc disease), cervical    DDD (degenerative disc disease), lumbar    Depression    states well controlled   Diverticulitis of colon    GERD (gastroesophageal reflux disease)    H/O hiatal hernia    Hemorrhoid    Hyperlipidemia    Hypertension      PCP Dr Lysbeth Galas; pt currently on no medication for treatment    Hypothyroidism    Pneumonia    hx of walking pneumonia   Sleep apnea    couldnt tolerate machine- states last sleep study " many years ago"   Status post primary angioplasty with coronary stent    DRUG-ELUTING STENT X1  TO PROXIMAL LAD   Synovitis of knee HYPERTROPHIC RIGHT KNEE    Past Surgical History:  Procedure Laterality Date   APPENDECTOMY  1993   CATARACT  EXTRACTION W/PHACO Right 05/06/2014   Procedure: CATARACT EXTRACTION PHACO AND INTRAOCULAR LENS PLACEMENT (IOC);  Surgeon: Loraine Leriche T. Nile Riggs, MD;  Location: [facility];  Service: Ophthalmology;  Laterality: Right;  CDE 6.41   CATARACT EXTRACTION W/PHACO Left 05/20/2014   Procedure: CATARACT EXTRACTION PHACO AND INTRAOCULAR LENS PLACEMENT (IOC);  Surgeon: Loraine Leriche T. Nile Riggs, MD;  Location: [facility];  Service: Ophthalmology;  Laterality: Left;  CDE:4.42   COLON SURGERY     CORONARY ANGIOPLASTY  2004   DRUG-ELUTING STENT X1 TO PROXIMAL  LAD   CYSTOSCOPY WITH STENT PLACEMENT Right 04/14/2018   Procedure: CYSTOSCOPY WITH STENT PLACEMENT;  Surgeon: Marcine Matar, MD;  Location: [facility];  Service: Urology;  Laterality: Right;   EXCISIONAL TOTAL KNEE ARTHROPLASTY WITH ANTIBIOTIC SPACERS Right 09/14/2015   Procedure: RIGHT TOTAL KNEE RESSECTION ARTHROPLASTY WITH ANTIBIOTIC SPACERS;  Surgeon: Ollen Gross, MD;  Location: [facility];  Service: Orthopedics;  Laterality: Right;   EYE SURGERY     HEMICOLECTOMY FOR DIVERTICULITIS  2005   HYSTEROSCOPY WITH D & C  2002   I & D KNEE WITH POLY EXCHANGE  02/01/2012   Procedure: IRRIGATION AND DEBRIDEMENT KNEE WITH POLY EXCHANGE;  Surgeon: Homero Fellers  Dulcy Fanny, MD;  Location: [facility];  Service: Orthopedics;  Laterality: Right;  Right Knee Polyethlene Revision    IR URETERAL STENT RIGHT NEW ACCESS W/O SEP NEPHROSTOMY CATH  06/01/2018   IRRIGATION AND DEBRIDEMENT KNEE Right 03/10/2015   Procedure: IRRIGATION AND DEBRIDEMENT of right KNEE;  Surgeon: Ollen Gross, MD;  Location: [facility];  Service: Orthopedics;  Laterality: Right;   JOINT REPLACEMENT     right knee    KNEE ARTHROSCOPY  LEFT 1999   RIGHT 2004   KNEE ARTHROSCOPY  08/03/2011   Procedure: ARTHROSCOPY KNEE;  Surgeon: Loanne Drilling;  Location: [facility];  Service: Orthopedics;  Laterality: Right;   KNEE ARTHROSCOPY Right 12/17/2014   Procedure: RIGHT ARTHROSCOPY KNEE, SYNOVECTOMY;  Surgeon: Ollen Gross, MD;   [facility];  Service: Orthopedics;  Laterality: Right;   KNEE ARTHROSCOPY W/ DEBRIDEMENT  01-22-2010   SEPTIC KNEE   KNEE CLOSED REDUCTION  05-07-2009  RIGHT KNEE   LEFT KNEE  10-01-2009   LAPAROSCOPIC GASTRIC SLEEVE RESECTION N/A 07/21/2014   Procedure: LAPAROSCOPIC GASTRIC SLEEVE RESECTION, TAKEDOWN OF INCARCERATED VENTRAL HERNIA, ENTERAL LYSIS, UPPER ENDOSCOPY;  Surgeon: Valarie Merino, MD;  Location: [facility];  Service: General;  Laterality: N/A;   LEFT FOOT SURG.  1990   NEPHROLITHOTOMY Right 06/04/2018   Procedure: NEPHROLITHOTOMY PERCUTANEOUS;  Surgeon: Marcine Matar, MD;  Location: [facility];  Service: Urology;  Laterality: Right;  2.5 HRS   OPEN PATELLOFEMORAL RIGHT KNEE/ LATERAL RELEASE  05-04-2009   QUADRICEPS TENDON REPAIR Right 06/02/2015   Procedure: RIGHT KNEE QUADRICEP TENDON REPAIR ;  Surgeon: Ollen Gross, MD;  [facility];  Service: Orthopedics;  Laterality: Right;   RIGHT KNEE I & D POLYTHYLENE REVISION  02-04-2010   KNEE INFECTED   SYNOVECTOMY  08/03/2011   Procedure: SYNOVECTOMY;  Surgeon: Gus Rankin Aluisio;  Location: [facility];  Service: Orthopedics;  Laterality: Right;   TOTAL KNEE ARTHROPLASTY  LEFT  2005   RIGHT  03-13-2009   TOTAL KNEE ARTHROPLASTY Right 11/23/2015   Procedure: RIGHT TOTAL KNEE ARTHROPLASTY REIMPLANTATION;  Surgeon: Ollen Gross, MD;  [facility];  Service: Orthopedics;  Laterality: Right;   TOTAL KNEE REVISION  RIGHT 09-30-2009   LEFT 11-25-2009   TOTAL KNEE REVISION Right 02/20/2013   Procedure: RIGHT TOTAL KNEE ARTHROPLASTY REVISION VERSUS RESECTION ARTHROPLASTY;  Surgeon: Loanne Drilling, MD;  [facility];  Service: Orthopedics;  Laterality: Right;   TOTAL KNEE REVISION Right 04/02/2014   Procedure: RIGHT KNEE FEMORAL ARTHROPLASTY REVISION;  Surgeon: Loanne Drilling, MD;  Location: [facility];  Service: Orthopedics;  Laterality: Right;     Current Outpatient Medications  Medication Sig Dispense Refill    acetaminophen (TYLENOL) 500 MG tablet Take 1,000 mg by mouth every 6 (six) hours as needed for moderate pain or headache.      aspirin EC 81 MG tablet Take 81 mg by mouth at bedtime.      cyanocobalamin (,VITAMIN B-12,) 1000 MCG/ML injection Inject 1,000 mcg into the muscle every 30 (thirty) days.      escitalopram (LEXAPRO) 10 MG tablet Take 15 mg by mouth daily with lunch.      gabapentin (NEURONTIN) 100 MG capsule Take 300 mg by mouth 2 (two) times daily.      levothyroxine (SYNTHROID) 75 MCG tablet Take 75 mcg by mouth daily.     meloxicam (MOBIC) 7.5 MG tablet Take 7.5 mg by mouth 2 (two) times daily.  pantoprazole (PROTONIX) 40 MG tablet Take 40 mg by mouth daily with lunch.      pravastatin (PRAVACHOL) 40 MG tablet Take 1 tablet (40 mg total) by mouth every evening. 90 tablet 3   sulfamethoxazole-trimethoprim (BACTRIM DS) 800-160 MG tablet Take 1 tablet by mouth every 12 (twelve) hours. (Patient not taking: Reported on 06/23/2021) 10 tablet 0   Vitamin D, Ergocalciferol, (DRISDOL) 50000 units CAPS capsule Take 50,000 Units by mouth every Friday.  (Patient not taking: Reported on 06/23/2021)     No current facility-administered medications for this visit.   Facility-Administered Medications Ordered in Other Visits  Medication Dose Route Frequency Provider Last Rate Last Admin   tranexamic acid (CYKLOKAPRON) 2,000 mg in sodium chloride 0.9 % 50 mL Topical Application  2,000 mg Topical Once Porterfield, Amber, PA-C        Allergies:   Oxybutynin and Morphine and related    ROS:  Please see the history of present illness.   Otherwise, review of systems are positive for none.   All other systems are reviewed and negative.    PHYSICAL EXAM: VS:  BP 130/80   Pulse 76   Ht 5\' 4"  (1.626 m)   Wt 220 lb (99.8 kg)   BMI 37.76 kg/m  , BMI Body mass index is 37.76 kg/m. GEN:  No distress NECK:  No jugular venous distention at 90 degrees, waveform within normal limits, carotid upstroke  brisk and symmetric, left  bruits, no thyromegaly LYMPHATICS:  No cervical adenopathy LUNGS:  Clear to auscultation bilaterally BACK:  No CVA tenderness CHEST:  Unremarkable HEART:  S1 and S2 within normal limits, no S3, no S4, no clicks, no rubs, no murmurs ABD:  Positive bowel sounds normal in frequency in pitch, no bruits, no rebound, no guarding, unable to assess midline mass or bruit with the patient seated. EXT:  2 plus pulses throughout, moderate edema, no cyanosis no clubbing SKIN:  No rashes no nodules NEURO:  Cranial nerves II through XII grossly intact, motor grossly intact throughout PSYCH:  Cognitively intact, oriented to person place and time   EKG:  EKG is ordered today. The ekg ordered today demonstrates sinus rhythm, rate 76, axis within normal limits, intervals within normal limits, no acute ST-T wave changes.   Recent Labs: No results found for requested labs within last 8760 hours.    Lipid Panel No results found for: CHOL, TRIG, HDL, CHOLHDL, VLDL, LDLCALC, LDLDIRECT    Wt Readings from Last 3 Encounters:  06/23/21 220 lb (99.8 kg)  03/31/20 207 lb (93.9 kg)  09/24/19 225 lb (102.1 kg)      Other studies Reviewed: Additional studies/ records that were reviewed today include: None. Review of the above records demonstrates:  NA   ASSESSMENT AND PLAN:  CAD:  The patient has no new sypmtoms.  No further cardiovascular testing is indicated.  We will continue with aggressive risk reduction and meds as listed.   HTN : Her blood pressure is at target.  No change in therapy.   DYSLIPIDEMIA:    She previously was on simvastatin but does not know what happened to this.  She has not been at target with her lipids in the past.  I would like her to be at least on pravastatin and I will start this at 40 mg daily and check a lipid profile in 10 weeks.   BRUIT: I will order carotid Dopplers   Current medicines are reviewed at length with the patient today.  The  patient does not have concerns regarding medicines.  The following changes have been made:  no change  Labs/ tests ordered today include:   Orders Placed This Encounter  Procedures   US Carotid Bilateral   Hepatic function panel   Lipid panel   EKG 12-Lead      Disposition:   FU with me in 12 months.     Signed, Rollene Rotunda, MD  06/23/2021 1:14 PM    [facility]

## 2021-06-23 ENCOUNTER — Encounter: Payer: Self-pay | Admitting: Cardiology

## 2021-06-23 ENCOUNTER — Ambulatory Visit (INDEPENDENT_AMBULATORY_CARE_PROVIDER_SITE_OTHER): Payer: Medicare Other | Admitting: Cardiology

## 2021-06-23 ENCOUNTER — Other Ambulatory Visit: Payer: Self-pay | Admitting: *Deleted

## 2021-06-23 ENCOUNTER — Other Ambulatory Visit: Payer: Self-pay

## 2021-06-23 VITALS — BP 130/80 | HR 76 | Ht 64.0 in | Wt 220.0 lb

## 2021-06-23 DIAGNOSIS — I10 Essential (primary) hypertension: Secondary | ICD-10-CM | POA: Diagnosis not present

## 2021-06-23 DIAGNOSIS — E785 Hyperlipidemia, unspecified: Secondary | ICD-10-CM

## 2021-06-23 DIAGNOSIS — Z79899 Other long term (current) drug therapy: Secondary | ICD-10-CM

## 2021-06-23 DIAGNOSIS — I2510 Atherosclerotic heart disease of native coronary artery without angina pectoris: Secondary | ICD-10-CM

## 2021-06-23 DIAGNOSIS — R0989 Other specified symptoms and signs involving the circulatory and respiratory systems: Secondary | ICD-10-CM

## 2021-06-23 MED ORDER — PRAVASTATIN SODIUM 40 MG PO TABS: 40.0000 mg | ORAL_TABLET | Freq: Every evening | ORAL | 3 refills | Status: DC

## 2021-06-23 NOTE — Patient Instructions (Signed)
Medication Instructions:  Please start Pravastatin 40 mg  a day. Continue all other medications as listed.  *If you need a refill on your cardiac medications before your next appointment, please call your pharmacy*  Lab Work: Please have fasting blood work in approximately 10 weeks after you start Pravastatin.  You may have this drawn at any [facility] location. If you have labs (blood work) drawn today and your tests are completely normal, you will receive your results only by: MyChart Message (if you have MyChart) OR A paper copy in the mail If you have any lab test that is abnormal or we need to change your treatment, we will call you to review the results.   Testing/Procedures: Your physician has requested that you have a carotid duplex. This test is an ultrasound of the carotid arteries in your neck. It looks at blood flow through these arteries that supply the brain with blood. Allow one hour for this exam. There are no restrictions or special instructions.  This wiill be completed at [facility].  You will be called to be scheduled.  Follow-Up: At [facility], you and your health needs are our priority.  As part of our continuing mission to provide you with exceptional heart care, we have created designated Provider Care Teams.  These Care Teams include your primary Cardiologist (physician) and Advanced Practice Providers (APPs -  Physician Assistants and Nurse Practitioners) who all work together to provide you with the care you need, when you need it.  We recommend signing up for the patient portal called "MyChart".  Sign up information is provided on this After Visit Summary.  MyChart is used to connect with patients for Virtual Visits (Telemedicine).  Patients are able to view lab/test results, encounter notes, upcoming appointments, etc.  Non-urgent messages can be sent to your provider as well.   To learn more about what you can do with MyChart, go to [email or web address].     Your next appointment:   1 year(s)  The format for your next appointment:   In Person  Provider:   Rollene Rotunda, MD    Thank you for choosing [facility]!!

## 2021-07-02 ENCOUNTER — Ambulatory Visit (HOSPITAL_COMMUNITY): Payer: Medicare Other

## 2021-07-12 ENCOUNTER — Ambulatory Visit (HOSPITAL_COMMUNITY)
Admission: RE | Admit: 2021-07-12 | Discharge: 2021-07-12 | Disposition: A | Discharge status: Home / Self Care | Payer: Medicare Other | Source: Ambulatory Visit | Attending: Cardiology | Admitting: Cardiology

## 2021-07-12 ENCOUNTER — Other Ambulatory Visit: Payer: Self-pay | Admitting: Cardiology

## 2021-07-12 ENCOUNTER — Other Ambulatory Visit: Payer: Self-pay

## 2021-07-12 DIAGNOSIS — R0989 Other specified symptoms and signs involving the circulatory and respiratory systems: Secondary | ICD-10-CM | POA: Diagnosis not present

## 2021-07-13 LAB — HEPATIC FUNCTION PANEL
ALT: 9 IU/L (ref 0–32)
AST: 15 IU/L (ref 0–40)
Albumin: 3.7 g/dL (ref 3.7–4.7)
Alkaline Phosphatase: 106 IU/L (ref 44–121)
Bilirubin Total: 1.3 mg/dL — ABNORMAL HIGH (ref 0.0–1.2)
Bilirubin, Direct: 0.37 mg/dL (ref 0.00–0.40)
Total Protein: 6.5 g/dL (ref 6.0–8.5)

## 2021-07-13 LAB — LIPID PANEL
Chol/HDL Ratio: 2.1 ratio (ref 0.0–4.4)
Cholesterol, Total: 114 mg/dL (ref 100–199)
HDL: 55 mg/dL (ref >39)
LDL Chol Calc (NIH): 42 mg/dL (ref 0–99)
Triglycerides: 89 mg/dL (ref 0–149)
VLDL Cholesterol Cal: 17 mg/dL (ref 5–40)

## 2021-07-14 ENCOUNTER — Encounter: Payer: Self-pay | Admitting: *Deleted

## 2021-09-30 ENCOUNTER — Other Ambulatory Visit (HOSPITAL_COMMUNITY): Payer: Self-pay | Admitting: Family Medicine

## 2021-09-30 DIAGNOSIS — Z780 Asymptomatic menopausal state: Secondary | ICD-10-CM

## 2021-10-01 ENCOUNTER — Ambulatory Visit: Payer: Medicare Other | Admitting: Urology

## 2021-10-01 NOTE — Progress Notes (Deleted)
? ?Assessment: ?1. Urinary tract infection without hematuria, site unspecified   ? ? ?Plan: ?*** ? ?Chief Complaint: ?No chief complaint on file. ? ? ?HPI: ?Andrea Norton is a 76 y.o. female who presents for evaluation of UTI symptoms.  She has a history of UTIs and nephrolithiasis and has been followed by Dr. Retta Diones.  Her last visit was in September 2021.  She was not having any UTI symptoms at that time.  No stone symptoms. 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 ? ? ?Portions of the above documentation were copied from a prior visit for review purposes only. ? ?Allergies: ?Allergies  ?Allergen Reactions  ? Oxybutynin Anaphylaxis  ?  Pt states it made her mouth swell   ? Morphine And Related Other (See Comments)  ?  Hallunications, confusion for 1 month  ? ? ?PMH: ?Past Medical History:  ?Diagnosis Date  ? Anxiety   ? Arthritis   ? HANDS, KNEES  ? B12 deficiency anemia   ? Chronic bronchitis (HCC)   ? Coronary artery disease 2005  ? DDD (degenerative disc disease), cervical   ? DDD (degenerative disc disease), lumbar   ? Depression   ? states well controlled  ? Diverticulitis of colon   ? GERD (gastroesophageal reflux disease)   ? H/O hiatal hernia   ? Hemorrhoid   ? Hyperlipidemia   ? Hypertension   ?   PCP Dr Lysbeth Galas; pt currently on no medication for treatment   ? Hypothyroidism   ? Pneumonia   ? hx of walking pneumonia  ? Sleep apnea   ? couldnt tolerate machine- states last sleep study " many years ago"  ? Status post primary angioplasty with coronary stent   ? DRUG-ELUTING STENT X1  TO PROXIMAL LAD  ? Synovitis of knee HYPERTROPHIC RIGHT KNEE  ? ? ?PSH: ?Past Surgical History:  ?Procedure Laterality Date  ? APPENDECTOMY  1993  ? CATARACT EXTRACTION W/PHACO Right 05/06/2014  ? Procedure: CATARACT EXTRACTION PHACO AND INTRAOCULAR LENS PLACEMENT (IOC);  Surgeon: Loraine Leriche T. Nile Riggs, MD;  Location: [facility];  Service:  Ophthalmology;  Laterality: Right;  CDE 6.41  ? CATARACT EXTRACTION W/PHACO Left 05/20/2014  ? Procedure: CATARACT EXTRACTION PHACO AND INTRAOCULAR LENS PLACEMENT (IOC);  Surgeon: Loraine Leriche T. Nile Riggs, MD;  Location: [facility];  Service: Ophthalmology;  Laterality: Left;  CDE:4.42  ? COLON SURGERY    ? CORONARY ANGIOPLASTY  2004  ? DRUG-ELUTING STENT X1 TO PROXIMAL  LAD  ? CYSTOSCOPY WITH STENT PLACEMENT Right 04/14/2018  ? Procedure: CYSTOSCOPY WITH STENT PLACEMENT;  Surgeon: Marcine Matar, MD;  Location: [facility];  Service: Urology;  Laterality: Right;  ? EXCISIONAL TOTAL KNEE ARTHROPLASTY WITH ANTIBIOTIC SPACERS Right 09/14/2015  ? Procedure: RIGHT TOTAL KNEE RESSECTION ARTHROPLASTY WITH ANTIBIOTIC SPACERS;  Surgeon: Ollen Gross, MD;  [facility];  Service: Orthopedics;  Laterality: Right;  ? EYE SURGERY    ? HEMICOLECTOMY FOR DIVERTICULITIS  2005  ? HYSTEROSCOPY WITH D & C  2002  ? I & D KNEE WITH POLY EXCHANGE  02/01/2012  ? Procedure: IRRIGATION AND DEBRIDEMENT KNEE WITH POLY EXCHANGE;  Surgeon: Loanne Drilling, MD;  [facility]: [facility];  Service: Orthopedics;  Laterality: Right;  Right Knee Polyethlene Revision   ? IR URETERAL STENT RIGHT NEW ACCESS W/O SEP NEPHROSTOMY CATH  06/01/2018  ? IRRIGATION AND DEBRIDEMENT KNEE Right 03/10/2015  ? Procedure: IRRIGATION AND DEBRIDEMENT of right KNEE;  Surgeon: Ollen Gross, MD;  [facility];  Service: Orthopedics;  Laterality: Right;  ? JOINT REPLACEMENT    ? right knee   ? KNEE ARTHROSCOPY  LEFT 1999  ? RIGHT 2004  ? KNEE ARTHROSCOPY  08/03/2011  ? Procedure: ARTHROSCOPY KNEE;  Surgeon: Loanne DrillingFrank V Aluisio;  Location: [facility];  Service: Orthopedics;  Laterality: Right;  ? KNEE ARTHROSCOPY Right 12/17/2014  ? Procedure: RIGHT ARTHROSCOPY KNEE, SYNOVECTOMY;  Surgeon: Ollen GrossFrank Aluisio, MD;  [facility];  Service: Orthopedics;  Laterality: Right;  ? KNEE ARTHROSCOPY W/ DEBRIDEMENT  01-22-2010  ? SEPTIC KNEE  ? KNEE CLOSED REDUCTION  05-07-2009  RIGHT KNEE   ? LEFT KNEE  10-01-2009  ? LAPAROSCOPIC GASTRIC SLEEVE RESECTION N/A 07/21/2014  ? Procedure: LAPAROSCOPIC GASTRIC SLEEVE RESECTION, TAKEDOWN OF INCARCERATED VENTRAL HERNIA, ENTERAL LYSIS, UPPER ENDOSCOPY;  Surgeon: Valarie MerinoMatthew B Martin, MD;  Location: [facility];  Service: General;  Laterality: N/A;  ? LEFT FOOT SURG.  1990  ? NEPHROLITHOTOMY Right 06/04/2018  ? Procedure: NEPHROLITHOTOMY PERCUTANEOUS;  Surgeon: Marcine Matarahlstedt, Stephen, MD;  [facility];  Service: Urology;  Laterality: Right;  2.5 HRS  ? OPEN PATELLOFEMORAL RIGHT KNEE/ LATERAL RELEASE  05-04-2009  ? QUADRICEPS TENDON REPAIR Right 06/02/2015  ? Procedure: RIGHT KNEE QUADRICEP TENDON REPAIR ;  Surgeon: Ollen GrossFrank Aluisio, MD;  Location: [facility];  Service: Orthopedics;  Laterality: Right;  ? RIGHT KNEE I & D POLYTHYLENE REVISION  02-04-2010  ? KNEE INFECTED  ? SYNOVECTOMY  08/03/2011  ? Procedure: SYNOVECTOMY;  Surgeon: Loanne DrillingFrank V Aluisio;  Location: [facility];  Service: Orthopedics;  Laterality: Right;  ? TOTAL KNEE ARTHROPLASTY  LEFT  2005  ? RIGHT  03-13-2009  ? TOTAL KNEE ARTHROPLASTY Right 11/23/2015  ? Procedure: RIGHT TOTAL KNEE ARTHROPLASTY REIMPLANTATION;  Surgeon: Ollen GrossFrank Aluisio, MD;  Location: [facility];  Service: Orthopedics;  Laterality: Right;  ? TOTAL KNEE REVISION  RIGHT 09-30-2009  ? LEFT 11-25-2009  ? TOTAL KNEE REVISION Right 02/20/2013  ? Procedure: RIGHT TOTAL KNEE ARTHROPLASTY REVISION VERSUS RESECTION ARTHROPLASTY;  Surgeon: Loanne DrillingFrank V Aluisio, MD;  Location: [facility];  Service: Orthopedics;  Laterality: Right;  ? TOTAL KNEE REVISION Right 04/02/2014  ? Procedure: RIGHT KNEE FEMORAL ARTHROPLASTY REVISION;  Surgeon: Loanne DrillingFrank Aluisio V, MD;  Location: [facility];  Service: Orthopedics;  Laterality: Right;  ? ? ?SH: ?Social History  ? ?Tobacco Use  ? Smoking status: Former  ?  Packs/day: 1.00  ?  Years: 30.00  ?  Pack years: 30.00  ?  Types: Cigarettes  ?  Quit date: 07/31/2002  ?  Years since quitting: 19.1  ? Smokeless tobacco: Never  ?Vaping Use  ?  Vaping Use: Never used  ?Substance Use Topics  ? Alcohol use: No  ? Drug use: No  ? ? ?ROS: ?Constitutional:  Negative for fever, chills, weight loss ?CV: Negative for chest pain, previous MI, hypertension ?Respiratory:  Negative for shortness of breath, wheezing, sleep apnea, frequent cough ?GI:  Negative for nausea, vomiting, bloody stool, GERD ? ?PE: ?There were no vitals taken for this visit. ?GENERAL APPEARANCE:  Well appearing, well developed, well nourished, NAD ?HEENT:  Atraumatic, normocephalic, oropharynx clear ?NECK:  Supple without lymphadenopathy or thyromegaly ?ABDOMEN:  Soft, non-tender, no masses ?EXTREMITIES:  Moves all extremities well, without clubbing, cyanosis, or edema ?NEUROLOGIC:  Alert and oriented x 3, normal gait, CN II-XII grossly intact ?MENTAL STATUS:  appropriate ?BACK:  Non-tender to palpation, No CVAT ?SKIN:  Warm, dry, and intact ? ? ?Results: ?No results found for  this or any previous visit (from the past 24 hour(s)). ? ?

## 2021-10-05 ENCOUNTER — Ambulatory Visit: Payer: Medicare Other | Admitting: Physician Assistant

## 2021-10-07 ENCOUNTER — Ambulatory Visit: Payer: Medicare Other | Admitting: Physician Assistant

## 2021-10-11 ENCOUNTER — Ambulatory Visit (INDEPENDENT_AMBULATORY_CARE_PROVIDER_SITE_OTHER): Payer: Medicare Other | Admitting: Physician Assistant

## 2021-10-11 ENCOUNTER — Other Ambulatory Visit: Payer: Self-pay

## 2021-10-11 VITALS — BP 131/84 | HR 75 | Ht 64.0 in | Wt 225.0 lb

## 2021-10-11 DIAGNOSIS — R310 Gross hematuria: Secondary | ICD-10-CM

## 2021-10-11 DIAGNOSIS — N200 Calculus of kidney: Secondary | ICD-10-CM | POA: Diagnosis not present

## 2021-10-11 DIAGNOSIS — N3001 Acute cystitis with hematuria: Secondary | ICD-10-CM | POA: Diagnosis not present

## 2021-10-11 LAB — MICROSCOPIC EXAMINATION
RBC, Urine: >30 /hpf — AB (ref 0–2)
Renal Epithel, UA: NONE SEEN /hpf
WBC, UA: >30 /hpf — AB (ref 0–5)

## 2021-10-11 LAB — URINALYSIS, ROUTINE W REFLEX MICROSCOPIC
Bilirubin, UA: NEGATIVE
Glucose, UA: NEGATIVE
Ketones, UA: NEGATIVE
Nitrite, UA: POSITIVE — AB
Specific Gravity, UA: 1.015 (ref 1.005–1.030)
Urobilinogen, Ur: 0.2 mg/dL (ref 0.2–1.0)
pH, UA: 8.5 — ABNORMAL HIGH (ref 5.0–7.5)

## 2021-10-11 MED ORDER — NITROFURANTOIN MONOHYD MACRO 100 MG PO CAPS: 100.0000 mg | ORAL_CAPSULE | Freq: Two times a day (BID) | ORAL | 0 refills | Status: AC

## 2021-10-11 NOTE — Progress Notes (Signed)
? ?Assessment: ?1. Gross hematuria   ?2. Acute cystitis with hematuria   ?3. Calculus of kidney   ? ? ?Plan: ?Today I had a discussion with the patient regarding the findings of gross hematuria including the implications and differential diagnoses associated with it.  I also discussed recommendations for further evaluation including the rationale for upper tract imaging and cystoscopy.  I discussed the nature of these procedures including potential risk and complications.  The patient expressed an understanding of these issues. ?MDX culture ordered and tx initiated with Macrobid. Potential side effects discussed.  ?  ? ?Chief Complaint: ?No chief complaint on file. ? ? ?HPI: ?Andrea Norton is a 76 y.o. female who was last seen 03/31/20 presents for evaluation of new onset gross hematuria for the past couple of months.  She reports she continues to have persistent UTI symptoms with multiple infections reportedly treated over the phone by her primary care provider without urinalysis results or cultures.  She also reports that she has been following up with Dr. Tereasa Coop at [facility] urology.  No medical records in the epic system from either of these providers.  Today, she also complains of urinary frequency, urgency, burning, occasional incontinence, weakness of stream with need to strain.  No fever, chills, nausea, flank pain, abdominal pain.  No current antibiotics.  She reports no imaging or urologic procedures since her last visit in this office.  No passage of stones or stone procedure for her history of nephrolithiasis since her last visit. ?UA = greater than 30 WBCs, greater than 30 RBCs, many bacteria, calcium oxalate crystals, nitrite positive ? ? ?9.7.2021: Pt denies any interim UTIs. Pt has not had a recent xray or ultrasound to monitor her kidney stones. Pt reports that she has intermittent pain with movement but no associated nausea. Pt submits no other questions or concerns at this time. ?  ?(below  copied from [facility] records): ?  ?Ureteral Stones: ?  ?Andrea Norton is a 76 year-old female established patient who is here for ureteral calculi after a surgical intervention. ?  ?This procedure was done 04/13/2018.  ?  ?10.8.19: She underwent urgent stenting for infected, obstructing right renal pelvic stone on 9.20.2019. She had postoperative hospitalization for IV antibiotic administration. She has completed her oral antibiotics.  ?Stone was 20.1 mm by 13 mm.  ?  ?11.11.2019: PCNL. Stent was left in, she did not have a nephrostomy tube.  ?  ?11.26.2019: She is here for follow-up. She has some discomfort with urination from the stent, no fever, no chills. Occasional gross hematuria. No flank pain.  ?  ?08-24-2018: Her husband died last week. She was recently treated for an infection as well. She is having no flank pain or hematuria. Recent renal ultrasound revealed no hydronephrosis or stone disease.  ?  ?3.10.2020: Here to discuss 24 hr urine results. She did have slightly low urine volume, borderline high urine calcium, very low urine citrate. She has had no recent flank pain, hematuria, voiding issues.  ?  ?3.2.2021: She returns today for follow-up. In the interval she has continued to have recurrent urinary tract infections w/ dysuria, bladder pressure, and increased urgency/freq. She feels as though she is currently infected -- she was started on nitrofurantoin but she feels like this was not effective. She has had her rx changed yet. Most recent scan revealed bilateral renal calculi (these were not present on her previous scan). Recent 24 hr urine revealed low citrate -- she is not currently on  medication for this. ?  ?4.20.2021: She returns today continuing to c/o dysuria, foul-smelling and cloudy urine, fatigue, and urinary freq/urgency that she has had since last seen in office. She also notes having developed vaginal itching/irritation as well as visible redness/hives over the last 2-3 weeks. Her urine culture  was positive at last visit but she was not given abx. ?Portions of the above documentation were copied from a prior visit for review purposes only. ? ?Allergies: ?Allergies  ?Allergen Reactions  ? Oxybutynin Anaphylaxis  ?  Pt states it made her mouth swell   ? Morphine And Related Other (See Comments)  ?  Hallunications, confusion for 1 month  ? ? ?PMH: ?Past Medical History:  ?Diagnosis Date  ? Anxiety   ? Arthritis   ? HANDS, KNEES  ? B12 deficiency anemia   ? Chronic bronchitis (HCC)   ? Coronary artery disease 2005  ? DDD (degenerative disc disease), cervical   ? DDD (degenerative disc disease), lumbar   ? Depression   ? states well controlled  ? Diverticulitis of colon   ? GERD (gastroesophageal reflux disease)   ? H/O hiatal hernia   ? Hemorrhoid   ? Hyperlipidemia   ? Hypertension   ?   PCP Dr Lysbeth GalasNyland; pt currently on no medication for treatment   ? Hypothyroidism   ? Pneumonia   ? hx of walking pneumonia  ? Sleep apnea   ? couldnt tolerate machine- states last sleep study " many years ago"  ? Status post primary angioplasty with coronary stent   ? DRUG-ELUTING STENT X1  TO PROXIMAL LAD  ? Synovitis of knee HYPERTROPHIC RIGHT KNEE  ? ? ?PSH: ?Past Surgical History:  ?Procedure Laterality Date  ? APPENDECTOMY  1993  ? CATARACT EXTRACTION W/PHACO Right 05/06/2014  ? Procedure: CATARACT EXTRACTION PHACO AND INTRAOCULAR LENS PLACEMENT (IOC);  Surgeon: Loraine LericheMark T. Nile RiggsShapiro, MD;  Location: [facility];  Service: Ophthalmology;  Laterality: Right;  CDE 6.41  ? CATARACT EXTRACTION W/PHACO Left 05/20/2014  ? Procedure: CATARACT EXTRACTION PHACO AND INTRAOCULAR LENS PLACEMENT (IOC);  Surgeon: Loraine LericheMark T. Nile RiggsShapiro, MD;  Location: [facility];  Service: Ophthalmology;  Laterality: Left;  CDE:4.42  ? COLON SURGERY    ? CORONARY ANGIOPLASTY  2004  ? DRUG-ELUTING STENT X1 TO PROXIMAL  LAD  ? CYSTOSCOPY WITH STENT PLACEMENT Right 04/14/2018  ? Procedure: CYSTOSCOPY WITH STENT PLACEMENT;  Surgeon: Marcine Matarahlstedt, Stephen, MD;  Location: [facility];   Service: Urology;  Laterality: Right;  ? EXCISIONAL TOTAL KNEE ARTHROPLASTY WITH ANTIBIOTIC SPACERS Right 09/14/2015  ? Procedure: RIGHT TOTAL KNEE RESSECTION ARTHROPLASTY WITH ANTIBIOTIC SPACERS;  Surgeon: Ollen GrossFrank Aluisio, MD;  Location: [facility];  Service: Orthopedics;  Laterality: Right;  ? EYE SURGERY    ? HEMICOLECTOMY FOR DIVERTICULITIS  2005  ? HYSTEROSCOPY WITH D & C  2002  ? I & D KNEE WITH POLY EXCHANGE  02/01/2012  ? Procedure: IRRIGATION AND DEBRIDEMENT KNEE WITH POLY EXCHANGE;  Surgeon: Loanne DrillingFrank V Aluisio, MD;  Location: [facility];  Service: Orthopedics;  Laterality: Right;  Right Knee Polyethlene Revision   ? IR URETERAL STENT RIGHT NEW ACCESS W/O SEP NEPHROSTOMY CATH  06/01/2018  ? IRRIGATION AND DEBRIDEMENT KNEE Right 03/10/2015  ? Procedure: IRRIGATION AND DEBRIDEMENT of right KNEE;  Surgeon: Ollen GrossFrank Aluisio, MD;  Location: [facility];  Service: Orthopedics;  Laterality: Right;  ? JOINT REPLACEMENT    ? right knee   ? KNEE ARTHROSCOPY  LEFT 1999  ? RIGHT 2004  ? KNEE ARTHROSCOPY  08/03/2011  ? Procedure: ARTHROSCOPY KNEE;  Surgeon: Loanne Drilling;  Location: [facility];  Service: Orthopedics;  Laterality: Right;  ? KNEE ARTHROSCOPY Right 12/17/2014  ? Procedure: RIGHT ARTHROSCOPY KNEE, SYNOVECTOMY;  Surgeon: Ollen Gross, MD;  Location: [facility];  Service: Orthopedics;  Laterality: Right;  ? KNEE ARTHROSCOPY W/ DEBRIDEMENT  01-22-2010  ? SEPTIC KNEE  ? KNEE CLOSED REDUCTION  05-07-2009  RIGHT KNEE  ? LEFT KNEE  10-01-2009  ? LAPAROSCOPIC GASTRIC SLEEVE RESECTION N/A 07/21/2014  ? Procedure: LAPAROSCOPIC GASTRIC SLEEVE RESECTION, TAKEDOWN OF INCARCERATED VENTRAL HERNIA, ENTERAL LYSIS, UPPER ENDOSCOPY;  Surgeon: Valarie Merino, MD;  Location: [facility];  Service: General;  Laterality: N/A;  ? LEFT FOOT SURG.  1990  ? NEPHROLITHOTOMY Right 06/04/2018  ? Procedure: NEPHROLITHOTOMY PERCUTANEOUS;  Surgeon: Marcine Matar, MD;  Location: [facility];  Service: Urology;  Laterality: Right;  2.5 HRS  ? OPEN  PATELLOFEMORAL RIGHT KNEE/ LATERAL RELEASE  05-04-2009  ? QUADRICEPS TENDON REPAIR Right 06/02/2015  ? Procedure: RIGHT KNEE QUADRICEP TENDON REPAIR ;  Surgeon: Ollen Gross, MD;  Location: [facility];  Service: [place] Pare

## 2021-10-12 ENCOUNTER — Ambulatory Visit (HOSPITAL_COMMUNITY)
Admission: RE | Admit: 2021-10-12 | Discharge: 2021-10-12 | Disposition: A | Discharge status: Home / Self Care | Payer: Medicare Other | Source: Ambulatory Visit | Attending: Physician Assistant | Admitting: Physician Assistant

## 2021-10-12 ENCOUNTER — Encounter (HOSPITAL_COMMUNITY): Payer: Self-pay | Admitting: Radiology

## 2021-10-12 DIAGNOSIS — R310 Gross hematuria: Secondary | ICD-10-CM | POA: Insufficient documentation

## 2021-10-12 DIAGNOSIS — N200 Calculus of kidney: Secondary | ICD-10-CM | POA: Diagnosis present

## 2021-10-12 LAB — POCT I-STAT CREATININE: Creatinine, Ser: 1 mg/dL (ref 0.44–1.00)

## 2021-10-12 MED ORDER — IOHEXOL 300 MG/ML  SOLN
100.0000 mL | Freq: Once | INTRAMUSCULAR | Status: AC | PRN
  Administered 2021-10-12: 100 mL via INTRAVENOUS

## 2021-10-13 ENCOUNTER — Other Ambulatory Visit: Payer: Self-pay | Admitting: Physician Assistant

## 2021-10-14 ENCOUNTER — Other Ambulatory Visit: Payer: Self-pay | Admitting: Physician Assistant

## 2021-10-14 DIAGNOSIS — N3000 Acute cystitis without hematuria: Secondary | ICD-10-CM

## 2021-10-14 MED ORDER — SULFAMETHOXAZOLE-TRIMETHOPRIM 800-160 MG PO TABS: 1.0000 | ORAL_TABLET | Freq: Two times a day (BID) | ORAL | 0 refills | Status: DC

## 2021-10-14 NOTE — Progress Notes (Signed)
MDX culture indicated pain resistance to most antibiotics with sensitivities to third-generation cephalosporins, Levaquin, Bactrim.  Patient will discontinue Macrobid and start 7 days of Bactrim DS twice daily ?

## 2021-10-15 ENCOUNTER — Telehealth: Payer: Self-pay

## 2021-10-15 NOTE — Telephone Encounter (Signed)
Patient called and made aware.

## 2021-10-15 NOTE — Telephone Encounter (Signed)
-----   Message from Sydnee Levans, [place] sent at 10/14/2021  8:57 AM EDT ----- ?Please let the pt know that her MDX cx grew more than one bacteria and she needs a different antibx. She needs to DC Macrobid and new Rx for Bactrim DS sent to her pharmacy.  ?----- Message ----- ?From: Ferdinand Lango, RN ?Sent: 10/13/2021   4:54 PM EDT ?To: [facility], LPN, # ? ?MDX urine culture  ? ?

## 2021-10-25 NOTE — Progress Notes (Incomplete)
? ?History of Present Illness: Andrea Norton is a 76 y.o. year old female *** ? ?Past Medical History:  ?Diagnosis Date  ? Anxiety   ? Arthritis   ? HANDS, KNEES  ? B12 deficiency anemia   ? Chronic bronchitis (HCC)   ? Coronary artery disease 2005  ? DDD (degenerative disc disease), cervical   ? DDD (degenerative disc disease), lumbar   ? Depression   ? states well controlled  ? Diverticulitis of colon   ? GERD (gastroesophageal reflux disease)   ? H/O hiatal hernia   ? Hemorrhoid   ? Hyperlipidemia   ? Hypertension   ?   PCP Dr Lysbeth Galas; pt currently on no medication for treatment   ? Hypothyroidism   ? Pneumonia   ? hx of walking pneumonia  ? Sleep apnea   ? couldnt tolerate machine- states last sleep study " many years ago"  ? Status post primary angioplasty with coronary stent   ? DRUG-ELUTING STENT X1  TO PROXIMAL LAD  ? Synovitis of knee HYPERTROPHIC RIGHT KNEE  ? ? ?Past Surgical History:  ?Procedure Laterality Date  ? APPENDECTOMY  1993  ? CATARACT EXTRACTION W/PHACO Right 05/06/2014  ? Procedure: CATARACT EXTRACTION PHACO AND INTRAOCULAR LENS PLACEMENT (IOC);  Surgeon: Loraine Leriche T. Nile Riggs, MD;  Location: [facility];  Service: Ophthalmology;  Laterality: Right;  CDE 6.41  ? CATARACT EXTRACTION W/PHACO Left 05/20/2014  ? Procedure: CATARACT EXTRACTION PHACO AND INTRAOCULAR LENS PLACEMENT (IOC);  Surgeon: Loraine Leriche T. Nile Riggs, MD;  Location: [facility];  Service: Ophthalmology;  Laterality: Left;  CDE:4.42  ? COLON SURGERY    ? CORONARY ANGIOPLASTY  2004  ? DRUG-ELUTING STENT X1 TO PROXIMAL  LAD  ? CYSTOSCOPY WITH STENT PLACEMENT Right 04/14/2018  ? Procedure: CYSTOSCOPY WITH STENT PLACEMENT;  Surgeon: Marcine Matar, MD;  Location: [facility];  Service: Urology;  Laterality: Right;  ? EXCISIONAL TOTAL KNEE ARTHROPLASTY WITH ANTIBIOTIC SPACERS Right 09/14/2015  ? Procedure: RIGHT TOTAL KNEE RESSECTION ARTHROPLASTY WITH ANTIBIOTIC SPACERS;  Surgeon: Ollen Gross, MD;  Location: [facility];  Service: Orthopedics;  Laterality:  Right;  ? EYE SURGERY    ? HEMICOLECTOMY FOR DIVERTICULITIS  2005  ? HYSTEROSCOPY WITH D & C  2002  ? I & D KNEE WITH POLY EXCHANGE  02/01/2012  ? Procedure: IRRIGATION AND DEBRIDEMENT KNEE WITH POLY EXCHANGE;  Surgeon: Loanne Drilling, MD;  Location: [facility];  Service: Orthopedics;  Laterality: Right;  Right Knee Polyethlene Revision   ? IR URETERAL STENT RIGHT NEW ACCESS W/O SEP NEPHROSTOMY CATH  06/01/2018  ? IRRIGATION AND DEBRIDEMENT KNEE Right 03/10/2015  ? Procedure: IRRIGATION AND DEBRIDEMENT of right KNEE;  Surgeon: Ollen Gross, MD;  Location: [facility];  Service: Orthopedics;  Laterality: Right;  ? JOINT REPLACEMENT    ? right knee   ? KNEE ARTHROSCOPY  LEFT 1999  ? RIGHT 2004  ? KNEE ARTHROSCOPY  08/03/2011  ? Procedure: ARTHROSCOPY KNEE;  Surgeon: Loanne Drilling;  Location: [facility];  Service: Orthopedics;  Laterality: Right;  ? KNEE ARTHROSCOPY Right 12/17/2014  ? Procedure: RIGHT ARTHROSCOPY KNEE, SYNOVECTOMY;  Surgeon: Ollen Gross, MD;  [facility]: [facility];  Service: Orthopedics;  Laterality: Right;  ? KNEE ARTHROSCOPY W/ DEBRIDEMENT  01-22-2010  ? SEPTIC KNEE  ? KNEE CLOSED REDUCTION  05-07-2009  RIGHT KNEE  ? LEFT KNEE  10-01-2009  ? LAPAROSCOPIC GASTRIC SLEEVE RESECTION N/A 07/21/2014  ? Procedure: LAPAROSCOPIC GASTRIC SLEEVE RESECTION, TAKEDOWN OF INCARCERATED VENTRAL HERNIA, ENTERAL LYSIS, UPPER  ENDOSCOPY;  Surgeon: Valarie Merino, MD;  Location[facility];  Service: General;  Laterality: N/A;  ? LEFT FOOT SURG.  1990  ? NEPHROLITHOTOMY Right 06/04/2018  ? Procedure: NEPHROLITHOTOMY PERCUTANEOUS;  Surgeon: Marcine Matar, MD;  Location[facility];  Service: Urology;  Laterality: Right;  2.5 HRS  ? OPEN PATELLOFEMORAL RIGHT KNEE/ LATERAL RELEASE  05-04-2009  ? QUADRICEPS TENDON REPAIR Right 06/02/2015  ? Procedure: RIGHT KNEE QUADRICEP TENDON REPAIR ;  Surgeon: Ollen Gross, MD;  Location: [facility];  Service: Orthopedics;  Laterality: Right;  ? RIGHT KNEE I & D POLYTHYLENE REVISION   02-04-2010  ? KNEE INFECTED  ? SYNOVECTOMY  08/03/2011  ? Procedure: SYNOVECTOMY;  Surgeon: Loanne Drilling;  Location: [facility];  Service: Orthopedics;  Laterality: Right;  ? TOTAL KNEE ARTHROPLASTY  LEFT  2005  ? RIGHT  03-13-2009  ? TOTAL KNEE ARTHROPLASTY Right 11/23/2015  ? Procedure: RIGHT TOTAL KNEE ARTHROPLASTY REIMPLANTATION;  Surgeon: Ollen Gross, MD;  Location: [facility];  Service: Orthopedics;  Laterality: Right;  ? TOTAL KNEE REVISION  RIGHT 09-30-2009  ? LEFT 11-25-2009  ? TOTAL KNEE REVISION Right 02/20/2013  ? Procedure: RIGHT TOTAL KNEE ARTHROPLASTY REVISION VERSUS RESECTION ARTHROPLASTY;  Surgeon: Loanne Drilling, MD;  Location: [facility];  Service: Orthopedics;  Laterality: Right;  ? TOTAL KNEE REVISION Right 04/02/2014  ? Procedure: RIGHT KNEE FEMORAL ARTHROPLASTY REVISION;  Surgeon: Loanne Drilling, MD;  Location: [facility];  Service: Orthopedics;  Laterality: Right;  ? ? ?Home Medications:  ?(Not in a hospital admission) ? ? ?Allergies:  ?Allergies  ?Allergen Reactions  ? Oxybutynin Anaphylaxis  ?  Pt states it made her mouth swell   ? Morphine And Related Other (See Comments)  ?  Hallunications, confusion for 1 month  ? ? ?Family History  ?Problem Relation Age of Onset  ? COPD Mother   ? Cancer Father   ?     lung  ? ? ?Social History:  reports that she quit smoking about 19 years ago. Her smoking use included cigarettes. She has a 30.00 pack-year smoking history. She has never used smokeless tobacco. She reports that she does not drink alcohol and does not use drugs. ? ?ROS: ?A complete review of systems was performed.  All systems are negative except for pertinent findings as noted. ? ?Physical Exam:  ?Vital signs in last 24 hours: ?@VSRANGES @ ?General:  Alert and oriented, No acute distress ?HEENT: Normocephalic, atraumatic ?Neck: No JVD or lymphadenopathy ?Cardiovascular: Regular rate  ?Lungs: Normal inspiratory/expiratory excursion ?Abdomen: Soft, nontender, nondistended, no abdominal  masses ?Back: No CVA tenderness ?Extremities: No edema ?Neurologic: Grossly intact ? ?I have reviewed prior pt notes ? ?I have reviewed urinalysis results ? ?I have independently reviewed prior imaging ? ?I have reviewed prior urine culture  ? ?Impression/Assessment:  ?*** ? ?Plan:  ?*** ? ? Talor Desrosiers ?10/25/2021, 8:09 PM  ?12/25/2021. Marykay Mccleod MD ? ? ?

## 2021-10-26 ENCOUNTER — Telehealth: Payer: Self-pay

## 2021-10-26 ENCOUNTER — Other Ambulatory Visit: Payer: Medicare Other | Admitting: Urology

## 2021-10-26 DIAGNOSIS — N200 Calculus of kidney: Secondary | ICD-10-CM

## 2021-10-26 DIAGNOSIS — R310 Gross hematuria: Secondary | ICD-10-CM

## 2021-10-26 NOTE — Telephone Encounter (Signed)
Patient called requesting the results from her scan.  Please advise on any update. ?

## 2021-10-28 NOTE — Telephone Encounter (Signed)
Patient aware.

## 2021-11-16 ENCOUNTER — Other Ambulatory Visit: Payer: Medicare Other | Admitting: Urology

## 2022-08-05 ENCOUNTER — Telehealth: Payer: Self-pay

## 2022-08-05 NOTE — Telephone Encounter (Signed)
Pt was mailed a letter to advise to chang pcp with insurance or to make an appointment to be seen with [facility].   Veniamin Kincaid RMA  

## 2022-08-14 NOTE — Progress Notes (Deleted)
Cardiology Office Note   Date:  08/14/2022   ID:  Andrea, Norton Oct 20, 1945, MRN [id number]  PCP:  Chesley Noon, MD  Cardiologist:   None   No chief complaint on file.      History of Present Illness: Andrea Norton is a 77 y.o. female who was referred by Chesley Noon, MD for evaluation of CAD   She does have a history of DES to the LAD in 2004.  Echo in 2019 demonstrated NL LVEF.  There were no significant valvular abnormalities.  This was at the time of sepsis with a kidney stone and she had an elevated troponin.  ***  Since I last saw her ***  ***  she has been more bothered by her back and her knee and she gets around for the most part wheelchair or very slowly along with a walker.  She lives at home with her grandson.  Her son and her husband died.  The patient denies any new symptoms such as chest discomfort, neck or arm discomfort. There has been no new shortness of breath, PND or orthopnea. There have been no reported palpitations, presyncope or syncope.   Past Medical History:  Diagnosis Date   Anxiety    Arthritis    HANDS, KNEES   B12 deficiency anemia    Chronic bronchitis (HCC)    Coronary artery disease 2005   DDD (degenerative disc disease), cervical    DDD (degenerative disc disease), lumbar    Depression    states well controlled   Diverticulitis of colon    GERD (gastroesophageal reflux disease)    H/O hiatal hernia    Hemorrhoid    Hyperlipidemia    Hypertension      PCP Dr Edrick Oh; pt currently on no medication for treatment    Hypothyroidism    Pneumonia    hx of walking pneumonia   Sleep apnea    couldnt tolerate machine- states last sleep study " many years ago"   Status post primary angioplasty with coronary stent    DRUG-ELUTING STENT X1  TO PROXIMAL LAD   Synovitis of knee HYPERTROPHIC RIGHT KNEE    Past Surgical History:  Procedure Laterality Date   APPENDECTOMY  1993   CATARACT EXTRACTION W/PHACO Right  05/06/2014   Procedure: CATARACT EXTRACTION PHACO AND INTRAOCULAR LENS PLACEMENT ([place]);  Surgeon: Elta Guadeloupe T. Gershon Crane, MD;  Location: [facility];  Service: Ophthalmology;  Laterality: Right;  CDE 6.41   CATARACT EXTRACTION W/PHACO Left 05/20/2014   Procedure: CATARACT EXTRACTION PHACO AND INTRAOCULAR LENS PLACEMENT ([facility]);  Surgeon: Elta Guadeloupe T. Gershon Crane, MD;  Location: [facility];  Service: Ophthalmology;  Laterality: Left;  CDE:4.42   COLON SURGERY     CORONARY ANGIOPLASTY  2004   DRUG-ELUTING STENT X1 TO PROXIMAL  LAD   CYSTOSCOPY WITH STENT PLACEMENT Right 04/14/2018   Procedure: CYSTOSCOPY WITH STENT PLACEMENT;  Surgeon: Franchot Gallo, MD;  Location: [facility];  Service: Urology;  Laterality: Right;   EXCISIONAL TOTAL KNEE ARTHROPLASTY WITH ANTIBIOTIC SPACERS Right 09/14/2015   Procedure: RIGHT TOTAL KNEE RESSECTION ARTHROPLASTY WITH ANTIBIOTIC SPACERS;  Surgeon: Gaynelle Arabian, MD;  Location: [facility];  Service: Orthopedics;  Laterality: Right;   EYE SURGERY     HEMICOLECTOMY FOR DIVERTICULITIS  2005   HYSTEROSCOPY WITH D & C  2002   I & D KNEE WITH POLY EXCHANGE  02/01/2012   Procedure: IRRIGATION AND DEBRIDEMENT KNEE WITH POLY EXCHANGE;  Surgeon: Gearlean Alf, MD;  Location: [facility];  Service: Orthopedics;  Laterality: Right;  Right Knee Polyethlene Revision    IR URETERAL STENT RIGHT NEW ACCESS W/O SEP NEPHROSTOMY CATH  06/01/2018   IRRIGATION AND DEBRIDEMENT KNEE Right 03/10/2015   Procedure: IRRIGATION AND DEBRIDEMENT of right KNEE;  Surgeon: Gaynelle Arabian, MD;  Location: [facility];  Service: Orthopedics;  Laterality: Right;   JOINT REPLACEMENT     right knee    KNEE ARTHROSCOPY  LEFT 1999   RIGHT 2004   KNEE ARTHROSCOPY  08/03/2011   Procedure: ARTHROSCOPY KNEE;  Surgeon: Gearlean Alf;  Location: [place];  Service: Orthopedics;  Laterality: Right;   KNEE ARTHROSCOPY Right 12/17/2014   Procedure: RIGHT ARTHROSCOPY KNEE, SYNOVECTOMY;  Surgeon: Gaynelle Arabian, MD;  Location: [facility];   Service: Orthopedics;  Laterality: Right;   KNEE ARTHROSCOPY W/ DEBRIDEMENT  01-22-2010   SEPTIC KNEE   KNEE CLOSED REDUCTION  05-07-2009  RIGHT KNEE   LEFT KNEE  10-01-2009   LAPAROSCOPIC GASTRIC SLEEVE RESECTION N/A 07/21/2014   Procedure: LAPAROSCOPIC GASTRIC SLEEVE RESECTION, TAKEDOWN OF INCARCERATED VENTRAL HERNIA, ENTERAL LYSIS, UPPER ENDOSCOPY;  Surgeon: Pedro Earls, MD;  Location: [facility];  Service: General;  Laterality: N/A;   LEFT FOOT SURG.  1990   NEPHROLITHOTOMY Right 06/04/2018   Procedure: NEPHROLITHOTOMY PERCUTANEOUS;  Surgeon: Franchot Gallo, MD;  Location: [facility];  Service: Urology;  Laterality: Right;  2.5 HRS   OPEN PATELLOFEMORAL RIGHT KNEE/ LATERAL RELEASE  05-04-2009   QUADRICEPS TENDON REPAIR Right 06/02/2015   Procedure: RIGHT KNEE QUADRICEP TENDON REPAIR ;  Surgeon: Gaynelle Arabian, MD;  Location: [facility];  Service: Orthopedics;  Laterality: Right;   RIGHT KNEE I & D POLYTHYLENE REVISION  02-04-2010   KNEE INFECTED   SYNOVECTOMY  08/03/2011   Procedure: SYNOVECTOMY;  Surgeon: Dione Plover Aluisio;  Location: [place];  Service: Orthopedics;  Laterality: Right;   TOTAL KNEE ARTHROPLASTY  LEFT  2005   RIGHT  03-13-2009   TOTAL KNEE ARTHROPLASTY Right 11/23/2015   Procedure: RIGHT TOTAL KNEE ARTHROPLASTY REIMPLANTATION;  Surgeon: Gaynelle Arabian, MD;  Location: [facility];  Service: Orthopedics;  Laterality: Right;   TOTAL KNEE REVISION  RIGHT 09-30-2009   LEFT 11-25-2009   TOTAL KNEE REVISION Right 02/20/2013   Procedure: RIGHT TOTAL KNEE ARTHROPLASTY REVISION VERSUS RESECTION ARTHROPLASTY;  Surgeon: Gearlean Alf, MD;  Location: [facility];  Service: Orthopedics;  Laterality: Right;   TOTAL KNEE REVISION Right 04/02/2014   Procedure: RIGHT KNEE FEMORAL ARTHROPLASTY REVISION;  Surgeon: Gearlean Alf, MD;  Location: [facility];  Service: Orthopedics;  Laterality: Right;     Current Outpatient Medications  Medication Sig Dispense Refill   acetaminophen (TYLENOL)  500 MG tablet Take 1,000 mg by mouth every 6 (six) hours as needed for moderate pain or headache.      aspirin EC 81 MG tablet Take 81 mg by mouth at bedtime.      cyanocobalamin (,VITAMIN B-12,) 1000 MCG/ML injection Inject 1,000 mcg into the muscle every 30 (thirty) days.      escitalopram (LEXAPRO) 10 MG tablet Take 15 mg by mouth daily with lunch.      gabapentin (NEURONTIN) 100 MG capsule Take 300 mg by mouth 2 (two) times daily.      levothyroxine (SYNTHROID) 75 MCG tablet Take 75 mcg by mouth daily.     meloxicam (MOBIC) 7.5 MG tablet Take 7.5 mg by mouth 2 (two) times daily.      pantoprazole (PROTONIX) 40 MG  tablet Take 40 mg by mouth daily with lunch.      pravastatin (PRAVACHOL) 40 MG tablet Take 1 tablet (40 mg total) by mouth every evening. 90 tablet 3   sulfamethoxazole-trimethoprim (BACTRIM DS) 800-160 MG tablet Take 1 tablet by mouth every 12 (twelve) hours. 14 tablet 0   Vitamin D, Ergocalciferol, (DRISDOL) 50000 units CAPS capsule Take 50,000 Units by mouth every Friday.     No current facility-administered medications for this visit.   Facility-Administered Medications Ordered in Other Visits  Medication Dose Route Frequency Provider Last Rate Last Admin   tranexamic acid (CYKLOKAPRON) 2,000 mg in sodium chloride 0.9 % 50 mL Topical Application  123XX123 mg Topical Once Porterfield, Amber, PA-C        Allergies:   Oxybutynin and Morphine and related    ROS:  Please see the history of present illness.   Otherwise, review of systems are positive for ***.   All other systems are reviewed and negative.    PHYSICAL EXAM: VS:  There were no vitals taken for this visit. , BMI There is no height or weight on file to calculate BMI. GENERAL:  Well appearing NECK:  No jugular venous distention, waveform within normal limits, carotid upstroke brisk and symmetric, no bruits, no thyromegaly LUNGS:  Clear to auscultation bilaterally CHEST:  Unremarkable HEART:  PMI not displaced or  sustained,S1 and S2 within normal limits, no S3, no S4, no clicks, no rubs, *** murmurs ABD:  Flat, positive bowel sounds normal in frequency in pitch, no bruits, no rebound, no guarding, no midline pulsatile mass, no hepatomegaly, no splenomegaly EXT:  2 plus pulses throughout, no edema, no cyanosis no clubbing     ***GEN:  No distress NECK:  No jugular venous distention at 90 degrees, waveform within normal limits, carotid upstroke brisk and symmetric, left  bruits, no thyromegaly LYMPHATICS:  No cervical adenopathy LUNGS:  Clear to auscultation bilaterally BACK:  No CVA tenderness CHEST:  Unremarkable HEART:  S1 and S2 within normal limits, no S3, no S4, no clicks, no rubs, no murmurs ABD:  Positive bowel sounds normal in frequency in pitch, no bruits, no rebound, no guarding, unable to assess midline mass or bruit with the patient seated. EXT:  2 plus pulses throughout, moderate edema, no cyanosis no clubbing SKIN:  No rashes no nodules NEURO:  Cranial nerves II through XII grossly intact, motor grossly intact throughout PSYCH:  Cognitively intact, oriented to person place and time   EKG:  EKG is ***ordered today. The ekg ordered today demonstrates sinus rhythm, rate ***, axis within normal limits, intervals within normal limits, no acute ST-T wave changes.   Recent Labs: 10/12/2021: Creatinine, Ser 1.00    Lipid Panel    Component Value Date/Time   CHOL 114 07/12/2021 1348   TRIG 89 07/12/2021 1348   HDL 55 07/12/2021 1348   CHOLHDL 2.1 07/12/2021 1348   LDLCALC 42 07/12/2021 1348      Wt Readings from Last 3 Encounters:  10/11/21 225 lb (102.1 kg)  06/23/21 220 lb (99.8 kg)  03/31/20 207 lb (93.9 kg)      Other studies Reviewed: Additional studies/ records that were reviewed today include: ***. Review of the above records demonstrates:  ***   ASSESSMENT AND PLAN:  CAD:   ***  he patient has no new sypmtoms.  No further cardiovascular testing is indicated.   We will continue with aggressive risk reduction and meds as listed.   HTN : Her blood  pressure is *** at target.  No change in therapy.   DYSLIPIDEMIA:  ***  She previously was on simvastatin but does not know what happened to this.  She has not been at target with her lipids in the past.  I would like her to be at least on pravastatin and I will start this at 40 mg daily and check a lipid profile in 10 weeks.   Current medicines are reviewed at length with the patient today.  The patient does not have concerns regarding medicines.  The following changes have been made:  ***  Labs/ tests ordered today include: ***  No orders of the defined types were placed in this encounter.     Disposition:   FU with me in *** months.     Signed, Minus Breeding, MD  08/14/2022 9:55 PM    [facility]

## 2022-08-17 ENCOUNTER — Ambulatory Visit: Payer: Medicare Other | Admitting: Cardiology

## 2022-08-17 DIAGNOSIS — E785 Hyperlipidemia, unspecified: Secondary | ICD-10-CM

## 2022-08-17 DIAGNOSIS — I10 Essential (primary) hypertension: Secondary | ICD-10-CM

## 2022-08-17 DIAGNOSIS — I2510 Atherosclerotic heart disease of native coronary artery without angina pectoris: Secondary | ICD-10-CM

## 2022-09-13 NOTE — Progress Notes (Signed)
Error

## 2022-10-24 NOTE — Progress Notes (Unsigned)
  Cardiology Office Note:   Date:  10/26/2022  ID:  Andrea Norton, DOB 03-Dec-1945, MRN [id number]  History of Present Illness:   Andrea Norton is a 77 y.o. female  who was referred by Chesley Noon, MD for evaluation of CAD   I saw her years ago and she does have a history of DES to the LAD in 2004.  She has not been seen in the office since 2010.  Echo in 2019 demonstrated NL LVEF.  There were no significant valvular abnormalities.  This was at the time of sepsis with a kidney stone and she had an elevated troponin.    She was just in the emergency room again with cystitis I reviewed these records for this visit.  He had no new cardiac complaints. The patient denies any new symptoms such as chest discomfort, neck or arm discomfort. There has been no new shortness of breath, PND or orthopnea. There have been no reported palpitations, presyncope or syncope.  She gets around slowly with a walker.  ROS: Falls, knee pain, back pain.  Otherwise negative for all other systems.  Studies Reviewed:    EKG: Normal sinus rhythm, rate 77, axis within normal limits, intervals within normal limits, no acute ST-T wave changes.  Risk Assessment/Calculations:              Physical Exam:   VS:  BP 120/82   Pulse 77   Ht 5\' 4"  (1.626 m)   Wt 211 lb (95.7 kg)   BMI 36.22 kg/m    Wt Readings from Last 3 Encounters:  10/26/22 211 lb (95.7 kg)  10/11/21 225 lb (102.1 kg)  06/23/21 220 lb (99.8 kg)     GEN: Well nourished, well developed in no acute distress NECK: No JVD; No carotid bruits CARDIAC: RRR, no murmurs, rubs, gallops RESPIRATORY:  Clear to auscultation without rales, wheezing or rhonchi  ABDOMEN: Soft, non-tender, non-distended EXTREMITIES:  Positive mild edema; No deformity   ASSESSMENT AND PLAN:   CAD:   The patient has no new sypmtoms.  No further cardiovascular testing is indicated.  We will continue with aggressive risk reduction and meds as listed.  HTN : Her blood is  at target.  No change in therapy.   DYSLIPIDEMIA: Her LDL was 42 in 2022.  I am going to ask her to get this checked again the results sent to me.  No change in therapy.      Signed, Minus Breeding, MD

## 2022-10-26 ENCOUNTER — Encounter: Payer: Self-pay | Admitting: Cardiology

## 2022-10-26 ENCOUNTER — Ambulatory Visit (INDEPENDENT_AMBULATORY_CARE_PROVIDER_SITE_OTHER): Payer: Medicare HMO | Admitting: Cardiology

## 2022-10-26 VITALS — BP 120/82 | HR 77 | Ht 64.0 in | Wt 211.0 lb

## 2022-10-26 DIAGNOSIS — I2510 Atherosclerotic heart disease of native coronary artery without angina pectoris: Secondary | ICD-10-CM

## 2022-10-26 DIAGNOSIS — E785 Hyperlipidemia, unspecified: Secondary | ICD-10-CM

## 2022-10-26 DIAGNOSIS — I10 Essential (primary) hypertension: Secondary | ICD-10-CM | POA: Diagnosis not present

## 2022-10-26 NOTE — Patient Instructions (Signed)
Medication Instructions:  The current medical regimen is effective;  continue present plan and medications.  *If you need a refill on your cardiac medications before your next appointment, please call your pharmacy*  Follow-Up: At [facility], you and your health needs are our priority.  As part of our continuing mission to provide you with exceptional heart care, we have created designated Provider Care Teams.  These Care Teams include your primary Cardiologist (physician) and Advanced Practice Providers (APPs -  Physician Assistants and Nurse Practitioners) who all work together to provide you with the care you need, when you need it.  We recommend signing up for the patient portal called "MyChart".  Sign up information is provided on this After Visit Summary.  MyChart is used to connect with patients for Virtual Visits (Telemedicine).  Patients are able to view lab/test results, encounter notes, upcoming appointments, etc.  Non-urgent messages can be sent to your provider as well.   To learn more about what you can do with MyChart, go to [email or web address].    Your next appointment:   1 year(s)  Provider:   James Hochrein, MD     

## 2023-12-07 ENCOUNTER — Ambulatory Visit: Payer: Medicare HMO | Admitting: Obstetrics and Gynecology

## 2024-01-08 NOTE — Progress Notes (Deleted)
  Cardiology Office Note:   Date:  01/08/2024  ID:  Andrea Norton, DOB 1945/10/21, MRN [id number] PCP: Andrea Handsome, MD  [facility] Providers Cardiologist:  None {  History of Present Illness:   Andrea Norton is a 78 y.o. female who has a history of DES to the LAD in 2004. Echo in 2019 demonstrated NL LVEF.  There were no significant valvular abnormalities.  This was at the time of sepsis with a kidney stone and she had an elevated troponin.    ***   ***  She was just in the emergency room again with cystitis I reviewed these records for this visit.  He had no new cardiac complaints. The patient denies any new symptoms such as chest discomfort, neck or arm discomfort. There has been no new shortness of breath, PND or orthopnea. There have been no reported palpitations, presyncope or syncope.  She gets around slowly with a walker.  ROS: ***  Studies Reviewed:    EKG:       ***  Risk Assessment/Calculations:   {Does this patient have ATRIAL FIBRILLATION?[phone]} No BP recorded.  {Refresh Note OR Click here to enter BP  :1}***        Physical Exam:   VS:  There were no vitals taken for this visit.   Wt Readings from Last 3 Encounters:  10/26/22 211 lb (95.7 kg)  10/11/21 225 lb (102.1 kg)  06/23/21 220 lb (99.8 kg)     GEN: Well nourished, well developed in no acute distress NECK: No JVD; No carotid bruits CARDIAC: ***RR, *** murmurs, rubs, gallops RESPIRATORY:  Clear to auscultation without rales, wheezing or rhonchi  ABDOMEN: Soft, non-tender, non-distended EXTREMITIES:  No edema; No deformity   ASSESSMENT AND PLAN:   CAD:  ***  The patient has no new sypmtoms.  No further cardiovascular testing is indicated.  We will continue with aggressive risk reduction and meds as listed.   HTN : Her blood is ***   at target.  No change in therapy.    DYSLIPIDEMIA: Her LDL was ***  42 in 2022.  I am going to ask her to get this checked again the results  sent to me.  No change in therapy.     Follow up ***  Signed, Eilleen Grates, MD

## 2024-01-10 ENCOUNTER — Ambulatory Visit: Admitting: Cardiology

## 2024-01-10 DIAGNOSIS — E785 Hyperlipidemia, unspecified: Secondary | ICD-10-CM

## 2024-01-10 DIAGNOSIS — I2510 Atherosclerotic heart disease of native coronary artery without angina pectoris: Secondary | ICD-10-CM

## 2024-01-10 DIAGNOSIS — I10 Essential (primary) hypertension: Secondary | ICD-10-CM

## 2024-07-01 ENCOUNTER — Other Ambulatory Visit: Payer: Self-pay

## 2024-07-01 ENCOUNTER — Observation Stay (HOSPITAL_COMMUNITY)

## 2024-07-01 ENCOUNTER — Observation Stay (HOSPITAL_COMMUNITY)
Admission: EM | Admit: 2024-07-01 | Discharge: 2024-07-02 | Disposition: A | Attending: Family Medicine | Admitting: Family Medicine

## 2024-07-01 ENCOUNTER — Encounter (HOSPITAL_COMMUNITY): Payer: Self-pay

## 2024-07-01 ENCOUNTER — Emergency Department (HOSPITAL_COMMUNITY)

## 2024-07-01 DIAGNOSIS — K56600 Partial intestinal obstruction, unspecified as to cause: Principal | ICD-10-CM

## 2024-07-01 DIAGNOSIS — N3000 Acute cystitis without hematuria: Secondary | ICD-10-CM

## 2024-07-01 DIAGNOSIS — K529 Noninfective gastroenteritis and colitis, unspecified: Secondary | ICD-10-CM | POA: Diagnosis present

## 2024-07-01 LAB — CBC WITH DIFFERENTIAL/PLATELET
Abs Immature Granulocytes: 0.02 K/uL (ref 0.00–0.07)
Basophils Absolute: 0 K/uL (ref 0.0–0.1)
Basophils Relative: 1 %
Eosinophils Absolute: 0.2 K/uL (ref 0.0–0.5)
Eosinophils Relative: 2 %
HCT: 40.2 % (ref 36.0–46.0)
Hemoglobin: 13.3 g/dL (ref 12.0–15.0)
Immature Granulocytes: 0 %
Lymphocytes Relative: 14 %
Lymphs Abs: 1 K/uL (ref 0.7–4.0)
MCH: 29 pg (ref 26.0–34.0)
MCHC: 33.1 g/dL (ref 30.0–36.0)
MCV: 87.6 fL (ref 80.0–100.0)
Monocytes Absolute: 0.5 K/uL (ref 0.1–1.0)
Monocytes Relative: 7 %
Neutro Abs: 5.4 K/uL (ref 1.7–7.7)
Neutrophils Relative %: 76 %
Platelets: 214 K/uL (ref 150–400)
RBC: 4.59 MIL/uL (ref 3.87–5.11)
RDW: 13.7 % (ref 11.5–15.5)
WBC: 7.1 K/uL (ref 4.0–10.5)
nRBC: 0 % (ref 0.0–0.2)

## 2024-07-01 LAB — COMPREHENSIVE METABOLIC PANEL WITH GFR
ALT: 10 U/L (ref 0–44)
AST: 21 U/L (ref 15–41)
Albumin: 3.8 g/dL (ref 3.5–5.0)
Alkaline Phosphatase: 74 U/L (ref 38–126)
Anion gap: 13 (ref 5–15)
BUN: 13 mg/dL (ref 8–23)
CO2: 21 mmol/L — ABNORMAL LOW (ref 22–32)
Calcium: 9.4 mg/dL (ref 8.9–10.3)
Chloride: 106 mmol/L (ref 98–111)
Creatinine, Ser: 0.55 mg/dL (ref 0.44–1.00)
GFR, Estimated: >60 mL/min (ref >60)
Glucose, Bld: 116 mg/dL — ABNORMAL HIGH (ref 70–99)
Potassium: 3.7 mmol/L (ref 3.5–5.1)
Sodium: 140 mmol/L (ref 135–145)
Total Bilirubin: 1 mg/dL (ref 0.0–1.2)
Total Protein: 6.9 g/dL (ref 6.5–8.1)

## 2024-07-01 LAB — URINALYSIS, ROUTINE W REFLEX MICROSCOPIC
Bacteria, UA: NONE SEEN
Bilirubin Urine: NEGATIVE
Glucose, UA: NEGATIVE mg/dL
Hgb urine dipstick: NEGATIVE
Ketones, ur: NEGATIVE mg/dL
Leukocytes,Ua: NEGATIVE
Nitrite: POSITIVE — AB
Protein, ur: NEGATIVE mg/dL
Specific Gravity, Urine: 1.024 (ref 1.005–1.030)
pH: 7 (ref 5.0–8.0)

## 2024-07-01 LAB — LIPASE, BLOOD: Lipase: 18 U/L (ref 11–51)

## 2024-07-01 MED ORDER — BISACODYL 10 MG RE SUPP
10.0000 mg | Freq: Once | RECTAL | Status: AC
  Administered 2024-07-01: 10 mg via RECTAL
  Filled 2024-07-01: qty 1

## 2024-07-01 MED ORDER — PRAVASTATIN SODIUM 40 MG PO TABS: 40.0000 mg | ORAL_TABLET | Freq: Every evening | ORAL | Status: DC

## 2024-07-01 MED ORDER — ACETAMINOPHEN 500 MG PO TABS
500.0000 mg | ORAL_TABLET | Freq: Four times a day (QID) | ORAL | Status: DC | PRN
  Administered 2024-07-01 – 2024-07-02 (×2): 500 mg via ORAL
  Filled 2024-07-01 (×2): qty 1

## 2024-07-01 MED ORDER — ENOXAPARIN SODIUM 40 MG/0.4ML IJ SOSY
40.0000 mg | PREFILLED_SYRINGE | INTRAMUSCULAR | Status: DC
  Administered 2024-07-01 – 2024-07-02 (×2): 40 mg via SUBCUTANEOUS
  Filled 2024-07-01 (×2): qty 0.4

## 2024-07-01 MED ORDER — MELATONIN 3 MG PO TABS: 6.0000 mg | ORAL_TABLET | Freq: Every evening | ORAL | Status: DC | PRN

## 2024-07-01 MED ORDER — OXYCODONE HCL 5 MG PO TABS
5.0000 mg | ORAL_TABLET | ORAL | Status: DC | PRN
  Administered 2024-07-01: 5 mg via ORAL
  Filled 2024-07-01: qty 1

## 2024-07-01 MED ORDER — SODIUM CHLORIDE 0.9 % IV SOLN
1.0000 g | Freq: Once | INTRAVENOUS | Status: AC
  Administered 2024-07-01: 1 g via INTRAVENOUS
  Filled 2024-07-01: qty 10

## 2024-07-01 MED ORDER — FENTANYL CITRATE (PF) 100 MCG/2ML IJ SOLN
50.0000 ug | Freq: Once | INTRAMUSCULAR | Status: AC
  Administered 2024-07-01: 50 ug via INTRAVENOUS
  Filled 2024-07-01: qty 2

## 2024-07-01 MED ORDER — PROCHLORPERAZINE EDISYLATE 10 MG/2ML IJ SOLN: 5.0000 mg | Freq: Four times a day (QID) | INTRAMUSCULAR | Status: DC | PRN

## 2024-07-01 MED ORDER — DIATRIZOATE MEGLUMINE & SODIUM 66-10 % PO SOLN
90.0000 mL | Freq: Once | ORAL | Status: DC
  Filled 2024-07-01 (×2): qty 90

## 2024-07-01 MED ORDER — IOHEXOL 300 MG/ML  SOLN
100.0000 mL | Freq: Once | INTRAMUSCULAR | Status: AC | PRN
  Administered 2024-07-01: 100 mL via INTRAVENOUS

## 2024-07-01 MED ORDER — SODIUM CHLORIDE 0.9 % IV SOLN
1.0000 g | INTRAVENOUS | Status: DC
  Administered 2024-07-01: 1 g via INTRAVENOUS
  Filled 2024-07-01: qty 10

## 2024-07-01 MED ORDER — POLYETHYLENE GLYCOL 3350 17 G PO PACK
17.0000 g | PACK | Freq: Every day | ORAL | Status: DC | PRN
  Administered 2024-07-02: 17 g via ORAL
  Filled 2024-07-01: qty 1

## 2024-07-01 MED ORDER — LEVOTHYROXINE SODIUM 75 MCG PO TABS
75.0000 ug | ORAL_TABLET | Freq: Every day | ORAL | Status: DC
  Administered 2024-07-01 – 2024-07-02 (×2): 75 ug via ORAL
  Filled 2024-07-01 (×2): qty 1

## 2024-07-01 MED ORDER — HYDROMORPHONE HCL 1 MG/ML IJ SOLN
0.5000 mg | INTRAMUSCULAR | Status: DC | PRN
  Administered 2024-07-01: 0.5 mg via INTRAVENOUS
  Filled 2024-07-01: qty 0.5

## 2024-07-01 MED ORDER — LACTATED RINGERS IV SOLN: INTRAVENOUS | Status: DC

## 2024-07-01 MED ORDER — ROSUVASTATIN CALCIUM 20 MG PO TABS
20.0000 mg | ORAL_TABLET | Freq: Every day | ORAL | Status: DC
  Administered 2024-07-01 – 2024-07-02 (×2): 20 mg via ORAL
  Filled 2024-07-01 (×2): qty 1

## 2024-07-01 MED ORDER — ONDANSETRON HCL 4 MG/2ML IJ SOLN
4.0000 mg | Freq: Once | INTRAMUSCULAR | Status: AC
  Administered 2024-07-01: 4 mg via INTRAVENOUS
  Filled 2024-07-01: qty 2

## 2024-07-01 MED ORDER — ASPIRIN 81 MG PO TBEC
81.0000 mg | DELAYED_RELEASE_TABLET | Freq: Every day | ORAL | Status: DC
  Administered 2024-07-01: 81 mg via ORAL
  Filled 2024-07-01: qty 1

## 2024-07-01 NOTE — H&P (Addendum)
 History and Physical  Andrea Norton FMW:[id number] DOB: [birth year] DOA: 07/01/2024  Referring physician: Dr. Geroldine, EDP  PCP: Sophronia Ozell BROCKS, MD  Outpatient Specialists: Gynecology. Patient coming from: Home.  Chief Complaint: Nausea, vomiting, abdominal pain, and burning with urination.  HPI: Andrea Norton is a 78 y.o. female with medical history significant for coronary artery disease status post PCI with stent placement on aspirin , hyperlipidemia, hypothyroidism, obesity, OSA, not tolerant of CPAP, history of diverticulitis status post partial bowel resection, who presented to the ER with complaints of epigastric pain, generalized abdominal cramping and vomiting after eating lunch today.  Also endorses pain with urination.  In the ER, significantly hypertensive with SBP of 207, not on home oral antihypertensives, tachypneic with respiration rate of 22.  Lab studies notable for serum bicarb 21.  CBC with differentials was unremarkable.  CT abdomen and pelvis with contrast revealed short segment distal small bowel infectious or inflammatory enteritis resulting in partial small bowel obstruction.  Mild ascites.  Right lateral abdominal wall broad-based hernia containing several loops of unremarkable distal small bowel and ascending colon.  Gas within the bladder lumen, nonspecific, consider urinalysis and urine culture to evaluate for infection if no recent instrumentation.  Mild hepatic steatosis.  UA was positive for nitrite with UA WBC 0-5.  The patient received 1 dose of Rocephin  1 g x 1, IV Zofran  4 mg x 1, and IV fentanyl  50 mcg x 1.  EDP requesting admission for further management of enteritis, partial small bowel obstruction, UTI.  Admitted by [facility], hospitalist service.  ED Course: Temperature 98.1.  BP 176/83, pulse 73, respiratory rate 13, O2 saturation 96% on room air.  Review of Systems: Review of systems as noted in the HPI. All other systems reviewed and are  negative.   Past Medical History:  Diagnosis Date   Anxiety    Arthritis    HANDS, KNEES   B12 deficiency anemia    Chronic bronchitis (HCC)    Coronary artery disease 2005   DDD (degenerative disc disease), cervical    DDD (degenerative disc disease), lumbar    Depression    states well controlled   Diverticulitis of colon    GERD (gastroesophageal reflux disease)    H/O hiatal hernia    Hemorrhoid    Hyperlipidemia    Hypertension      PCP Dr Wells; pt currently on no medication for treatment    Hypothyroidism    Pneumonia    hx of walking pneumonia   Sleep apnea    couldnt tolerate machine- states last sleep study  many years ago   Status post primary angioplasty with coronary stent    DRUG-ELUTING STENT X1  TO PROXIMAL LAD   Synovitis of knee HYPERTROPHIC RIGHT KNEE   Past Surgical History:  Procedure Laterality Date   APPENDECTOMY  1993   CATARACT EXTRACTION W/PHACO Right 05/06/2014   Procedure: CATARACT EXTRACTION PHACO AND INTRAOCULAR LENS PLACEMENT (IOC);  Surgeon: Oneil T. Roz, MD;  Location: [facility];  Service: Ophthalmology;  Laterality: Right;  CDE 6.41   CATARACT EXTRACTION W/PHACO Left 05/20/2014   Procedure: CATARACT EXTRACTION PHACO AND INTRAOCULAR LENS PLACEMENT (IOC);  Surgeon: Oneil T. Roz, MD;  Location: [facility];  Service: Ophthalmology;  Laterality: Left;  CDE:4.42   COLON SURGERY     CORONARY ANGIOPLASTY  2004   DRUG-ELUTING STENT X1 TO PROXIMAL  LAD   CYSTOSCOPY WITH STENT PLACEMENT Right 04/14/2018   Procedure: CYSTOSCOPY WITH STENT PLACEMENT;  Surgeon:  Matilda Senior, MD;  Location: [facility];  Service: Urology;  Laterality: Right;   EXCISIONAL TOTAL KNEE ARTHROPLASTY WITH ANTIBIOTIC SPACERS Right 09/14/2015   Procedure: RIGHT TOTAL KNEE RESSECTION ARTHROPLASTY WITH ANTIBIOTIC SPACERS;  Surgeon: Dempsey Moan, MD;  Location: [facility];  Service: Orthopedics;  Laterality: Right;   EYE SURGERY     HEMICOLECTOMY FOR DIVERTICULITIS  2005    HYSTEROSCOPY WITH D & C  2002   I & D KNEE WITH POLY EXCHANGE  02/01/2012   Procedure: IRRIGATION AND DEBRIDEMENT KNEE WITH POLY EXCHANGE;  Surgeon: Dempsey LULLA Moan, MD;  Location: [facility];  Service: Orthopedics;  Laterality: Right;  Right Knee Polyethlene Revision    IR URETERAL STENT RIGHT NEW ACCESS W/O SEP NEPHROSTOMY CATH  06/01/2018   IRRIGATION AND DEBRIDEMENT KNEE Right 03/10/2015   Procedure: IRRIGATION AND DEBRIDEMENT of right KNEE;  Surgeon: Dempsey Moan, MD;  Location: [facility];  Service: Orthopedics;  Laterality: Right;   JOINT REPLACEMENT     right knee    KNEE ARTHROSCOPY  LEFT 1999   RIGHT 2004   KNEE ARTHROSCOPY  08/03/2011   Procedure: ARTHROSCOPY KNEE;  Surgeon: Dempsey LULLA Moan;  Location: [facility];  Service: Orthopedics;  Laterality: Right;   KNEE ARTHROSCOPY Right 12/17/2014   Procedure: RIGHT ARTHROSCOPY KNEE, SYNOVECTOMY;  Surgeon: Dempsey Moan, MD;  Location: [facility];  Service: Orthopedics;  Laterality: Right;   KNEE ARTHROSCOPY W/ DEBRIDEMENT  01-22-2010   SEPTIC KNEE   KNEE CLOSED REDUCTION  05-07-2009  RIGHT KNEE   LEFT KNEE  10-01-2009   LAPAROSCOPIC GASTRIC SLEEVE RESECTION N/A 07/21/2014   Procedure: LAPAROSCOPIC GASTRIC SLEEVE RESECTION, TAKEDOWN OF INCARCERATED VENTRAL HERNIA, ENTERAL LYSIS, UPPER ENDOSCOPY;  Surgeon: Donnice KATHEE Lunger, MD;  Location: [facility];  Service: General;  Laterality: N/A;   LEFT FOOT SURG.  1990   NEPHROLITHOTOMY Right 06/04/2018   Procedure: NEPHROLITHOTOMY PERCUTANEOUS;  Surgeon: Matilda Senior, MD;  Location: [facility];  Service: Urology;  Laterality: Right;  2.5 HRS   OPEN PATELLOFEMORAL RIGHT KNEE/ LATERAL RELEASE  05-04-2009   QUADRICEPS TENDON REPAIR Right 06/02/2015   Procedure: RIGHT KNEE QUADRICEP TENDON REPAIR ;  Surgeon: Dempsey Moan, MD;  Location: [facility];  Service: Orthopedics;  Laterality: Right;   RIGHT KNEE I & D POLYTHYLENE REVISION  02-04-2010   KNEE INFECTED   SYNOVECTOMY  08/03/2011   Procedure:  SYNOVECTOMY;  Surgeon: Dempsey LULLA Aluisio;  Location: [facility];  Service: Orthopedics;  Laterality: Right;   TOTAL KNEE ARTHROPLASTY  LEFT  2005   RIGHT  03-13-2009   TOTAL KNEE ARTHROPLASTY Right 11/23/2015   Procedure: RIGHT TOTAL KNEE ARTHROPLASTY REIMPLANTATION;  Surgeon: Dempsey Moan, MD;  Location: [facility];  Service: Orthopedics;  Laterality: Right;   TOTAL KNEE REVISION  RIGHT 09-30-2009   LEFT 11-25-2009   TOTAL KNEE REVISION Right 02/20/2013   Procedure: RIGHT TOTAL KNEE ARTHROPLASTY REVISION VERSUS RESECTION ARTHROPLASTY;  Surgeon: Dempsey LULLA Moan, MD;  Location: [facility];  Service: Orthopedics;  Laterality: Right;   TOTAL KNEE REVISION Right 04/02/2014   Procedure: RIGHT KNEE FEMORAL ARTHROPLASTY REVISION;  Surgeon: Dempsey Moan LULLA, MD;  Location: [facility];  Service: Orthopedics;  Laterality: Right;    Social History:  reports that she quit smoking about 21 years ago. Her smoking use included cigarettes. She started smoking about 51 years ago. She has a 30 pack-year smoking history. She has never used smokeless tobacco. She reports that she does not drink alcohol and does not use drugs.  Allergies  Allergen Reactions   Oxybutynin Anaphylaxis    Pt states it made her mouth swell    Morphine  And Codeine Other (See Comments)    Hallunications, confusion for 1 month    Family History  Problem Relation Age of Onset   COPD Mother    Cancer Father        lung      Prior to Admission medications   Medication Sig Start Date End Date Taking? Authorizing Provider  acetaminophen  (TYLENOL ) 500 MG tablet Take 1,000 mg by mouth every 6 (six) hours as needed for moderate pain or headache.     [provider]  aspirin  EC 81 MG tablet Take 81 mg by mouth at bedtime.     [provider]  cyanocobalamin (,VITAMIN B-12,) 1000 MCG/ML injection Inject 1,000 mcg into the muscle every 30 (thirty) days.  11/16/16   [provider]  escitalopram  (LEXAPRO ) 10 MG  tablet Take 15 mg by mouth daily with lunch.     [provider]  gabapentin  (NEURONTIN ) 100 MG capsule Take 300 mg by mouth 2 (two) times daily.     [provider]  levothyroxine  (SYNTHROID ) 75 MCG tablet Take 75 mcg by mouth daily. 05/28/21   [provider]  meloxicam (MOBIC) 7.5 MG tablet Take 7.5 mg by mouth 2 (two) times daily.     [provider]  pravastatin  (PRAVACHOL ) 40 MG tablet Take 1 tablet (40 mg total) by mouth every evening. 06/23/21   Lavona Agent, MD  sulfamethoxazole -trimethoprim  (BACTRIM  DS) 800-160 MG tablet Take 1 tablet by mouth every 12 (twelve) hours. 10/14/21   Summerlin, Julienne Annette, PA-C  Vitamin D, Ergocalciferol, (DRISDOL) 50000 units CAPS capsule Take 50,000 Units by mouth every Friday.    [provider]    Physical Exam: BP (!) 176/83   Pulse 73   Temp 98.1 F (36.7 C) (Oral)   Resp 13   Ht 5' 4 (1.626 m)   Wt 95.7 kg   SpO2 96%   BMI 36.21 kg/m   General: 78 y.o. year-old female well developed well nourished in no acute distress.  Alert and oriented x3. Cardiovascular: Regular rate and rhythm with no rubs or gallops.  No thyromegaly or JVD noted.  No lower extremity edema. 2/4 pulses in all 4 extremities. Respiratory: Clear to auscultation with no wheezes or rales. Good inspiratory effort. Abdomen: Soft diffusely tender nondistended with hypoactive bowel sounds x4 quadrants. Muskuloskeletal: No cyanosis, clubbing or edema noted bilaterally Neuro: CN II-XII intact, strength, sensation, reflexes Skin: No ulcerative lesions noted or rashes Psychiatry: Judgement and insight appear normal. Mood is appropriate for condition and setting          Labs on Admission:  Basic Metabolic Panel: Recent Labs  Lab 07/01/24 0132  NA 140  K 3.7  CL 106  CO2 21*  GLUCOSE 116*  BUN 13  CREATININE 0.55  CALCIUM  9.4   Liver Function Tests: Recent Labs  Lab 07/01/24 0132  AST 21  ALT 10  ALKPHOS 74   BILITOT 1.0  PROT 6.9  ALBUMIN 3.8   Recent Labs  Lab 07/01/24 0132  LIPASE 18   No results for input(s): AMMONIA in the last 168 hours. CBC: Recent Labs  Lab 07/01/24 0132  WBC 7.1  NEUTROABS 5.4  HGB 13.3  HCT 40.2  MCV 87.6  PLT 214   Cardiac Enzymes: No results for input(s): CKTOTAL, CKMB, CKMBINDEX, TROPONINI in the last 168 hours.  BNP (  last 3 results) No results for input(s): BNP in the last 8760 hours.  ProBNP (last 3 results) No results for input(s): PROBNP in the last 8760 hours.  CBG: No results for input(s): GLUCAP in the last 168 hours.  Radiological Exams on Admission: CT ABDOMEN PELVIS W CONTRAST Result Date: 07/01/2024 EXAM: CT ABDOMEN AND PELVIS WITH CONTRAST 07/01/2024 02:14:56 AM TECHNIQUE: CT of the abdomen and pelvis was performed with the administration of 100 mL of iohexol  (OMNIPAQUE ) 300 MG/ML solution. Multiplanar reformatted images are provided for review. Automated exposure control, iterative reconstruction, and/or weight-based adjustment of the mA/kV was utilized to reduce the radiation dose to as low as reasonably achievable. COMPARISON: None available. CLINICAL HISTORY: Abdominal pain, acute, nonlocalized. FINDINGS: LOWER CHEST: No acute abnormality. LIVER: Mild hepatic steatosis. No enhancing intrahepatic mass. GALLBLADDER AND BILE DUCTS: Gallbladder is unremarkable. No biliary ductal dilatation. SPLEEN: No acute abnormality. PANCREAS: No acute abnormality. ADRENAL GLANDS: No acute abnormality. KIDNEYS, URETERS AND BLADDER: Multiple simple parapelvic cysts are seen within the left kidney, for which no follow-up imaging is recommended. The kidneys are otherwise unremarkable. No stones in the kidneys or ureters. No hydronephrosis. No perinephric or periureteral stranding. Gas is seen non-dependently within the bladder lumen, which is nonspecific and may relate to recent instrumentation. Less likely, this can be seen in the setting of  aggressive infection and correlation with urinalysis and urine culture may be helpful for further management. GI AND BOWEL: Surgical changes of gastric sleeve resection are identified. Surgical changes of appendectomy and sigmoid colectomy are identified. There is circumferential bowel wall thickening with associated mesenteric edema involving a focal loop of distal small bowel within the right lower quadrant (series 54, image 2; series 46, image 4), consistent with a focal area of infectious or inflammatory enteritis. There is a resultant partial small bowel obstruction with food-filled, mildly dilated proximal small bowel with fecalization of intraluminal contents in keeping with stasis. PERITONEUM AND RETROPERITONEUM: Mild ascites has developed. No free intraperitoneal gas. VASCULATURE: Aorta is normal in caliber. Extensive aortoiliac atherosclerotic calcifications. LYMPH NODES: No lymphadenopathy. REPRODUCTIVE ORGANS: Multiple calcifications in the uterine fundus are likely related to the presence of underlying involuted uterine fibroids. The pelvic organs are otherwise unremarkable. BONES AND SOFT TISSUES: Osseous structures are age-appropriate. No acute bone abnormality. No lytic or blastic bone lesion. Ventral hernia repair with mesh has been performed. Right lateral abdominal wall broad-based hernia is seen containing several loops of distal small bowel as well as the ascending colon. IMPRESSION: 1. Short segment distal small-bowel infectious or inflammatory enteritis resulting in partial small-bowel obstruction. Mild ascites. 2. Right lateral abdominal wall broad-based hernia containing several loops of unremarkable distal small bowel and the ascending colon. 3. Gas within the bladder lumen, nonspecific; consider urinalysis and urine culture to evaluate for infection if no recent instrumentation. 4. Mild hepatic steatosis. Electronically signed by: Dorethia Molt MD 07/01/2024 02:26 AM EST RP Workstation:  [id number]    EKG: I independently viewed the EKG done and my findings are as followed: None available at the time of this visit.  Assessment/Plan Present on Admission:  Enteritis  Principal Problem:   Enteritis  Enteritis, inflammatory versus infectious Continue supportive care IV fluid hydration LR at 100 cc/h x 1 day IV antiemetics as needed IV analgesics as needed Optimize magnesium and potassium levels Pain control as needed.  Presumed UTI, POA Presents with dysuria and nitrite positive urine analysis Started on Rocephin  in the ER, continue Follow urine culture for ID and sensitivities De-escalate  antibiotics when able.  Partial small bowel obstruction Continue supportive care as stated above Monitor for flatulence and bowel movement Continue IV fluid hydration Replete electrolytes as indicated Early mobilization is recommended. Dulcolax suppository 10 mg x 1 P.O. Gastrografin  added Abdominal xray timed at 1400 (07/01/24) NPO except meds and ice chips, until symptoms are improved  Coronary artery disease status post PCI with stent placement No reported anginal symptoms Resume home aspirin  and pravastatin .  Hyperlipidemia Resume home pravastatin   Hypothyroidism Resume home levothyroxine   OSA Not tolerant of CPAP  Obesity BMI 36 Recommend weight loss outpatient with regular physical activity and healthy dieting.   Time: 75 minutes.   DVT prophylaxis: Subcu Lovenox  daily.  Code Status: Full code.  Family Communication: None at bedside.  Disposition Plan: Admitted to MedSurg unit.  Consults called: None.  Admission status: Observation status.   Status is: Observation    Terry LOISE Hurst MD [facility] Pager [phone]  If 7PM-7AM, please contact night-coverage [email or web address] Password [email or web address]  07/01/2024, 3:04 AM

## 2024-07-01 NOTE — ED Notes (Signed)
 Patient given ice chips.

## 2024-07-01 NOTE — ED Notes (Signed)
 Patient transported to CT

## 2024-07-01 NOTE — Progress Notes (Signed)
 [facility] PROGRESS NOTE  Anglea Gordner (DOB: [birth year]) FMW:[id number] PCP: Sophronia Ozell BROCKS, MD  Brief Narrative: Andrea Norton is a 78 y.o. female with a history of CAD s/p DES to LAD 2004, HLD, OSA not on CPAP, hypothyroidism, frequent UTIs, diverticulitis s/p hemicolectomy 2005, appendectomy 1993, incarcerated ventral hernia s/p takedown, gastric sleeve resection, LOA 2015 who presented to the ED just after midnight on 12/8 with abdominal cramping and an episode of vomiting as well as dysuria. She had a tender abdomen without peritonitis and reassuring labs and vital signs. CT abd/pelvis showed a short segment of distal small bowel thickening with partial SBO as well as gas in bladder. IVF, antiemetics, analgesics and ceftriaxone  were given and she was admitted this morning for SBO and UTI.   Subjective: Feels better, ready to try liquids. Abdominal pain is improved, not gone. Still +dysuria typical of prior UTIs. No fevers. Spouse at bedside.  Objective: BP (!) 177/87 (BP Location: Left Arm)   Pulse 67   Temp 98.1 F (36.7 C) (Oral)   Resp 17   Ht 5' 4 (1.626 m)   Wt 95.7 kg   SpO2 99%   BMI 36.21 kg/m   Gen: No distress Pulm: Clear, nonlabored  CV: RRR, no MRG GI: Soft, tender to deep epigastric palpation, no rebound or guarding, hypoactive BS.  Neuro: Alert and oriented. No new focal deficits. Ext: Warm, no deformities Skin: No rashes, lesions or ulcers on visualized skin   Assessment & Plan: Partial SBO: Due to infectious vs. inflammatory enteritis. Has complex abdominal surgical history. Despite current stability, warrants observation and slow diet advancement.  - Does not require NG tube at this time, was given gastrografin  with plans for repeat XR with 8 hour delay.  - Can probably start CLD and advance slowly as tolerated supported with IVF, IV analgesics, IV antiemetics prn.  - Will recommend GI follow up (vs. consultation if not progressing).    Recurrent UTI:  - With gas in lumen of bladder despite no instrumentation, hx UTI's, current dysuria, we will complete treatment for cystitis, continue ceftriaxone . Note UA without pyuria or bacteriuria, but is nitrite positive, so micro may have missed some bacteria.   Otherwise, per H&P this AM by Dr. Shona.   Bernardino KATHEE Come, MD [facility] [email or web address] 07/01/2024, 11:41 AM

## 2024-07-01 NOTE — ED Provider Notes (Addendum)
 [place] EMERGENCY DEPARTMENT AT [facility] Provider Note   CSN: [id number] Arrival date & time: 07/01/24  0038     Patient presents with: Abdominal Pain   Andrea Norton is a 78 y.o. female.   Patient is a 78 year old female presenting with complaints of upper abdominal pain.  Symptoms began around lunchtime after eating soup and a sandwich at a restaurant.  She is felt nauseated and had 1 episode of vomiting.  She denies any diarrhea.  No fevers or chills.  Patient has had prior hernia repair, but denies other abdominal surgeries.  She does report some dysuria and is concerned she may have a UTI.       Prior to Admission medications   Medication Sig Start Date End Date Taking? Authorizing Provider  acetaminophen  (TYLENOL ) 500 MG tablet Take 1,000 mg by mouth every 6 (six) hours as needed for moderate pain or headache.     [provider]  aspirin  EC 81 MG tablet Take 81 mg by mouth at bedtime.     [provider]  cyanocobalamin (,VITAMIN B-12,) 1000 MCG/ML injection Inject 1,000 mcg into the muscle every 30 (thirty) days.  11/16/16   [provider]  escitalopram  (LEXAPRO ) 10 MG tablet Take 15 mg by mouth daily with lunch.     [provider]  gabapentin  (NEURONTIN ) 100 MG capsule Take 300 mg by mouth 2 (two) times daily.     [provider]  levothyroxine  (SYNTHROID ) 75 MCG tablet Take 75 mcg by mouth daily. 05/28/21   [provider]  meloxicam (MOBIC) 7.5 MG tablet Take 7.5 mg by mouth 2 (two) times daily.     [provider]  pravastatin  (PRAVACHOL ) 40 MG tablet Take 1 tablet (40 mg total) by mouth every evening. 06/23/21   Lavona Agent, MD  sulfamethoxazole -trimethoprim  (BACTRIM  DS) 800-160 MG tablet Take 1 tablet by mouth every 12 (twelve) hours. 10/14/21   Summerlin, Julienne Annette, PA-C  Vitamin D, Ergocalciferol, (DRISDOL) 50000 units CAPS capsule Take 50,000 Units by mouth every Friday.     [provider]    Allergies: Oxybutynin and Morphine  and codeine    Review of Systems  All other systems reviewed and are negative.   Updated Vital Signs BP (!) 207/84 (BP Location: Right Arm)   Pulse 71   Temp 98.1 F (36.7 C) (Oral)   Resp 18   Ht 5' 4 (1.626 m)   Wt 95.7 kg   SpO2 97%   BMI 36.21 kg/m   Physical Exam Vitals and nursing note reviewed.  Constitutional:      General: She is not in acute distress.    Appearance: She is well-developed. She is not diaphoretic.  HENT:     Head: Normocephalic and atraumatic.  Cardiovascular:     Rate and Rhythm: Normal rate and regular rhythm.     Heart sounds: No murmur heard.    No friction rub. No gallop.  Pulmonary:     Effort: Pulmonary effort is normal. No respiratory distress.     Breath sounds: Normal breath sounds. No wheezing.  Abdominal:     General: Bowel sounds are normal. There is no distension.     Palpations: Abdomen is soft.     Tenderness: There is abdominal tenderness in the right upper quadrant and epigastric area. There is no right CVA tenderness, left CVA tenderness, guarding or rebound.  Musculoskeletal:        General: Normal range of motion.  Cervical back: Normal range of motion and neck supple.  Skin:    General: Skin is warm and dry.  Neurological:     General: No focal deficit present.     Mental Status: She is alert and oriented to person, place, and time.     (all labs ordered are listed, but only abnormal results are displayed) Labs Reviewed - No data to display  EKG: None  Radiology: No results found.   Procedures   Medications Ordered in the ED  ondansetron  (ZOFRAN ) injection 4 mg (has no administration in time range)  fentaNYL  (SUBLIMAZE ) injection 50 mcg (has no administration in time range)                                    Medical Decision Making Amount and/or Complexity of Data Reviewed Labs: ordered. Radiology: ordered.  Risk Prescription  drug management.   Patient is a 78 year old female presenting with abdominal pain and vomiting as described in the HPI.  Patient arrives here with stable vital signs and is afebrile.  Physical examination reveals tenderness to the epigastric region, but no peritoneal signs.  Laboratory studies obtained including CBC, CMP, and lipase, all of which are basically unremarkable.  There is no leukocytosis, no elevation of liver or pancreatic enzymes, and no electrolyte derangement.  CT scan of the abdomen and pelvis obtained showing a short segment distal small bowel infectious or inflammatory process resulting in partial small bowel obstruction.  There is also gas within the bladder lumen concerning for UTI.  Patient has been given IV fluids along with fentanyl  for pain and Zofran  for nausea and seems to be feeling somewhat better.  She was also given Rocephin  for the urinalysis and CT scan bladder findings.  Given the findings on CT scan I feel that admission would be most appropriate for observation and hopeful resolution of the small bowel obstruction.  I have discussed with the hospitalist who agrees to admit.     Final diagnoses:  None    ED Discharge Orders     None          Geroldine Berg, MD 07/01/24 0300    Geroldine Berg, MD 07/01/24 9698

## 2024-07-01 NOTE — Progress Notes (Signed)
 Transition of Care Department [facility]) has reviewed patient and no other TOC needs have been identified at this time. We will continue to monitor patient advancement through interdisciplinary progression rounds. If new patient transition needs arise, please place a TOC consult.   07/01/24 0745  TOC Brief Assessment  Insurance and Status Reviewed  Patient has primary care physician Yes  Home environment has been reviewed Grandson lives with pt.  Prior level of function: Independent.  Prior/Current Home Services No current home services  Social Drivers of Health Review SDOH reviewed no interventions necessary  Readmission risk has been reviewed Yes  Transition of care needs no transition of care needs at this time

## 2024-07-01 NOTE — ED Triage Notes (Signed)
 Pt to ED from home with c/o abd pain that started after eating [employer] today pt vomited once after eating, says that eased up pain a little. No other episodes of vomiting. No diarrhea. No fever.

## 2024-07-01 NOTE — Plan of Care (Signed)

## 2024-07-01 NOTE — Care Management Obs Status (Signed)
 MEDICARE OBSERVATION STATUS NOTIFICATION   Patient Details  Name: Andrea Norton MRN: [id number] Date of Birth: Dec 18, 1945   Medicare Observation Status Notification Given:  Yes    [name] L Weltha Cathy 07/01/2024, 4:31 PM

## 2024-07-02 LAB — CBC
HCT: 35 % — ABNORMAL LOW (ref 36.0–46.0)
Hemoglobin: 11.3 g/dL — ABNORMAL LOW (ref 12.0–15.0)
MCH: 28.7 pg (ref 26.0–34.0)
MCHC: 32.3 g/dL (ref 30.0–36.0)
MCV: 88.8 fL (ref 80.0–100.0)
Platelets: 200 K/uL (ref 150–400)
RBC: 3.94 MIL/uL (ref 3.87–5.11)
RDW: 13.9 % (ref 11.5–15.5)
WBC: 5.6 K/uL (ref 4.0–10.5)
nRBC: 0 % (ref 0.0–0.2)

## 2024-07-02 LAB — BASIC METABOLIC PANEL WITH GFR
Anion gap: 4 — ABNORMAL LOW (ref 5–15)
BUN: 10 mg/dL (ref 8–23)
CO2: 30 mmol/L (ref 22–32)
Calcium: 8.6 mg/dL — ABNORMAL LOW (ref 8.9–10.3)
Chloride: 108 mmol/L (ref 98–111)
Creatinine, Ser: 0.53 mg/dL (ref 0.44–1.00)
GFR, Estimated: >60 mL/min (ref >60)
Glucose, Bld: 84 mg/dL (ref 70–99)
Potassium: 3.9 mmol/L (ref 3.5–5.1)
Sodium: 142 mmol/L (ref 135–145)

## 2024-07-02 LAB — PHOSPHORUS: Phosphorus: 3.2 mg/dL (ref 2.5–4.6)

## 2024-07-02 LAB — MAGNESIUM: Magnesium: 2 mg/dL (ref 1.7–2.4)

## 2024-07-02 MED ORDER — ONDANSETRON HCL 4 MG PO TABS: 4.0000 mg | ORAL_TABLET | Freq: Every day | ORAL | 0 refills | Status: AC | PRN

## 2024-07-02 MED ORDER — CEPHALEXIN 500 MG PO CAPS: 500.0000 mg | ORAL_CAPSULE | Freq: Two times a day (BID) | ORAL | 0 refills | Status: AC

## 2024-07-02 NOTE — Plan of Care (Signed)

## 2024-07-02 NOTE — Discharge Summary (Signed)
 Physician Discharge Summary   Patient: Andrea Norton MRN: [id number] DOB: 07-14-1946  Admit date:     07/01/2024  Discharge date: 07/02/24  Discharge Physician: Bernardino KATHEE Come   PCP: Sophronia Ozell BROCKS, MD   Recommendations at discharge:   Follow up with PCP in 1-2 weeks  Discharge Diagnoses: Principal Problem:   Enteritis  Hospital Course: Andrea Norton is a 78 y.o. female with a history of CAD s/p DES to LAD 2004, HLD, OSA not on CPAP, hypothyroidism, frequent UTIs, diverticulitis s/p hemicolectomy 2005, appendectomy 1993, incarcerated ventral hernia s/p takedown, gastric sleeve resection, LOA 2015 who presented to the ED just after midnight on 12/8 with abdominal cramping and an episode of vomiting as well as dysuria. She had a tender abdomen without peritonitis and reassuring labs and vital signs. CT abd/pelvis showed a short segment of distal small bowel thickening with partial SBO as well as gas in bladder. IVF, antiemetics, analgesics and ceftriaxone  were given and she was admitted for SBO and UTI. With treatment she improved and is stable for discharge.  Assessment and Plan: Partial SBO: Due to infectious vs. inflammatory enteritis. Has complex abdominal surgical history. Has remained stable, tolerating CLD > FLD and has benign exam.  - Continue prn zofran  - Will recommend GI follow up     Recurrent UTI: GNRs on UCx at time of discharge. Symptoms improving, received CTX x2 doses, will complete course with keflex  to complete 7 days pending finalized culture results.  - Urine Cx 09/24/2019 was pansensitive E. coli.  - Blood and urine Cx 04/13/2018 was E. coli resistant to Amp, Cipro , Bactrim .  - Urine Cx 02/12/2013 was K. pneumoniae pansensitive - Urine Cx 01/22/2010 was K. pneumonia (only 15k colonies) with amp, bactrim  resistance, int. to macrobid .  - Urine Cx 05/11/2009 pansensitive E. coli - Follow up with urology, Dr. Matilda. Has hx obstructing stone, though CT this  admit shows normal kidneys without stones/hydro.   - With gas in lumen of bladder despite no instrumentation, hx UTI's, current dysuria, we will complete treatment for cystitis, continue ceftriaxone . Note UA without pyuria or bacteriuria, but is nitrite positive, so micro may have missed some bacteria.   Consultants: None Procedures performed: None  Disposition: Home Diet recommendation: As tolerated DISCHARGE MEDICATION: Allergies as of 07/02/2024       Reactions   Oxybutynin Anaphylaxis   Pt states it made her mouth swell    Morphine  And Codeine Other (See Comments)   Hallunications, confusion for 1 month        Medication List     TAKE these medications    acetaminophen  500 MG tablet Commonly known as: TYLENOL  Take 1,000 mg by mouth every 6 (six) hours as needed for moderate pain or headache.   aspirin  EC 81 MG tablet Take 81 mg by mouth at bedtime.   cephALEXin  500 MG capsule Commonly known as: KEFLEX  Take 1 capsule (500 mg total) by mouth 2 (two) times daily for 5 days.   cyanocobalamin 1000 MCG/ML injection Commonly known as: VITAMIN B12 Inject 1,000 mcg into the muscle every 30 (thirty) days.   escitalopram  10 MG tablet Commonly known as: LEXAPRO  Take 15 mg by mouth daily with lunch.   gabapentin  100 MG capsule Commonly known as: NEURONTIN  Take 300 mg by mouth 2 (two) times daily.   Gemtesa 75 MG Tabs Generic drug: Vibegron Take 1 tablet by mouth daily.   levothyroxine  75 MCG tablet Commonly known as: SYNTHROID  Take 75 mcg by  mouth daily.   meloxicam 7.5 MG tablet Commonly known as: MOBIC Take 7.5 mg by mouth 2 (two) times daily.   methenamine 1 g tablet Commonly known as: HIPREX Take 1 g by mouth daily.   ondansetron  4 MG tablet Commonly known as: Zofran  Take 1 tablet (4 mg total) by mouth daily as needed for nausea or vomiting.   rosuvastatin  20 MG tablet Commonly known as: CRESTOR  Take 20 mg by mouth at bedtime.        Follow-up  Information     Sophronia Ozell BROCKS, MD Follow up.   Specialty: Family Medicine Contact information: [address] [place] [address] [phone]         Matilda Senior, MD Follow up.   Specialty: Urology Contact information: [address] [place] [address] [phone]                Discharge Exam: Andrea Norton   07/01/24 0056  Weight: 95.7 kg  BP (!) 164/82 (BP Location: Left Arm)   Pulse 69   Temp 98.4 F (36.9 C) (Oral)   Resp 16   Ht 5' 4 (1.626 m)   Wt 95.7 kg   SpO2 97%   BMI 36.21 kg/m   No distress, well-appearing female in no distress Soft, NT, ND, +BS Clear, nonlabored RRR  Condition at discharge: stable  The results of significant diagnostics from this hospitalization (including imaging, microbiology, ancillary and laboratory) are listed below for reference.   Imaging Studies: DG Abd 1 View Result Date: 07/01/2024 EXAM: 1 VIEW XRAY OF THE ABDOMEN 07/01/2024 02:06:00 PM COMPARISON: None available. CLINICAL HISTORY: SBO (small bowel obstruction) (HCC) FINDINGS: BOWEL: Suspicious mildly dilated central abdominal bowel loop up to 3.4 cm in diameter, similar to CT scan from earlier today. The appearance is otherwise nonspecific and could reflect ileus or early partial bowel obstruction. SOFT TISSUES: Surgical clips in right lower quadrant. Contrast medium in the urinary bladder. Calcified uterine fibroids. BONES: Dextroconvex lumbar scoliosis and degenerative changes consistent with spondylosis. Bilateral hip osteoarthritis. No acute fracture. IMPRESSION: 1. Mildly dilated central abdominal bowel loop up to 3.4 cm, similar to CT from earlier today, which could reflect ileus or early/partial small-bowel obstruction. 2. Degenerative changes including dextroconvex lumbar scoliosis and spondylosis, and bilateral hip osteoarthritis. 3. Several additional chronic or incidental findings are present, including right  lower quadrant surgical clips, contrast in the urinary bladder, and calcified uterine fibroids. Electronically signed by: Jabes Primo Salvage MD 07/01/2024 06:02 PM EST RP Workstation: [id number]   CT ABDOMEN PELVIS W CONTRAST Result Date: 07/01/2024 EXAM: CT ABDOMEN AND PELVIS WITH CONTRAST 07/01/2024 02:14:56 AM TECHNIQUE: CT of the abdomen and pelvis was performed with the administration of 100 mL of iohexol  (OMNIPAQUE ) 300 MG/ML solution. Multiplanar reformatted images are provided for review. Automated exposure control, iterative reconstruction, and/or weight-based adjustment of the mA/kV was utilized to reduce the radiation dose to as low as reasonably achievable. COMPARISON: None available. CLINICAL HISTORY: Abdominal pain, acute, nonlocalized. FINDINGS: LOWER CHEST: No acute abnormality. LIVER: Mild hepatic steatosis. No enhancing intrahepatic mass. GALLBLADDER AND BILE DUCTS: Gallbladder is unremarkable. No biliary ductal dilatation. SPLEEN: No acute abnormality. PANCREAS: No acute abnormality. ADRENAL GLANDS: No acute abnormality. KIDNEYS, URETERS AND BLADDER: Multiple simple parapelvic cysts are seen within the left kidney, for which no follow-up imaging is recommended. The kidneys are otherwise unremarkable. No stones in the kidneys or ureters. No hydronephrosis. No perinephric or periureteral stranding. Gas is seen non-dependently within the  bladder lumen, which is nonspecific and may relate to recent instrumentation. Less likely, this can be seen in the setting of aggressive infection and correlation with urinalysis and urine culture may be helpful for further management. GI AND BOWEL: Surgical changes of gastric sleeve resection are identified. Surgical changes of appendectomy and sigmoid colectomy are identified. There is circumferential bowel wall thickening with associated mesenteric edema involving a focal loop of distal small bowel within the right lower quadrant (series 54, image 2; series 46,  image 4), consistent with a focal area of infectious or inflammatory enteritis. There is a resultant partial small bowel obstruction with food-filled, mildly dilated proximal small bowel with fecalization of intraluminal contents in keeping with stasis. PERITONEUM AND RETROPERITONEUM: Mild ascites has developed. No free intraperitoneal gas. VASCULATURE: Aorta is normal in caliber. Extensive aortoiliac atherosclerotic calcifications. LYMPH NODES: No lymphadenopathy. REPRODUCTIVE ORGANS: Multiple calcifications in the uterine fundus are likely related to the presence of underlying involuted uterine fibroids. The pelvic organs are otherwise unremarkable. BONES AND SOFT TISSUES: Osseous structures are age-appropriate. No acute bone abnormality. No lytic or blastic bone lesion. Ventral hernia repair with mesh has been performed. Right lateral abdominal wall broad-based hernia is seen containing several loops of distal small bowel as well as the ascending colon. IMPRESSION: 1. Short segment distal small-bowel infectious or inflammatory enteritis resulting in partial small-bowel obstruction. Mild ascites. 2. Right lateral abdominal wall broad-based hernia containing several loops of unremarkable distal small bowel and the ascending colon. 3. Gas within the bladder lumen, nonspecific; consider urinalysis and urine culture to evaluate for infection if no recent instrumentation. 4. Mild hepatic steatosis. Electronically signed by: Dorethia Molt MD 07/01/2024 02:26 AM EST RP Workstation: [id number]    Microbiology: Results for orders placed or performed during the hospital encounter of 07/01/24  Urine Culture (for pregnant, neutropenic or urologic patients or patients with an indwelling urinary catheter)     Status: Abnormal (Preliminary result)   Collection Time: 07/01/24  2:32 AM   Specimen: Urine, Clean Catch  Result Value Ref Range Status   Specimen Description   Final    URINE, CLEAN CATCH Performed at [facility], [address] [place], [place] [address]    Special Requests   Final    NONE Performed at [facility], [address] [place], [place] [address]    Culture (A)  Final    >=100,000 COLONIES/mL ESCHERICHIA COLI SUSCEPTIBILITIES TO FOLLOW Performed at [facility], [address] [place], [place] [address]    Report Status PENDING  Incomplete    Labs: CBC: Recent Labs  Lab 07/01/24 0132 07/02/24 0434  WBC 7.1 5.6  NEUTROABS 5.4  --   HGB 13.3 11.3*  HCT 40.2 35.0*  MCV 87.6 88.8  PLT 214 200   Basic Metabolic Panel: Recent Labs  Lab 07/01/24 0132 07/02/24 0434  NA 140 142  K 3.7 3.9  CL 106 108  CO2 21* 30  GLUCOSE 116* 84  BUN 13 10  CREATININE 0.55 0.53  CALCIUM  9.4 8.6*  MG  --  2.0  PHOS  --  3.2   Liver Function Tests: Recent Labs  Lab 07/01/24 0132  AST 21  ALT 10  ALKPHOS 74  BILITOT 1.0  PROT 6.9  ALBUMIN 3.8   CBG: No results for input(s): GLUCAP in the last 168 hours.  Discharge time spent: greater than 30 minutes.  Signed: Bernardino KATHEE Come, MD [facility] 07/02/2024

## 2024-07-03 LAB — URINE CULTURE: Culture: >=100000 — AB

## 2024-07-15 ENCOUNTER — Encounter: Payer: Self-pay | Admitting: Internal Medicine

## 2024-08-01 ENCOUNTER — Telehealth: Payer: Self-pay | Admitting: Internal Medicine

## 2024-08-01 ENCOUNTER — Encounter: Payer: Self-pay | Admitting: Internal Medicine

## 2024-08-01 ENCOUNTER — Ambulatory Visit (INDEPENDENT_AMBULATORY_CARE_PROVIDER_SITE_OTHER): Admitting: Internal Medicine

## 2024-08-01 VITALS — BP 136/78 | HR 63 | Temp 98.4°F | Ht 64.0 in | Wt 179.6 lb

## 2024-08-01 DIAGNOSIS — K439 Ventral hernia without obstruction or gangrene: Secondary | ICD-10-CM

## 2024-08-01 DIAGNOSIS — R197 Diarrhea, unspecified: Secondary | ICD-10-CM

## 2024-08-01 DIAGNOSIS — K56600 Partial intestinal obstruction, unspecified as to cause: Secondary | ICD-10-CM

## 2024-08-01 DIAGNOSIS — R194 Change in bowel habit: Secondary | ICD-10-CM | POA: Diagnosis not present

## 2024-08-01 DIAGNOSIS — R140 Abdominal distension (gaseous): Secondary | ICD-10-CM | POA: Diagnosis not present

## 2024-08-01 DIAGNOSIS — R109 Unspecified abdominal pain: Secondary | ICD-10-CM | POA: Diagnosis not present

## 2024-08-01 NOTE — Telephone Encounter (Signed)
 Per Dr. Cindie he has spoken with the patient.

## 2024-08-01 NOTE — Telephone Encounter (Signed)
 Dr. Cindie made aware the patient returned the missed call.

## 2024-08-01 NOTE — Progress Notes (Signed)
 "   Primary Care Physician:  Shepperson, Kirstin, PA-C Primary Gastroenterologist:  Dr. Cindie  Chief Complaint  Patient presents with   New Patient (Initial Visit)    Patient here today as a hospital follow up from 07/01/2024 due to  Partial small bowel obstruction. Patient has some RUQ into Rflank pain. Patient report she has mesh in that side from a hernia repair, she does have occasional issues with gas,  patient denies any other current gi related issues.    HPI:   Andrea Norton is a 79 y.o. female who presents to clinic today for hospital follow-up visit.  Partial small bowel obstruction: [facility]ed [facility] 07/01/2024 after sudden onset of severe abdominal pain, diarrhea, nausea and vomiting.  CT abdomen pelvis with contrast which I personally reviewed showed short segment distal small bowel infectious or inflammatory enteritis resulting in partial bowel obstruction.  Right lateral abdominal wall broad-based hernia containing several loops of unremarkable distal small bowel and the ascending colon. Patient was treated conservatively with IV antibiotics and discharged home.  Today she reports rapid improvement in her symptoms.  Only took a few days for her to get back to her baseline.  Overall feeling better from the standpoint.  Colonoscopy 10/23/2020, terminal ileum appeared grossly normal.  Sigmoid diverticulosis present.  One 5 mm rectal polyp benign on pathology.  Chronic abdominal pain, abdominal bloating, change in bowels: Patient notes chronic right sided abdominal pain in the location of her large ventral hernia.  Also notes significant bloating especially after meals.  Chronically loose stools.  She states she averages 1-2 bowel movements a day.  She often has to push in her hernia to have a bowel movement.  History of ventral hernia repair 2017.  Past Medical History:  Diagnosis Date   Anxiety    Arthritis    HANDS, KNEES   B12 deficiency anemia    Chronic  bronchitis (HCC)    Coronary artery disease 2005   DDD (degenerative disc disease), cervical    DDD (degenerative disc disease), lumbar    Depression    states well controlled   Diverticulitis of colon    GERD (gastroesophageal reflux disease)    H/O hiatal hernia    Hemorrhoid    Hyperlipidemia    Hypertension      PCP Dr Wells; pt currently on no medication for treatment    Hypothyroidism    Pneumonia    hx of walking pneumonia   Sleep apnea    couldnt tolerate machine- states last sleep study  many years ago   Status post primary angioplasty with coronary stent    DRUG-ELUTING STENT X1  TO PROXIMAL LAD   Synovitis of knee HYPERTROPHIC RIGHT KNEE    Past Surgical History:  Procedure Laterality Date   APPENDECTOMY  1993   CATARACT EXTRACTION W/PHACO Right 05/06/2014   Procedure: CATARACT EXTRACTION PHACO AND INTRAOCULAR LENS PLACEMENT (IOC);  Surgeon: Oneil T. Roz, MD;  Location: [facility];  Service: Ophthalmology;  Laterality: Right;  CDE 6.41   CATARACT EXTRACTION W/PHACO Left 05/20/2014   Procedure: CATARACT EXTRACTION PHACO AND INTRAOCULAR LENS PLACEMENT (IOC);  Surgeon: Oneil T. Roz, MD;  Location: [facility];  Service: Ophthalmology;  Laterality: Left;  CDE:4.42   COLON SURGERY     CORONARY ANGIOPLASTY  2004   DRUG-ELUTING STENT X1 TO PROXIMAL  LAD   CYSTOSCOPY WITH STENT PLACEMENT Right 04/14/2018   Procedure: CYSTOSCOPY WITH STENT PLACEMENT;  Surgeon: Matilda Senior, MD;  Location: [facility];  Service: Urology;  Laterality: Right;   EXCISIONAL TOTAL KNEE ARTHROPLASTY WITH ANTIBIOTIC SPACERS Right 09/14/2015   Procedure: RIGHT TOTAL KNEE RESSECTION ARTHROPLASTY WITH ANTIBIOTIC SPACERS;  Surgeon: Dempsey Moan, MD;  Location: [facility];  Service: Orthopedics;  Laterality: Right;   EYE SURGERY     HEMICOLECTOMY FOR DIVERTICULITIS  2005   HYSTEROSCOPY WITH D & C  2002   I & D KNEE WITH POLY EXCHANGE  02/01/2012   Procedure: IRRIGATION AND DEBRIDEMENT KNEE WITH POLY  EXCHANGE;  Surgeon: Dempsey LULLA Moan, MD;  Location: [facility];  Service: Orthopedics;  Laterality: Right;  Right Knee Polyethlene Revision    IR URETERAL STENT RIGHT NEW ACCESS W/O SEP NEPHROSTOMY CATH  06/01/2018   IRRIGATION AND DEBRIDEMENT KNEE Right 03/10/2015   Procedure: IRRIGATION AND DEBRIDEMENT of right KNEE;  Surgeon: Dempsey Moan, MD;  Location: [facility];  Service: Orthopedics;  Laterality: Right;   JOINT REPLACEMENT     right knee    KNEE ARTHROSCOPY  LEFT 1999   RIGHT 2004   KNEE ARTHROSCOPY  08/03/2011   Procedure: ARTHROSCOPY KNEE;  Surgeon: Dempsey LULLA Moan;  Location: [facility];  Service: Orthopedics;  Laterality: Right;   KNEE ARTHROSCOPY Right 12/17/2014   Procedure: RIGHT ARTHROSCOPY KNEE, SYNOVECTOMY;  Surgeon: Dempsey Moan, MD;  Location: [facility];  Service: Orthopedics;  Laterality: Right;   KNEE ARTHROSCOPY W/ DEBRIDEMENT  01-22-2010   SEPTIC KNEE   KNEE CLOSED REDUCTION  05-07-2009  RIGHT KNEE   LEFT KNEE  10-01-2009   LAPAROSCOPIC GASTRIC SLEEVE RESECTION N/A 07/21/2014   Procedure: LAPAROSCOPIC GASTRIC SLEEVE RESECTION, TAKEDOWN OF INCARCERATED VENTRAL HERNIA, ENTERAL LYSIS, UPPER ENDOSCOPY;  Surgeon: Donnice KATHEE Lunger, MD;  Location: [facility];  Service: General;  Laterality: N/A;   LEFT FOOT SURG.  1990   NEPHROLITHOTOMY Right 06/04/2018   Procedure: NEPHROLITHOTOMY PERCUTANEOUS;  Surgeon: Matilda Senior, MD;  Location: [facility];  Service: Urology;  Laterality: Right;  2.5 HRS   OPEN PATELLOFEMORAL RIGHT KNEE/ LATERAL RELEASE  05-04-2009   QUADRICEPS TENDON REPAIR Right 06/02/2015   Procedure: RIGHT KNEE QUADRICEP TENDON REPAIR ;  Surgeon: Dempsey Moan, MD;  Location: [facility];  Service: Orthopedics;  Laterality: Right;   RIGHT KNEE I & D POLYTHYLENE REVISION  02-04-2010   KNEE INFECTED   SYNOVECTOMY  08/03/2011   Procedure: SYNOVECTOMY;  Surgeon: Dempsey LULLA Aluisio;  Location: [facility];  Service: Orthopedics;  Laterality: Right;   TOTAL KNEE  ARTHROPLASTY  LEFT  2005   RIGHT  03-13-2009   TOTAL KNEE ARTHROPLASTY Right 11/23/2015   Procedure: RIGHT TOTAL KNEE ARTHROPLASTY REIMPLANTATION;  Surgeon: Dempsey Moan, MD;  Location: [facility];  Service: Orthopedics;  Laterality: Right;   TOTAL KNEE REVISION  RIGHT 09-30-2009   LEFT 11-25-2009   TOTAL KNEE REVISION Right 02/20/2013   Procedure: RIGHT TOTAL KNEE ARTHROPLASTY REVISION VERSUS RESECTION ARTHROPLASTY;  Surgeon: Dempsey LULLA Moan, MD;  Location: [facility];  Service: Orthopedics;  Laterality: Right;   TOTAL KNEE REVISION Right 04/02/2014   Procedure: RIGHT KNEE FEMORAL ARTHROPLASTY REVISION;  Surgeon: Dempsey Moan LULLA, MD;  Location: [facility];  Service: Orthopedics;  Laterality: Right;    Current Outpatient Medications  Medication Sig Dispense Refill   acetaminophen  (TYLENOL ) 500 MG tablet Take 1,000 mg by mouth every 6 (six) hours as needed for moderate pain or headache.      aspirin  EC 81 MG tablet Take 81 mg by mouth at bedtime.      cyanocobalamin (,VITAMIN B-12,)  1000 MCG/ML injection Inject 1,000 mcg into the muscle every 30 (thirty) days.      escitalopram  (LEXAPRO ) 10 MG tablet Take 15 mg by mouth daily with lunch.      gabapentin  (NEURONTIN ) 100 MG capsule Take 300 mg by mouth 2 (two) times daily.      levothyroxine  (SYNTHROID ) 75 MCG tablet Take 75 mcg by mouth daily.     meloxicam (MOBIC) 7.5 MG tablet Take 7.5 mg by mouth 2 (two) times daily.      methenamine (HIPREX) 1 g tablet Take 1 g by mouth daily.     ondansetron  (ZOFRAN ) 4 MG tablet Take 1 tablet (4 mg total) by mouth daily as needed for nausea or vomiting. 20 tablet 0   OVER THE COUNTER MEDICATION Vit K2 w D3 125 mcg daily. Vit C 500 mg daily.     rosuvastatin  (CRESTOR ) 20 MG tablet Take 20 mg by mouth at bedtime.     No current facility-administered medications for this visit.   Facility-Administered Medications Ordered in Other Visits  Medication Dose Route Frequency Provider Last Rate Last Admin   tranexamic acid   (CYKLOKAPRON ) 2,000 mg in sodium chloride  0.9 % 50 mL Topical Application  2,000 mg Topical Once Porterfield, Amber, PA-C        Allergies as of 08/01/2024 - Review Complete 08/01/2024  Allergen Reaction Noted   Oxybutynin Anaphylaxis 06/01/2018   Morphine  and codeine Other (See Comments) 02/12/2013    Family History  Problem Relation Age of Onset   COPD Mother    Cancer Father        lung    Social History   Socioeconomic History   Marital status: Widowed    Spouse name: Not on file   Number of children: Not on file   Years of education: Not on file   Highest education level: Not on file  Occupational History   Not on file  Tobacco Use   Smoking status: Some Days    Current packs/day: 0.00    Average packs/day: 1 pack/day for 30.0 years (30.0 ttl pk-yrs)    Types: Cigarettes    Start date: 07/31/1972    Last attempt to quit: 07/31/2002    Years since quitting: 22.0   Smokeless tobacco: Never  Vaping Use   Vaping status: Never Used  Substance and Sexual Activity   Alcohol use: No   Drug use: No   Sexual activity: Not on file  Other Topics Concern   Not on file  Social History Narrative   Not on file   Social Drivers of Health   Tobacco Use: High Risk (08/01/2024)   Patient History    Smoking Tobacco Use: Some Days    Smokeless Tobacco Use: Never    Passive Exposure: Not on file  Financial Resource Strain: Low Risk (07/09/2024)   Received from [facility]   Overall Financial Resource Strain (CARDIA)    How hard is it for you to pay for the very basics like food, housing, medical care, and heating?: Not very hard  Food Insecurity: No Food Insecurity (07/09/2024)   Received from [facility]   Epic    Within the past 12 months, you worried that your food would run out before you got the money to buy more.: Never true    Within the past 12 months, the food you bought just didn't last and you didn't have money to get more.: Never true  Transportation Needs: No  Transportation Needs (07/09/2024)  Received from [facility]    In the past 12 months, has lack of transportation kept you from medical appointments or from getting medications?: No    In the past 12 months, has lack of transportation kept you from meetings, work, or from getting things needed for daily living?: No  Physical Activity: Inactive (07/09/2024)   Received from [facility]   Exercise Vital Sign    On average, how many days per week do you engage in moderate to strenuous exercise (like a brisk walk)?: 0 days    Minutes of Exercise per Session: Not on file  Stress: No Stress Concern Present (07/09/2024)   Received from [facility] - Occupational Stress Questionnaire    Do you feel stress - tense, restless, nervous, or anxious, or unable to sleep at night because your mind is troubled all the time - these days?: Only a little  Social Connections: Socially Integrated (07/09/2024)   Received from [facility]   Social Network    How would you rate your social network (family, work, friends)?: Good participation with social networks  Intimate Partner Violence: Not At Risk (07/09/2024)   Received from [facility]   HITS    Over the last 12 months how often did your partner physically hurt you?: Never    Over the last 12 months how often did your partner insult you or talk down to you?: Never    Over the last 12 months how often did your partner threaten you with physical harm?: Never    Over the last 12 months how often did your partner scream or curse at you?: Never  Depression (PHQ2-9): Not on file  Alcohol Screen: Not on file  Housing: Low Risk (07/09/2024)   Received from [facility]    In the last 12 months, was there a time when you were not able to pay the mortgage or rent on time?: No    In the past 12 months, how many times have you moved where you were living?: 0    At any time in the past 12 months,  were you homeless or living in a shelter (including now)?: No  Utilities: Not At Risk (07/09/2024)   Received from [facility]    In the past 12 months has the electric, gas, oil, or water  company threatened to shut off services in your home?: No  Health Literacy: Low Risk (03/17/2023)   Received from [facility]    How often do you need to have someone help you when you read instructions, pamphlets, or other written material from your doctor or pharmacy?: Never    Subjective: Review of Systems  Constitutional:  Negative for chills and fever.  HENT:  Negative for congestion and hearing loss.   Eyes:  Negative for blurred vision and double vision.  Respiratory:  Negative for cough and shortness of breath.   Cardiovascular:  Negative for chest pain and palpitations.  Gastrointestinal:  Positive for diarrhea. Negative for abdominal pain, blood in stool, constipation, heartburn, melena and vomiting.       Abdominal bloating  Genitourinary:  Negative for dysuria and urgency.  Musculoskeletal:  Negative for joint pain and myalgias.  Skin:  Negative for itching and rash.  Neurological:  Negative for dizziness and headaches.  Psychiatric/Behavioral:  Negative for depression. The patient is not nervous/anxious.  Objective: BP 136/78 (BP Location: Left Arm, Patient Position: Sitting, Cuff Size: Normal)   Pulse 63   Temp 98.4 F (36.9 C) (Temporal)   Ht 5' 4 (1.626 m)   Wt 179 lb 9.6 oz (81.5 kg)   BMI 30.83 kg/m  Physical Exam Constitutional:      Appearance: Normal appearance.  HENT:     Head: Normocephalic and atraumatic.  Eyes:     Extraocular Movements: Extraocular movements intact.     Conjunctiva/sclera: Conjunctivae normal.  Cardiovascular:     Rate and Rhythm: Normal rate and regular rhythm.  Pulmonary:     Effort: Pulmonary effort is normal.     Breath sounds: Normal breath sounds.  Abdominal:     General: Bowel sounds are  normal.     Palpations: Abdomen is soft.  Musculoskeletal:        General: No swelling. Normal range of motion.     Cervical back: Normal range of motion and neck supple.  Skin:    General: Skin is warm and dry.     Coloration: Skin is not jaundiced.  Neurological:     General: No focal deficit present.     Mental Status: She is alert and oriented to person, place, and time.  Psychiatric:        Mood and Affect: Mood normal.        Behavior: Behavior normal.      Assessment/Plan:  1.  Partial small bowel obstruction-likely due to distal enteritis which has since resolved with antibiotics.  Discussed potential causes.  Given her rapid improvement, likely infectious enteritis.  I doubt this is a new diagnosis of Crohn's disease.  Relatively recent colonoscopy 3 years ago.  Will continue to monitor for now.  If this happens again, we may need to consider colonoscopy with ileal intubation to further evaluate.  2.  Abdominal bloating, change in bowels, abdominal pain-reviewed patient's CT scan in depth with her today.  I think a lot of her discomfort and bloating is from her large ventral hernia.  Complex surgical history.  Discussed with general surgeon here locally who recommended referral to Dr. Allyne at [facility] if surgical fixation were to be pursued.  Patient would like to continue to monitor for now.    Will readdress on follow-up visit in 3 months.   08/01/2024 1:29 PM    "

## 2024-08-01 NOTE — Patient Instructions (Signed)
 The cause of your partial bowel obstruction was likely due to infectious enteritis.  This seems to have resolved.  I think we can continue to monitor for now.  If it happens again then we may need to consider updating your colonoscopy.  I have reached out to 1 of the general surgeons here locally in regards to your large ventral hernia which I think is causing a lot of your chronic GI issues.  Once I have heard back from him I will call you and let you know.  Otherwise follow-up in 3 to 4 months.  It was very nice meeting you both today.  Dr. Cindie

## 2024-08-01 NOTE — Telephone Encounter (Signed)
 Patient called to say her phone was on mute. She was seen earlier today and said Dr Cindie was going to call her and if he did to please call her back. [phone]
